# Patient Record
Sex: Female | Born: 1950 | Race: White | Hispanic: No | Marital: Married | Smoking: Former smoker
Health system: Southern US, Community
[De-identification: ages and names within clinical notes are randomized; demographics above are authoritative.]

## PROBLEM LIST (undated history)

## (undated) DIAGNOSIS — I219 Acute myocardial infarction, unspecified: Secondary | ICD-10-CM

## (undated) DIAGNOSIS — I509 Heart failure, unspecified: Secondary | ICD-10-CM

## (undated) DIAGNOSIS — E559 Vitamin D deficiency, unspecified: Secondary | ICD-10-CM

## (undated) DIAGNOSIS — E538 Deficiency of other specified B group vitamins: Secondary | ICD-10-CM

## (undated) DIAGNOSIS — D649 Anemia, unspecified: Secondary | ICD-10-CM

## (undated) DIAGNOSIS — E785 Hyperlipidemia, unspecified: Secondary | ICD-10-CM

## (undated) DIAGNOSIS — T7840XA Allergy, unspecified, initial encounter: Secondary | ICD-10-CM

## (undated) DIAGNOSIS — F419 Anxiety disorder, unspecified: Secondary | ICD-10-CM

## (undated) DIAGNOSIS — M1990 Unspecified osteoarthritis, unspecified site: Secondary | ICD-10-CM

## (undated) DIAGNOSIS — D499 Neoplasm of unspecified behavior of unspecified site: Secondary | ICD-10-CM

## (undated) DIAGNOSIS — I5032 Chronic diastolic (congestive) heart failure: Secondary | ICD-10-CM

## (undated) DIAGNOSIS — G43909 Migraine, unspecified, not intractable, without status migrainosus: Secondary | ICD-10-CM

## (undated) DIAGNOSIS — R011 Cardiac murmur, unspecified: Secondary | ICD-10-CM

## (undated) DIAGNOSIS — G4730 Sleep apnea, unspecified: Secondary | ICD-10-CM

## (undated) DIAGNOSIS — K219 Gastro-esophageal reflux disease without esophagitis: Secondary | ICD-10-CM

## (undated) DIAGNOSIS — K449 Diaphragmatic hernia without obstruction or gangrene: Secondary | ICD-10-CM

## (undated) DIAGNOSIS — E119 Type 2 diabetes mellitus without complications: Secondary | ICD-10-CM

## (undated) DIAGNOSIS — C679 Malignant neoplasm of bladder, unspecified: Secondary | ICD-10-CM

## (undated) DIAGNOSIS — I2510 Atherosclerotic heart disease of native coronary artery without angina pectoris: Secondary | ICD-10-CM

## (undated) DIAGNOSIS — H269 Unspecified cataract: Secondary | ICD-10-CM

## (undated) DIAGNOSIS — I10 Essential (primary) hypertension: Secondary | ICD-10-CM

## (undated) DIAGNOSIS — M7520 Bicipital tendinitis, unspecified shoulder: Secondary | ICD-10-CM

## (undated) DIAGNOSIS — M75100 Unspecified rotator cuff tear or rupture of unspecified shoulder, not specified as traumatic: Secondary | ICD-10-CM

## (undated) DIAGNOSIS — R7989 Other specified abnormal findings of blood chemistry: Secondary | ICD-10-CM

## (undated) HISTORY — DX: Chronic diastolic (congestive) heart failure: I50.32

## (undated) HISTORY — PX: GASTRIC BYPASS: SHX52

## (undated) HISTORY — DX: Hyperlipidemia, unspecified: E78.5

## (undated) HISTORY — PX: DG  BONE DENSITY (ARMC HX): HXRAD1102

## (undated) HISTORY — DX: Malignant neoplasm of bladder, unspecified: C67.9

## (undated) HISTORY — DX: Type 2 diabetes mellitus without complications: E11.9

## (undated) HISTORY — DX: Other specified abnormal findings of blood chemistry: R79.89

## (undated) HISTORY — PX: ENDOMETRIAL ABLATION: SHX621

## (undated) HISTORY — DX: Unspecified rotator cuff tear or rupture of unspecified shoulder, not specified as traumatic: M75.100

## (undated) HISTORY — DX: Anxiety disorder, unspecified: F41.9

## (undated) HISTORY — DX: Bicipital tendinitis, unspecified shoulder: M75.20

## (undated) HISTORY — DX: Migraine, unspecified, not intractable, without status migrainosus: G43.909

## (undated) HISTORY — DX: Neoplasm of unspecified behavior of unspecified site: D49.9

## (undated) HISTORY — DX: Atherosclerotic heart disease of native coronary artery without angina pectoris: I25.10

## (undated) HISTORY — DX: Sleep apnea, unspecified: G47.30

## (undated) HISTORY — PX: MASTECTOMY: SHX3

## (undated) HISTORY — DX: Deficiency of other specified B group vitamins: E53.8

## (undated) HISTORY — PX: OTHER SURGICAL HISTORY: SHX169

## (undated) HISTORY — DX: Unspecified osteoarthritis, unspecified site: M19.90

## (undated) HISTORY — DX: Allergy, unspecified, initial encounter: T78.40XA

## (undated) HISTORY — PX: BREAST SURGERY: SHX581

## (undated) HISTORY — DX: Unspecified cataract: H26.9

## (undated) HISTORY — DX: Anemia, unspecified: D64.9

## (undated) HISTORY — PX: CARDIAC CATHETERIZATION: SHX172

## (undated) HISTORY — DX: Vitamin D deficiency, unspecified: E55.9

## (undated) HISTORY — DX: Diaphragmatic hernia without obstruction or gangrene: K44.9

## (undated) HISTORY — PX: CATARACT EXTRACTION: SUR2

## (undated) HISTORY — DX: Heart failure, unspecified: I50.9

## (undated) HISTORY — DX: Essential (primary) hypertension: I10

## (undated) HISTORY — DX: Gastro-esophageal reflux disease without esophagitis: K21.9

## (undated) HISTORY — DX: Cardiac murmur, unspecified: R01.1

---

## 1977-07-11 HISTORY — PX: CHOLECYSTECTOMY: SHX55

## 1988-07-11 DIAGNOSIS — C50919 Malignant neoplasm of unspecified site of unspecified female breast: Secondary | ICD-10-CM

## 1988-07-11 HISTORY — DX: Malignant neoplasm of unspecified site of unspecified female breast: C50.919

## 2012-07-11 DIAGNOSIS — C679 Malignant neoplasm of bladder, unspecified: Secondary | ICD-10-CM

## 2012-07-11 HISTORY — DX: Malignant neoplasm of bladder, unspecified: C67.9

## 2016-09-28 DIAGNOSIS — E559 Vitamin D deficiency, unspecified: Secondary | ICD-10-CM | POA: Insufficient documentation

## 2016-09-28 DIAGNOSIS — I2510 Atherosclerotic heart disease of native coronary artery without angina pectoris: Secondary | ICD-10-CM | POA: Insufficient documentation

## 2016-09-28 DIAGNOSIS — M75100 Unspecified rotator cuff tear or rupture of unspecified shoulder, not specified as traumatic: Secondary | ICD-10-CM | POA: Insufficient documentation

## 2016-09-28 DIAGNOSIS — E538 Deficiency of other specified B group vitamins: Secondary | ICD-10-CM | POA: Insufficient documentation

## 2016-09-28 DIAGNOSIS — M7520 Bicipital tendinitis, unspecified shoulder: Secondary | ICD-10-CM | POA: Insufficient documentation

## 2016-09-28 DIAGNOSIS — C679 Malignant neoplasm of bladder, unspecified: Secondary | ICD-10-CM | POA: Insufficient documentation

## 2016-09-28 DIAGNOSIS — R7989 Other specified abnormal findings of blood chemistry: Secondary | ICD-10-CM | POA: Insufficient documentation

## 2016-09-28 DIAGNOSIS — G43909 Migraine, unspecified, not intractable, without status migrainosus: Secondary | ICD-10-CM | POA: Insufficient documentation

## 2016-09-28 DIAGNOSIS — E785 Hyperlipidemia, unspecified: Secondary | ICD-10-CM | POA: Insufficient documentation

## 2018-02-19 LAB — PROTIME-INR: INR: 1 (ref 0.9–1.1)

## 2020-11-19 ENCOUNTER — Other Ambulatory Visit (HOSPITAL_COMMUNITY): Payer: Self-pay | Admitting: Obstetrics and Gynecology

## 2020-11-19 ENCOUNTER — Ambulatory Visit (HOSPITAL_COMMUNITY)
Admission: RE | Admit: 2020-11-19 | Discharge: 2020-11-19 | Disposition: A | Discharge status: Home / Self Care | Payer: Medicare PPO | Source: Ambulatory Visit | Attending: Obstetrics and Gynecology | Admitting: Obstetrics and Gynecology

## 2020-11-19 ENCOUNTER — Other Ambulatory Visit: Payer: Self-pay

## 2020-11-19 DIAGNOSIS — R609 Edema, unspecified: Secondary | ICD-10-CM

## 2020-11-19 NOTE — Progress Notes (Signed)
VASCULAR LAB    Left lower extremity venous duplex has been performed.  See CV proc for preliminary results.  Left Tiffany VM  Viliami Bracco, RVT 11/19/2020, 2:56 PM

## 2020-11-22 NOTE — Progress Notes (Deleted)
Cardiology Office Note:    Date:  11/22/2020   ID:  Heather Garrett, DOB March 14, 1951, MRN [id number]  PCP:  Pcp, No   [facility] Providers Cardiologist:  None {   Referring MD: No ref. provider found    History of Present Illness:    Heather Garrett is a 70 y.o. female with a hx of DMII, OSA, prior tobacco use who was previously followed in [facility] who now presents to clinic to establish care for her *****  No past medical history on file.  *** The histories are not reviewed yet. Please review them in the "History" navigator section and refresh this [place].  Current Medications: No outpatient medications have been marked as taking for the 11/25/20 encounter (Appointment) with [name], MD.     Allergies:   Patient has no allergy information on record.   Social History   Socioeconomic History  . Marital status: Married    Spouse name: Not on file  . Number of children: Not on file  . Years of education: Not on file  . Highest education level: Not on file  Occupational History  . Not on file  Tobacco Use  . Smoking status: Not on file  . Smokeless tobacco: Not on file  Substance and Sexual Activity  . Alcohol use: Not on file  . Drug use: Not on file  . Sexual activity: Not on file  Other Topics Concern  . Not on file  Social History Narrative  . Not on file   Social Determinants of Health   Financial Resource Strain: Not on file  Food Insecurity: Not on file  Transportation Needs: Not on file  Physical Activity: Not on file  Stress: Not on file  Social Connections: Not on file     Family History: The patient's ***family history is not on file.  ROS:   Please see the history of present illness.    *** All other systems reviewed and are negative.  EKGs/Labs/Other Studies Reviewed:    The following studies were reviewed today: ***  EKG:  EKG is *** ordered today.  The ekg ordered today demonstrates ***  Recent  Labs: No results found for requested labs within last 8760 hours.  Recent Lipid Panel No results found for: CHOL, TRIG, HDL, CHOLHDL, VLDL, LDLCALC, LDLDIRECT   Risk Assessment/Calculations:   {Does this patient have ATRIAL FIBRILLATION?:[id number]}   Physical Exam:    VS:  There were no vitals taken for this visit.    Wt Readings from Last 3 Encounters:  No data found for Wt     GEN: *** Well nourished, well developed in no acute distress HEENT: Normal NECK: No JVD; No carotid bruits LYMPHATICS: No lymphadenopathy CARDIAC: ***RRR, no murmurs, rubs, gallops RESPIRATORY:  Clear to auscultation without rales, wheezing or rhonchi  ABDOMEN: Soft, non-tender, non-distended MUSCULOSKELETAL:  No edema; No deformity  SKIN: Warm and dry NEUROLOGIC:  Alert and oriented x 3 PSYCHIATRIC:  Normal affect   ASSESSMENT:    No diagnosis found. PLAN:    In order of problems listed above:  1. ***   {Are you ordering a CV Procedure (e.g. stress test, cath, DCCV, TEE, etc)?   Press F2        :[id number]}    Medication Adjustments/Labs and Tests Ordered: Current medicines are reviewed at length with the patient today.  Concerns regarding medicines are outlined above.  No orders of the defined types were placed in this encounter.  No  orders of the defined types were placed in this encounter.   There are no Patient Instructions on file for this visit.   Signed, [name], MD  11/22/2020 4:50 PM    [facility]

## 2020-11-25 ENCOUNTER — Encounter: Payer: Self-pay | Admitting: Cardiology

## 2020-11-25 ENCOUNTER — Ambulatory Visit: Payer: Medicare PPO | Admitting: Cardiology

## 2020-11-25 ENCOUNTER — Other Ambulatory Visit: Payer: Self-pay

## 2020-11-25 VITALS — BP 146/82 | HR 65 | Ht <= 58 in | Wt 114.8 lb

## 2020-11-25 DIAGNOSIS — D649 Anemia, unspecified: Secondary | ICD-10-CM

## 2020-11-25 DIAGNOSIS — I2510 Atherosclerotic heart disease of native coronary artery without angina pectoris: Secondary | ICD-10-CM | POA: Diagnosis not present

## 2020-11-25 DIAGNOSIS — Z79899 Other long term (current) drug therapy: Secondary | ICD-10-CM

## 2020-11-25 DIAGNOSIS — R600 Localized edema: Secondary | ICD-10-CM | POA: Diagnosis not present

## 2020-11-25 DIAGNOSIS — E782 Mixed hyperlipidemia: Secondary | ICD-10-CM

## 2020-11-25 DIAGNOSIS — I10 Essential (primary) hypertension: Secondary | ICD-10-CM

## 2020-11-25 NOTE — Progress Notes (Signed)
Cardiology Office Note:    Date:  11/25/2020   ID:  Heather Garrett, DOB 1950/08/18, MRN [id number]  PCP:  Michael Boston, MD   [facility] Providers Cardiologist:  None {   Referring MD: No ref. provider found    History of Present Illness:    Heather Garrett is a 70 y.o. female with a hx of MI s/p PCI in 2001 (unknown artery), DMII, OSA, prior tobacco use who was previously followed in [facility] who now presents to clinic to establish care her known CAD and current worsening LE edema.  Patient states that she suffered a MI in 2001 s/p PCI to unknown artery. Since that time, she was been followed in [facility] with no recurrence of anginal symptoms. Had recent stress test and TTE which she believes were normal, however, records not brought today. She has no known diagnosis of CHF or hospitalizations for heart failure. Recently, she has been noting worsening bilateral LE edema. LE doppler ultrasound on 11/19/20 negative for DVT. She was switched off her lisinopril-HCTZ and started on lasix 20mg  qod. Despite starting the lasix, she has persistent LE edema.    Otherwise, she complains of intermittent lightheadedness at home. States her blood pressures are usually 120-140s. She had one episode a couple of months ago where her BP was 68/46 and she went to the OSH ED. She was told she was anemic and dehydrated and received IVF with improvement. No records available for this admission.  Currently she denies any pre-syncopal, or syncopal episodes. She denies any exertional chest tightness, pressure, or pain, palpitations. She has no PND or orthopnea.  Past Medical History:  Diagnosis Date  . Biceps tendinitis   . Cobalamin deficiency   . Coronary arteriosclerosis in native artery   . Elevated liver function tests   . Hyperlipidemia   . Migraine   . Rotator cuff syndrome   . 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 Vitamin D deficiency       Current  Medications: Current Meds  Medication Sig  . aspirin 81 MG EC tablet Take 40.5 mg by mouth every other day. Swallow whole.  . B Complex Vitamins (VITAMIN B COMPLEX PO) Take by mouth.  . carvedilol (COREG) 3.125 MG tablet Take 3.125 mg by mouth 2 (two) times daily.  . diclofenac (VOLTAREN) 75 MG EC tablet Take 75 mg by mouth as needed.  . etodolac (LODINE) 500 MG tablet Take 500 mg by mouth as needed.  . ezetimibe-simvastatin (VYTORIN) 10-80 MG tablet Take 1 tablet by mouth daily.  Marland Kitchen omeprazole (PRILOSEC) 20 MG capsule Take 20 mg by mouth daily.  . Probiotic Product (PROBIOTIC PO) Take by mouth.  . sertraline (ZOLOFT) 50 MG tablet Take 50 mg by mouth daily.  . SUMAtriptan (IMITREX) 50 MG tablet Take 50 mg by mouth every 2 (two) hours as needed for migraine. May repeat in 2 hours if headache persists or recurs.  Marland Kitchen tiZANidine (ZANAFLEX) 4 MG capsule Take 4 mg by mouth 3 (three) times daily.  Marland Kitchen topiramate (TOPAMAX) 50 MG tablet Take 50 mg by mouth 2 (two) times daily.  . traZODone (DESYREL) 50 MG tablet Take 50 mg by mouth at bedtime as needed for sleep.     Allergies:   Ciprofloxacin, Contrast media [iodinated diagnostic agents], Erythromycin base, and Sulfabenzamide   Social History   Socioeconomic History  . Marital status: Married    Spouse name: Not on file  . Number of children: Not  on file  . Years of education: Not on file  . Highest education level: Not on file  Occupational History  . Not on file  Tobacco Use  . Smoking status: Former Research scientist (life sciences)  . Smokeless tobacco: Never Used  . Tobacco comment: Quit 30 yrs ago  Substance and Sexual Activity  . Alcohol use: Not on file  . Drug use: Not on file  . Sexual activity: Not on file  Other Topics Concern  . Not on file  Social History Narrative  . Not on file   Social Determinants of Health   Financial Resource Strain: Not on file  Food Insecurity: Not on file  Transportation Needs: Not on file  Physical Activity: Not on  file  Stress: Not on file  Social Connections: Not on file     Family History: The patient's family history is not on file.  ROS:   Review of Systems  Constitutional: Negative for chills, diaphoresis, fever and malaise/fatigue.  HENT: Positive for congestion.   Eyes: Negative for blurred vision.  Respiratory: Negative for cough, shortness of breath and wheezing.   Cardiovascular: Positive for leg swelling (moderate). Negative for chest pain, palpitations, orthopnea and PND.  Gastrointestinal: Negative for abdominal pain, blood in stool, diarrhea, heartburn, nausea and vomiting.  Genitourinary: Negative for dysuria and hematuria.  Musculoskeletal: Positive for joint pain.  Neurological: Negative for dizziness and loss of consciousness.     EKGs/Labs/Other Studies Reviewed:    The following studies were reviewed today: 5/12/22Vascular [place]- Summary:  RIGHT:  - No evidence of common femoral vein obstruction.   LEFT:  - There is no evidence of deep vein thrombosis in the lower extremity.   EKG:   11/25/20-normal sinus rhythm, rate 65  Recent Labs: No results found for requested labs within last 8760 hours.  Recent Lipid Panel No results found for: CHOL, TRIG, HDL, CHOLHDL, VLDL, LDLCALC, LDLDIRECT   Physical Exam:    VS:  BP (!) 146/82   Pulse 65   Ht 4\' 10"  (1.473 m)   Wt 114 lb 12.8 oz (52.1 kg)   SpO2 97%   BMI 23.99 kg/m     Wt Readings from Last 3 Encounters:  11/25/20 114 lb 12.8 oz (52.1 kg)     GEN: Well nourished, well developed in no acute distress HEENT: Normal NECK: No JVD; No carotid bruits LYMPHATICS: No lymphadenopathy CARDIAC: RRR, 1/6 systolic murmur, no rubs or gallops RESPIRATORY:  Clear to auscultation without rales, wheezing or rhonchi  ABDOMEN: Soft, non-tender, non-distended MUSCULOSKELETAL: +2 pitting edema that extend to her chin(L>R) ; No deformity  SKIN: Warm and dry NEUROLOGIC:  Alert and oriented x 3 PSYCHIATRIC:  Normal affect    ASSESSMENT:    1. Coronary artery disease involving native coronary artery of native heart without angina pectoris   2. Anemia, unspecified type   3. Bilateral lower extremity edema   4. Medication management   5. Primary hypertension   6. Mixed hyperlipidemia    PLAN:    In order of problems listed above:  #Known CAD s/p remote MI with PCI in 2001: Per patient report, has followed with Cardiology closely with recent stress testing which was reassuringly normal. Has no anginal symptoms currently.  -Obtain records from Cardiologist; reportedly had TTE 09/2020 -Continue ASA 81mg  daily -Continue coreg 3.125mg  BID -Continue vytorin 10-80mg   -Will add back ACE/ARB as able pending renal function and blood pressure  #LE edema: Patient with progressive LE edema over the past couple of  months. Reportedly had TTE with Cardiologist in 09/2020 (unknown result). Has not been told she has heart failure in the past. Was started on lasix qod which has not helped signficantly. LE venous dopplers negative for DVT. -Obtain records from cardiologist including recent TTE as above -Check BMET and BNP today -Increase lasix to 20mg  daily for now; can adjust pending labs -Compression socks, leg elevation -Low Na diet  #HTN: Elevated today. Having episodes of dizziness however (? Orthostasis). States mainly 120-140s on home cuff. -Continue coreg 3.125mg  BID -Check home BP cuff measurements and add ACE/ARB if able; goal <120s/80s -Monitor for symptoms of orthostasis  #HLD: -Obtain lipids from Cardiologist office -Continue vytorin 10-80mg  daily as above  #Anemia: Patient reports that she was seen in the last couple of months in the ER with anemia and dehydration. No repeat labs in our system. -Check CBC today  Medication Adjustments/Labs and Tests Ordered: Current medicines are reviewed at length with the patient today.  Concerns regarding medicines are outlined above.  Orders Placed This  Encounter  Procedures  . Basic metabolic panel  . Pro b natriuretic peptide  . CBC  . EKG 12-Lead   No orders of the defined types were placed in this encounter.   Patient Instructions  Medication Instructions:   Your physician recommends that you continue on your current medications as directed. Please refer to the Current Medication list given to you today.  *If you need a refill on your cardiac medications before your next appointment, please call your pharmacy*   Lab Work:  TODAY--BMET, PRO-BNP, AND CBC  If you have labs (blood work) drawn today and your tests are completely normal, you will receive your results only by: Marland Kitchen MyChart Message (if you have MyChart) OR . A paper copy in the mail If you have any lab test that is abnormal or we need to change your treatment, we will call you to review the results.   Follow-Up:  2 MONTHS WITH AN EXTENDER IN THE OFFICE PER DR. Johney Frame        I,Alexis Bryant,acting as a scribe for [name], MD.,have documented all relevant documentation on the behalf of [name], MD,as directed by  [name], MD while in the presence of [name], MD.  I, [name], MD, have reviewed all documentation for this visit. The documentation on 11/25/20 for the exam, diagnosis, procedures, and orders are all accurate and complete. Signed, [name], MD  11/25/2020 12:44 PM    [facility]

## 2020-11-25 NOTE — Patient Instructions (Signed)
Medication Instructions:   Your physician recommends that you continue on your current medications as directed. Please refer to the Current Medication list given to you today.  *If you need a refill on your cardiac medications before your next appointment, please call your pharmacy*   Lab Work:  TODAY--BMET, PRO-BNP, AND CBC  If you have labs (blood work) drawn today and your tests are completely normal, you will receive your results only by: Marland Kitchen MyChart Message (if you have MyChart) OR . A paper copy in the mail If you have any lab test that is abnormal or we need to change your treatment, we will call you to review the results.   Follow-Up:  2 MONTHS WITH AN EXTENDER IN THE OFFICE PER DR. Johney Frame

## 2020-11-26 ENCOUNTER — Telehealth: Payer: Self-pay | Admitting: *Deleted

## 2020-11-26 DIAGNOSIS — Z79899 Other long term (current) drug therapy: Secondary | ICD-10-CM

## 2020-11-26 DIAGNOSIS — I2510 Atherosclerotic heart disease of native coronary artery without angina pectoris: Secondary | ICD-10-CM

## 2020-11-26 DIAGNOSIS — R7989 Other specified abnormal findings of blood chemistry: Secondary | ICD-10-CM

## 2020-11-26 LAB — BASIC METABOLIC PANEL
BUN/Creatinine Ratio: 15 (ref 12–28)
BUN: 12 mg/dL (ref 8–27)
CO2: 19 mmol/L — ABNORMAL LOW (ref 20–29)
Calcium: 8.6 mg/dL — ABNORMAL LOW (ref 8.7–10.3)
Chloride: 103 mmol/L (ref 96–106)
Creatinine, Ser: 0.78 mg/dL (ref 0.57–1.00)
Glucose: 104 mg/dL — ABNORMAL HIGH (ref 65–99)
Potassium: 4.4 mmol/L (ref 3.5–5.2)
Sodium: 136 mmol/L (ref 134–144)
eGFR: 82 mL/min/{1.73_m2} (ref >59)

## 2020-11-26 LAB — CBC
Hematocrit: 33.9 % — ABNORMAL LOW (ref 34.0–46.6)
Hemoglobin: 10.9 g/dL — ABNORMAL LOW (ref 11.1–15.9)
MCH: 31.2 pg (ref 26.6–33.0)
MCHC: 32.2 g/dL (ref 31.5–35.7)
MCV: 97 fL (ref 79–97)
Platelets: 180 10*3/uL (ref 150–450)
RBC: 3.49 x10E6/uL — ABNORMAL LOW (ref 3.77–5.28)
RDW: 14.2 % (ref 11.7–15.4)
WBC: 5.6 10*3/uL (ref 3.4–10.8)

## 2020-11-26 LAB — PRO B NATRIURETIC PEPTIDE: NT-Pro BNP: 1702 pg/mL — ABNORMAL HIGH (ref 0–301)

## 2020-11-26 MED ORDER — FUROSEMIDE 20 MG PO TABS: ORAL_TABLET | ORAL | 0 refills | Status: DC

## 2020-11-26 NOTE — Telephone Encounter (Signed)
Spoke with the Heather Garrett and endorsed to her, her lab results and recommendations per Dr. Johney Frame.  Informed the Heather Garrett that Dr. Johney Frame would like for her to increase her lasix to 40 mg po daily for 7 days only, then take 20 mg po daily thereafter.  Informed her that we need to bring her back in for lab in 10 days, to recheck a BMET.   Also informed the Heather Garrett that per Dr. Johney Frame, she is mildly anemic, which she has a history of, and she should get in with her PCP for further follow-up of this.  Confirmed the pharmacy of choice with the Heather Garrett.  Scheduled the Heather Garrett a repeat BMET on 12/08/20.  Per the Heather Garrett, she is requesting our female lab tech to draw her blood at that time.  She states when she had her previous lab drawn yesterday, female phlebotomist wrapped her arm too tight, causing a hematoma on her left AC. She states it is still very bruised in the area, but the swelling has gone down.  She states there is no obvious redness, streaking, or warmth in the area of her blood draw.  She is using ice as needed to help with the soreness.  She has full function of left extremity, and there is no obvious discoloration.  She would like for our female tech to draw her blood on 5/31, if possible.  Informed the Heather Garrett that I will request that in the lab appt notes.  Heather Garrett appreciative for this.  Heather Garrett verbalized understanding and agrees with this   Off note:  Records from [facility] received and handed to Dr. Johney Frame to review.

## 2020-11-26 NOTE — Telephone Encounter (Signed)
-----   Message from [name], MD sent at 11/26/2020  2:09 PM EDT ----- Can we increase her lasix to 40mg  daily for 7 days and then 20mg  daily thereafter? Repeat BMET in 10 days.   Otherwise her electrolytes and kidney function look good. She is mildly anemic and needs to follow-up with her PCP.   Did medical records ever get her documents from [facility]?

## 2020-11-27 NOTE — Telephone Encounter (Signed)
Below is where pt reported bruising to arm from previous blood draw done by our office on 5/18.  She is now mycharting  follow-up pictures to Dr. Johney Frame and myself to review.  Dr. Johney Frame did review picture and pt complaint of bruising to her left AC.  Called the pt this morning to follow-up on how her arm is since talking yesterday, and she said there is still bruising to her left AC, swelling and knot is slightly improved, but no pain in that area. Pt is on ASA daily. She still reports that there is NO redness, streaking, discoloration to left arm, hand, and fingers, and she is still able to bend her left arm with NO difficulty.  She states she has no pain in that arm.  She replies she has full function of her left arm.  She denies any chest pain, sob, DOE, or any other cardiac complaints.  Informed the pt that Dr. Johney Frame wants her to continue icing the area, elevating it at rest, and do NOT provide any more compression.  Advised the pt to keep an eye on her arm, and report any concerning symptoms immediately.  Pt education provided on alarming symptoms to look for and report.  Informed the pt that I will make our Nurse Supervisor aware of this issue.  Pt verbalized understanding and agrees with this plan.  Pt will have follow-up labs in our office 5/31, and already pre-planned when speaking to the pt yesterday, for our female Phlebotomist to draw her lab that day, and to draw from her hand only, as pt requested.  Pt still agrees to have lab drawn on that day.    Result note from 11/26/20: Spoke with the pt and endorsed to her, her lab results and recommendations per Dr. Johney Frame.  Informed the pt that Dr. Johney Frame would like for her to increase her lasix to 40 mg po daily for 7 days only, then take 20 mg po daily thereafter. Informed her that we need to bring her back in for lab in 10 days, to recheck a BMET.  Also informed the pt that per Dr. Johney Frame, she is mildly anemic, which she has a  history of, and she should get in with her PCP for further follow-up of this.  Confirmed the pharmacy of choice with the pt.  Scheduled the pt a repeat BMET on 12/08/20. Per the pt, she is requesting our female lab tech to draw her blood at that time.  She states when she had her previous lab drawn yesterday, female phlebotomist wrapped her arm too tight, causing a hematoma on her left AC. She states it is still very bruised in the area, but the swelling has gone down. She states there is no obvious redness, streaking, or warmth in the area of her blood draw. She is using ice as needed to help with the soreness. She has full function of left extremity, and there is no obvious discoloration. She would like for our female tech to draw her blood on 5/31, if possible. Informed the pt that I will request that in the lab appt notes. Pt appreciative for this.  Pt verbalized understanding and agrees with this

## 2020-12-08 ENCOUNTER — Other Ambulatory Visit: Payer: Medicare PPO | Admitting: *Deleted

## 2020-12-08 ENCOUNTER — Other Ambulatory Visit: Payer: Self-pay

## 2020-12-08 DIAGNOSIS — I2510 Atherosclerotic heart disease of native coronary artery without angina pectoris: Secondary | ICD-10-CM

## 2020-12-08 DIAGNOSIS — Z79899 Other long term (current) drug therapy: Secondary | ICD-10-CM

## 2020-12-08 DIAGNOSIS — R7989 Other specified abnormal findings of blood chemistry: Secondary | ICD-10-CM

## 2020-12-08 LAB — BASIC METABOLIC PANEL
BUN/Creatinine Ratio: 9 — ABNORMAL LOW (ref 12–28)
BUN: 8 mg/dL (ref 8–27)
CO2: 21 mmol/L (ref 20–29)
Calcium: 8.9 mg/dL (ref 8.7–10.3)
Chloride: 103 mmol/L (ref 96–106)
Creatinine, Ser: 0.85 mg/dL (ref 0.57–1.00)
Glucose: 96 mg/dL (ref 65–99)
Potassium: 4.1 mmol/L (ref 3.5–5.2)
Sodium: 142 mmol/L (ref 134–144)
eGFR: 74 mL/min/{1.73_m2} (ref >59)

## 2020-12-21 ENCOUNTER — Other Ambulatory Visit: Payer: Self-pay | Admitting: Internal Medicine

## 2020-12-21 DIAGNOSIS — R9389 Abnormal findings on diagnostic imaging of other specified body structures: Secondary | ICD-10-CM

## 2021-01-02 ENCOUNTER — Ambulatory Visit
Admission: RE | Admit: 2021-01-02 | Discharge: 2021-01-02 | Disposition: A | Discharge status: Home / Self Care | Payer: Medicare PPO | Source: Ambulatory Visit | Attending: Internal Medicine | Admitting: Internal Medicine

## 2021-01-02 ENCOUNTER — Other Ambulatory Visit: Payer: Self-pay

## 2021-01-02 DIAGNOSIS — R9389 Abnormal findings on diagnostic imaging of other specified body structures: Secondary | ICD-10-CM

## 2021-01-02 IMAGING — CT CT CHEST W/O CM
1 series · 15 of 34 positions shown, 19 images · non-contrast
Comparison: None.

CLINICAL DATA: Pulmonary nodule, breast cancer status post
mastectomy [IJ], renal cell carcinoma status post right partial
nephrectomy [IJ]

EXAM:
CT CHEST WITHOUT CONTRAST
TECHNIQUE: Multidetector CT imaging of the chest was performed following the
standard protocol without IV contrast.

[Series 2: chest w/(date) · axial · 0.61mm/px · z∈[+754,+988]mm · 15 of 139 slices shown, 19 images]
[im 11/139  mediastinal]
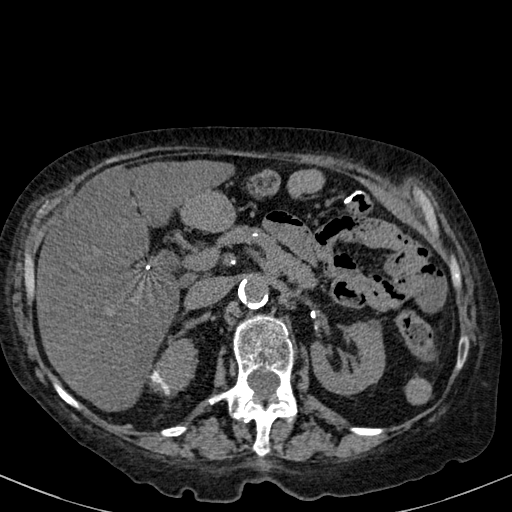
[im 11/139  lung]
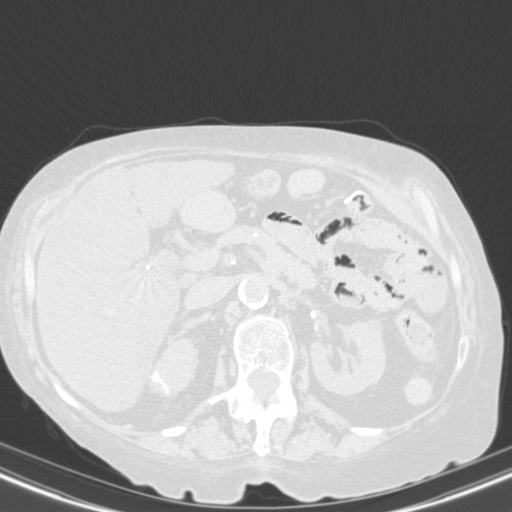
[im 21/139  lung]
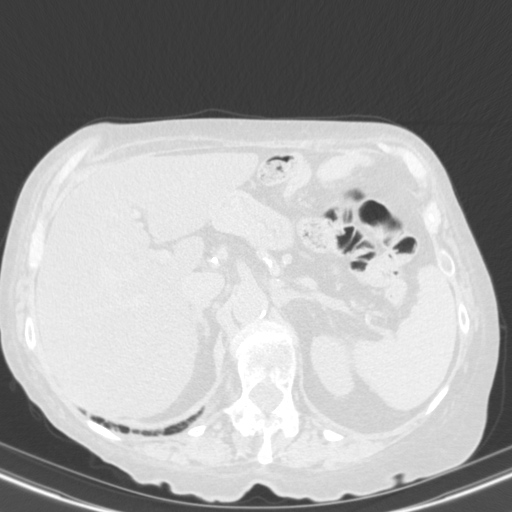
[im 28/139  lung]
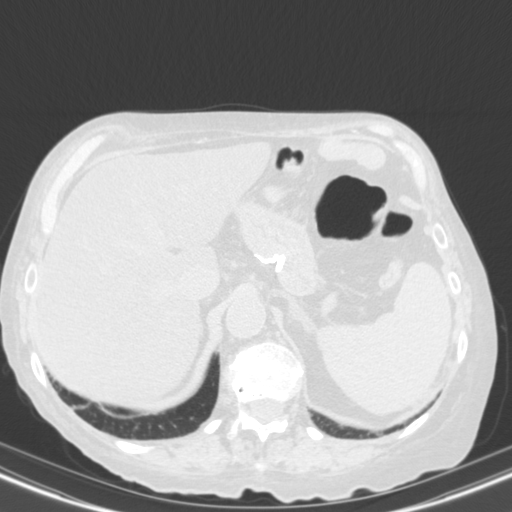
[im 36/139  lung]
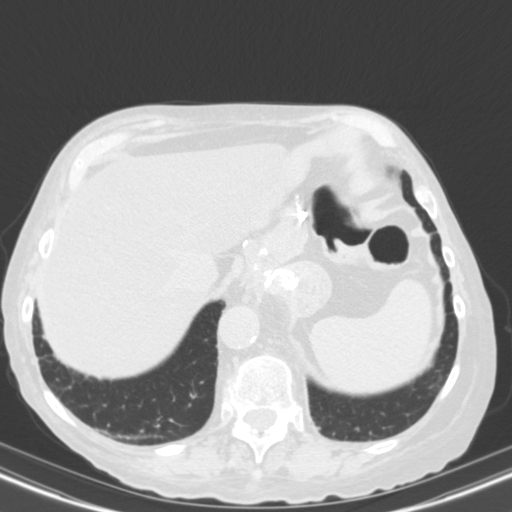
[im 47/139  mediastinal]
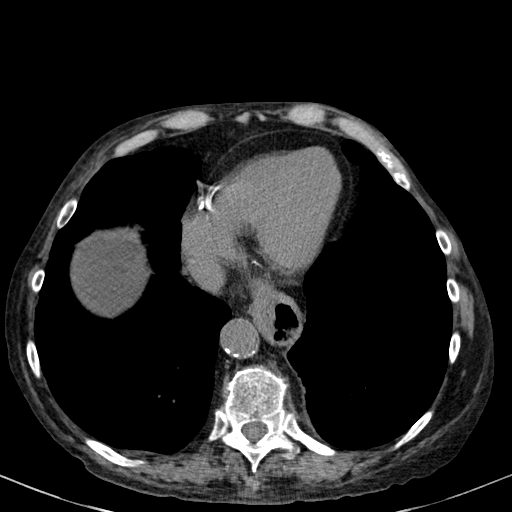
[im 47/139  lung]
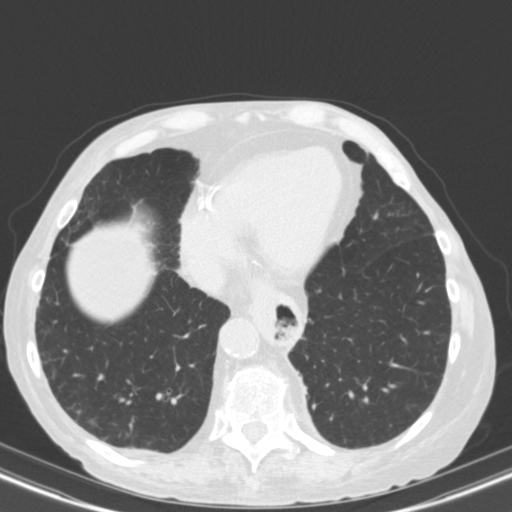
[im 56/139  lung]
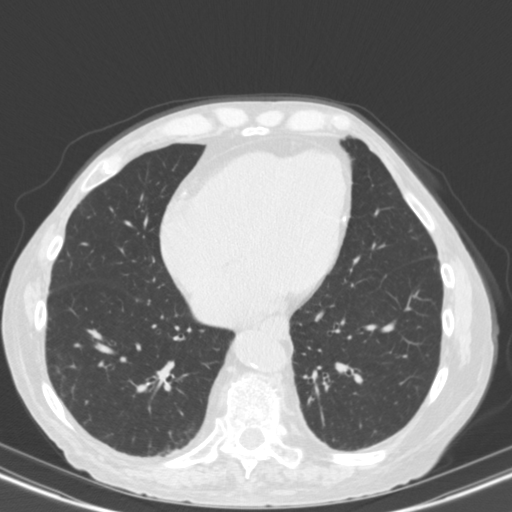
[im 62/139  lung]
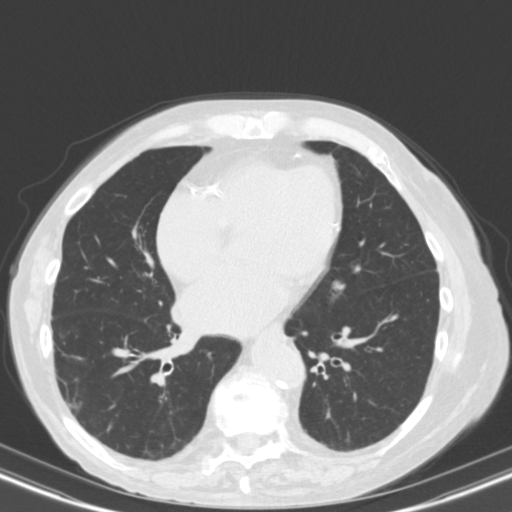
[im 72/139  lung]
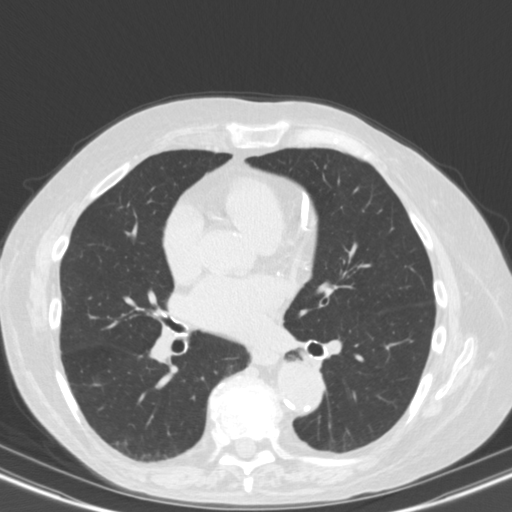
[im 77/139  mediastinal]
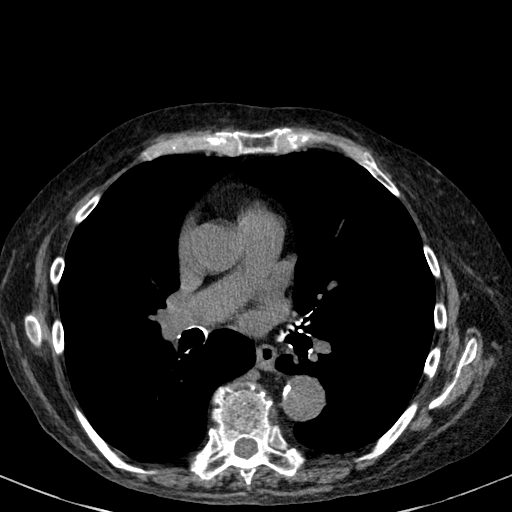
[im 77/139  lung]
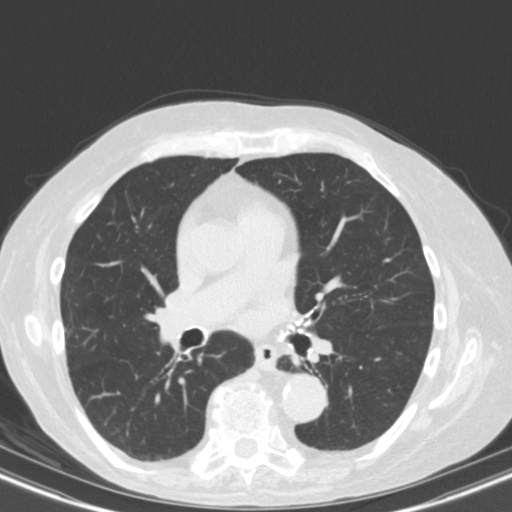
[im 83/139  lung]
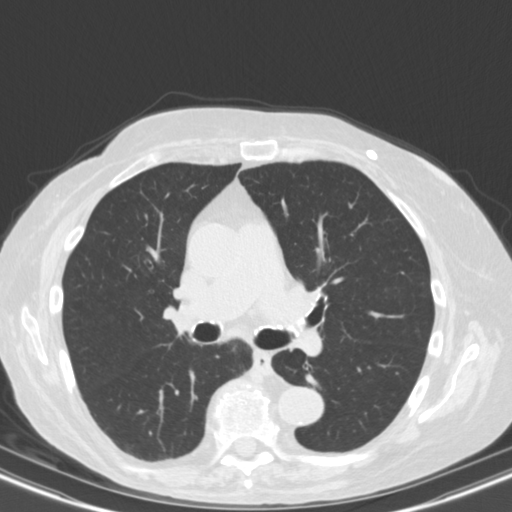
[im 93/139  lung]
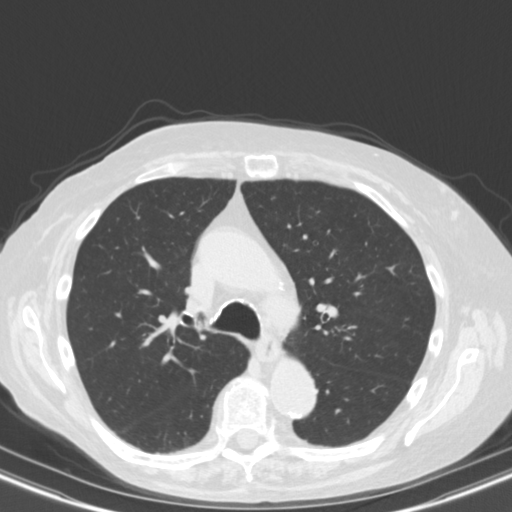
[im 103/139  lung]
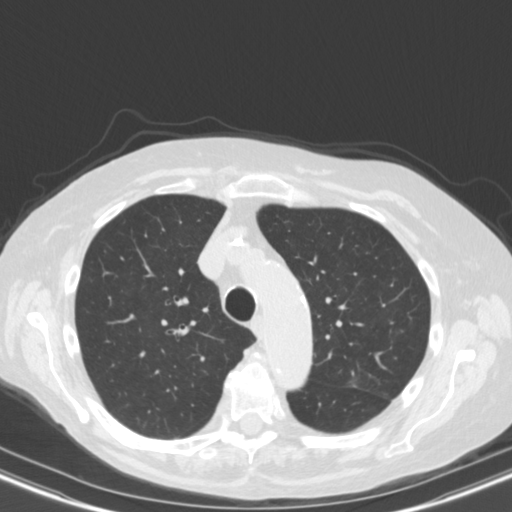
[im 111/139  mediastinal]
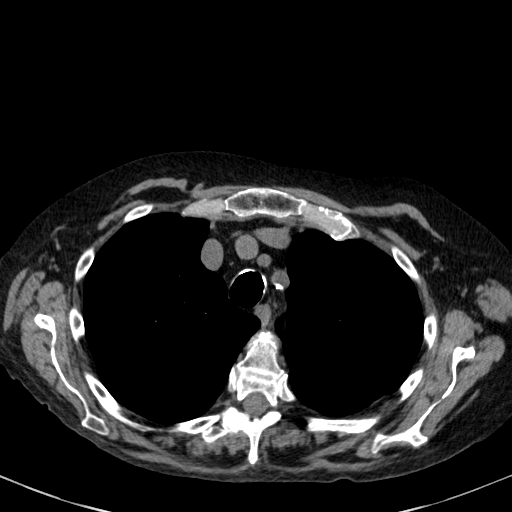
[im 111/139  lung]
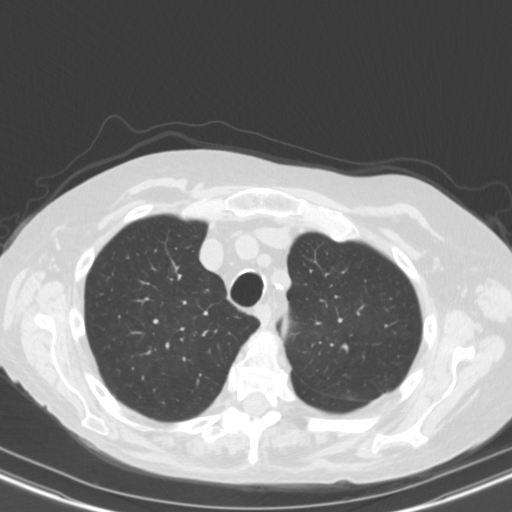
[im 118/139  lung]
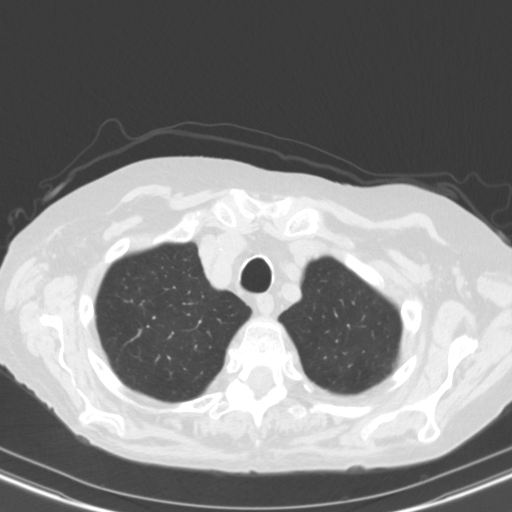
[im 128/139  lung]
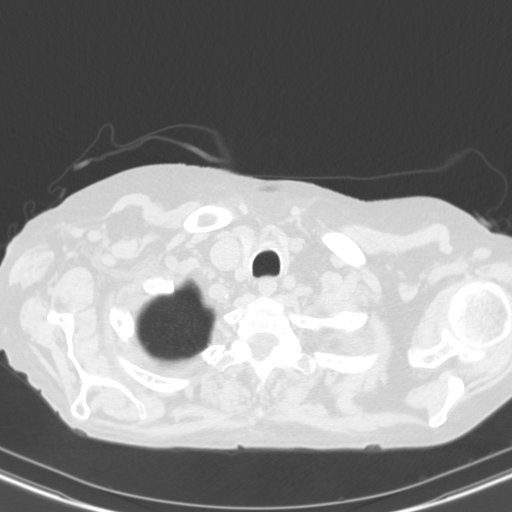

[15 of 34 positions shown; findings below may reference images not displayed]

FINDINGS: Cardiovascular: Extensive multi-vessel coronary artery
calcification. Global cardiac size within normal limits. No
pericardial effusion. The central pulmonary arteries are mildly
enlarged in keeping with changes of pulmonary arterial hypertension.
Moderate atherosclerotic calcification within the thoracic aorta. No
aortic aneurysm.

Mediastinum/Nodes: The visualized thyroid is unremarkable. No
pathologic thoracic adenopathy. Surgical changes of Roux-en-Y
gastric bypass are identified with herniation of the gastric pouch
into the thoracic compartment. The esophagus is unremarkable.

Lungs/Pleura: A 4 mm ground-glass pulmonary nodule is seen within
the superior segment of the left lower lobe, axial image # 42/5. 3
mm nodule within the a medial right lung base, axial image # 96,
demonstrates a benign calcification pattern and is compatible with a
tiny granuloma. Mild subpleural reticulation is seen within the
right lower lobe with parenchymal banding most in keeping with mild
fibrotic change. No other focal pulmonary nodules or infiltrates. No
pneumothorax or pleural effusion. The central airways are widely
patent.

Upper Abdomen: At least moderate hepatic steatosis. Cholecystectomy
has been performed. Partial resection of the upper pole the right
kidney noted. No acute abnormality.

Musculoskeletal: No lytic or blastic bone lesion. No acute bone
abnormality.
IMPRESSION: Extensive multi-vessel coronary artery calcification.

Pulmonary arterial enlargement in keeping with pulmonary artery
hypertension.

4 mm ground-glass pulmonary nodule within the left lower lobe. No
follow-up recommended. This recommendation follows the consensus
statement: Guidelines for Management of Incidental Pulmonary Nodules
Detected on CT Images: From the [HOSPITAL] [IJ]; Radiology
[IJ]; [DATE].

Moderate hepatic steatosis.

Aortic Atherosclerosis ([IJ]-[IJ]).

## 2021-01-13 ENCOUNTER — Ambulatory Visit (HOSPITAL_COMMUNITY)
Admission: RE | Admit: 2021-01-13 | Discharge: 2021-01-13 | Disposition: A | Discharge status: Home / Self Care | Payer: Medicare PPO | Source: Ambulatory Visit | Attending: Internal Medicine | Admitting: Internal Medicine

## 2021-01-13 ENCOUNTER — Other Ambulatory Visit: Payer: Self-pay | Admitting: Internal Medicine

## 2021-01-13 ENCOUNTER — Other Ambulatory Visit (HOSPITAL_COMMUNITY): Payer: Self-pay | Admitting: Internal Medicine

## 2021-01-13 ENCOUNTER — Encounter (HOSPITAL_COMMUNITY): Payer: Self-pay

## 2021-01-13 DIAGNOSIS — G4452 New daily persistent headache (NDPH): Secondary | ICD-10-CM

## 2021-01-13 NOTE — Progress Notes (Signed)
Pt called and cx exam. Pt states she had an MRI 4 months ago after talking with her daughter and does not need exam.

## 2021-01-18 ENCOUNTER — Other Ambulatory Visit: Payer: Self-pay

## 2021-01-18 ENCOUNTER — Ambulatory Visit (HOSPITAL_COMMUNITY)
Admission: RE | Admit: 2021-01-18 | Discharge: 2021-01-18 | Disposition: A | Discharge status: Home / Self Care | Payer: Medicare PPO | Source: Ambulatory Visit | Attending: Internal Medicine | Admitting: Internal Medicine

## 2021-01-18 DIAGNOSIS — G4452 New daily persistent headache (NDPH): Secondary | ICD-10-CM

## 2021-01-18 IMAGING — MR MR HEAD W/O CM
10 series · 48 of 48 positions shown · non-contrast
Comparison: No pertinent prior exams available for comparison.

CLINICAL DATA: Provided history: New daily persistent headache.
Additional history provided: Patient reports persistent posterior
headaches, remote history of breast cancer, remote history of renal
cell carcinoma.

EXAM:
MRI HEAD WITHOUT CONTRAST
TECHNIQUE: Multiplanar, multiecho pulse sequences of the brain and surrounding
structures were obtained without intravenous contrast.

[Series 5: DWI · axial · 3.0mm · 1.36mm/px · z∈[-36,+114]mm · 10 of 104 slices shown (1 of 2)]
[im 1/104]
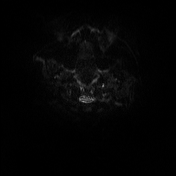
[im 12/104]
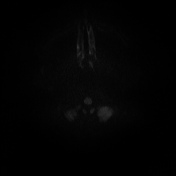
[im 23/104]
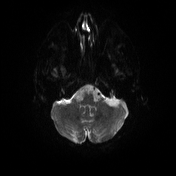
[im 35/104]
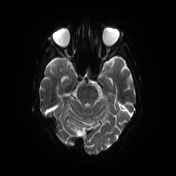
[im 46/104]
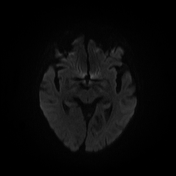
[im 58/104]
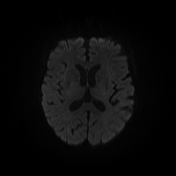
[im 69/104]
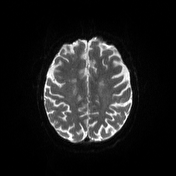
[im 81/104]
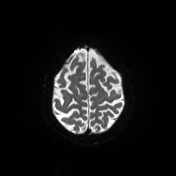
[im 92/104]
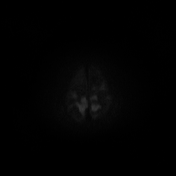
[im 104/104]
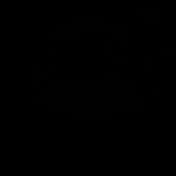

[Series 6: DWI · axial · 3.0mm · 1.36mm/px · z∈[-36,+109]mm · 4 of 50 slices shown (2 of 2)]
[im 1/50]
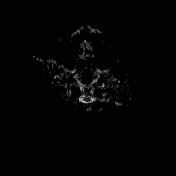
[im 17/50]
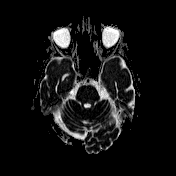
[im 33/50]
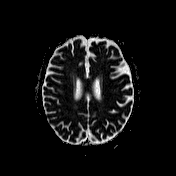
[im 50/50]
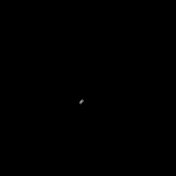

[Series 7: T1 · sagittal · 5.0mm · 0.75mm/px · 2 of 24 slices shown (1 of 2)]
[im 1/24]
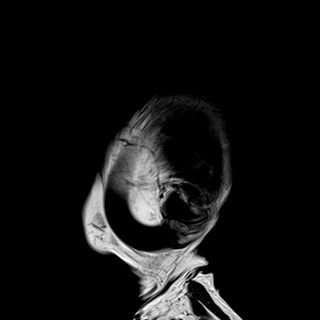
[im 24/24]
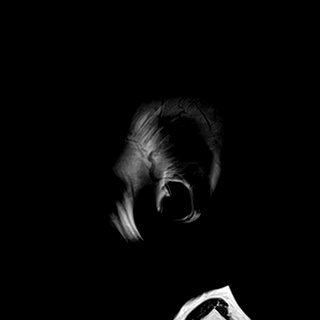

[Series 8: T2 · axial · 5.0mm · 0.62mm/px · z∈[-40,+120]mm · 2 of 26 slices shown (1 of 2)]
[im 1/26]
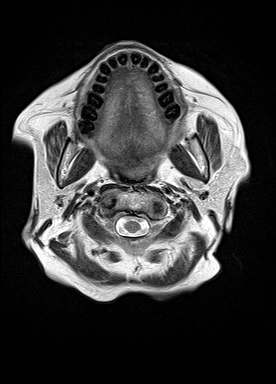
[im 26/26]
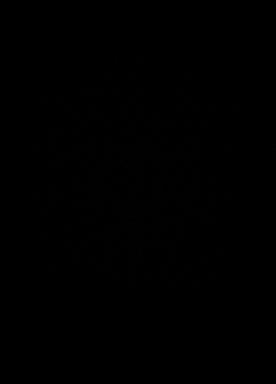

[Series 9: swi_images · axial · 3.0mm · 0.75mm/px · z∈[-36,+115]mm · 4 of 52 slices shown]
[im 1/52]
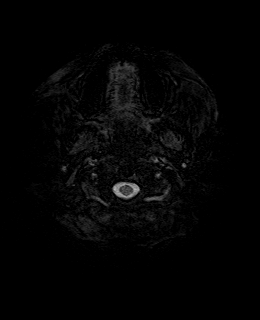
[im 18/52]
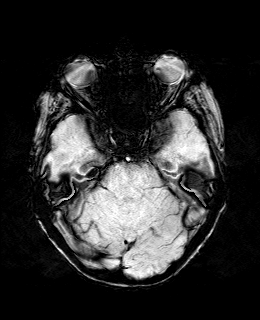
[im 35/52]
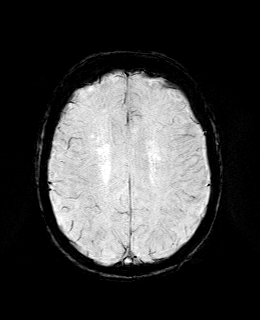
[im 52/52]
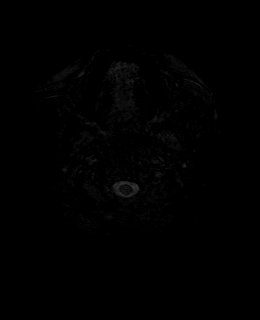

[Series 11: FLAIR · axial · 3.0mm · 0.75mm/px · z∈[-35,+115]mm · 4 of 52 slices shown]
[im 1/52]
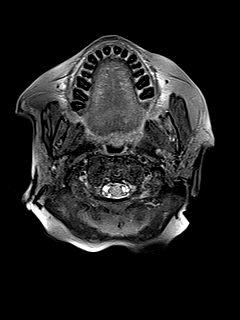
[im 18/52]
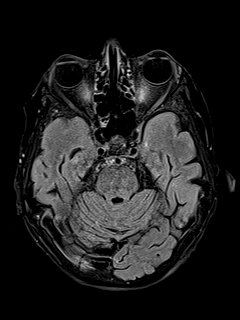
[im 35/52]
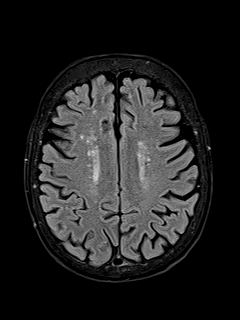
[im 52/52]
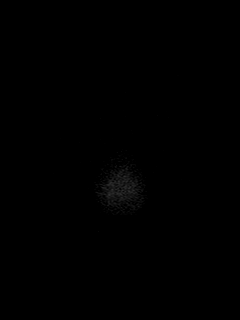

[Series 12: T1 · axial · 1.0mm · 0.94mm/px · z∈[-36,+120]mm · 13 of 160 slices shown (2 of 2)]
[im 1/160]
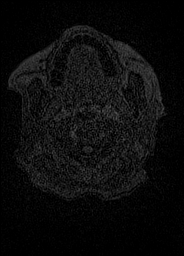
[im 14/160]
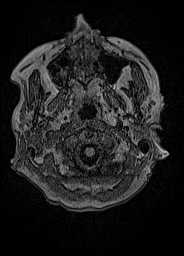
[im 27/160]
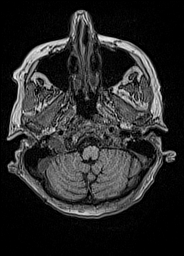
[im 40/160]
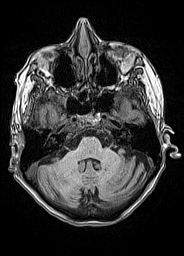
[im 54/160]
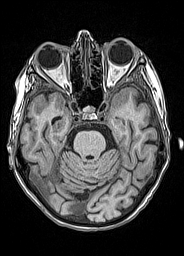
[im 67/160]
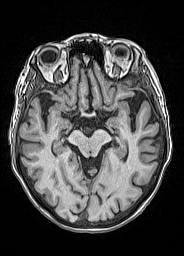
[im 80/160]
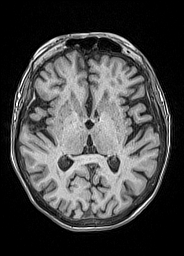
[im 93/160]
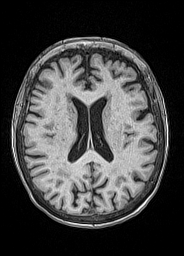
[im 107/160]
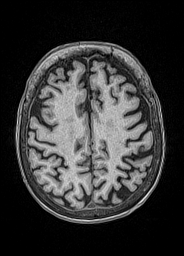
[im 120/160]
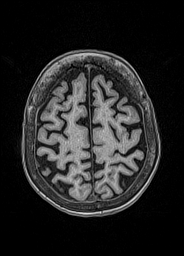
[im 133/160]
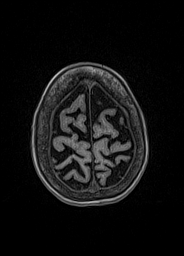
[im 146/160]
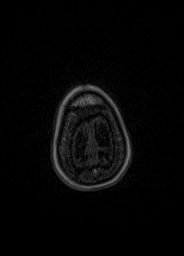
[im 160/160]
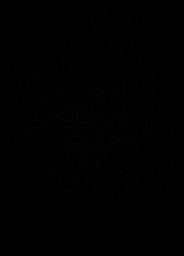

[Series 13: cor dwi_tracew · coronal · 5.0mm · 1.53mm/px · 4 of 52 slices shown]
[im 1/52]
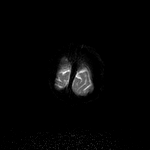
[im 18/52]
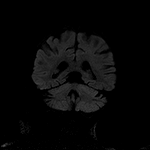
[im 35/52]
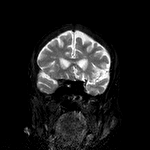
[im 52/52]
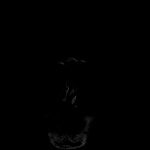

[Series 14: cor dwi_adc · coronal · 5.0mm · 1.53mm/px · 2 of 23 slices shown]
[im 1/23]
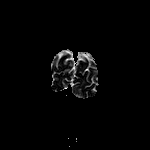
[im 23/23]
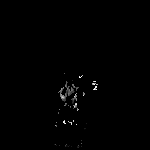

[Series 15: T2 · coronal · 5.0mm · 0.57mm/px · 3 of 34 slices shown (2 of 2)]
[im 1/34]
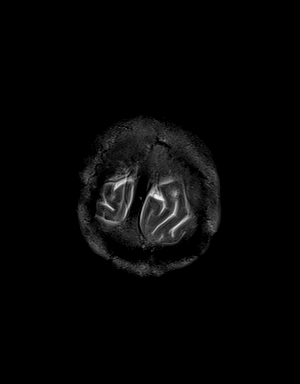
[im 17/34]
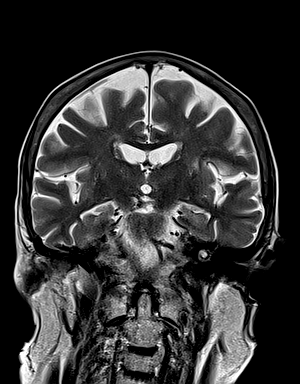
[im 34/34]
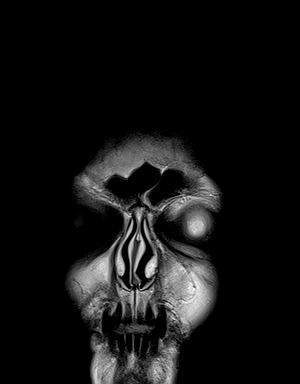

[48 of 48 positions shown; findings below may reference images not displayed]

FINDINGS: Brain:

Mild-to-moderate generalized cerebral and cerebellar atrophy.

Mild-to-moderate multifocal T2/FLAIR hyperintensity within the
cerebral white matter, nonspecific but compatible with chronic small
vessel ischemic disease. To a lesser degree, chronic small vessel
ischemic changes are also present within the pons.

There is no acute infarct.

No evidence of an intracranial mass.

No chronic intracranial blood products.

No extra-axial fluid collection.

No midline shift.

Vascular: Expected proximal arterial flow voids.

Skull and upper cervical spine: No focal marrow lesion. Incompletely
assessed cervical spondylosis.

Sinuses/Orbits: Visualized orbits show no acute finding. Bilateral
lens replacements. No significant paranasal sinus disease.
IMPRESSION: No evidence of acute intracranial abnormality.

Mild-to-moderate chronic small vessel ischemic changes within the
cerebral white matter, and to a lesser degree within the pons.

Mild-to-moderate generalized parenchymal atrophy.

## 2021-01-26 ENCOUNTER — Other Ambulatory Visit: Payer: Self-pay

## 2021-01-26 ENCOUNTER — Encounter (HOSPITAL_COMMUNITY): Payer: Self-pay

## 2021-01-26 ENCOUNTER — Emergency Department (HOSPITAL_COMMUNITY): Payer: Medicare PPO

## 2021-01-26 ENCOUNTER — Emergency Department (HOSPITAL_COMMUNITY)
Admission: EM | Admit: 2021-01-26 | Discharge: 2021-01-26 | Disposition: A | Discharge status: Home / Self Care | Payer: Medicare PPO | Attending: Emergency Medicine | Admitting: Emergency Medicine

## 2021-01-26 DIAGNOSIS — I2510 Atherosclerotic heart disease of native coronary artery without angina pectoris: Secondary | ICD-10-CM | POA: Insufficient documentation

## 2021-01-26 DIAGNOSIS — Z87891 Personal history of nicotine dependence: Secondary | ICD-10-CM | POA: Diagnosis not present

## 2021-01-26 DIAGNOSIS — Z7982 Long term (current) use of aspirin: Secondary | ICD-10-CM | POA: Insufficient documentation

## 2021-01-26 DIAGNOSIS — R1013 Epigastric pain: Secondary | ICD-10-CM

## 2021-01-26 DIAGNOSIS — N390 Urinary tract infection, site not specified: Secondary | ICD-10-CM

## 2021-01-26 LAB — URINALYSIS, ROUTINE W REFLEX MICROSCOPIC
Bilirubin Urine: NEGATIVE
Glucose, UA: NEGATIVE mg/dL
Hgb urine dipstick: NEGATIVE
Ketones, ur: 5 mg/dL — AB
Nitrite: NEGATIVE
Protein, ur: NEGATIVE mg/dL
Specific Gravity, Urine: 1.013 (ref 1.005–1.030)
pH: 7 (ref 5.0–8.0)

## 2021-01-26 LAB — CBC WITH DIFFERENTIAL/PLATELET
Abs Immature Granulocytes: 0.03 10*3/uL (ref 0.00–0.07)
Basophils Absolute: 0 10*3/uL (ref 0.0–0.1)
Basophils Relative: 1 %
Eosinophils Absolute: 0.1 10*3/uL (ref 0.0–0.5)
Eosinophils Relative: 2 %
HCT: 36.5 % (ref 36.0–46.0)
Hemoglobin: 11.9 g/dL — ABNORMAL LOW (ref 12.0–15.0)
Immature Granulocytes: 0 %
Lymphocytes Relative: 18 %
Lymphs Abs: 1.5 10*3/uL (ref 0.7–4.0)
MCH: 33.4 pg (ref 26.0–34.0)
MCHC: 32.6 g/dL (ref 30.0–36.0)
MCV: 102.5 fL — ABNORMAL HIGH (ref 80.0–100.0)
Monocytes Absolute: 1 10*3/uL (ref 0.1–1.0)
Monocytes Relative: 12 %
Neutro Abs: 5.5 10*3/uL (ref 1.7–7.7)
Neutrophils Relative %: 67 %
Platelets: 203 10*3/uL (ref 150–400)
RBC: 3.56 MIL/uL — ABNORMAL LOW (ref 3.87–5.11)
RDW: 15.7 % — ABNORMAL HIGH (ref 11.5–15.5)
WBC: 8.1 10*3/uL (ref 4.0–10.5)
nRBC: 0 % (ref 0.0–0.2)

## 2021-01-26 LAB — LIPASE, BLOOD: Lipase: 29 U/L (ref 11–51)

## 2021-01-26 LAB — COMPREHENSIVE METABOLIC PANEL
ALT: 28 U/L (ref 0–44)
AST: 47 U/L — ABNORMAL HIGH (ref 15–41)
Albumin: 2.9 g/dL — ABNORMAL LOW (ref 3.5–5.0)
Alkaline Phosphatase: 262 U/L — ABNORMAL HIGH (ref 38–126)
Anion gap: 8 (ref 5–15)
BUN: 13 mg/dL (ref 8–23)
CO2: 24 mmol/L (ref 22–32)
Calcium: 8.9 mg/dL (ref 8.9–10.3)
Chloride: 106 mmol/L (ref 98–111)
Creatinine, Ser: 0.74 mg/dL (ref 0.44–1.00)
GFR, Estimated: >60 mL/min (ref >60)
Glucose, Bld: 88 mg/dL (ref 70–99)
Potassium: 4.7 mmol/L (ref 3.5–5.1)
Sodium: 138 mmol/L (ref 135–145)
Total Bilirubin: 1 mg/dL (ref 0.3–1.2)
Total Protein: 6.3 g/dL — ABNORMAL LOW (ref 6.5–8.1)

## 2021-01-26 IMAGING — CT CT ABD-PELV W/O CM
2 of 4 series · 17 of 46 positions shown, 19 images · non-contrast
Comparison: CT chest [DATE]

CLINICAL DATA: Hiatal hernia month ago.  Abdominal pain.

EXAM:
CT ABDOMEN AND PELVIS WITHOUT CONTRAST
TECHNIQUE: Multidetector CT imaging of the abdomen and pelvis was performed
following the standard protocol without IV contrast.

[Series 2: axial st · axial · 0.73mm/px · z∈[+1072,+1417]mm · 14 of 79 slices shown, 16 images]
[im 5/79  soft-tissue]
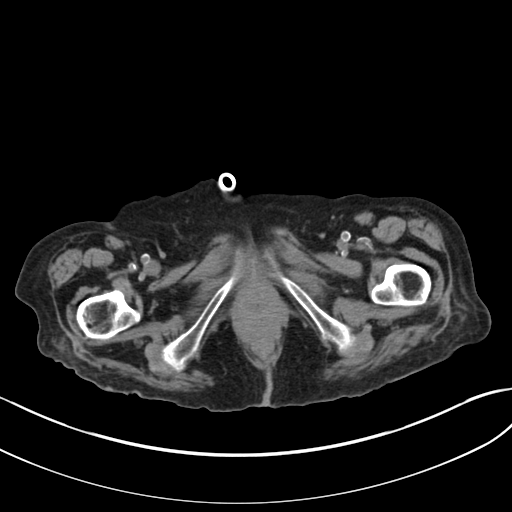
[im 5/79  bone]
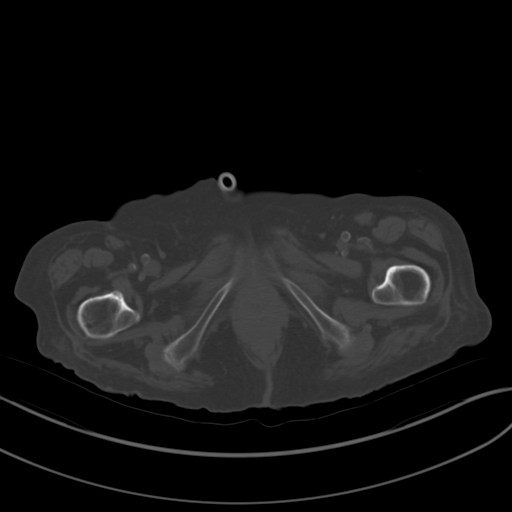
[im 10/79  soft-tissue]
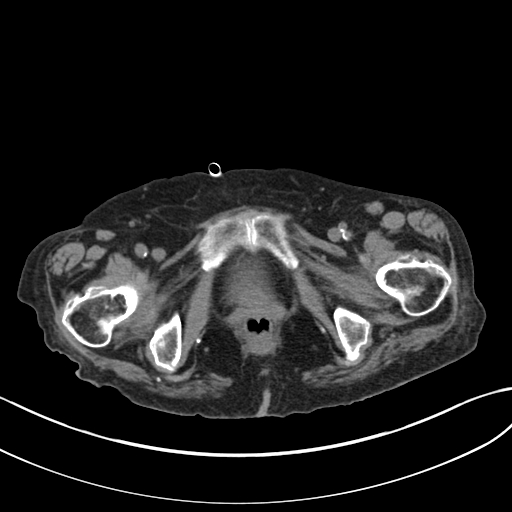
[im 15/79  soft-tissue]
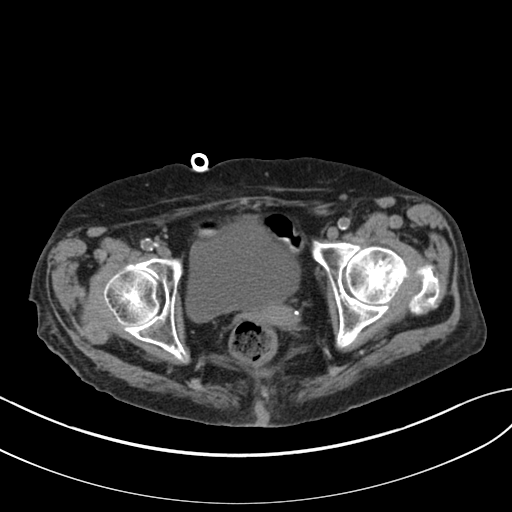
[im 20/79  soft-tissue]
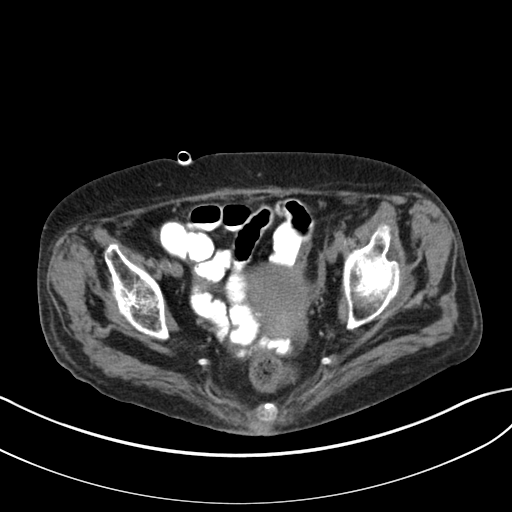
[im 25/79  soft-tissue]
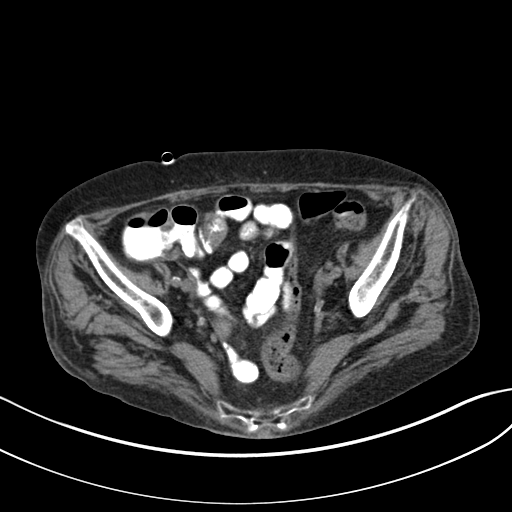
[im 30/79  soft-tissue]
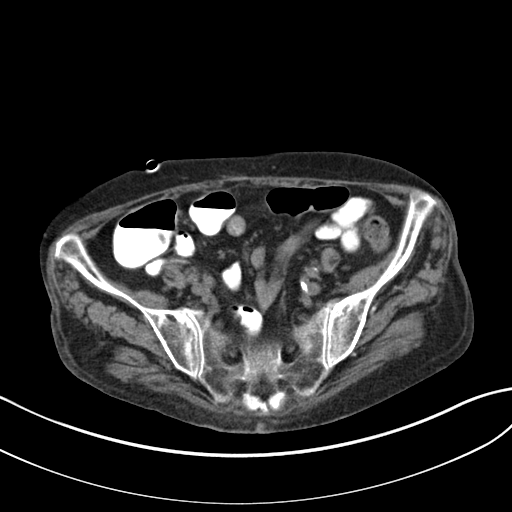
[im 35/79  soft-tissue]
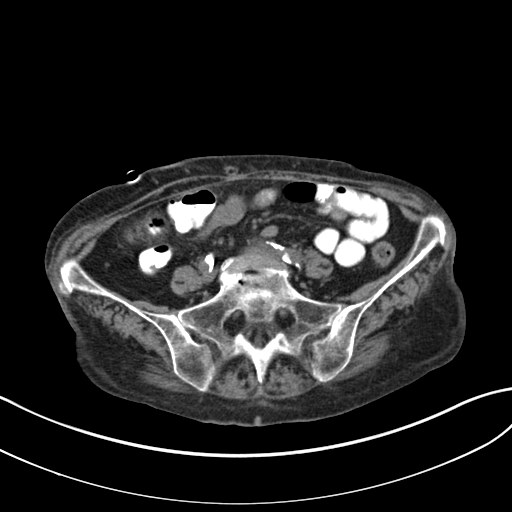
[im 44/79  soft-tissue]
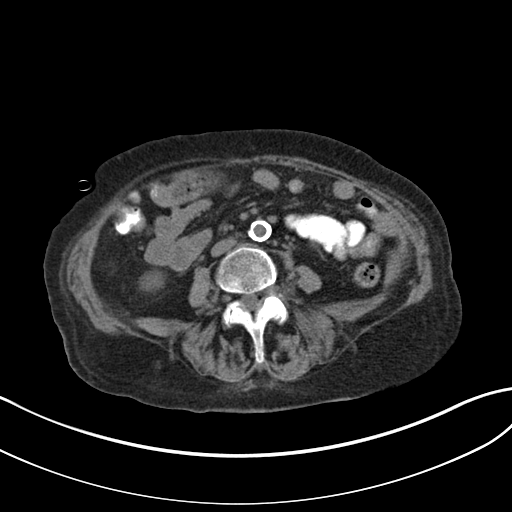
[im 49/79  soft-tissue]
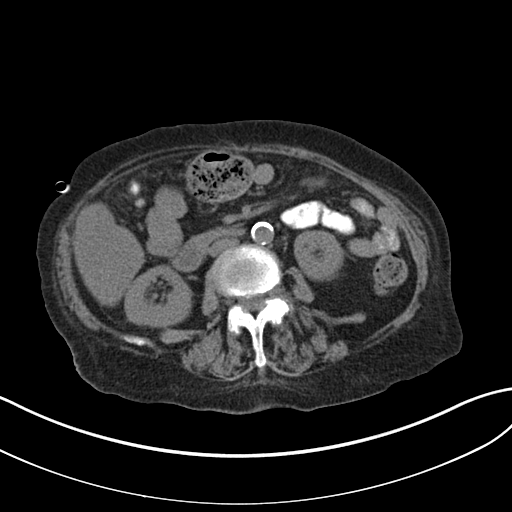
[im 49/79  bone]
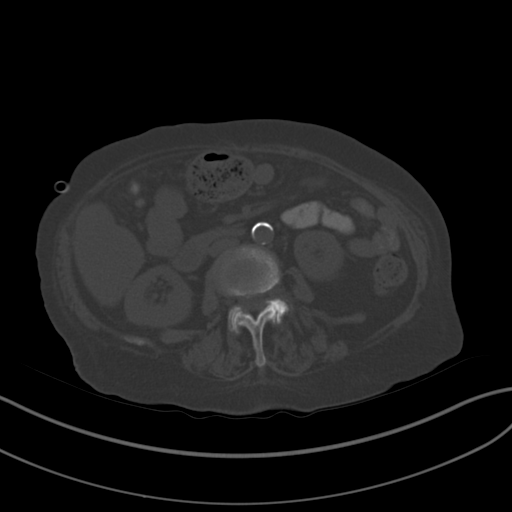
[im 54/79  soft-tissue]
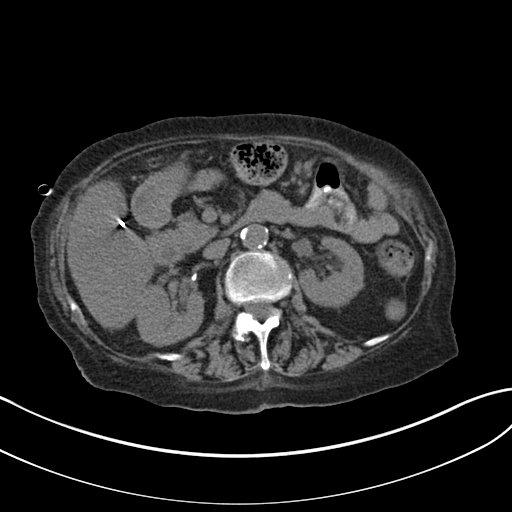
[im 59/79  soft-tissue]
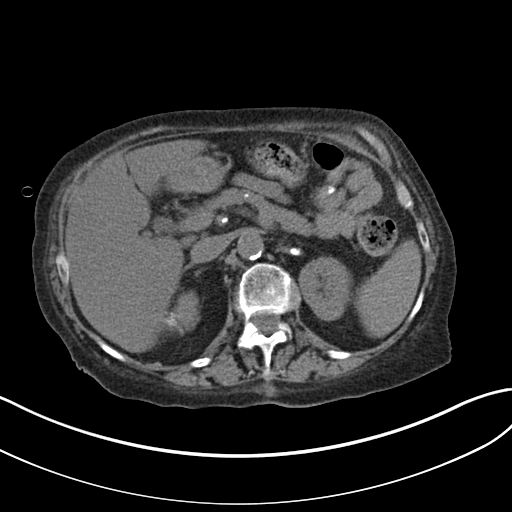
[im 64/79  soft-tissue]
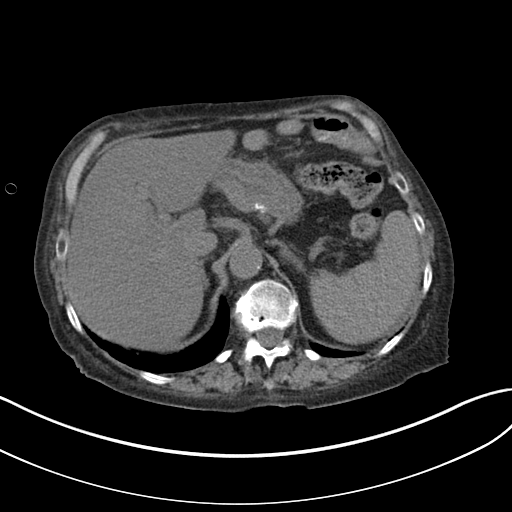
[im 69/79  soft-tissue]
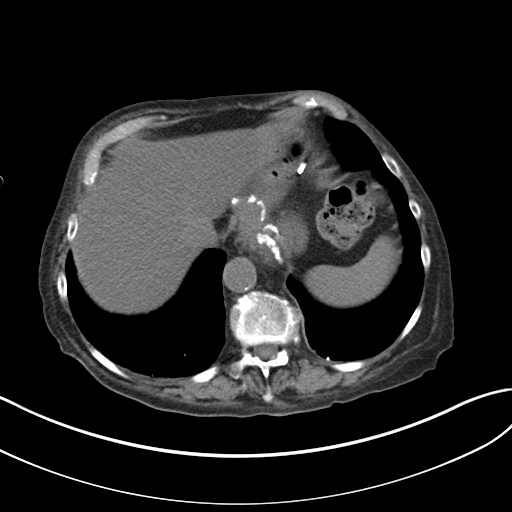
[im 74/79  soft-tissue]
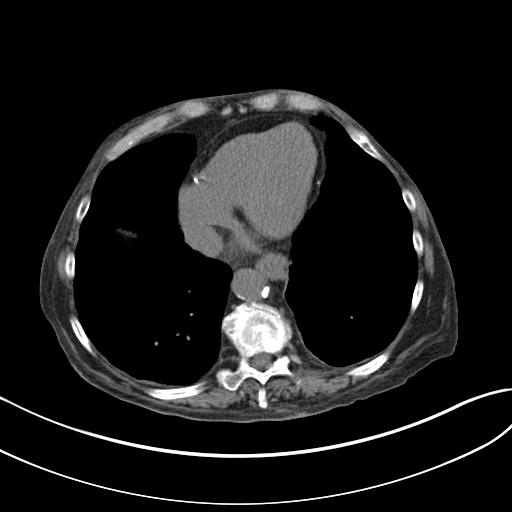

[Series 5: coronal st · coronal · 0.83mm/px · 3 of 118 slices shown]
[im 40/118  soft-tissue]
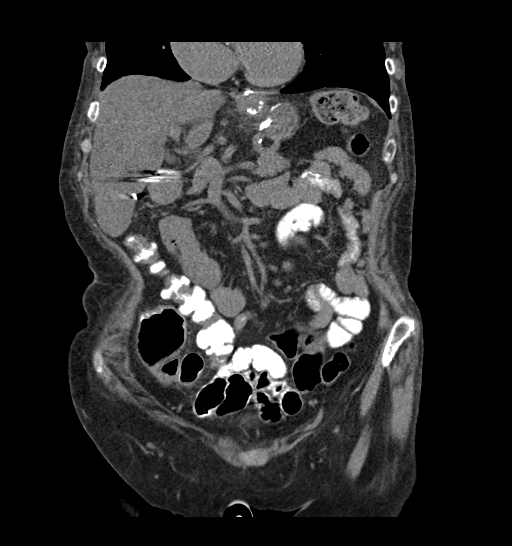
[im 53/118  soft-tissue]
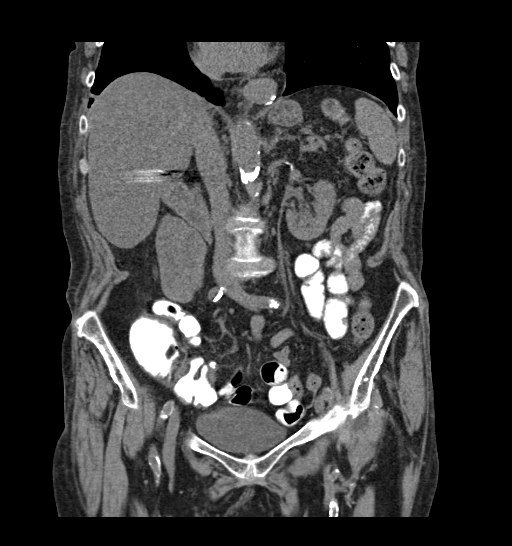
[im 66/118  soft-tissue]
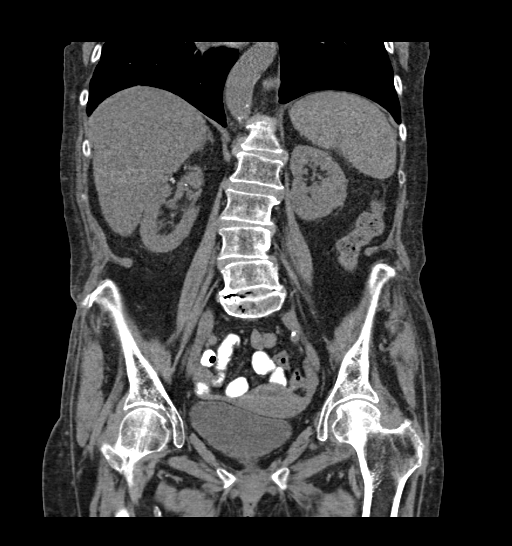

[17 of 46 positions shown; findings below may reference images not displayed]

FINDINGS: Lower chest: No acute abnormality. Coronary artery atherosclerosis.
Thoracic aortic atherosclerosis.

Hepatobiliary: No focal liver abnormality is seen. Status post
cholecystectomy. No biliary dilatation.

Pancreas: Unremarkable. No pancreatic ductal dilatation or
surrounding inflammatory changes.

Spleen: Normal in size without focal abnormality.

Adrenals/Urinary Tract: Adrenal glands are unremarkable. Kidneys are
normal, without renal calculi, focal lesion, or hydronephrosis.
Bladder is unremarkable.

Stomach/Bowel: Stomach and bowel are suboptimally evaluated
secondary to lack of oral contrast. Postsurgical changes of the
stomach. Small hiatal hernia. No bowel wall thickening or bowel
dilatation. No pneumatosis, pneumoperitoneum or portal venous gas.

Vascular/Lymphatic: Extensive abdominal aortic atherosclerosis. No
lymphadenopathy.

Reproductive: Uterus and bilateral adnexa are unremarkable.

Other: No abdominal wall hernia or abnormality. No abdominopelvic
ascites.

Musculoskeletal: No acute osseous abnormality. No aggressive osseous
lesion. Moderate osteoarthritis of the left hip. Degenerative
disease with disc height loss at L4-5 and L5-S1 with bilateral facet
arthropathy. Bilateral foraminal stenosis at L5-S1.
IMPRESSION: 1. No bowel obstruction.
2. Postsurgical changes of the stomach with a small hiatal hernia.
3.  Aortic Atherosclerosis ([9B]-[9B]).
4. Coronary artery atherosclerosis.

## 2021-01-26 MED ORDER — ONDANSETRON HCL 4 MG/2ML IJ SOLN
4.0000 mg | Freq: Once | INTRAMUSCULAR | Status: AC
  Administered 2021-01-26: 4 mg via INTRAVENOUS
  Filled 2021-01-26: qty 2

## 2021-01-26 MED ORDER — FAMOTIDINE 20 MG PO TABS: 20.0000 mg | ORAL_TABLET | Freq: Two times a day (BID) | ORAL | 0 refills | Status: DC

## 2021-01-26 MED ORDER — CEPHALEXIN 500 MG PO CAPS: 500.0000 mg | ORAL_CAPSULE | Freq: Two times a day (BID) | ORAL | 0 refills | Status: AC

## 2021-01-26 MED ORDER — SUCRALFATE 1 G PO TABS: 1.0000 g | ORAL_TABLET | Freq: Three times a day (TID) | ORAL | 0 refills | Status: DC

## 2021-01-26 MED ORDER — SODIUM CHLORIDE 0.9 % IV SOLN: INTRAVENOUS | Status: DC

## 2021-01-26 NOTE — Discharge Instructions (Addendum)
Please be sure to follow-up with your primary care physician as well as your gastroenterologist as scheduled.  Take all medication as directed and do not hesitate to return here for further evaluation.

## 2021-01-26 NOTE — ED Triage Notes (Signed)
Pt states she was diagnosed with a hernia a month ago, had MRI done and was referred to GI. Has had increasing inability to eat due to pain. States she has constant pain but as she has a few bites of food the pain will increase.

## 2021-01-26 NOTE — ED Provider Notes (Signed)
[facility] DEPT Provider Note   CSN: [id number] Arrival date & time: 01/26/21  0759     History Chief Complaint  Patient presents with   Abdominal Pain    Heather Garrett is a 70 y.o. female.  HPI  Patient presents with abdominal pain, anorexia, nausea, minimal oral intake. She has a transitional carcinoma, in remission.  She also has a notable history of gastric bypass performed 12 years ago in another area. Now over the past month, since moving here she has had worsening epigastric pain, with minimal oral intake, substantial weight loss.  There is diffuse fatigue, weakness, but nothing focal.  No fever, no chest pain, no dyspnea, no relief with anything.  She has seen her physician, been referred to GI, but has not had an evaluation.     Past Medical History:  Diagnosis Date   Biceps tendinitis    Cobalamin deficiency    Coronary arteriosclerosis in native artery    Elevated liver function tests    Hyperlipidemia    Migraine    Rotator cuff syndrome    Transitional cell carcinoma, bladder (HCC)    Vitamin D deficiency     There are no problems to display for this patient.   Past Surgical History:  Procedure Laterality Date   BREAST SURGERY     CARDIAC CATHETERIZATION     DG  BONE DENSITY ([facility] HX)     DG GALL BLADDER     ENDOMETRIAL ABLATION     GASTRIC BYPASS     MASTECTOMY     OTHER SURGICAL HISTORY     CANCER SURGERY     OB History   No obstetric history on file.     History reviewed. No pertinent family history.  Social History   Tobacco Use   Smoking status: Former   Smokeless tobacco: Never   Tobacco comments:    Quit 30 yrs ago  Substance Use Topics   Alcohol use: Yes    Alcohol/week: 14.0 standard drinks    Types: 14 Glasses of wine per week    Home Medications Prior to Admission medications   Medication Sig Start Date End Date Taking? Authorizing Provider  acetaminophen (TYLENOL) 500 MG tablet  Take 1,000 mg by mouth every 6 (six) hours as needed for mild pain.   Yes [provider]  aspirin 81 MG EC tablet Take 81 mg by mouth every other day. Swallow whole.   Yes [provider]  B Complex Vitamins (VITAMIN B COMPLEX PO) Take by mouth.   Yes [provider]  calcium carbonate (TUMS - DOSED IN MG ELEMENTAL CALCIUM) 500 MG chewable tablet Chew 1-2 tablets by mouth daily as needed for indigestion or heartburn.   Yes [provider]  carvedilol (COREG) 3.125 MG tablet Take 3.125 mg by mouth 2 (two) times daily.   Yes [provider]  cephALEXin (KEFLEX) 500 MG capsule Take 1 capsule (500 mg total) by mouth 2 (two) times daily for 5 days. 01/26/21 01/31/21 Yes Carmin Muskrat, MD  ezetimibe-simvastatin (VYTORIN) 10-80 MG tablet Take 1 tablet by mouth daily.   Yes [provider]  famotidine (PEPCID) 20 MG tablet Take 1 tablet (20 mg total) by mouth 2 (two) times daily. Take one tablet twice daily for two days 01/26/21  Yes Carmin Muskrat, MD  omeprazole (PRILOSEC) 20 MG capsule Take 20 mg by mouth 2 (two) times daily before a meal.   Yes [provider]  Polyethylene Glycol  400 (BLINK TEARS) 0.25 % SOLN Apply 1 drop to eye daily as needed (dry eyes).   Yes [provider]  Probiotic Product (PROBIOTIC PO) Take 1 tablet by mouth daily.   Yes [provider]  sertraline (ZOLOFT) 100 MG tablet Take 100 mg by mouth daily.   Yes [provider]  sucralfate (CARAFATE) 1 g tablet Take 1 tablet (1 g total) by mouth 4 (four) times daily -  with meals and at bedtime. 01/26/21  Yes Carmin Muskrat, MD  tiZANidine (ZANAFLEX) 4 MG capsule Take 4 mg by mouth at bedtime.   Yes [provider]  topiramate (TOPAMAX) 50 MG tablet Take 50 mg by mouth at bedtime.   Yes [provider]  traZODone (DESYREL) 50 MG tablet Take 50 mg by mouth at bedtime as needed for sleep.   Yes [provider]  Vitamin  D, Ergocalciferol, (DRISDOL) 1.25 MG (50000 UNIT) CAPS capsule Take 50,000 Units by mouth once a week. Sunday 12/20/20  Yes [provider]  furosemide (LASIX) 20 MG tablet Take 2 tablets (40 mg total) by mouth daily for 7 days only, then take 1 tablet (20 mg total) by mouth daily thereafter. Patient not taking: Reported on 01/26/2021 11/26/20   [name], MD  SUMAtriptan (IMITREX) 50 MG tablet Take 50 mg by mouth every 2 (two) hours as needed for migraine. May repeat in 2 hours if headache persists or recurs.    [provider]    Allergies    Contrast media [iodinated diagnostic agents], Ciprofloxacin, Erythromycin base, and Sulfabenzamide  Review of Systems   Review of Systems  Constitutional:        Per HPI, otherwise negative  HENT:         Per HPI, otherwise negative  Respiratory:         Per HPI, otherwise negative  Cardiovascular:        Per HPI, otherwise negative  Gastrointestinal:  Negative for vomiting.  Endocrine:       Negative aside from HPI  Genitourinary:        Neg aside from HPI   Musculoskeletal:        Per HPI, otherwise negative  Skin: Negative.   Neurological:  Positive for weakness. Negative for syncope.   Physical Exam Updated Vital Signs BP (!) 147/91   Pulse (!) 53   Temp 97.9 F (36.6 C) (Oral)   Resp 16   Ht 4\' 8"  (1.422 m)   Wt 46.3 kg   SpO2 100%   BMI 22.87 kg/m   Physical Exam Vitals and nursing note reviewed.  Constitutional:      Appearance: She is ill-appearing.  HENT:     Head: Normocephalic and atraumatic.  Eyes:     Conjunctiva/sclera: Conjunctivae normal.  Cardiovascular:     Rate and Rhythm: Normal rate and regular rhythm.  Pulmonary:     Effort: Pulmonary effort is normal. No respiratory distress.     Breath sounds: Normal breath sounds. No stridor.  Abdominal:     General: There is no distension.     Tenderness: There is abdominal tenderness in the epigastric area. There is guarding.   Skin:    General: Skin is warm and dry.  Neurological:     Mental Status: She is alert and oriented to person, place, and time.     Cranial Nerves: No cranial nerve deficit.    ED Results / Procedures / Treatments   Labs (all labs ordered  are listed, but only abnormal results are displayed) Labs Reviewed  COMPREHENSIVE METABOLIC PANEL - Abnormal; Notable for the following components:      Result Value   Total Protein 6.3 (*)    Albumin 2.9 (*)    AST 47 (*)    Alkaline Phosphatase 262 (*)    All other components within normal limits  CBC WITH DIFFERENTIAL/PLATELET - Abnormal; Notable for the following components:   RBC 3.56 (*)    Hemoglobin 11.9 (*)    MCV 102.5 (*)    RDW 15.7 (*)    All other components within normal limits  URINALYSIS, ROUTINE W REFLEX MICROSCOPIC - Abnormal; Notable for the following components:   Ketones, ur 5 (*)    Leukocytes,Ua LARGE (*)    Bacteria, UA RARE (*)    All other components within normal limits  LIPASE, BLOOD  LACTIC ACID, PLASMA    EKG None  Radiology CT ABDOMEN PELVIS WO CONTRAST  Result Date: 01/26/2021 CLINICAL DATA:  Hiatal hernia month ago.  Abdominal pain. EXAM: CT ABDOMEN AND PELVIS WITHOUT CONTRAST TECHNIQUE: Multidetector CT imaging of the abdomen and pelvis was performed following the standard protocol without IV contrast. COMPARISON:  CT chest 01/02/2021 FINDINGS: Lower chest: No acute abnormality. Coronary artery atherosclerosis. Thoracic aortic atherosclerosis. Hepatobiliary: No focal liver abnormality is seen. Status post cholecystectomy. No biliary dilatation. Pancreas: Unremarkable. No pancreatic ductal dilatation or surrounding inflammatory changes. Spleen: Normal in size without focal abnormality. Adrenals/Urinary Tract: Adrenal glands are unremarkable. Kidneys are normal, without renal calculi, focal lesion, or hydronephrosis. Bladder is unremarkable. Stomach/Bowel: Stomach and bowel are suboptimally evaluated  secondary to lack of oral contrast. Postsurgical changes of the stomach. Small hiatal hernia. No bowel wall thickening or bowel dilatation. No pneumatosis, pneumoperitoneum or portal venous gas. Vascular/Lymphatic: Extensive abdominal aortic atherosclerosis. No lymphadenopathy. Reproductive: Uterus and bilateral adnexa are unremarkable. Other: No abdominal wall hernia or abnormality. No abdominopelvic ascites. Musculoskeletal: No acute osseous abnormality. No aggressive osseous lesion. Moderate osteoarthritis of the left hip. Degenerative disease with disc height loss at L4-5 and L5-S1 with bilateral facet arthropathy. Bilateral foraminal stenosis at L5-S1. IMPRESSION: 1. No bowel obstruction. 2. Postsurgical changes of the stomach with a small hiatal hernia. 3.  Aortic Atherosclerosis (ICD10-I70.0). 4. Coronary artery atherosclerosis. Electronically Signed   By: Kathreen Devoid   On: 01/26/2021 12:16    Procedures Procedures   Medications Ordered in ED Medications  0.9 %  sodium chloride infusion ( Intravenous New Bag/Given 01/26/21 0900)  ondansetron (ZOFRAN) injection 4 mg (4 mg Intravenous Given 01/26/21 0900)    ED Course  I have reviewed the triage vital signs and the nursing notes.  Pertinent labs & imaging results that were available during my care of the patient were reviewed by me and considered in my medical decision making (see chart for details).  On repeat exam patient is awake, alert, now resting in a supine position, more comfortable. We have reviewed findings thus far including CT reassuring with no evidence for diverticulitis, pneumoperitoneum, and no evidence for complication of her gastric procedure. Labs generally reassuring, urinalysis now available, consistent with urinary tract infection given the patient's description of recurrent similar issues. Patient is hemodynamically unremarkable, awake, alert, has a GI and primary care follow-up capacity.  Without evidence for acute  abdominal processes, given her improvement here she is started on appropriate meds, discharged in stable condition. MDM Rules/Calculators/A&P MDM Number of Diagnoses or Management Options Epigastric pain: established, worsening Lower urinary tract infectious disease: new,  needed workup   Amount and/or Complexity of Data Reviewed Clinical lab tests: ordered and reviewed Tests in the radiology section of CPT: ordered and reviewed Tests in the medicine section of CPT: reviewed and ordered Decide to obtain previous medical records or to obtain history from someone other than the patient: yes Obtain history from someone other than the patient: yes Review and summarize past medical records: yes Independent visualization of images, tracings, or specimens: yes  Risk of Complications, Morbidity, and/or Mortality Presenting problems: high Diagnostic procedures: high Management options: high  Critical Care Total time providing critical care: < 30 minutes  Patient Progress Patient progress: improved   Final Clinical Impression(s) / ED Diagnoses Final diagnoses:  Epigastric pain  Lower urinary tract infectious disease    Rx / DC Orders ED Discharge Orders          Ordered    cephALEXin (KEFLEX) 500 MG capsule  2 times daily        01/26/21 1601    famotidine (PEPCID) 20 MG tablet  2 times daily        01/26/21 1601    sucralfate (CARAFATE) 1 g tablet  3 times daily with meals & bedtime       Note to Pharmacy: Take for one week   01/26/21 1601             Carmin Muskrat, MD 01/26/21 1604

## 2021-02-09 ENCOUNTER — Telehealth: Payer: Self-pay | Admitting: Internal Medicine

## 2021-02-09 NOTE — Telephone Encounter (Signed)
[facility] labs received: Ferritin 20. Is being seen in follow up for abdominal pain from ED; will need treatment of this.  Slated to see Richardson Dopp 02/12/21.  A CBC may be reasonable for f/u  Rudean Haskell, MD [facility]  [address] [place], [place] [address] [phone]  12:12 PM

## 2021-02-11 NOTE — Progress Notes (Signed)
Cardiology Office Note:    Date:  02/12/2021   ID:  Heather Garrett, DOB [birth year], MRN [id number]  PCP:  [name], MD   [facility] Providers Cardiologist:  [name], MD Cardiology APP:  [name]      Referring MD: Michael Boston, MD   Chief Complaint:  Follow-up (CAD, CHF)    Patient Profile:    Heather Garrett is a 70 y.o. female with:  Coronary artery disease  Hx of MI in 2001 tx with BMS to LAD Stress echocardiogram 07/2018: no ischemia (HFpEF) heart failure with preserved ejection fraction  Echocardiogram 3/22: EF 50-55, Gr 1 DD Diabetes mellitus  Hypertension  Hyperlipidemia  OSA  Tobacco abuse  B12 deficiency Vit D deficiency  Bladder CA  Elevated LFTs Hx of DVT Post phlebitis syndrome Hyponatremia  Renal Cell CA  Hx of Breast CA  Anemia   Prior CV studies: Echocardiogram 09/24/20 ([place], [place]) EF 50-55, Gr 1 DD, mod LAE, AV sclerosis w/o AS, MAC w trace MR, PASP 33   Per notes>>Stress Echocardiogram 07/2018 [facility] - Dr. Arcola Jansky in [place], [place]  No ischemia   Carotid US 04/21/17 <50% Bilat ICA   History of Present Illness: [name] moved to [place] from [place] in May and established with Dr. Johney Frame.  The pt had LE edema.  An NT-Pro BNP was elevated and she had mild normocytic anemia on CBC.  Her furosemide was increased and she was asked to f/u with primary care for anemia.  She returns for f/u.  She is here alone.  She stopped taking the furosemide after the increased dose for 1 week.  Her edema resolved and she has not had any recurrence.  Her weights are down.  She has not had significant shortness of breath.  She has not had orthopnea, syncope, chest pain.  She just established with a new primary care provider (Dr. Rachell Cipro).  She has labs pending in the next couple of weeks as well as referrals to gastroenterology, neurology.    Past Medical History:  Diagnosis  Date   Biceps tendinitis    Cobalamin deficiency    Coronary arteriosclerosis in native artery    Elevated liver function tests    Hyperlipidemia    Migraine    Rotator cuff syndrome    Transitional cell carcinoma, bladder (HCC)    Vitamin D deficiency     Current Medications: Current Meds  Medication Sig   acetaminophen (TYLENOL) 500 MG tablet Take 1,000 mg by mouth every 6 (six) hours as needed for mild pain.   aspirin 81 MG EC tablet Take 81 mg by mouth every other day. Swallow whole.   B Complex Vitamins (VITAMIN B COMPLEX PO) Take by mouth.   calcium carbonate (TUMS - DOSED IN MG ELEMENTAL CALCIUM) 500 MG chewable tablet Chew 1-2 tablets by mouth daily as needed for indigestion or heartburn.   carvedilol (COREG) 3.125 MG tablet Take 3.125 mg by mouth 2 (two) times daily.   ezetimibe-simvastatin (VYTORIN) 10-80 MG tablet Take 1 tablet by mouth daily.   famotidine (PEPCID) 20 MG tablet Take 1 tablet (20 mg total) by mouth 2 (two) times daily. Take one tablet twice daily for two days   lisinopril (ZESTRIL) 5 MG tablet Take 1 tablet (5 mg total) by mouth daily.   omeprazole (PRILOSEC) 20 MG capsule Take 20 mg by mouth 2 (two) times daily before a meal.   Polyethylene  Glycol 400 (BLINK TEARS) 0.25 % SOLN Apply 1 drop to eye daily as needed (dry eyes).   Probiotic Product (PROBIOTIC PO) Take 1 tablet by mouth daily.   sertraline (ZOLOFT) 100 MG tablet Take 100 mg by mouth daily.   tiZANidine (ZANAFLEX) 4 MG capsule Take 4 mg by mouth at bedtime.   topiramate (TOPAMAX) 50 MG tablet Take 100 mg by mouth at bedtime.   Vitamin D, Ergocalciferol, (DRISDOL) 1.25 MG (50000 UNIT) CAPS capsule Take 50,000 Units by mouth once a week. Sunday     Allergies:   Contrast media [iodinated diagnostic agents], Ciprofloxacin, Erythromycin base, and Sulfabenzamide   Social History   Tobacco Use   Smoking status: Former   Smokeless tobacco: Never   Tobacco comments:    Quit 30 yrs ago  Substance  Use Topics   Alcohol use: Yes    Alcohol/week: 14.0 standard drinks    Types: 14 Glasses of wine per week     Family Hx: The patient's family history is not on file.  Review of Systems  Gastrointestinal:  Positive for abdominal pain.    EKGs/Labs/Other Test Reviewed:    EKG:  EKG is not ordered today.  The ekg ordered today demonstrates N/A  Recent Labs: 11/25/2020: NT-Pro BNP 1,702 01/26/2021: ALT 28; BUN 13; Creatinine, Ser 0.74; Hemoglobin 11.9; Platelets 203; Potassium 4.7; Sodium 138   Recent Lipid Panel No results found for: CHOL, TRIG, HDL, LDLCALC, LDLDIRECT    Risk Assessment/Calculations:      Physical Exam:    VS:  BP (!) 150/90   Pulse 92   Ht _0  (1.422 m)   Wt 104 lb 12.8 oz (47.5 kg)   SpO2 96%   BMI 23.50 kg/m     Wt Readings from Last 3 Encounters:  02/12/21 104 lb 12.8 oz (47.5 kg)  01/26/21 102 lb (46.3 kg)  11/25/20 114 lb 12.8 oz (52.1 kg)     Constitutional:      Appearance: Healthy appearance. Not in distress.  Neck:     Vascular: JVD normal.  Pulmonary:     Effort: Pulmonary effort is normal.     Breath sounds: No wheezing. No rales.  Cardiovascular:     Normal rate. Regular rhythm. Normal S1. Normal S2.      Murmurs: There is no murmur.  Edema:    Peripheral edema present.    Ankle: bilateral trace edema of the ankle. Abdominal:     General: There is no abdominal bruit.     Palpations: Abdomen is soft.     Tenderness: There is abdominal tenderness (mild RUQ/epigastric).  Skin:    General: Skin is warm and dry.  Neurological:     Mental Status: Alert and oriented to person, place and time.     Cranial Nerves: Cranial nerves are intact.         ASSESSMENT & PLAN:    1. Coronary artery disease involving native coronary artery of native heart without angina pectoris History of MI treated with stenting to the LAD in 2001.  She had a stress echocardiogram in 2020 that was low risk.  She is doing well without anginal symptoms.   Continue aspirin, ezetimibe/simvastatin, carvedilol.  Follow-up in 6 months.  2. Chronic heart failure with preserved ejection fraction (HCC) Echocardiogram in March 2022 demonstrated normal EF with mild diastolic dysfunction.  She was recently volume overloaded when she saw Dr. Johney Frame.  She took Lasix for 1 week and stopped it.  She has  not had any further leg swelling since that time.  On exam today, her volume status appears stable.  NYHA IIb.  We discussed the importance of daily weights and when to take as needed furosemide.  3. Essential hypertension Blood pressure above target.  She used to be on lisinopril/HCTZ.  She had an admission in [facility] for dehydration and hypotension.  She was taken off of it at that time.  She did well with lisinopril in the past.  Her heart rates have been in the 60s.  Continue current dose of carvedilol.  Add lisinopril 5 mg daily.  She has labs pending with primary care in the next couple of weeks.  It sounds like she is having a full panel obtained and I will ask for those labs to be forwarded.  If she does not have a basic metabolic panel, we will need to arrange this.  4. Mixed hyperlipidemia Continue simvastatin/ezetimibe.  5. Abdominal pain, unspecified abdominal location She describes symptoms that sound somewhat concerning for food fear.  Question if she could have mesenteric ischemia.  Her recent abdominal pelvic CT did demonstrate significant abdominal aortic atherosclerosis.  She does note a prior history of hiatal hernia.  She has appointments pending with primary care and gastroenterology.  I will arrange an abdominal ultrasound to rule out mesenteric ischemia.  6. Iron deficiency I received notes from the cardiologist covering for Dr. Johney Frame while she is out.  Apparently a lab report from an outside hospital demonstrated a ferritin level of 20.  As noted, she does have follow-up with primary care and she has extensive blood work pending.   The patient does note that she is not eating very well and has lost a significant amount of weight secondary to this.  I suspect her low iron is, at least in part, related to poor diet.         Dispo:  Return in about 6 months (around 08/15/2021) for Routine Follow Up, w/ Dr. Johney Frame, or Richardson Dopp, PA-C.   Medication Adjustments/Labs and Tests Ordered: Current medicines are reviewed at length with the patient today.  Concerns regarding medicines are outlined above.  Tests Ordered: Orders Placed This Encounter  Procedures   VAS [place] MESENTERIC   Medication Changes: Meds ordered this encounter  Medications   lisinopril (ZESTRIL) 5 MG tablet    Sig: Take 1 tablet (5 mg total) by mouth daily.    Dispense:  90 tablet    Refill:  3    Signed, Richardson Dopp, PA-C  02/12/2021 11:19 AM    [facility] [address], [place], [place]  [address] Phone: [phone]; Fax: [phone]

## 2021-02-12 ENCOUNTER — Ambulatory Visit: Payer: Medicare PPO | Admitting: Physician Assistant

## 2021-02-12 ENCOUNTER — Other Ambulatory Visit: Payer: Self-pay

## 2021-02-12 ENCOUNTER — Encounter: Payer: Self-pay | Admitting: Physician Assistant

## 2021-02-12 ENCOUNTER — Telehealth: Payer: Self-pay | Admitting: *Deleted

## 2021-02-12 VITALS — BP 150/90 | HR 92 | Ht <= 58 in | Wt 104.8 lb

## 2021-02-12 DIAGNOSIS — E611 Iron deficiency: Secondary | ICD-10-CM

## 2021-02-12 DIAGNOSIS — R109 Unspecified abdominal pain: Secondary | ICD-10-CM

## 2021-02-12 DIAGNOSIS — I2510 Atherosclerotic heart disease of native coronary artery without angina pectoris: Secondary | ICD-10-CM

## 2021-02-12 DIAGNOSIS — E782 Mixed hyperlipidemia: Secondary | ICD-10-CM

## 2021-02-12 DIAGNOSIS — I5032 Chronic diastolic (congestive) heart failure: Secondary | ICD-10-CM

## 2021-02-12 DIAGNOSIS — I10 Essential (primary) hypertension: Secondary | ICD-10-CM | POA: Diagnosis not present

## 2021-02-12 MED ORDER — LISINOPRIL 5 MG PO TABS: 5.0000 mg | ORAL_TABLET | Freq: Every day | ORAL | 3 refills | Status: DC

## 2021-02-12 NOTE — Patient Instructions (Addendum)
Medication Instructions:   START Lisinopril one tablet by mouth ( 5 mg ) daily.   Your physician recommends that you continue on your current medications as directed. Please refer to the Current Medication list given to you today.  *If you need a refill on your cardiac medications before your next appointment, please call your pharmacy*   Lab Work:  -NONE  If you have labs (blood work) drawn today and your tests are completely normal, you will receive your results only by: [place] (if you have MyChart) OR A paper copy in the mail If you have any lab test that is abnormal or we need to change your treatment, we will call you to review the results.   Testing/Procedures:  -NONE  Follow-Up: At [facility], you and your health needs are our priority.  As part of our continuing mission to provide you with exceptional heart care, we have created designated Provider Care Teams.  These Care Teams include your primary Cardiologist (physician) and Advanced Practice Providers (APPs -  Physician Assistants and Nurse Practitioners) who all work together to provide you with the care you need, when you need it.  We recommend signing up for the patient portal called "MyChart".  Sign up information is provided on this After Visit Summary.  MyChart is used to connect with patients for Virtual Visits (Telemedicine).  Patients are able to view lab/test results, encounter notes, upcoming appointments, etc.  Non-urgent messages can be sent to your provider as well.   To learn more about what you can do with MyChart, go to [email or web address].    Your next appointment:   6 month(s) with Dr. Johney Frame on Thursday, February 16 @ 9:00 am.   The format for your next appointment:   In Person  Provider:   Gwyndolyn Kaufman, MD   Other Instructions:

## 2021-02-12 NOTE — Telephone Encounter (Signed)
S/w [place] at [facility] office will be drawing B12/lipid/ferritin, Richardson Dopp needs a bmet, will fax order to # [phone] for results to be faxed back to [place] on 8/19.

## 2021-02-19 ENCOUNTER — Institutional Professional Consult (permissible substitution): Payer: Medicare PPO | Admitting: Pulmonary Disease

## 2021-02-22 ENCOUNTER — Ambulatory Visit (HOSPITAL_COMMUNITY)
Admission: RE | Admit: 2021-02-22 | Discharge: 2021-02-22 | Disposition: A | Discharge status: Home / Self Care | Payer: Medicare PPO | Source: Ambulatory Visit | Attending: Internal Medicine | Admitting: Internal Medicine

## 2021-02-22 ENCOUNTER — Other Ambulatory Visit: Payer: Self-pay

## 2021-02-22 DIAGNOSIS — I10 Essential (primary) hypertension: Secondary | ICD-10-CM | POA: Insufficient documentation

## 2021-02-22 DIAGNOSIS — I2510 Atherosclerotic heart disease of native coronary artery without angina pectoris: Secondary | ICD-10-CM | POA: Insufficient documentation

## 2021-02-22 DIAGNOSIS — I5032 Chronic diastolic (congestive) heart failure: Secondary | ICD-10-CM | POA: Diagnosis not present

## 2021-02-22 DIAGNOSIS — E782 Mixed hyperlipidemia: Secondary | ICD-10-CM | POA: Diagnosis present

## 2021-02-22 DIAGNOSIS — R109 Unspecified abdominal pain: Secondary | ICD-10-CM | POA: Diagnosis present

## 2021-02-24 ENCOUNTER — Encounter (HOSPITAL_BASED_OUTPATIENT_CLINIC_OR_DEPARTMENT_OTHER): Payer: Self-pay

## 2021-03-08 ENCOUNTER — Encounter: Payer: Self-pay | Admitting: Gastroenterology

## 2021-03-08 ENCOUNTER — Ambulatory Visit: Payer: Medicare PPO | Admitting: Gastroenterology

## 2021-03-08 VITALS — BP 134/86 | HR 72 | Ht <= 58 in | Wt 101.0 lb

## 2021-03-08 DIAGNOSIS — R1013 Epigastric pain: Secondary | ICD-10-CM | POA: Diagnosis not present

## 2021-03-08 DIAGNOSIS — Z860100 Personal history of colon polyps, unspecified: Secondary | ICD-10-CM

## 2021-03-08 DIAGNOSIS — Z9884 Bariatric surgery status: Secondary | ICD-10-CM

## 2021-03-08 DIAGNOSIS — R634 Abnormal weight loss: Secondary | ICD-10-CM

## 2021-03-08 MED ORDER — PLENVU 140 G PO SOLR: ORAL | 0 refills | Status: DC

## 2021-03-08 NOTE — Patient Instructions (Signed)
If you are age 71 or older, your body mass index should be between 23-30. Your Body mass index is 22.64 kg/m. If this is out of the aforementioned range listed, please consider follow up with your Primary Care Provider.  If you are age 57 or younger, your body mass index should be between 19-25. Your Body mass index is 22.64 kg/m. If this is out of the aformentioned range listed, please consider follow up with your Primary Care Provider.   You have been scheduled for an endoscopy and colonoscopy. Please follow the written instructions given to you at your visit today. Please pick up your prep supplies at the pharmacy within the next 1-3 days. If you use inhalers (even only as needed), please bring them with you on the day of your procedure.  We will send a referral to [facility] and they will call you with an appointment.  The [facility] providers would like to encourage you to use [facility] to communicate with providers for non-urgent requests or questions.  Due to long hold times on the telephone, sending your provider a message by [facility] may be a faster and more efficient way to get a response.  Please allow 48 business hours for a response.  Please remember that this is for non-urgent requests.   It was a pleasure to see you today!  Thank you for trusting me with your gastrointestinal care!    Scott E. [name], MD

## 2021-03-08 NOTE — Progress Notes (Signed)
HPI : Anastasi Kegg is a very pleasant 70 year old female with a history of coronary artery disease, hyperlipidemia and remote smoking history who was referred to [place] because of severe postprandial abdominal pain with weight loss.  Patient states that she has been having this pain for months.  She describes severe epigastric pain and fullness which starts usually after only 5 or 6 bites of a meal.  The pain usually lasts at least 30 to 45 minutes before it subsides, and there is very little that helps the pain go away.  She will sometimes have nausea, but no vomiting.  She has lost about 15 pounds over the past few months going from 116 pounds down to 101 pounds today.  She has a history of gastric bypass surgery, but denies any history of marginal ulcers.  No recent upper endoscopy.  Her last colonoscopy for screening was in 2015, and she states that some polyps were removed. She had a CT scan without contrast in July which showed significant atherosclerosis and calcifications of the aorta branches.  Sent abdominal ultrasound with Dopplers was not consistent with mesenteric ischemia.   Past Medical History:  Diagnosis Date   Biceps tendinitis    Cobalamin deficiency    Coronary arteriosclerosis in native artery    Elevated liver function tests    Hyperlipidemia    Migraine    Rotator cuff syndrome    Transitional cell carcinoma, bladder (HCC)    Vitamin D deficiency      Past Surgical History:  Procedure Laterality Date   BREAST SURGERY     CARDIAC CATHETERIZATION     CATARACT EXTRACTION     CHOLECYSTECTOMY  1979   DG  BONE DENSITY ([place] HX)     ENDOMETRIAL ABLATION     GASTRIC BYPASS     MASTECTOMY     OTHER SURGICAL HISTORY     CANCER SURGERY   History reviewed. No pertinent family history. Social History   Tobacco Use   Smoking status: Former   Smokeless tobacco: Never   Tobacco comments:    Quit 30 yrs ago  Substance Use Topics   Alcohol use: Yes    Alcohol/week: 14.0  standard drinks    Types: 14 Glasses of wine per week   Current Outpatient Medications  Medication Sig Dispense Refill   acetaminophen (TYLENOL) 500 MG tablet Take 1,000 mg by mouth every 6 (six) hours as needed for mild pain.     aspirin 81 MG EC tablet Take 81 mg by mouth every other day. Swallow whole.     calcium carbonate (TUMS - DOSED IN MG ELEMENTAL CALCIUM) 500 MG chewable tablet Chew 1-2 tablets by mouth daily as needed for indigestion or heartburn.     carvedilol (COREG) 3.125 MG tablet Take 3.125 mg by mouth 2 (two) times daily.     ezetimibe-simvastatin (VYTORIN) 10-80 MG tablet Take 1 tablet by mouth daily.     lisinopril (ZESTRIL) 5 MG tablet Take 1 tablet (5 mg total) by mouth daily. 90 tablet 3   omeprazole (PRILOSEC) 20 MG capsule Take 20 mg by mouth 2 (two) times daily before a meal.     Polyethylene Glycol 400 (BLINK TEARS) 0.25 % SOLN Apply 1 drop to eye daily as needed (dry eyes).     Probiotic Product (PROBIOTIC PO) Take 1 tablet by mouth daily.     sertraline (ZOLOFT) 100 MG tablet Take 100 mg by mouth daily.     tiZANidine (ZANAFLEX) 4 MG capsule  Take 4 mg by mouth at bedtime.     topiramate (TOPAMAX) 50 MG tablet Take 100 mg by mouth at bedtime.     Vitamin D, Ergocalciferol, (DRISDOL) 1.25 MG (50000 UNIT) CAPS capsule Take 50,000 Units by mouth once a week. 'Sunday     B Complex Vitamins (VITAMIN B COMPLEX PO) Take by mouth. (Patient not taking: Reported on 03/08/2021)     No current facility-administered medications for this visit.   Allergies  Allergen Reactions   Contrast Media [Iodinated Diagnostic Agents] Anaphylaxis and Hives   Ciprofloxacin Other (See Comments)    Interaction w other medicine   Erythromycin Base Nausea And Vomiting   Sulfabenzamide Other (See Comments)    UNK reaction     Review of Systems: All systems reviewed and negative except where noted in HPI.  CLINICAL DATA:  Hiatal hernia month ago.  Abdominal pain.   EXAM: CT ABDOMEN AND  PELVIS WITHOUT CONTRAST   TECHNIQUE: Multidetector CT imaging of the abdomen and pelvis was performed following the standard protocol without IV contrast.   COMPARISON:  CT chest 01/02/2021   FINDINGS: Lower chest: No acute abnormality. Coronary artery atherosclerosis. Thoracic aortic atherosclerosis.   Hepatobiliary: No focal liver abnormality is seen. Status post cholecystectomy. No biliary dilatation.   Pancreas: Unremarkable. No pancreatic ductal dilatation or surrounding inflammatory changes.   Spleen: Normal in size without focal abnormality.   Adrenals/Urinary Tract: Adrenal glands are unremarkable. Kidneys are normal, without renal calculi, focal lesion, or hydronephrosis. Bladder is unremarkable.   Stomach/Bowel: Stomach and bowel are suboptimally evaluated secondary to lack of oral contrast. Postsurgical changes of the stomach. Small hiatal hernia. No bowel wall thickening or bowel dilatation. No pneumatosis, pneumoperitoneum or portal venous gas.   Vascular/Lymphatic: Extensive abdominal aortic atherosclerosis. No lymphadenopathy.   Reproductive: Uterus and bilateral adnexa are unremarkable.   Other: No abdominal wall hernia or abnormality. No abdominopelvic ascites.   Musculoskeletal: No acute osseous abnormality. No aggressive osseous lesion. Moderate osteoarthritis of the left hip. Degenerative disease with disc height loss at L4-5 and L5-S1 with bilateral facet arthropathy. Bilateral foraminal stenosis at L5-S1.   IMPRESSION: 1. No bowel obstruction. 2. Postsurgical changes of the stomach with a small hiatal hernia. 3.  Aortic Atherosclerosis (ICD10-I70.0). 4. Coronary artery atherosclerosis.     Electronically Signed   By: Hetal  Patel   On: 01/26/2021 12:16     VAS US MESENTERIC  Result Date: 02/22/2021 ABDOMINAL VISCERAL Patient Name:  Karington J Alms  Date of Exam:   02/22/2021 Medical Rec #: [id number]            Accession #:     [id number] Date of Birth: Apr 23, 1951            Patient Gender: F Patient Age:   80 years Exam Location:  [facility] Procedure:      VAS [place] MESENTERIC Referring Phys: [id number] Andilyn Bettcher T WEAVER -------------------------------------------------------------------------------- Indications: Patient reports increase abdominal pain after eating small amounts              of found. She states after about 5 minutes of eating she becomes              full, it feels like something is stuck and pain begins. She further              states the pain will subside if she lies down onto her side. High Risk Factors: Hypertension, hyperlipidemia, Diabetes, past history of  smoking, coronary artery disease. Limitations: Air/bowel gas. Comparison Study: NA Performing Technologist: Sharlett Iles RVT  Examination Guidelines: A complete evaluation includes B-mode imaging, spectral Doppler, color Doppler, and power Doppler as needed of all accessible portions of each vessel. Bilateral testing is considered an integral part of a complete examination. Limited examinations for reoccurring indications may be performed as noted.  Duplex Findings: +--------------------+--------+--------+------+----------------------+ Mesenteric          PSV cm/sEDV cm/sPlaque       Comments        +--------------------+--------+--------+------+----------------------+ Aorta Prox             48                 1.5 cm AP x 1.5 cm TRV +--------------------+--------+--------+------+----------------------+ Aorta Mid              45                 1.8 cm AP x 1.8 cm TRV +--------------------+--------+--------+------+----------------------+ Aorta Distal           54                 1.3 cm AP x 1.3 cm TRV +--------------------+--------+--------+------+----------------------+ Celiac Artery Origin   77                                        +--------------------+--------+--------+------+----------------------+ SMA Origin              73      13                                +--------------------+--------+--------+------+----------------------+ SMA Proximal          132      26                                +--------------------+--------+--------+------+----------------------+ SMA Mid               119      20                                +--------------------+--------+--------+------+----------------------+ SMA Distal             97      13                                +--------------------+--------+--------+------+----------------------+ CHA                   128      31                                +--------------------+--------+--------+------+----------------------+ Splenic                88      30                                +--------------------+--------+--------+------+----------------------+ IMA                   100  15                                +--------------------+--------+--------+------+----------------------+ +--------+------+------+------+------+         5 min 10 min20 min30 min +--------+------+------+------+------+ Celiac  104/21                   +--------+------+------+------+------+ SMA Prox239/68209/67             +--------+------+------+------+------+ SMA Mid 266/64224/60             +--------+------+------+------+------+ SMA Dist175/46165/46             +--------+------+------+------+------+ IMA     92/11                    +--------+------+------+------+------+ Mesenteric Technologist observations: Widely patent celiac, SMA and IMA without evidence of stenosis.   Summary: Largest Aortic Diameter: Aortoiliac atherosclerosis with largest measurements at the mid segment, measuring 1.8 cm.  Mesenteric: Normal Celiac artery , Superior Mesenteric artery, Inferior Mesenteric artery, Splenic artery and Hepatic artery findings.  *See table(s) above for measurements and observations.  Diagnosing physician: Ida Rogue MD   Electronically signed by Ida Rogue MD on 02/22/2021 at 6:56:23 PM.    Final     Physical Exam: BP 134/86   Pulse 72   Ht '4\' 8"'$  (1.422 m)   Wt 101 lb (45.8 kg)   BMI 22.64 kg/m  Constitutional: Pleasant,well-developed, frail elderly Caucasian female in no acute distress.  Uses wheeled walker HEENT: Normocephalic and atraumatic. Conjunctivae are normal. No scleral icterus.  Mallampati 3 Cardiovascular: Normal rate, regular rhythm.  Pulmonary/chest: Effort normal and breath sounds normal. No wheezing, rales or rhonchi. Abdominal: Soft, nondistended, mild tenderness to palpation in the left lower quadrant, no rigidity or guarding. Bowel sounds active throughout. There are no masses palpable. No hepatomegaly. Extremities: no edema Neurological: Alert and oriented to person place and time. Skin: Skin is warm and dry. No rashes noted. Psychiatric: Normal mood and affect. Behavior is normal.  CBC    Component Value Date/Time   WBC 8.1 01/26/2021 0900   RBC 3.56 (L) 01/26/2021 0900   HGB 11.9 (L) 01/26/2021 0900   HGB 10.9 (L) 11/25/2020 1219   HCT 36.5 01/26/2021 0900   HCT 33.9 (L) 11/25/2020 1219   PLT 203 01/26/2021 0900   PLT 180 11/25/2020 1219   MCV 102.5 (H) 01/26/2021 0900   MCV 97 11/25/2020 1219   MCH 33.4 01/26/2021 0900   MCHC 32.6 01/26/2021 0900   RDW 15.7 (H) 01/26/2021 0900   RDW 14.2 11/25/2020 1219   LYMPHSABS 1.5 01/26/2021 0900   MONOABS 1.0 01/26/2021 0900   EOSABS 0.1 01/26/2021 0900   BASOSABS 0.0 01/26/2021 0900    CMP     Component Value Date/Time   NA 138 01/26/2021 0900   NA 142 12/08/2020 1056   K 4.7 01/26/2021 0900   CL 106 01/26/2021 0900   CO2 24 01/26/2021 0900   GLUCOSE 88 01/26/2021 0900   BUN 13 01/26/2021 0900   BUN 8 12/08/2020 1056   CREATININE 0.74 01/26/2021 0900   CALCIUM 8.9 01/26/2021 0900   PROT 6.3 (L) 01/26/2021 0900   ALBUMIN 2.9 (L) 01/26/2021 0900   AST 47 (H) 01/26/2021 0900   ALT 28 01/26/2021 0900   ALKPHOS  262 (H) 01/26/2021 0900   BILITOT 1.0 01/26/2021 0900   GFRNONAA >60 01/26/2021 0900  ASSESSMENT AND PLAN: 70 year old female status post gastric bypass with history of coronary artery disease, hyperlipidemia and smoking with several months of severe postprandial abdominal pain with weight loss and sitophobia.  Her clinical picture is very suggestive of chronic mesenteric ischemia.  Her CT scan showed significant atherosclerosis throughout the abdominal aorta and its branches but was limited by lack of IV contrast.  The patient reports a severe contrast allergy, which severely reduces ability to assess for mesenteric ischemia.  Her ultrasound was not consistent with mesenteric ischemia, however the clinical suspicion remains high.  I would like the patient to be evaluated by vascular surgery to be considered for repeat Dopplers versus MRA and to see if she may be a candidate for any sort of revascularization.  In the meantime, I will perform an upper endoscopy to rule out a marginal ulcer or any other luminal causes of her abdominal pain or weight loss.  Although her symptoms do not seem hindgut related, we will perform a colonoscopy as it has been over 7 years since her last one and she reportedly had polyps I recommended that the patient significantly modify her diet by consuming a high calorie/calorie dense foods in small amounts throughout the day, focusing on incorporating protein shakes, milkshakes and supplements. Regarding alcohol use, the patient admits that she drinks too much, but states that drinking alcohol is about the only thing that helps the pain.  Reassuringly, her liver appeared normal on CT scan without evidence of significant steatosis.  We will discuss this further at follow-up.  Abdominal pain/weight loss/sitophobia - EGD and colonoscopy - Vascular surgery referral for second opinion on chronic mesenteric ischemia -Continue aspirin -Diet modification as above  The details,  risks (including bleeding, perforation, infection, missed lesions, medication reactions and possible hospitalization or surgery if complications occur), benefits, and alternatives to EGD/colonoscopy with possible biopsy, dilation and polypectomy were discussed with the patient and she consents to proceed.   Nylani Michetti E. [name], [facility]   [name], MD

## 2021-03-23 ENCOUNTER — Ambulatory Visit: Payer: Medicare PPO | Admitting: Pulmonary Disease

## 2021-03-23 ENCOUNTER — Encounter: Payer: Self-pay | Admitting: Neurology

## 2021-03-23 ENCOUNTER — Other Ambulatory Visit: Payer: Self-pay

## 2021-03-23 ENCOUNTER — Ambulatory Visit: Payer: Medicare PPO | Admitting: Neurology

## 2021-03-23 ENCOUNTER — Encounter: Payer: Self-pay | Admitting: Pulmonary Disease

## 2021-03-23 VITALS — BP 116/72 | HR 84 | Ht <= 58 in | Wt 96.6 lb

## 2021-03-23 DIAGNOSIS — I951 Orthostatic hypotension: Secondary | ICD-10-CM

## 2021-03-23 DIAGNOSIS — R400 Somnolence: Secondary | ICD-10-CM

## 2021-03-23 DIAGNOSIS — R519 Headache, unspecified: Secondary | ICD-10-CM | POA: Diagnosis not present

## 2021-03-23 DIAGNOSIS — Z8669 Personal history of other diseases of the nervous system and sense organs: Secondary | ICD-10-CM | POA: Insufficient documentation

## 2021-03-23 DIAGNOSIS — R0602 Shortness of breath: Secondary | ICD-10-CM | POA: Diagnosis not present

## 2021-03-23 DIAGNOSIS — H8112 Benign paroxysmal vertigo, left ear: Secondary | ICD-10-CM | POA: Diagnosis not present

## 2021-03-23 DIAGNOSIS — G43009 Migraine without aura, not intractable, without status migrainosus: Secondary | ICD-10-CM | POA: Insufficient documentation

## 2021-03-23 NOTE — Patient Instructions (Signed)
Your shortness of breath is likely a combination of your heart failure, anemia, abnormal curvature of your back and possibly sleep disordered breathing.  We will check your oxygen levels while walking today  We will schedule you for pulmonary function tests at your convenience and message you with the results.  We will also follow up your sleep study results.

## 2021-03-23 NOTE — Progress Notes (Signed)
[facility]    Provider:  Dr Jaynee Eagles Requesting Provider: Michael Boston, MD Primary Care Provider:  [name], MD  CC:  dizziness and morning headaches  HPI:  Heather Garrett is a 70 y.o. female here as requested by Michael Boston, MD for headaches.  Past medical history new daily persistent headache, prior bariatric surgery, fibromyalgia, hypertension, breast cancer, GERD, hyperlipidemia, coronary artery disease, renal cell cancer, migraine headache, history of bladder cancer, major depression, history of breast cancer, migraine without aura.  She is currently on topiramate.  She gets dizzy when standing up. She started waking up with headaches 3-4 months ago. Her biggest concern right now is dizziness on standing. If she rolls to the left she has nausea and dizziness, doesn't affect her to the right but affects her to left. When she rolls over on the left she gets dizzy, room spins, more nausea, brief. She has had migraines in the past, but denies migraines now, she wake up with a slight headache. She does not sleep well. She had a sleep study years ago and used a cpap but lost a lot of weight and stopped using it, she doesn't sleep well, she is extremely fatigued, doesn't feel refreshed in the morning, wakes often.No other focal neurologic deficits, associated symptoms, inciting events or modifiable factors.  Reviewed notes, labs and imaging from outside physicians, which showed fatigue  TSH normal 3.03 Vit D very low 10.7  IMPRESSION: personally reviewed image and agree No evidence of acute intracranial abnormality.   Mild-to-moderate chronic small vessel ischemic changes within the cerebral white matter, and to a lesser degree within the pons.   Mild-to-moderate generalized parenchymal atrophy.  Review of Systems: Patient complains of symptoms per HPI as well as the following symptoms fatiuge. Pertinent negatives and positives per HPI. All others  negative.   Social History   Socioeconomic History   Marital status: Married    Spouse name: Not on file   Number of children: Not on file   Years of education: Not on file   Highest education level: Not on file  Occupational History   Not on file  Tobacco Use   Smoking status: Former    Passive exposure: Never   Smokeless tobacco: Never   Tobacco comments:    Quit 30 yrs ago  Vaping Use   Vaping Use: Never used  Substance and Sexual Activity   Alcohol use: Not Currently    Alcohol/week: 14.0 standard drinks    Types: 14 Glasses of wine per week   Drug use: Never   Sexual activity: Not on file  Other Topics Concern   Not on file  Social History Narrative   Not on file   Social Determinants of Health   Financial Resource Strain: Not on file  Food Insecurity: Not on file  Transportation Needs: Not on file  Physical Activity: Not on file  Stress: Not on file  Social Connections: Not on file  Intimate Partner Violence: Not on file    Family History  Problem Relation Age of Onset   Migraines Neg Hx     Past Medical History:  Diagnosis Date   Biceps tendinitis    Cobalamin deficiency    Coronary arteriosclerosis in native artery    Elevated liver function tests    Hyperlipidemia    Migraine    Rotator cuff syndrome    Transitional cell carcinoma, bladder ([place])    Vitamin D deficiency     Patient  Active Problem List   Diagnosis Date Noted   Benign paroxysmal positional vertigo of left ear 03/23/2021   Daytime somnolence 03/23/2021   History of sleep apnea 03/23/2021   Orthostatic hypotension 03/23/2021   Migraine without aura and without status migrainosus, not intractable 03/23/2021     Past Surgical History:  Procedure Laterality Date   BREAST SURGERY     CARDIAC CATHETERIZATION     CATARACT EXTRACTION     CHOLECYSTECTOMY  1979   DG  BONE DENSITY ([facility] HX)     ENDOMETRIAL ABLATION     GASTRIC BYPASS     MASTECTOMY     OTHER SURGICAL HISTORY      CANCER SURGERY    Current Outpatient Medications  Medication Sig Dispense Refill   acetaminophen (TYLENOL) 500 MG tablet Take 1,000 mg by mouth every 6 (six) hours as needed for mild pain.     aspirin 81 MG EC tablet Take 81 mg by mouth every other day. Swallow whole.     B Complex Vitamins (VITAMIN B COMPLEX PO) Take by mouth.     calcium carbonate (TUMS - DOSED IN MG ELEMENTAL CALCIUM) 500 MG chewable tablet Chew 1-2 tablets by mouth daily as needed for indigestion or heartburn.     carvedilol (COREG) 3.125 MG tablet Take 3.125 mg by mouth 2 (two) times daily.     ezetimibe-simvastatin (VYTORIN) 10-80 MG tablet Take 1 tablet by mouth daily.     lisinopril (ZESTRIL) 5 MG tablet Take 1 tablet (5 mg total) by mouth daily. 90 tablet 3   omeprazole (PRILOSEC) 20 MG capsule Take 20 mg by mouth 2 (two) times daily before a meal.     PEG-KCl-NaCl-NaSulf-Na Asc-C (PLENVU) 140 g SOLR Use as directed. 1 each 0   Polyethylene Glycol 400 (BLINK TEARS) 0.25 % SOLN Apply 1 drop to eye daily as needed (dry eyes).     Probiotic Product (PROBIOTIC PO) Take 1 tablet by mouth daily.     sertraline (ZOLOFT) 100 MG tablet Take 100 mg by mouth daily.     tiZANidine (ZANAFLEX) 4 MG capsule Take 4 mg by mouth at bedtime.     topiramate (TOPAMAX) 50 MG tablet Take 100 mg by mouth at bedtime.     Vitamin D, Ergocalciferol, (DRISDOL) 1.25 MG (50000 UNIT) CAPS capsule Take 50,000 Units by mouth once a week. Sunday     nitrofurantoin, macrocrystal-monohydrate, (MACROBID) 100 MG capsule Take 100 capsules by mouth in the morning and at bedtime.     No current facility-administered medications for this visit.    Allergies as of 03/23/2021 - Review Complete 03/23/2021  Allergen Reaction Noted   Contrast media [iodinated diagnostic agents] Anaphylaxis and Hives 11/24/2020   Ciprofloxacin Other (See Comments) 11/24/2020   Erythromycin base Nausea And Vomiting 11/24/2020   Sulfabenzamide Other (See Comments)  11/24/2020    Vitals: There were no vitals taken for this visit. Last Weight:  Wt Readings from Last 1 Encounters:  03/08/21 101 lb (45.8 kg)   Last Height:   Ht Readings from Last 1 Encounters:  03/08/21 '4\' 8"'$  (1.422 m)     Physical exam: Exam: Gen: NAD, conversant, frail appearing, thin                  CV: RRR, no MRG. No Carotid Bruits. No peripheral edema, warm, nontender Eyes: Conjunctivae clear without exudates or hemorrhage  Neuro: Detailed Neurologic Exam  Speech:    Speech is normal; fluent and spontaneous with normal  comprehension.  Cognition:    The patient is oriented to person, place, and time;     recent and remote memory intact;     language fluent;     normal attention, concentration,     fund of knowledge Cranial Nerves:    The pupils are equal, round, and reactive to light. Pupils too small to visualize findi Visual fields are full to finger confrontation. Extraocular movements are intact. Trigeminal sensation is intact and the muscles of mastication are normal. The face is symmetric. The palate elevates in the midline. Hearing intact. Voice is normal. Shoulder shrug is normal. The tongue has normal motion without fasciculations.   Coordination:    Normal finger to nose a  Gait:    Uses a walker, not shuffling or ataxic  Motor Observation:    No asymmetry, no atrophy, and no involuntary movements noted. Tone:    Normal muscle tone.    Posture:    Posture is normal. normal erect in chair, slihtly stooped with walker    Strength:    Strength is V/V in the upper and lower limbs.      Sensation: intact to LT     Reflex Exam:  DTR's:    Deep tendon reflexes in the upper and lower extremities are trace bilaterally.   Toes:    The toes are downgoing bilaterally.   Clonus:    Clonus is absent.    Assessment/Plan:  a 70 y.o. female here as requested by Michael Boston, MD for headaches. Here for dizziness, morning headaches. Past medical history  new daily persistent headache, prior bariatric surgery, fibromyalgia, hypertension, breast cancer, GERD, hyperlipidemia, coronary artery disease, renal cell cancer, migraine headache, history of bladder cancer, major depression, history of breast cancer, migraine without aura.  She is currently on topiramate. Denies migraines. Mri was unremarkable for any acute etiology.  Dizzy with standing: Orthostatic Hypotension: 154/94 P 77, 128/87 80, 120/85 90 (she endorsed dizziness today with standing and does not drink water). First thing recommend is increasing fluids. Then can consider compression stockings or management of BP meds see pcp  BPPV: when she rolls over to the left she gets dizzy, doesn't happen on the right.Physical Therapy for epley maneuvers  Sleep test: she wake up with a slight headache. She does not sleep well. She had a sleep study years ago and used a cpap but lost a lot of weight and stopped using it, she doesn't sleep well, she is extremely fatigued, doesn't feel refreshed in the morning, wakes often.  Orders Placed This Encounter  Procedures   Ambulatory referral to Physical Therapy   Ambulatory referral to Sleep Studies    No orders of the defined types were placed in this encounter.   Cc: Michael Boston, MD,  [name], MD  Sarina Ill, MD  [facility] [address] [place] [employer], [place] [address]  Phone [phone] Fax [phone]

## 2021-03-23 NOTE — Progress Notes (Signed)
Synopsis: Referred in August 2022 for Pulmonary Hypertension by Cristie Hem, MD  Subjective:   PATIENT ID: Heather Garrett GENDER: female DOB: 1950-09-17, MRN: [id number]   HPI  Chief Complaint  Patient presents with   Consult    Referred by PCP for possible pulmonary nodules and pulmonary HTN. States she has had the nodules for years. Notices the SOB with exertion. Has some weakness in both of her legs.    Heather Garrett is a 70 year old woman, former smoker with history of coronary artery disease, HFpEF, hypertension, iron deficiency anemia and renal cell cancer s/p resection who is referred to pulmonary clinic for shortness of breath and concern for pulmonary hypertension.  She reports developing shortness of breath with exertion over recent months. She also complains of significant fatigue with exertion that is relieved by taking naps. She also experiences leg heaviness with exertion. She denies any cough, wheezing or chest tightness with the dyspnea.   She was evaluated by neurology earlier today for imbalance and was noted to have orthostatic hypotension as well as issues with vertigo. There was also concern for obstructive sleep apnea.   She is a former smoker with a 10 pack year smoking history. She quit 30 years ago. 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   Past Medical History:  Diagnosis Date   Biceps tendinitis    Cobalamin deficiency    Coronary arteriosclerosis in native artery    Elevated liver function tests    Hyperlipidemia    Migraine    Rotator cuff syndrome    Transitional cell carcinoma, bladder (HCC)    Vitamin D deficiency      Family History  Problem Relation Age of Onset   Migraines Neg Hx      Social History   Socioeconomic History   Marital status: Married    Spouse name: Not on file   Number of children: Not on file   Years of education: Not on file   Highest education level: Not on file  Occupational  History   Not on file  Tobacco Use   Smoking status: Former    Passive exposure: Never   Smokeless tobacco: Never   Tobacco comments:    Quit 30 yrs ago  Vaping Use   Vaping Use: Never used  Substance and Sexual Activity   Alcohol use: Not Currently    Alcohol/week: 14.0 standard drinks    Types: 14 Glasses of wine per week   Drug use: Never   Sexual activity: Not on file  Other Topics Concern   Not on file  Social History Narrative   Not on file   Social Determinants of Health   Financial Resource Strain: Not on file  Food Insecurity: Not on file  Transportation Needs: Not on file  Physical Activity: Not on file  Stress: Not on file  Social Connections: Not on file  Intimate Partner Violence: Not on file     Allergies  Allergen Reactions   Contrast Media [Iodinated Diagnostic Agents] Anaphylaxis and Hives   Ciprofloxacin Other (See Comments)    Interaction w other medicine   Erythromycin Base Nausea And Vomiting   Sulfabenzamide Other (See Comments)    UNK reaction     Outpatient Medications Prior to Visit  Medication Sig Dispense Refill   acetaminophen (TYLENOL) 500 MG tablet Take 1,000 mg by mouth every 6 (six) hours as needed for mild pain.     aspirin 81  MG EC tablet Take 81 mg by mouth every other day. Swallow whole.     B Complex Vitamins (VITAMIN B COMPLEX PO) Take by mouth.     calcium carbonate (TUMS - DOSED IN MG ELEMENTAL CALCIUM) 500 MG chewable tablet Chew 1-2 tablets by mouth daily as needed for indigestion or heartburn.     carvedilol (COREG) 3.125 MG tablet Take 3.125 mg by mouth 2 (two) times daily.     ezetimibe-simvastatin (VYTORIN) 10-80 MG tablet Take 1 tablet by mouth daily.     lisinopril (ZESTRIL) 5 MG tablet Take 1 tablet (5 mg total) by mouth daily. 90 tablet 3   nitrofurantoin, macrocrystal-monohydrate, (MACROBID) 100 MG capsule Take 100 capsules by mouth in the morning and at bedtime.     omeprazole (PRILOSEC) 20 MG capsule Take 20 mg  by mouth 2 (two) times daily before a meal.     PEG-KCl-NaCl-NaSulf-Na Asc-C (PLENVU) 140 g SOLR Use as directed. 1 each 0   Polyethylene Glycol 400 (BLINK TEARS) 0.25 % SOLN Apply 1 drop to eye daily as needed (dry eyes).     Probiotic Product (PROBIOTIC PO) Take 1 tablet by mouth daily.     sertraline (ZOLOFT) 100 MG tablet Take 100 mg by mouth daily.     tiZANidine (ZANAFLEX) 4 MG capsule Take 4 mg by mouth at bedtime.     topiramate (TOPAMAX) 50 MG tablet Take 100 mg by mouth at bedtime.     Vitamin D, Ergocalciferol, (DRISDOL) 1.25 MG (50000 UNIT) CAPS capsule Take 50,000 Units by mouth once a week. Sunday     No facility-administered medications prior to visit.   Review of Systems  Constitutional:  Negative for chills, fever, malaise/fatigue and weight loss.  HENT:  Negative for congestion, sinus pain and sore throat.   Eyes: Negative.   Respiratory:  Positive for cough and shortness of breath. Negative for hemoptysis, sputum production and wheezing.   Cardiovascular:  Negative for chest pain, palpitations, orthopnea, claudication and leg swelling.  Gastrointestinal:  Negative for abdominal pain, heartburn, nausea and vomiting.       Loss of appetite  Genitourinary: Negative.   Musculoskeletal:  Negative for joint pain and myalgias.  Skin:  Negative for rash.  Neurological:  Positive for headaches. Negative for weakness.  Endo/Heme/Allergies:  Positive for environmental allergies.  Psychiatric/Behavioral:  Positive for depression.    Objective:   Vitals:   03/23/21 1136  BP: 116/72  Pulse: 84  SpO2: 99%  Weight: 96 lb 9.6 oz (43.8 kg)  Height: _0  (1.422 m)   Physical Exam Constitutional:      General: She is not in acute distress.    Appearance: She is not ill-appearing.  HENT:     Head: Normocephalic and atraumatic.  Eyes:     General: No scleral icterus.    Conjunctiva/sclera: Conjunctivae normal.     Pupils: Pupils are equal, round, and reactive to light.   Cardiovascular:     Rate and Rhythm: Normal rate and regular rhythm.     Pulses: Normal pulses.     Heart sounds: Normal heart sounds. No murmur heard. Pulmonary:     Effort: Pulmonary effort is normal.     Breath sounds: Decreased breath sounds present. No wheezing, rhonchi or rales.  Abdominal:     General: Bowel sounds are normal.     Palpations: Abdomen is soft.  Musculoskeletal:     Right lower leg: No edema.     Left lower leg: No  edema.     Comments: Kyphosis of thoracic spine  Lymphadenopathy:     Cervical: No cervical adenopathy.  Skin:    General: Skin is warm and dry.  Neurological:     General: No focal deficit present.     Mental Status: She is alert.  Psychiatric:        Mood and Affect: Mood normal.        Behavior: Behavior normal.        Thought Content: Thought content normal.        Judgment: Judgment normal.    CBC    Component Value Date/Time   WBC 8.1 01/26/2021 0900   RBC 3.56 (L) 01/26/2021 0900   HGB 11.9 (L) 01/26/2021 0900   HGB 10.9 (L) 11/25/2020 1219   HCT 36.5 01/26/2021 0900   HCT 33.9 (L) 11/25/2020 1219   PLT 203 01/26/2021 0900   PLT 180 11/25/2020 1219   MCV 102.5 (H) 01/26/2021 0900   MCV 97 11/25/2020 1219   MCH 33.4 01/26/2021 0900   MCHC 32.6 01/26/2021 0900   RDW 15.7 (H) 01/26/2021 0900   RDW 14.2 11/25/2020 1219   LYMPHSABS 1.5 01/26/2021 0900   MONOABS 1.0 01/26/2021 0900   EOSABS 0.1 01/26/2021 0900   BASOSABS 0.0 01/26/2021 0900   BMP Latest Ref Rng & Units 01/26/2021 12/08/2020 11/25/2020  Glucose 70 - 99 mg/dL 88 96 104(H)  BUN 8 - 23 mg/dL _0 Creatinine 0.44 - 1.00 mg/dL 0.74 0.85 0.78  BUN/Creat Ratio 12 - 28 - 9(L) 15  Sodium 135 - 145 mmol/L 138 142 136  Potassium 3.5 - 5.1 mmol/L 4.7 4.1 4.4  Chloride 98 - 111 mmol/L 106 103 103  CO2 22 - 32 mmol/L 24 21 19(L)  Calcium 8.9 - 10.3 mg/dL 8.9 8.9 8.6(L)   Chest imaging: CT Chest 01/02/21 Extensive coronary calcifications. The central pulmonary  arteries are enlarged. Surgical Rous-en-Y gastric bypass identified with herniation of the gastric pouch into the thoracic compartment. 33m ground glass nodule seen within the suprior segment of the left lower lobe. 366mnodule within the medial right lung base. Mild subpleural retriculation seen in the right lower lobe with parenchymal banding. Kyphosis of thoracic spin.  PFT: No flowsheet data found.  Ech[place]/24/20 [place], Gr 1 DD, mod LAE, AV sclerosis w/o AS, MAC w trace MR, PASP 33    Assessment & Plan:   Shortness of breath - Plan: Pulmonary Function Test  Discussion: KaPaisley Garrett a [age]ear old woman, former smoker with history of coronary artery disease, HFpEF, hypertension, iron deficiency anemia and renal cell cancer s/p resection who is referred to pulmonary clinic for shortness of breath and concern for pulmonary hypertension.  The pulmonary artery is noted to be enlarged on CT Chest but her echo from 09/2020 with normal PASP at 3394m. I am less concerned for pulmonary hypertension based on her echo results.  We will obtain pulmonary function tests to further evaluate her shortness of breath. 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 There is concern for sleep disordered breathing and will under go sleep testing through her neurology team. She does not have ambulatory desaturations with completing 1 lap in clinic today.   Follow up in 3 months.  JonFreda JacksonD [facility] Office: [phone] Outpatient Medications:    acetaminophen (TYLENOL) 500 MG tablet, Take  1,000 mg by mouth every 6 (six) hours as needed for mild pain., Disp: , Rfl:    aspirin 81 MG EC tablet, Take 81 mg by mouth every other day. Swallow whole., Disp: , Rfl:    B Complex Vitamins (VITAMIN B COMPLEX PO), Take by mouth., Disp: , Rfl:    calcium carbonate (TUMS -  DOSED IN MG ELEMENTAL CALCIUM) 500 MG chewable tablet, Chew 1-2 tablets by mouth daily as needed for indigestion or heartburn., Disp: , Rfl:    carvedilol (COREG) 3.125 MG tablet, Take 3.125 mg by mouth 2 (two) times daily., Disp: , Rfl:    ezetimibe-simvastatin (VYTORIN) 10-80 MG tablet, Take 1 tablet by mouth daily., Disp: , Rfl:    lisinopril (ZESTRIL) 5 MG tablet, Take 1 tablet (5 mg total) by mouth daily., Disp: 90 tablet, Rfl: 3   nitrofurantoin, macrocrystal-monohydrate, (MACROBID) 100 MG capsule, Take 100 capsules by mouth in the morning and at bedtime., Disp: , Rfl:    omeprazole (PRILOSEC) 20 MG capsule, Take 20 mg by mouth 2 (two) times daily before a meal., Disp: , Rfl:    PEG-KCl-NaCl-NaSulf-Na Asc-C (PLENVU) 140 g SOLR, Use as directed., Disp: 1 each, Rfl: 0   Polyethylene Glycol 400 (BLINK TEARS) 0.25 % SOLN, Apply 1 drop to eye daily as needed (dry eyes)., Disp: , Rfl:    Probiotic Product (PROBIOTIC PO), Take 1 tablet by mouth daily., Disp: , Rfl:    sertraline (ZOLOFT) 100 MG tablet, Take 100 mg by mouth daily., Disp: , Rfl:    tiZANidine (ZANAFLEX) 4 MG capsule, Take 4 mg by mouth at bedtime., Disp: , Rfl:    topiramate (TOPAMAX) 50 MG tablet, Take 100 mg by mouth at bedtime., Disp: , Rfl:    Vitamin D, Ergocalciferol, (DRISDOL) 1.25 MG (50000 UNIT) CAPS capsule, Take 50,000 Units by mouth once a week. Sunday, Disp: , Rfl:

## 2021-03-23 NOTE — Patient Instructions (Addendum)
Vertigo : Send you to physical therapy for [facility] [employer] Address: [address], [place], [place] [address] Hours:  Open ? Closes 6PM Phone: [phone] Sleep evaluation  Sleep Apnea Sleep apnea is a condition in which breathing pauses or becomes shallow during sleep. People with sleep apnea usually snore loudly. They may have times when they gasp and stop breathing for 10 seconds or more during sleep. This may happen many times during the night. Sleep apnea disrupts your sleep and keeps your body from getting the rest that it needs. This condition can increase your risk of certain health problems, including: Heart attack. Stroke. Obesity. Type 2 diabetes. Heart failure. Irregular heartbeat. High blood pressure. The goal of treatment is to help you breathe normally again. What are the causes? The most common cause of sleep apnea is a collapsed or blocked airway. There are three kinds of sleep apnea: Obstructive sleep apnea. This kind is caused by a blocked or collapsed airway. Central sleep apnea. This kind happens when the part of the brain that controls breathing does not send the correct signals to the muscles that control breathing. Mixed sleep apnea. This is a combination of obstructive and central sleep apnea. What increases the risk? You are more likely to develop this condition if you: Are overweight. Smoke. Have a smaller than normal airway. Are older. Are female. Drink alcohol. Take sedatives or tranquilizers. Have a family history of sleep apnea. Have a tongue or tonsils that are larger than normal. What are the signs or symptoms? Symptoms of this condition include: Trouble staying asleep. Loud snoring. Morning headaches. Waking up gasping. Dry mouth or sore throat in the morning. Daytime sleepiness and tiredness. If you have daytime fatigue because of sleep apnea, you may be more likely to have: Trouble  concentrating. Forgetfulness. Irritability or mood swings. Personality changes. Feelings of depression. Sexual dysfunction. This may include loss of interest if you are female, or erectile dysfunction if you are female. How is this diagnosed? This condition may be diagnosed with: A medical history. A physical exam. A series of tests that are done while you are sleeping (sleep study). These tests are usually done in a sleep lab, but they may also be done at home. How is this treated? Treatment for this condition aims to restore normal breathing and to ease symptoms during sleep. It may involve managing health issues that can affect breathing, such as high blood pressure or obesity. Treatment may include: Sleeping on your side. Using a decongestant if you have nasal congestion. Avoiding the use of depressants, including alcohol, sedatives, and narcotics. Losing weight if you are overweight. Making changes to your diet. Quitting smoking. Using a device to open your airway while you sleep, such as: An oral appliance. This is a custom-made mouthpiece that shifts your lower jaw forward. A continuous positive airway pressure (CPAP) device. This device blows air through a mask when you breathe out (exhale). A nasal expiratory positive airway pressure (EPAP) device. This device has valves that you put into each nostril. A bi-level positive airway pressure (BPAP) device. This device blows air through a mask when you breathe in (inhale) and breathe out (exhale). Having surgery if other treatments do not work. During surgery, excess tissue is removed to create a wider airway. Follow these instructions at home: Lifestyle Make any lifestyle changes that your health care provider recommends. Eat a healthy, well-balanced diet. Take steps to lose weight if you are overweight. Avoid using depressants, including  alcohol, sedatives, and narcotics. Do not use any products that contain nicotine or tobacco.  These products include cigarettes, chewing tobacco, and vaping devices, such as e-cigarettes. If you need help quitting, ask your health care provider. General instructions Take over-the-counter and prescription medicines only as told by your health care provider. If you were given a device to open your airway while you sleep, use it only as told by your health care provider. If you are having surgery, make sure to tell your health care provider you have sleep apnea. You may need to bring your device with you. Keep all follow-up visits. This is important. Contact a health care provider if: The device that you received to open your airway during sleep is uncomfortable or does not seem to be working. Your symptoms do not improve. Your symptoms get worse. Get help right away if: You develop: Chest pain. Shortness of breath. Discomfort in your back, arms, or stomach. You have: Trouble speaking. Weakness on one side of your body. Drooping in your face. These symptoms may represent a serious problem that is an emergency. Do not wait to see if the symptoms will go away. Get medical help right away. Call your local emergency services (911 in the [place] Do not drive yourself to the hospital. Summary Sleep apnea is a condition in which breathing pauses or becomes shallow during sleep. The most common cause is a collapsed or blocked airway. The goal of treatment is to restore normal breathing and to ease symptoms during sleep. This information is not intended to replace advice given to you by your health care provider. Make sure you discuss any questions you have with your health care provider. Document Revised: 06/05/2020 Document Reviewed: 06/05/2020 Elsevier Patient Education  2022 [place].   Orthostatic Hypotension Blood pressure is a measurement of how strongly, or weakly, your circulating blood is pressing against the walls of your arteries. Orthostatic hypotension is a drop in blood  pressure that can happen when you change positions, such as when you go from lying down to standing. Arteries are blood vessels that carry blood from your heart throughout your body. When blood pressure is too low, you may not get enough blood to your brain or to the rest of your organs. Orthostatic hypotension can cause light-headedness, sweating, rapid heartbeat, blurred vision, and fainting. These symptoms require further investigation into the cause. What are the causes? Orthostatic hypotension can be caused by many things, including: Sudden changes in posture, such as standing up quickly after you have been sitting or lying down. Loss of blood (anemia) or loss of body fluids (dehydration). Heart problems, neurologic problems, or hormone problems. Pregnancy. Aging. The risk for this condition increases as you get older. Severe infection (sepsis). Certain medicines, such as medicines for high blood pressure or medicines that make the body lose excess fluids (diuretics). What are the signs or symptoms? Symptoms of this condition may include: Weakness, light-headedness, or dizziness. Sweating. Blurred vision. Tiredness (fatigue). Rapid heartbeat. Fainting, in severe cases. How is this diagnosed? This condition is diagnosed based on: Your symptoms and medical history. Your blood pressure measurements. Your health care provider will check your blood pressure when you are: Lying down. Sitting. Standing. A blood pressure reading is recorded as two numbers, such as "120 over 80" (or 120/80). The first ("top") number is called the systolic pressure. It is a measure of the pressure in your arteries as your heart beats. The second ("bottom") number is called the diastolic pressure. It  is a measure of the pressure in your arteries when your heart relaxes between beats. Blood pressure is measured in a unit called mmHg. Healthy blood pressure for most adults is 120/80 mmHg. Orthostatic hypotension is  defined as a 20 mmHg drop in systolic pressure or a 10 mmHg drop in diastolic pressure within 3 minutes of standing. Other information or tests that may be used to diagnose orthostatic hypotension include: Your other vital signs, such as your heart rate and temperature. Blood tests. An electrocardiogram (ECG) or echocardiogram. A Holter monitor. This is a device you wear that records your heart rhythm continuously, usually for 24-48 hours. Tilt table test. For this test, you will be safely secured to a table that moves you from a lying position to an upright position. Your heart rhythm and blood pressure will be monitored during the test. How is this treated? This condition may be treated by: Changing your diet. This may involve eating more salt (sodium) or drinking more water. Changing the dosage of certain medicines you are taking that might be lowering your blood pressure. Correcting the underlying reason for the orthostatic hypotension. Wearing compression stockings. Taking medicines to raise your blood pressure. Avoiding actions that trigger symptoms. Follow these instructions at home: Medicines Take over-the-counter and prescription medicines only as told by your health care provider. Follow instructions from your health care provider about changing the dosage of your current medicines, if this applies. Do not stop or adjust any of your medicines on your own. Eating and drinking  Drink enough fluid to keep your urine pale yellow. Eat extra salt only as directed. Do not add extra salt to your diet unless advised by your health care provider. Eat frequent, small meals. Avoid standing up suddenly after eating. General instructions  Get up slowly from lying down or sitting positions. This gives your blood pressure a chance to adjust. Avoid hot showers and excessive heat as directed by your health care provider. Engage in regular physical activity as directed by your health care  provider. If you have compression stockings, wear them as told. Keep all follow-up visits. This is important. Contact a health care provider if: You have a fever for more than 2-3 days. You feel more thirsty than usual. You feel dizzy or weak. Get help right away if: You have chest pain. You have a fast or irregular heartbeat. You become sweaty or feel light-headed. You feel short of breath. You faint. You have any symptoms of a stroke. "BE FAST" is an easy way to remember the main warning signs of a stroke: B - Balance. Signs are dizziness, sudden trouble walking, or loss of balance. E - Eyes. Signs are trouble seeing or a sudden change in vision. F - Face. Signs are sudden weakness or numbness of the face, or the face or eyelid drooping on one side. A - Arms. Signs are weakness or numbness in an arm. This happens suddenly and usually on one side of the body. S - Speech. Signs are sudden trouble speaking, slurred speech, or trouble understanding what people say. T - Time. Time to call emergency services. Write down what time symptoms started. You have other signs of a stroke, such as: A sudden, severe headache with no known cause. Nausea or vomiting. Seizure. These symptoms may represent a serious problem that is an emergency. Do not wait to see if the symptoms will go away. Get medical help right away. Call your local emergency services (911 in the [place] Do not  drive yourself to the hospital. Summary Orthostatic hypotension is a sudden drop in blood pressure. It can cause light-headedness, sweating, rapid heartbeat, blurred vision, and fainting. Orthostatic hypotension can be diagnosed by having your blood pressure taken while lying down, sitting, and then standing. Treatment may involve changing your diet, wearing compression stockings, sitting up slowly, adjusting your medicines, or correcting the underlying reason for the orthostatic hypotension. Get help right away if you have  chest pain, a fast or irregular heartbeat, or symptoms of a stroke. This information is not intended to replace advice given to you by your health care provider. Make sure you discuss any questions you have with your health care provider. Document Revised: 09/10/2020 Document Reviewed: 09/10/2020 Elsevier Patient Education  [place]. Benign Positional Vertigo Vertigo is the feeling that you or your surroundings are moving when they are not. Benign positional vertigo is the most common form of vertigo. This is usually a harmless condition (benign). This condition is positional. This means that symptoms are triggered by certain movements and positions. This condition can be dangerous if it occurs while you are doing something that could cause harm to yourself or others. This includes activities such as driving or operating machinery. What are the causes? The inner ear has fluid-filled canals that help your brain sense movement and balance. When the fluid moves, the brain receives messages about your body's position. With benign positional vertigo, calcium crystals in the inner ear break free and disturb the inner ear area. This causes your brain to receive confusing messages about your body's position. What increases the risk? You are more likely to develop this condition if: You are a woman. You are 75 years of age or older. You have recently had a head injury. You have an inner ear disease. What are the signs or symptoms? Symptoms of this condition usually happen when you move your head or your eyes in different directions. Symptoms may start suddenly and usually last for less than a minute. They include: Loss of balance and falling. Feeling like you are spinning or moving. Feeling like your surroundings are spinning or moving. Nausea and vomiting. Blurred vision. Dizziness. Involuntary eye movement (nystagmus). Symptoms can be mild and cause only minor problems, or they can be  severe and interfere with daily life. Episodes of benign positional vertigo may return (recur) over time. Symptoms may also improve over time. How is this diagnosed? This condition may be diagnosed based on: Your medical history. A physical exam of the head, neck, and ears. Positional tests to check for or stimulate vertigo. You may be asked to turn your head and change positions, such as going from sitting to lying down. A health care provider will watch for symptoms of vertigo. You may be referred to a health care provider who specializes in ear, nose, and throat problems (ENT or otolaryngologist) or a provider who specializes in disorders of the nervous system (neurologist). How is this treated? This condition may be treated in a session in which your health care provider moves your head in specific positions to help the displaced crystals in your inner ear move. Treatment for this condition may take several sessions. Surgery may be needed in severe cases, but this is rare. In some cases, benign positional vertigo may resolve on its own in 2-4 weeks. Follow these instructions at home: Safety Move slowly. Avoid sudden body or head movements or certain positions, as told by your health care provider. Avoid driving or operating machinery until  your health care provider says it is safe. Avoid doing any tasks that would be dangerous to you or others if vertigo occurs. If you have trouble walking or keeping your balance, try using a cane for stability. If you feel dizzy or unstable, sit down right away. Return to your normal activities as told by your health care provider. Ask your health care provider what activities are safe for you. General instructions Take over-the-counter and prescription medicines only as told by your health care provider. Drink enough fluid to keep your urine pale yellow. Keep all follow-up visits. This is important. Contact a health care provider if: You have a  fever. Your condition gets worse or you develop new symptoms. Your family or friends notice any behavioral changes. You have nausea or vomiting that gets worse. You have numbness or a prickling and tingling sensation. Get help right away if you: Have difficulty speaking or moving. Are always dizzy or faint. Develop severe headaches. Have weakness in your legs or arms. Have changes in your hearing or vision. Develop a stiff neck. Develop sensitivity to light. These symptoms may represent a serious problem that is an emergency. Do not wait to see if the symptoms will go away. Get medical help right away. Call your local emergency services (911 in the [place] Do not drive yourself to the hospital. Summary Vertigo is the feeling that you or your surroundings are moving when they are not. Benign positional vertigo is the most common form of vertigo. This condition is caused by calcium crystals in the inner ear that become displaced. This causes a disturbance in an area of the inner ear that helps your brain sense movement and balance. Symptoms include loss of balance and falling, feeling that you or your surroundings are moving, nausea and vomiting, and blurred vision. This condition can be diagnosed based on symptoms, a physical exam, and positional tests. Follow safety instructions as told by your health care provider and keep all follow-up visits. This is important. This information is not intended to replace advice given to you by your health care provider. Make sure you discuss any questions you have with your health care provider. Document Revised: 05/27/2020 Document Reviewed: 05/27/2020 Elsevier Patient Education  2022 [employer].

## 2021-03-29 ENCOUNTER — Telehealth: Payer: Self-pay | Admitting: Neurology

## 2021-03-29 NOTE — Telephone Encounter (Signed)
Pt called back she states she already has an appt for PT. No need to call her back.

## 2021-03-29 NOTE — Telephone Encounter (Signed)
Pt called, [facility] by Dr[facility] Jaynee Eagles said they do not do therapy for vertigo, was transferred back to [place]. I was told by someone at [facility] would need to do a new evaluation to be referred to another facility. Would like a call from the nurse to discuss if I need a new evaluation.

## 2021-03-31 ENCOUNTER — Telehealth: Payer: Self-pay | Admitting: Gastroenterology

## 2021-03-31 ENCOUNTER — Telehealth: Payer: Self-pay | Admitting: Neurology

## 2021-03-31 NOTE — Telephone Encounter (Signed)
Hey Dr. Candis Garrett,  Patient called in unaware of EGD/Colon tomorrow 9/22. Heather Garrett was under the impression just an Endo. Patient have been eating corn, popcorn, salad with nuts, etc today 9/21. We offered for her to still come for Endo but patient wanted both together. We rescheduled for 11/17.  Thank you.

## 2021-03-31 NOTE — Telephone Encounter (Signed)
Called patient to let her know that new prep instructions were on mychart. Confirmed patient still had prep. Patient had no questions at the end of call.

## 2021-03-31 NOTE — Telephone Encounter (Signed)
Patient rescheduled egd/colon for 11/17. Wouls like new instructions

## 2021-03-31 NOTE — Telephone Encounter (Signed)
FYI- pt wanted to inform Dr. Jaynee Eagles that she was not able to get in for a Sleep Consult until November.

## 2021-03-31 NOTE — Telephone Encounter (Signed)
noted 

## 2021-04-01 ENCOUNTER — Encounter: Payer: Medicare PPO | Admitting: Gastroenterology

## 2021-04-02 ENCOUNTER — Other Ambulatory Visit: Payer: Self-pay

## 2021-04-02 ENCOUNTER — Ambulatory Visit: Payer: Medicare PPO | Attending: Neurology

## 2021-04-02 DIAGNOSIS — M6281 Muscle weakness (generalized): Secondary | ICD-10-CM | POA: Diagnosis present

## 2021-04-02 DIAGNOSIS — H8112 Benign paroxysmal vertigo, left ear: Secondary | ICD-10-CM | POA: Diagnosis present

## 2021-04-02 DIAGNOSIS — R2681 Unsteadiness on feet: Secondary | ICD-10-CM

## 2021-04-02 DIAGNOSIS — R42 Dizziness and giddiness: Secondary | ICD-10-CM

## 2021-04-02 NOTE — Therapy (Signed)
[facility] [address] [place], [place], [address] Phone: [phone]   Fax:  [phone]  Physical Therapy Evaluation  Patient Details  Name: Heather Garrett MRN: [id number] Date of Birth: 1951-04-17 Referring Provider (PT): Sarina Ill, MD   Encounter Date: 04/02/2021   PT End of Session - 04/02/21 0746     Visit Number 1    Number of Visits 9    Date for PT Re-Evaluation 05/28/21    Authorization Type [employer]    Authorization Time Period Awaiting Authorization    Progress Note Due on Visit 10    PT Start Time [phone]    PT Stop Time 0830    PT Time Calculation (min) 43 min    Activity Tolerance Patient tolerated treatment well   mild nausea   Behavior During Therapy WFL for tasks assessed/performed             Past Medical History:  Diagnosis Date   Biceps tendinitis    Cobalamin deficiency    Coronary arteriosclerosis in native artery    Elevated liver function tests    Hyperlipidemia    Migraine    Rotator cuff syndrome    Transitional cell carcinoma, bladder (HCC)    Vitamin D deficiency     Past Surgical History:  Procedure Laterality Date   BREAST SURGERY     CARDIAC CATHETERIZATION     CATARACT EXTRACTION     CHOLECYSTECTOMY  1979   DG  BONE DENSITY ([place] HX)     ENDOMETRIAL ABLATION     GASTRIC BYPASS     MASTECTOMY     OTHER SURGICAL HISTORY     CANCER SURGERY    There were no vitals filed for this visit.    Subjective Assessment - 04/02/21 0747     Subjective Patient reports the dizziness started a few months ago, was not sure it was vertigo. Patient reports she feels dizzy all day, but is worse lying in bed. Patient reports more dizziness with turning to the left. Reports spinning sensation along with nausea. Patient reports that the dizziness last less than a minute. Patient reports that she had a fall outdoors. Reports that her balance has been off as well. Denies  aural fullness and ringing.    Pertinent History Biceps Tendinitis, Rotator Cuff Syndrome, HLD, Migraine, Vitamin D    Limitations Walking;Standing;House hold activities    Patient Stated Goals resolve the dizziness    Currently in Pain? No/denies                [facility] PT Assessment - 04/02/21 0001       Assessment   Medical Diagnosis BPPV    Referring Provider (PT) Sarina Ill, MD    Onset Date/Surgical Date 03/23/21    Prior Therapy Prior PT for Balance      Precautions   Precautions Other (comment);Fall    Precaution Comments Biceps Tendinitis, Rotator Cuff Syndrome, HLD, Migraine, Vitamin D      Balance Screen   Has the patient fallen in the past 6 months Yes    How many times? 2    Has the patient had a decrease in activity level because of a fear of falling?  No    Is the patient reluctant to leave their home because of a fear of falling?  No      Home Ecologist residence    Living Arrangements Spouse/significant other  Available Help at Discharge Family    Type of [place] Access Level entry    [place] One level    [place] - 4 wheels;[place] - single point    Additional Comments patient is using rollator more for community mobility; also depends upon dizziness      Prior Function   Level of Independence Independent with community mobility with device;Independent with household mobility with device      Cognition   Overall Cognitive Status Within Functional Limits for tasks assessed      Observation/Other Assessments   Focus on Therapeutic Outcomes (FOTO)  DPS: 48, DFS: 43.6      ROM / Strength   AROM / PROM / Strength Strength      Strength   Overall Strength Deficits    Overall Strength Comments no formal measurements/MMT; decreased strength noted with functional mobility      Transfers   Transfers Sit to Stand;Stand to Sit    Sit to Stand 5: Supervision;With upper extremity assist;From  chair/3-in-1    Stand to Sit 5: Supervision;With armrests;With upper extremity assist;To chair/3-in-1      Ambulation/Gait   Ambulation/Gait Yes    Ambulation/Gait Assistance 5: Supervision    Ambulation/Gait Assistance Details ambulation into/out of evaluation with rollator; in room with SPC. mild unsteadiness    Assistive device Rollator;Straight cane    Ambulation Surface Level;Indoor                    Vestibular Assessment - 04/02/21 0001       Symptom Behavior   Subjective history of current problem Dr. Jaynee Eagles tested for Orthostatics with BP drop noted, currently applying and wearing compression stockings. Reports HA.    Type of Dizziness  Spinning;Vertigo;Lightheadedness    Frequency of Dizziness daily    Duration of Dizziness < 1 minute    Symptom Nature Motion provoked;Intermittent    Aggravating Factors Lying supine;Forward bending;Rolling to right;Rolling to left;Turning head quickly;Turning body quickly    Relieving Factors Slow movements;Rest    Progression of Symptoms No change since onset      Oculomotor Exam   Oculomotor Alignment Normal    Ocular ROM WNL    Spontaneous Absent    Gaze-induced  Absent    Smooth Pursuits Intact    Saccades Intact      Oculomotor Exam-Fixation Suppressed    Left Head Impulse unable to assess due to inability to allow PT to passively rotate neck    Right Head Impulse unable to assess due to inability to allow PT to passively rotate neck      Vestibulo-Ocular Reflex   VOR 1 Head Only (x 1 viewing) Normal; Mild Dizziness    VOR Cancellation Normal   mild dizziness     Positional Testing   Dix-Hallpike Dix-Hallpike Right;Dix-Hallpike Left    Horizontal Canal Testing Horizontal Canal Right;Horizontal Canal Left      Dix-Hallpike Right   Dix-Hallpike Right Duration 0    Dix-Hallpike Right Symptoms No nystagmus      Dix-Hallpike Left   Dix-Hallpike Left Duration 15    Dix-Hallpike Left Symptoms Upbeat, left rotatory  nystagmus      Horizontal Canal Right   Horizontal Canal Right Duration 0    Horizontal Canal Right Symptoms Normal      Horizontal Canal Left   Horizontal Canal Left Duration 0    Horizontal Canal Left Symptoms Normal      Positional Sensitivities  Right Hallpike No dizziness    Up from Right Hallpike Lightheadedness    Up from Left Hallpike Lightheadedness      Orthostatics   Orthostatics Comment has history of orthostatic hypotension; currently wears compression stockings                Objective measurements completed on examination: See above findings.        Vestibular Treatment/Exercise - 04/02/21 0001       Vestibular Treatment/Exercise   Vestibular Treatment Provided Canalith Repositioning    Canalith Repositioning Epley Manuever Left       EPLEY MANUEVER LEFT   Number of Reps  1    Overall Response  Symptoms Resolved     RESPONSE DETAILS LEFT no nystagmus on reassesment                    PT Education - 04/02/21 0834     Education Details POC/Evaluation Findings    Person(s) Educated Patient    Methods Explanation    Comprehension Verbalized understanding              PT Short Term Goals - 04/02/21 0836       PT SHORT TERM GOAL #1   Title Patient will undergo further balance assesment (Berg Balance) and LTG to be set as applicable (ALL STGS: 64/33/29)    Baseline TBA    Time 4    Period Weeks    Status New    Target Date 04/30/21      PT SHORT TERM GOAL #2   Title Patient will undergo further vestibular testing (DVA/MSQ) and set LTG as applicable    Baseline TBA    Time 4    Period Weeks    Status New      PT SHORT TERM GOAL #3   Title Patient will demo (-) positional testing to indicate resolution of BPPV    Baseline L BPPV    Time 4    Period Weeks    Status New    Target Date 04/30/21               PT Long Term Goals - 04/02/21 0838       PT LONG TERM GOAL #1   Title Patient will be independent  with vestibular/balance HEP (ALL LTGs Due: 05/28/21)    Baseline no HEP established    Time 8    Period Weeks    Status New    Target Date 05/28/21      PT LONG TERM GOAL #2   Title LTG to be set for DVA/MSQ    Baseline TBA    Time 8    Period Weeks    Status New      PT LONG TERM GOAL #3   Title LTG to be set for [employer]    Baseline TBA    Time 8    Period Weeks    Status New      PT LONG TERM GOAL #4   Title Patient will verbalize understanding on fall prevention strategies to promote safety within the home and community    Baseline dependent    Time 8    Period Weeks    Status New      PT LONG TERM GOAL #5   Title Patient will improve DPS >/= 58    Baseline 48    Time 8    Period Weeks    Status New  Plan - 04/02/21 0840     Clinical Impression Statement Patient is a 70 y.o. female referred to Neuro OPPT services for BPPV/Vertigo. PMH significant for the following: Biceps Tendinitis, Rotator Cuff Syndrome, HLD, Migraine, Vitamin D. Upon evaluation patient presenting with the following impairments: decreased strength, decreased functional mobility, decreased balance, dizziness, and L Upbeating Nystagmus of short duration indicating L Posterior Canal Canalithiasis. Completed CRM x 1 rep with resolution noted. Patient will benefit from further vestibular and balance assesment to reduce fall risk. Patient will benefit from skilled PT services to address following impairments, improved activity tolernace and reduce fall risk    Personal Factors and Comorbidities Comorbidity 3+    Comorbidities Biceps Tendinitis, Rotator Cuff Syndrome, HLD, Migraine, Vitamin D    Examination-Activity Limitations Bed Mobility;Reach Overhead;Transfers;Stand;Locomotion Level    Examination-Participation Restrictions Community Activity;Laundry;Cleaning    Stability/Clinical Decision Making Stable/Uncomplicated    Clinical Decision Making Low    Rehab  Potential Good    PT Frequency 1x / week    PT Duration 8 weeks    PT Treatment/Interventions ADLs/Self Care Home Management;Canalith Repostioning;Cryotherapy;Electrical Stimulation;Moist Heat;DME Instruction;Gait training;Stair training;Functional mobility training;Therapeutic activities;Therapeutic exercise;Balance training;Neuromuscular re-education;Patient/family education;Manual techniques;Dry needling;Passive range of motion;Vestibular    PT Next Visit Plan Reassess L BPPV and treat as indiciated. Assess DVA/Finish MSQ. Berg Balance. Update Goals. intiiate HEP upon resolution of BPPV    Consulted and Agree with Plan of Care Patient             Patient will benefit from skilled therapeutic intervention in order to improve the following deficits and impairments:  Dizziness, Decreased strength, Decreased mobility, Decreased balance, Decreased activity tolerance, Difficulty walking, Abnormal gait  Visit Diagnosis: Dizziness and giddiness  BPPV (benign paroxysmal positional vertigo), left  Unsteadiness on feet  Muscle weakness (generalized)     Problem List Patient Active Problem List   Diagnosis Date Noted   Benign paroxysmal positional vertigo of left ear 03/23/2021   Daytime somnolence 03/23/2021   History of sleep apnea 03/23/2021   Orthostatic hypotension 03/23/2021   Migraine without aura and without status migrainosus, not intractable 03/23/2021    Jones Bales, PT, DPT 04/02/2021, 8:45 AM  [facility] [address] [place], [place], [address] Phone: [phone]   Fax:  [phone]  Name: Heather Garrett MRN: [id number] Date of Birth: October 14, 1950

## 2021-04-05 ENCOUNTER — Institutional Professional Consult (permissible substitution): Payer: Medicare PPO | Admitting: Neurology

## 2021-04-12 ENCOUNTER — Other Ambulatory Visit: Payer: Self-pay

## 2021-04-12 ENCOUNTER — Ambulatory Visit: Payer: Medicare PPO | Admitting: Physical Therapy

## 2021-04-21 ENCOUNTER — Other Ambulatory Visit: Payer: Self-pay

## 2021-04-21 ENCOUNTER — Ambulatory Visit: Payer: Medicare PPO | Attending: Neurology

## 2021-04-21 DIAGNOSIS — R42 Dizziness and giddiness: Secondary | ICD-10-CM | POA: Diagnosis not present

## 2021-04-21 DIAGNOSIS — M6281 Muscle weakness (generalized): Secondary | ICD-10-CM | POA: Diagnosis present

## 2021-04-21 DIAGNOSIS — R2681 Unsteadiness on feet: Secondary | ICD-10-CM | POA: Diagnosis present

## 2021-04-21 DIAGNOSIS — H8112 Benign paroxysmal vertigo, left ear: Secondary | ICD-10-CM | POA: Diagnosis present

## 2021-04-21 NOTE — Patient Instructions (Signed)
Gaze Stabilization: Sitting ° ° ° °Keeping eyes on target on wall 3-4 feet away, tilt head down 15-30° and move head side to side for 30 seconds. Repeat while moving head up and down for 30 seconds. °Do 2-3 sessions per day. ° ° °Gaze Stabilization: Tip Card ° °1.Target must remain in focus, not blurry, and appear stationary while head is in motion. °2.Perform exercises with small head movements (45° to either side of midline). °3.Increase speed of head motion so long as target is in focus. °4.If you wear eyeglasses, be sure you can see target through lens (therapist will give specific instructions for bifocal / progressive lenses). °5.These exercises may provoke dizziness or nausea. Work through these symptoms. If too dizzy, slow head movement slightly. Rest between each exercise. °6.Exercises demand concentration; avoid distractions. °7.For safety, perform standing exercises close to a counter, wall, corner, or next to someone. ° °Copyright © V[employer]. All rights reserved.  ° °

## 2021-04-21 NOTE — Therapy (Signed)
[facility] [address] [place], [place], [address] Phone: [phone]   Fax:  [phone]  Physical Therapy Treatment  Patient Details  Name: Heather Garrett MRN: [id number] Date of Birth: 16-Nov-1950 Referring Provider (PT): Sarina Ill, MD   Encounter Date: 04/21/2021   PT End of Session - 04/21/21 0802     Visit Number 2    Number of Visits 9    Date for PT Re-Evaluation 05/28/21    Authorization Type [employer]    Authorization Time Period Awaiting Authorization    Progress Note Due on Visit 10    PT Start Time 0802    PT Stop Time 0842    PT Time Calculation (min) 40 min    Activity Tolerance Patient tolerated treatment well   mild nausea   Behavior During Therapy WFL for tasks assessed/performed             Past Medical History:  Diagnosis Date   Biceps tendinitis    Cobalamin deficiency    Coronary arteriosclerosis in native artery    Elevated liver function tests    Hyperlipidemia    Migraine    Rotator cuff syndrome    Transitional cell carcinoma, bladder ([place])    Vitamin D deficiency     Past Surgical History:  Procedure Laterality Date   BREAST SURGERY     CARDIAC CATHETERIZATION     CATARACT EXTRACTION     CHOLECYSTECTOMY  1979   DG  BONE DENSITY ([place] HX)     ENDOMETRIAL ABLATION     GASTRIC BYPASS     MASTECTOMY     OTHER SURGICAL HISTORY     CANCER SURGERY    There were no vitals filed for this visit.   Subjective Assessment - 04/21/21 0805     Subjective Patient repots doing okay. Reports the vertigo is gone completely. Still going herself some time with transition of sit <> stand. main concern is the leg weakness, feels like it is going to give out on her at times. Reports she is still walking daily, but is cutting these short due to weakness.    Pertinent History Biceps Tendinitis, Rotator Cuff Syndrome, HLD, Migraine, Vitamin D    Limitations  Walking;Standing;House hold activities    Patient Stated Goals resolve the dizziness    Currently in Pain? Yes    Pain Score 2     Pain Location Groin    Pain Orientation Left;Anterior    Pain Descriptors / Indicators Sharp    Pain Type Acute pain    Pain Onset 1 to 4 weeks ago    Pain Frequency Intermittent                     Vestibular Assessment - 04/21/21 0001       Visual Acuity   Static 10    Dynamic 8   mild dizziness     Positional Testing   Dix-Hallpike Dix-Hallpike Right;Dix-Hallpike Left    Horizontal Canal Testing Horizontal Canal Right;Horizontal Canal Left      Dix-Hallpike Right   Dix-Hallpike Right Duration 0    Dix-Hallpike Right Symptoms No nystagmus      Dix-Hallpike Left   Dix-Hallpike Left Duration 0    Dix-Hallpike Left Symptoms Upbeat, left rotatory nystagmus;No nystagmus      Horizontal Canal Right   Horizontal Canal Right Duration 0    Horizontal Canal Right Symptoms Normal      Horizontal Canal  Left   Horizontal Canal Left Duration 0    Horizontal Canal Left Symptoms Normal             \  [facility] Adult PT Treatment/Exercise - 04/21/21 0001       Ambulation/Gait   Ambulation/Gait Yes    Ambulation/Gait Assistance 5: Supervision    Ambulation/Gait Assistance Details amublation into and around therapy gym with [facility], supervision.    Assistive device Straight cane    Gait Pattern Wide base of support    Ambulation Surface Level;Indoor      Standardized Balance Assessment   Standardized Balance Assessment Berg Balance Test      Berg Balance Test   Sit to Stand Able to stand  independently using hands    Standing Unsupported Able to stand safely 2 minutes    Sitting with Back Unsupported but Feet Supported on Floor or Stool Able to sit safely and securely 2 minutes    Stand to Sit Controls descent by using hands    Transfers Able to transfer safely, minor use of hands    Standing Unsupported with Eyes Closed Able to stand  10 seconds with supervision    Standing Ubsupported with Feet Together Able to place feet together independently and stand 1 minute safely    From Standing, Reach Forward with Outstretched Arm Can reach forward >12 cm safely (5")   6"   From Standing Position, Pick up Object from Floor Able to pick up shoe, needs supervision    From Standing Position, Turn to Look Behind Over each Shoulder Looks behind one side only/other side shows less weight shift    Turn 360 Degrees Able to turn 360 degrees safely but slowly    Standing Unsupported, Alternately Place Feet on Step/Stool Able to complete >2 steps/needs minimal assist    Standing Unsupported, One Foot in Front Needs help to step but can hold 15 seconds    Standing on One Leg Tries to lift leg/unable to hold 3 seconds but remains standing independently    Total Score 39             Vestibular Treatment/Exercise - 04/21/21 0001       Vestibular Treatment/Exercise   Vestibular Treatment Provided Gaze    Gaze Exercises X1 Viewing Horizontal;X1 Viewing Vertical      X1 Viewing Horizontal   Foot Position seated    Reps 2    Comments x 30 seconds, mild dizziness      X1 Viewing Vertical   Foot Position seated    Reps 2    Comments x 30 seconds, mild dizziness             Gaze Stabilization: Sitting    Keeping eyes on target on wall 3-4 feet away, tilt head down 15-30 and move head side to side for 30 seconds. Repeat while moving head up and down for 30 seconds. Do 2-3 sessions per day.  Gaze Stabilization: Tip Card  1.Target must remain in focus, not blurry, and appear stationary while head is in motion. 2.Perform exercises with small head movements (45 to either side of midline). 3.Increase speed of head motion so long as target is in focus. 4.If you wear eyeglasses, be sure you can see target through lens (therapist will give specific instructions for bifocal / progressive lenses). 5.These exercises may provoke  dizziness or nausea. Work through these symptoms. If too dizzy, slow head movement slightly. Rest between each exercise. 6.Exercises demand concentration; avoid distractions. 7.For safety,  perform standing exercises close to a counter, wall, corner, or next to someone.  Copyright  VHI. All rights reserved.        PT Education - 04/21/21 0825     Education Details VOR x 1; Merrilee Jansky Balance Results    Person(s) Educated Patient    Methods Explanation;Demonstration;Handout    Comprehension Verbalized understanding;Returned demonstration              PT Short Term Goals - 04/02/21 0836       PT SHORT TERM GOAL #1   Title Patient will undergo further balance assesment (Berg Balance) and LTG to be set as applicable (ALL STGS: 81/82/99)    Baseline TBA    Time 4    Period Weeks    Status New    Target Date 04/30/21      PT SHORT TERM GOAL #2   Title Patient will undergo further vestibular testing (DVA/MSQ) and set LTG as applicable    Baseline TBA    Time 4    Period Weeks    Status New      PT SHORT TERM GOAL #3   Title Patient will demo (-) positional testing to indicate resolution of BPPV    Baseline L BPPV    Time 4    Period Weeks    Status New    Target Date 04/30/21               PT Long Term Goals - 04/02/21 0838       PT LONG TERM GOAL #1   Title Patient will be independent with vestibular/balance HEP (ALL LTGs Due: 05/28/21)    Baseline no HEP established    Time 8    Period Weeks    Status New    Target Date 05/28/21      PT LONG TERM GOAL #2   Title LTG to be set for DVA/MSQ    Baseline TBA    Time 8    Period Weeks    Status New      PT LONG TERM GOAL #3   Title LTG to be set for [employer]    Baseline TBA    Time 8    Period Weeks    Status New      PT LONG TERM GOAL #4   Title Patient will verbalize understanding on fall prevention strategies to promote safety within the home and community    Baseline dependent    Time 8     Period Weeks    Status New      PT LONG TERM GOAL #5   Title Patient will improve DPS >/= 58    Baseline 48    Time 8    Period Weeks    Status New                   Plan - 04/21/21 3716     Clinical Impression Statement Patient demonstrating negative positional testing today indicating resolution of L BPPV. Two line difference on DVA noted with mild dizziness, therefore initiated HEP focused on VOR x 1 with patient tolerating well. Further assessed balance with Merrilee Jansky Balance with patient scoring 39/56 indicating high fall risk. Will continue to progress toward all LTGs.    Personal Factors and Comorbidities Comorbidity 3+    Comorbidities Biceps Tendinitis, Rotator Cuff Syndrome, HLD, Migraine, Vitamin D    Examination-Activity Limitations Bed Mobility;Reach Overhead;Transfers;Stand;Locomotion Level    Examination-Participation Restrictions Community Activity;Laundry;Cleaning  Stability/Clinical Decision Making Stable/Uncomplicated    Rehab Potential Good    PT Frequency 1x / week    PT Duration 8 weeks    PT Treatment/Interventions ADLs/Self Care Home Management;Canalith Repostioning;Cryotherapy;Electrical Stimulation;Moist Heat;DME Instruction;Gait training;Stair training;Functional mobility training;Therapeutic activities;Therapeutic exercise;Balance training;Neuromuscular re-education;Patient/family education;Manual techniques;Dry needling;Passive range of motion;Vestibular    PT Next Visit Plan Review VOR. Add balance and strength to HEP.    Consulted and Agree with Plan of Care Patient             Patient will benefit from skilled therapeutic intervention in order to improve the following deficits and impairments:  Dizziness, Decreased strength, Decreased mobility, Decreased balance, Decreased activity tolerance, Difficulty walking, Abnormal gait  Visit Diagnosis: Dizziness and giddiness  BPPV (benign paroxysmal positional vertigo), left  Unsteadiness on  feet  Muscle weakness (generalized)     Problem List Patient Active Problem List   Diagnosis Date Noted   Benign paroxysmal positional vertigo of left ear 03/23/2021   Daytime somnolence 03/23/2021   History of sleep apnea 03/23/2021   Orthostatic hypotension 03/23/2021   Migraine without aura and without status migrainosus, not intractable 03/23/2021    Jones Bales, PT, DPT 04/21/2021, 8:44 AM  [facility] [address] [place], [place], [address] Phone: [phone]   Fax:  [phone]  Name: Heather Garrett MRN: [id number] Date of Birth: 1950/08/31

## 2021-04-26 ENCOUNTER — Ambulatory Visit: Payer: Medicare PPO | Admitting: Physical Therapy

## 2021-04-27 ENCOUNTER — Other Ambulatory Visit: Payer: Self-pay

## 2021-04-27 ENCOUNTER — Ambulatory Visit (INDEPENDENT_AMBULATORY_CARE_PROVIDER_SITE_OTHER): Payer: Medicare PPO | Admitting: Pulmonary Disease

## 2021-04-27 DIAGNOSIS — R0602 Shortness of breath: Secondary | ICD-10-CM | POA: Diagnosis not present

## 2021-04-27 LAB — PULMONARY FUNCTION TEST
DL/VA % pred: 85 %
DL/VA: 3.72 ml/min/mmHg/L
DLCO cor % pred: 74 %
DLCO cor: 11.69 ml/min/mmHg
DLCO unc % pred: 74 %
DLCO unc: 11.69 ml/min/mmHg
FEF 25-75 Post: 1.62 L/sec
FEF 25-75 Pre: 1.69 L/sec
FEF2575-%Change-Post: -4 %
FEF2575-%Pred-Post: 106 %
FEF2575-%Pred-Pre: 110 %
FEV1-%Change-Post: 4 %
FEV1-%Pred-Post: 107 %
FEV1-%Pred-Pre: 103 %
FEV1-Post: 1.82 L
FEV1-Pre: 1.75 L
FEV1FVC-%Change-Post: 2 %
FEV1FVC-%Pred-Pre: 105 %
FEV6-%Change-Post: 1 %
FEV6-%Pred-Post: 103 %
FEV6-%Pred-Pre: 101 %
FEV6-Post: 2.22 L
FEV6-Pre: 2.18 L
FEV6FVC-%Pred-Post: 105 %
FEV6FVC-%Pred-Pre: 105 %
FVC-%Change-Post: 1 %
FVC-%Pred-Post: 98 %
FVC-%Pred-Pre: 96 %
FVC-Post: 2.22 L
FVC-Pre: 2.18 L
Post FEV1/FVC ratio: 82 %
Post FEV6/FVC ratio: 100 %
Pre FEV1/FVC ratio: 80 %
Pre FEV6/FVC Ratio: 100 %
RV % pred: 96 %
RV: 1.84 L
TLC % pred: 97 %
TLC: 4.05 L

## 2021-04-27 NOTE — Progress Notes (Signed)
Full PFT performed today. °

## 2021-04-27 NOTE — Patient Instructions (Signed)
Full PFT performed today. °

## 2021-05-04 ENCOUNTER — Other Ambulatory Visit: Payer: Self-pay

## 2021-05-04 ENCOUNTER — Ambulatory Visit: Payer: Medicare PPO

## 2021-05-04 DIAGNOSIS — R42 Dizziness and giddiness: Secondary | ICD-10-CM

## 2021-05-04 DIAGNOSIS — M6281 Muscle weakness (generalized): Secondary | ICD-10-CM

## 2021-05-04 DIAGNOSIS — R2681 Unsteadiness on feet: Secondary | ICD-10-CM

## 2021-05-04 NOTE — Patient Instructions (Signed)
Access Code: [email or web address] URL: [email or web address] Date: 05/04/2021 Prepared by: Baldomero Lamy  Exercises Supine Bridge - 1 x daily - 5 x weekly - 2 sets - 10 reps Modified Thomas Stretch - 1 x daily - 5 x weekly - 1 sets - 3 reps - 45-60 seconds hold Supine March - 1 x daily - 5 x weekly - 2 sets - 10 reps Side Stepping with Counter Support - 1 x daily - 5 x weekly - 1 sets - 3-4 reps Romberg Stance with Eyes Closed - 1 x daily - 5 x weekly - 1 sets - 3 reps - 30 seconds hold

## 2021-05-04 NOTE — Therapy (Signed)
[facility] [address] [place], [place], [address] Phone: [phone]   Fax:  [phone]  Physical Therapy Treatment  Patient Details  Name: Heather Garrett MRN: [id number] Date of Birth: [birth year] Referring Provider (PT): Sarina Ill, MD   Encounter Date: 05/04/2021   PT End of Session - 05/04/21 1019     Visit Number 3    Number of Visits 9    Date for PT Re-Evaluation 05/28/21    Authorization Type [employer]    Authorization Time Period Awaiting Authorization    Progress Note Due on Visit 10    PT Start Time 1016    PT Stop Time 1056    PT Time Calculation (min) 40 min    Activity Tolerance Patient tolerated treatment well   mild nausea   Behavior During Therapy WFL for tasks assessed/performed             Past Medical History:  Diagnosis Date   Biceps tendinitis    Cobalamin deficiency    Coronary arteriosclerosis in native artery    Elevated liver function tests    Hyperlipidemia    Migraine    Rotator cuff syndrome    Transitional cell carcinoma, bladder (HCC)    Vitamin D deficiency     Past Surgical History:  Procedure Laterality Date   BREAST SURGERY     CARDIAC CATHETERIZATION     CATARACT EXTRACTION     CHOLECYSTECTOMY  1979   DG  BONE DENSITY ([place] HX)     ENDOMETRIAL ABLATION     GASTRIC BYPASS     MASTECTOMY     OTHER SURGICAL HISTORY     CANCER SURGERY    There were no vitals filed for this visit.   Subjective Assessment - 05/04/21 1019     Subjective Patient reports she hasnt had any dizziness, but feels like there is something in her ear. Balance still feels off. No falls to report. See her PCP next week.    Pertinent History Biceps Tendinitis, Rotator Cuff Syndrome, HLD, Migraine, Vitamin D    Limitations Walking;Standing;House hold activities    Patient Stated Goals resolve the dizziness    Currently in Pain? Yes    Pain Score 5     Pain Location  Groin    Pain Orientation Left;Anterior    Pain Descriptors / Indicators Sharp;Discomfort    Pain Type Acute pain    Pain Onset 1 to 4 weeks ago                [facility] Adult PT Treatment/Exercise - 05/04/21 0001       Transfers   Transfers Sit to Stand;Stand to Sit    Sit to Stand 5: Supervision;With upper extremity assist;From chair/3-in-1    Stand to Sit 5: Supervision;With armrests;With upper extremity assist;To chair/3-in-1      Ambulation/Gait   Ambulation/Gait Yes    Ambulation/Gait Assistance 5: Supervision    Ambulation/Gait Assistance Details ambulation in session and throughout session with SPC, mild unsteadiness.    Ambulation Distance (Feet) --   clinic distance   Assistive device Straight cane    Gait Pattern Wide base of support    Ambulation Surface Level;Indoor      Self-Care   Self-Care Other Self-Care Comments    Other Self-Care Comments  Educated on monitoring swelling in LLE and speak to PCP regarding the edema. PT educating on elevating LLE intermittently throughout day and wearing compression stockings to help with  managment. Patient verbalize understanding.      Neuro Re-ed    Neuro Re-ed Details  Standing at countertop with bil stance, completed lateral weight shift to R/L x 10 reps with reaching activity to shelf. CGA intermittently and cues for improved weight shift onto LLE.             Completed all of the following exercises and added to HEP:  Access Code: [email or web address] URL: [email or web address] Date: 05/04/2021 Prepared by: Baldomero Lamy  Exercises Supine Bridge - 1 x daily - 5 x weekly - 2 sets - 10 reps Modified Thomas Stretch - 1 x daily - 5 x weekly - 1 sets - 3 reps - 45-60 seconds hold Supine March - 1 x daily - 5 x weekly - 2 sets - 10 reps Side Stepping with Counter Support - 1 x daily - 5 x weekly - 1 sets - 3-4 reps - cues to keep feet pointed forwards.  Romberg Stance with Eyes Closed - 1 x daily - 5 x weekly - 1  sets - 3 reps - 30 seconds hold         PT Education - 05/04/21 1058     Education Details HEP Additions    Person(s) Educated Patient    Methods Explanation;Demonstration;Handout    Comprehension Verbalized understanding;Returned demonstration              PT Short Term Goals - 04/02/21 0836       PT SHORT TERM GOAL #1   Title Patient will undergo further balance assesment (Berg Balance) and LTG to be set as applicable (ALL STGS: 38/10/17)    Baseline TBA    Time 4    Period Weeks    Status New    Target Date 04/30/21      PT SHORT TERM GOAL #2   Title Patient will undergo further vestibular testing (DVA/MSQ) and set LTG as applicable    Baseline TBA    Time 4    Period Weeks    Status New      PT SHORT TERM GOAL #3   Title Patient will demo (-) positional testing to indicate resolution of BPPV    Baseline L BPPV    Time 4    Period Weeks    Status New    Target Date 04/30/21               PT Long Term Goals - 04/02/21 0838       PT LONG TERM GOAL #1   Title Patient will be independent with vestibular/balance HEP (ALL LTGs Due: 05/28/21)    Baseline no HEP established    Time 8    Period Weeks    Status New    Target Date 05/28/21      PT LONG TERM GOAL #2   Title LTG to be set for DVA/MSQ    Baseline TBA    Time 8    Period Weeks    Status New      PT LONG TERM GOAL #3   Title LTG to be set for [employer]    Baseline TBA    Time 8    Period Weeks    Status New      PT LONG TERM GOAL #4   Title Patient will verbalize understanding on fall prevention strategies to promote safety within the home and community    Baseline dependent    Time 8    Period Weeks  Status New      PT LONG TERM GOAL #5   Title Patient will improve DPS >/= 58    Baseline 48    Time 8    Period Weeks    Status New                   Plan - 05/04/21 1058     Clinical Impression Statement Today's skilled PT session focused on  strengthening and balance activities and progressing HEP for further improvements outside of PT. Patient tolerating all additions well. Continue to experience mild discomfort in L groin/hip, as well as intermittent swelling in her LLE. Pt to follow up with PCP and PT educating on management of swelling.    Personal Factors and Comorbidities Comorbidity 3+    Comorbidities Biceps Tendinitis, Rotator Cuff Syndrome, HLD, Migraine, Vitamin D    Examination-Activity Limitations Bed Mobility;Reach Overhead;Transfers;Stand;Locomotion Level    Examination-Participation Restrictions Community Activity;Laundry;Cleaning    Stability/Clinical Decision Making Stable/Uncomplicated    Rehab Potential Good    PT Frequency 1x / week    PT Duration 8 weeks    PT Treatment/Interventions ADLs/Self Care Home Management;Canalith Repostioning;Cryotherapy;Electrical Stimulation;Moist Heat;DME Instruction;Gait training;Stair training;Functional mobility training;Therapeutic activities;Therapeutic exercise;Balance training;Neuromuscular re-education;Patient/family education;Manual techniques;Dry needling;Passive range of motion;Vestibular    PT Next Visit Plan How was HEP additions? NuStep. Bending Activities with Reach. balance on Foam. Gait    Consulted and Agree with Plan of Care Patient             Patient will benefit from skilled therapeutic intervention in order to improve the following deficits and impairments:  Dizziness, Decreased strength, Decreased mobility, Decreased balance, Decreased activity tolerance, Difficulty walking, Abnormal gait  Visit Diagnosis: Dizziness and giddiness  Unsteadiness on feet  Muscle weakness (generalized)     Problem List Patient Active Problem List   Diagnosis Date Noted   Benign paroxysmal positional vertigo of left ear 03/23/2021   Daytime somnolence 03/23/2021   History of sleep apnea 03/23/2021   Orthostatic hypotension 03/23/2021   Migraine without aura and  without status migrainosus, not intractable 03/23/2021    Jones Bales, PT, DPT 05/04/2021, 11:08 AM  [facility] [address] [place], [place], [address] Phone: [phone]   Fax:  [phone]  Name: Heather Garrett MRN: [id number] Date of Birth: [birth year]

## 2021-05-10 ENCOUNTER — Ambulatory Visit: Payer: Medicare PPO

## 2021-05-10 ENCOUNTER — Other Ambulatory Visit: Payer: Self-pay

## 2021-05-10 DIAGNOSIS — M6281 Muscle weakness (generalized): Secondary | ICD-10-CM

## 2021-05-10 DIAGNOSIS — R42 Dizziness and giddiness: Secondary | ICD-10-CM | POA: Diagnosis not present

## 2021-05-10 DIAGNOSIS — R2681 Unsteadiness on feet: Secondary | ICD-10-CM

## 2021-05-10 NOTE — Therapy (Signed)
[facility] [address] [place], [place], [address] Phone: [phone]   Fax:  [phone]  Physical Therapy Treatment  Patient Details  Name: Heather Garrett MRN: [id number] Date of Birth: [birth year] Referring Provider (PT): Sarina Ill, MD   Encounter Date: 05/10/2021   PT End of Session - 05/10/21 0933     Visit Number 4    Number of Visits 9    Date for PT Re-Evaluation 05/28/21    Authorization Type [employer]    Authorization Time Period Awaiting Authorization    Progress Note Due on Visit 10    PT Start Time 0933    PT Stop Time 1015    PT Time Calculation (min) 42 min    Activity Tolerance Patient tolerated treatment well   mild nausea   Behavior During Therapy WFL for tasks assessed/performed             Past Medical History:  Diagnosis Date   Biceps tendinitis    Cobalamin deficiency    Coronary arteriosclerosis in native artery    Elevated liver function tests    Hyperlipidemia    Migraine    Rotator cuff syndrome    Transitional cell carcinoma, bladder ([facility])    Vitamin D deficiency     Past Surgical History:  Procedure Laterality Date   BREAST SURGERY     CARDIAC CATHETERIZATION     CATARACT EXTRACTION     CHOLECYSTECTOMY  1979   DG  BONE DENSITY ([place] HX)     ENDOMETRIAL ABLATION     GASTRIC BYPASS     MASTECTOMY     OTHER SURGICAL HISTORY     CANCER SURGERY    There were no vitals filed for this visit.   Subjective Assessment - 05/10/21 0935     Subjective Patient reports that the next day after last session, she was extremely sore. Reports she has been doing the exercises, just taking her time. No falls. Reports the exercises do make the hip feel better, short term relief.    Pertinent History Biceps Tendinitis, Rotator Cuff Syndrome, HLD, Migraine, Vitamin D    Limitations Walking;Standing;House hold activities    Patient Stated Goals resolve the dizziness     Currently in Pain? Yes    Pain Score 6     Pain Location Groin    Pain Orientation Left;Anterior    Pain Descriptors / Indicators Sharp;Discomfort    Pain Type Acute pain    Pain Onset 1 to 4 weeks ago                     Vestibular Assessment - 05/10/21 0001       Positional Testing   Dix-Hallpike Dix-Hallpike Right;Dix-Hallpike Left    Horizontal Canal Testing Horizontal Canal Right;Horizontal Canal Left      Dix-Hallpike Right   Dix-Hallpike Right Duration 0    Dix-Hallpike Right Symptoms No nystagmus      Dix-Hallpike Left   Dix-Hallpike Left Duration 0    Dix-Hallpike Left Symptoms No nystagmus      Horizontal Canal Right   Horizontal Canal Right Duration 0    Horizontal Canal Right Symptoms Normal      Horizontal Canal Left   Horizontal Canal Left Duration 0    Horizontal Canal Left Symptoms Normal                      [facility] Adult PT Treatment/Exercise - 05/10/21  0001       Transfers   Transfers Sit to Stand;Stand to Sit    Sit to Stand 5: Supervision;With upper extremity assist;From chair/3-in-1    Stand to Sit 5: Supervision;With armrests;With upper extremity assist;To chair/3-in-1      Ambulation/Gait   Ambulation/Gait Yes    Ambulation/Gait Assistance 5: Supervision    Ambulation/Gait Assistance Details ambulation throughout session    Assistive device Rollator    Gait Pattern Wide base of support    Ambulation Surface Level;Indoor      Neuro Re-ed    Neuro Re-ed Details  Seated in chair cone pick ups x 4, with focus on habituation for vestibular. Pt instructed to move compensatory saccade and then move head slowly to avoid dizziness/symptoms. Cone picks ups in standing 2x4 to further challenge standing balance. Pt instructed to bend knees to avoid pain in hip w/ flexion. Min guard w/ gait belt. Colored target pick ups 2x2 to further challenge due to low surface on ground. Min guard w/ gait belt.      Exercises   Exercises  Knee/Hip      Knee/Hip Exercises: Aerobic   Nustep Completed with BUE/BLE on Level 1 x 5 minutes for focused on improved activity tolerance and strengthening. Patient tolerating NuStep well.                 Balance Exercises - 05/10/21 0001       Balance Exercises: Standing   Standing Eyes Opened Wide (BOA);Foam/compliant surface;Head turns   Pt performed standing balance in // with wide base of support 1x30" without head turns with verbal cues to maintain weight towards heels. Pt performed standing balance with feet shoudler width apart w/ head turns horiz/vert x10.   Standing Eyes Closed Wide (BOA);Foam/compliant surface   Pt performed standing balance feet shoulder width apart in // on foam for 2x30". Pt performed standing balnce with feet closer together in // on foam x30".               PT Education - 05/10/21 1020     Education Details appropriate rest breaks with HEP at home;    Person(s) Educated Patient    Methods Explanation    Comprehension Verbalized understanding              PT Short Term Goals - 05/10/21 0939       PT SHORT TERM GOAL #1   Title Patient will undergo further balance assesment (Berg Balance) and LTG to be set as applicable (ALL STGS: 40/81/44)    Baseline TBA; Assessed    Time 4    Period Weeks    Status Achieved    Target Date 04/30/21      PT SHORT TERM GOAL #2   Title Patient will undergo further vestibular testing (DVA/MSQ) and set LTG as applicable    Baseline TBA; Assesesd    Time 4    Period Weeks    Status Achieved      PT SHORT TERM GOAL #3   Title Patient will demo (-) positional testing to indicate resolution of BPPV    Baseline L BPPV; negative positional testing    Time 4    Period Weeks    Status Achieved    Target Date 04/30/21               PT Long Term Goals - 05/10/21 1030       PT LONG TERM GOAL #1   Title Patient will  be independent with vestibular/balance HEP (ALL LTGs Due: 05/28/21)     Baseline no HEP established    Time 8    Period Weeks    Status New      PT LONG TERM GOAL #2   Title Patient will demo <2 line difference on DVA to indicate improved VOR    Baseline TBA; 2 line difference    Time 8    Period Weeks    Status Revised      PT LONG TERM GOAL #3   Title Patient will improve Berg Balance to >/= 45/56 to demo reduced fall risk    Baseline TBA; 39/56    Time 8    Period Weeks    Status New      PT LONG TERM GOAL #4   Title Patient will verbalize understanding on fall prevention strategies to promote safety within the home and community    Baseline dependent    Time 8    Period Weeks    Status New      PT LONG TERM GOAL #5   Title Patient will improve DPS >/= 58    Baseline 48    Time 8    Period Weeks    Status New                   Plan - 05/10/21 1027     Clinical Impression Statement Assessed patient's progress toward STG. Patient able to meet all STG at this time. Negative positional testing indicating continued resolution of L BPPV. Rest of session focused on balance and habituation training with patient tolerating well, most challenge noted with vision removed due to reduced vestibular input to balance. Initiated endurance training on NuStep, no pain reported and tolerating well. Will continue to progress toward all LTGs.    Personal Factors and Comorbidities Comorbidity 3+    Comorbidities Biceps Tendinitis, Rotator Cuff Syndrome, HLD, Migraine, Vitamin D    Examination-Activity Limitations Bed Mobility;Reach Overhead;Transfers;Stand;Locomotion Level    Examination-Participation Restrictions Community Activity;Laundry;Cleaning    Stability/Clinical Decision Making Stable/Uncomplicated    Rehab Potential Good    PT Frequency 1x / week    PT Duration 8 weeks    PT Treatment/Interventions ADLs/Self Care Home Management;Canalith Repostioning;Cryotherapy;Electrical Stimulation;Moist Heat;DME Instruction;Gait training;Stair  training;Functional mobility training;Therapeutic activities;Therapeutic exercise;Balance training;Neuromuscular re-education;Patient/family education;Manual techniques;Dry needling;Passive range of motion;Vestibular    PT Next Visit Plan Continue NuStep.Add in Reaching On Foam. Balance on Foam. Gait with SPC. Potential Sock Aid?    Consulted and Agree with Plan of Care Patient             Patient will benefit from skilled therapeutic intervention in order to improve the following deficits and impairments:  Dizziness, Decreased strength, Decreased mobility, Decreased balance, Decreased activity tolerance, Difficulty walking, Abnormal gait  Visit Diagnosis: Dizziness and giddiness  Unsteadiness on feet  Muscle weakness (generalized)     Problem List Patient Active Problem List   Diagnosis Date Noted   Benign paroxysmal positional vertigo of left ear 03/23/2021   Daytime somnolence 03/23/2021   History of sleep apnea 03/23/2021   Orthostatic hypotension 03/23/2021   Migraine without aura and without status migrainosus, not intractable 03/23/2021    Jones Bales, PT, DPT 05/10/2021, 10:31 AM  [facility] [address] [place], [place], [address] Phone: [phone]   Fax:  [phone]  Name: Heather Garrett MRN: [id number] Date of Birth: [birth year]

## 2021-05-12 ENCOUNTER — Other Ambulatory Visit: Payer: Self-pay | Admitting: Family Medicine

## 2021-05-12 DIAGNOSIS — R600 Localized edema: Secondary | ICD-10-CM

## 2021-05-13 ENCOUNTER — Telehealth: Payer: Self-pay | Admitting: Gastroenterology

## 2021-05-13 ENCOUNTER — Ambulatory Visit
Admission: RE | Admit: 2021-05-13 | Discharge: 2021-05-13 | Disposition: A | Discharge status: Home / Self Care | Payer: Medicare PPO | Source: Ambulatory Visit | Attending: Family Medicine | Admitting: Family Medicine

## 2021-05-13 ENCOUNTER — Telehealth: Payer: Self-pay | Admitting: Cardiology

## 2021-05-13 DIAGNOSIS — R600 Localized edema: Secondary | ICD-10-CM

## 2021-05-13 IMAGING — US US EXTREM LOW VENOUS
1 series · 13 of 24 positions shown · non-contrast
Comparison: None.

CLINICAL DATA: Bilateral lower extremity edema.



[Series 1: us extrem low venous · 0.06mm/px · 13 of 55 slices shown]
[im 1/55]
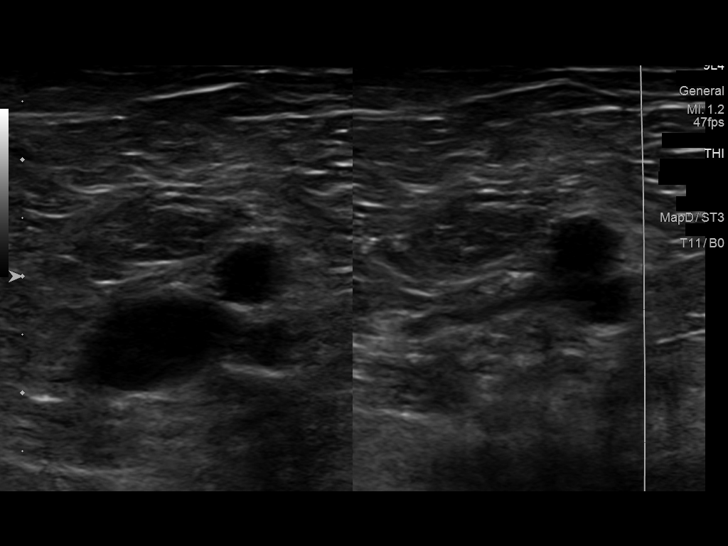
[im 5/55]
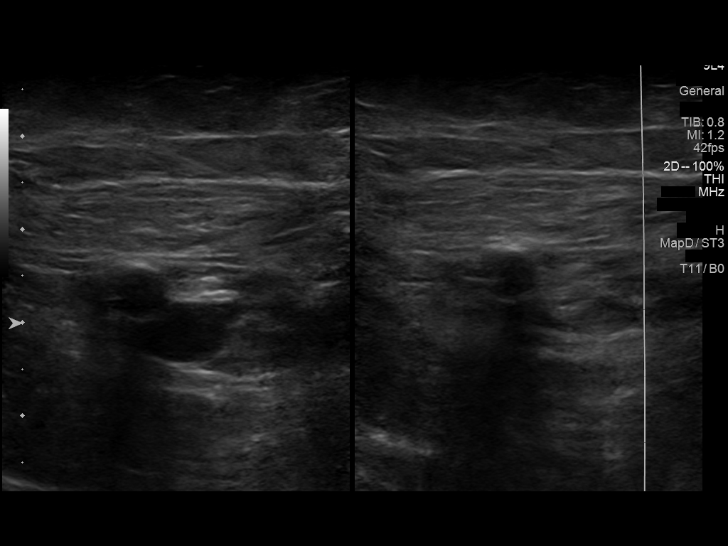
[im 10/55]
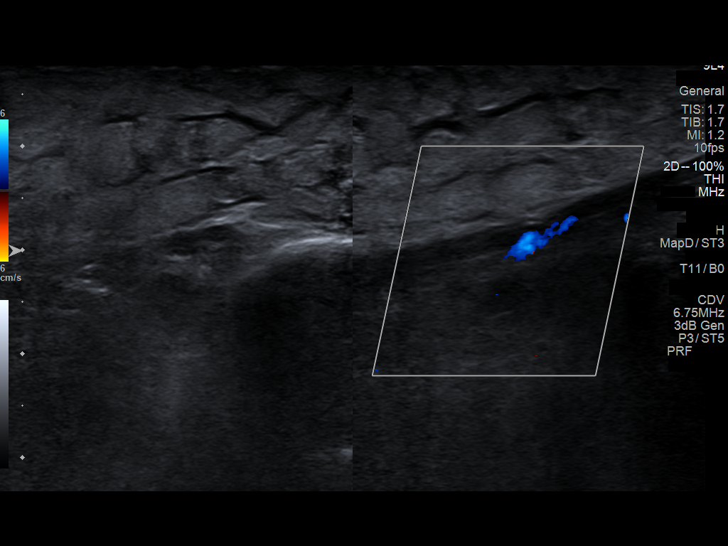
[im 15/55]
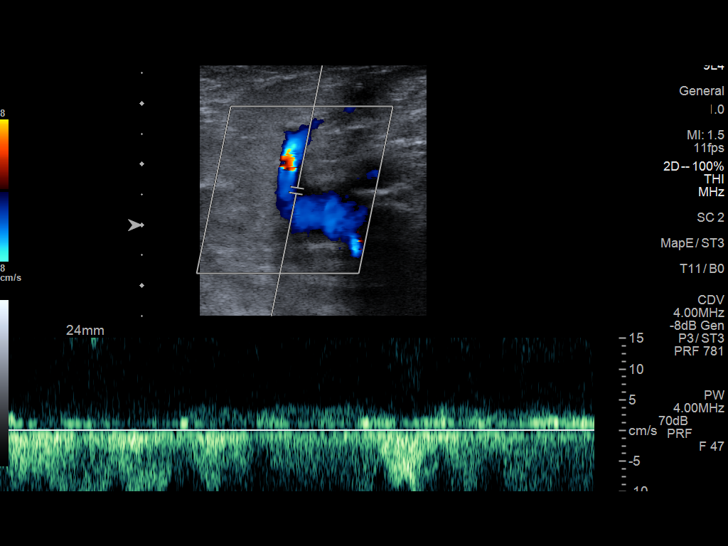
[im 19/55]
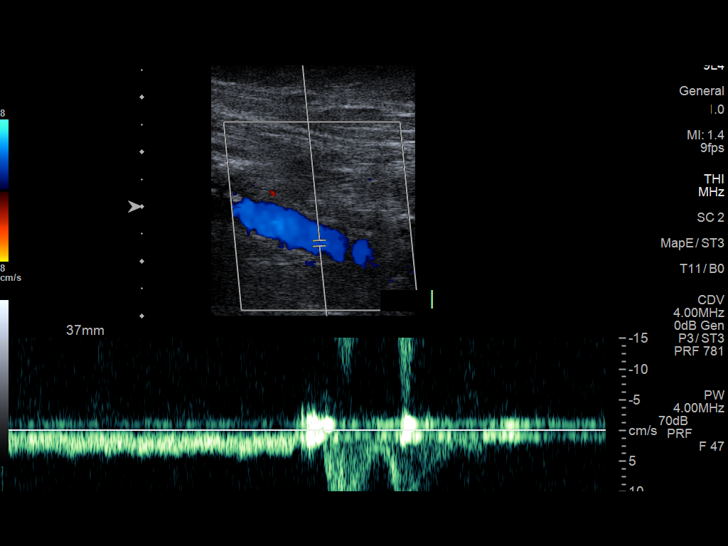
[im 24/55]
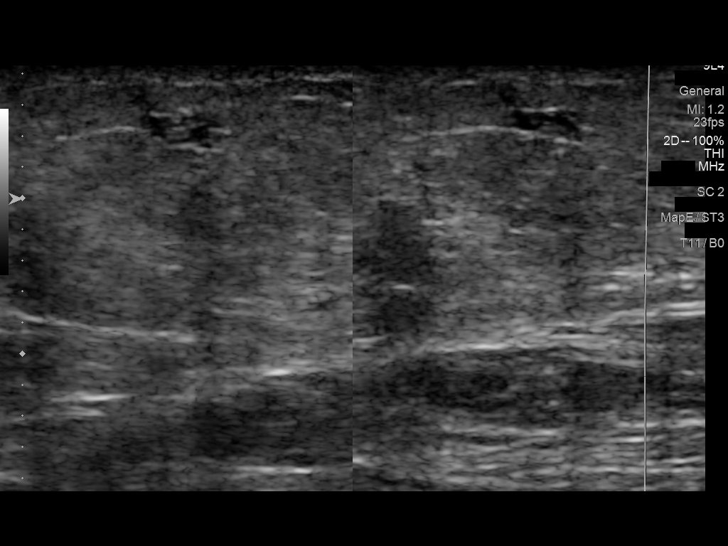
[im 29/55]
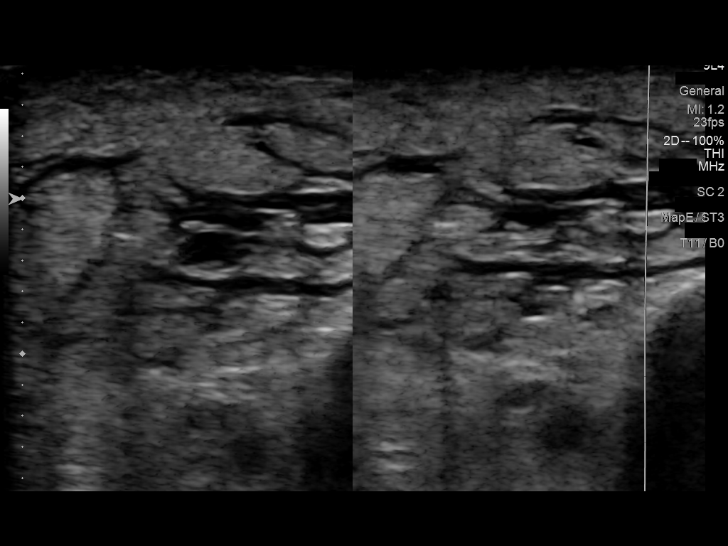
[im 31/55]
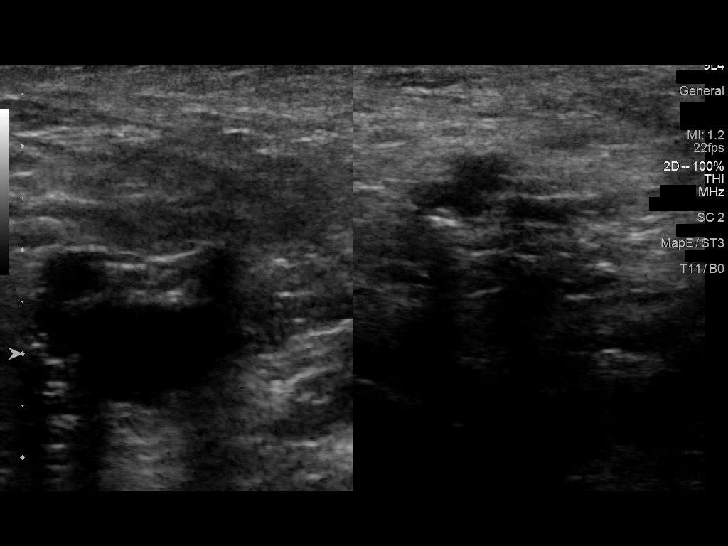
[im 36/55]
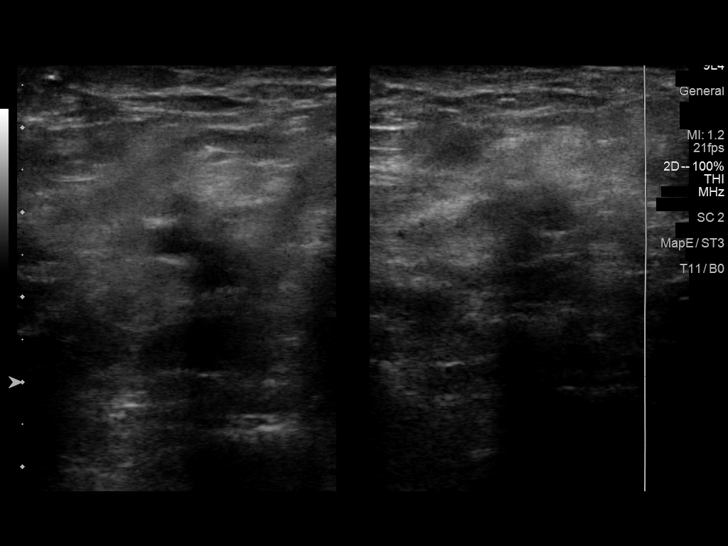
[im 40/55]
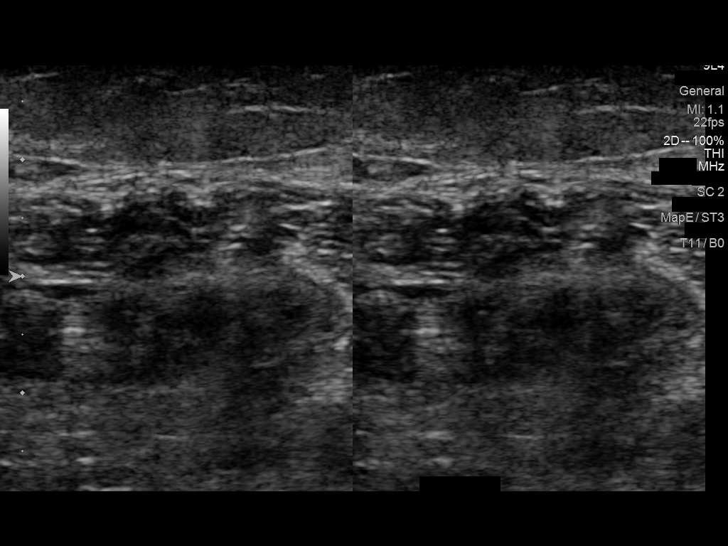
[im 45/55]
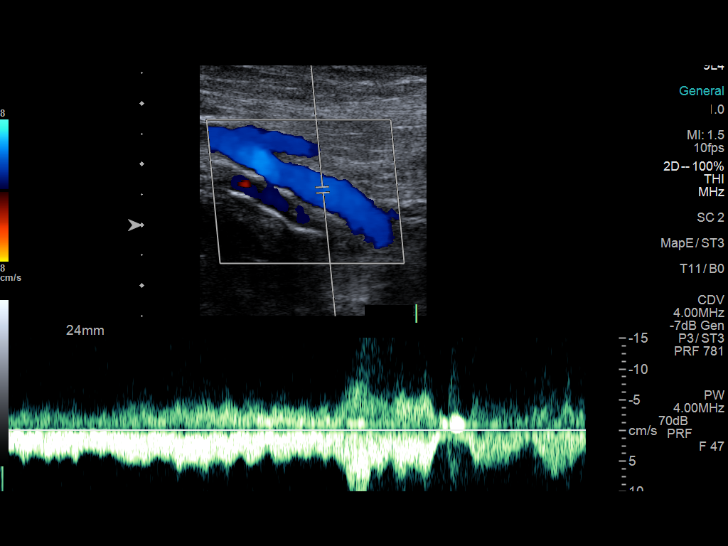
[im 50/55]
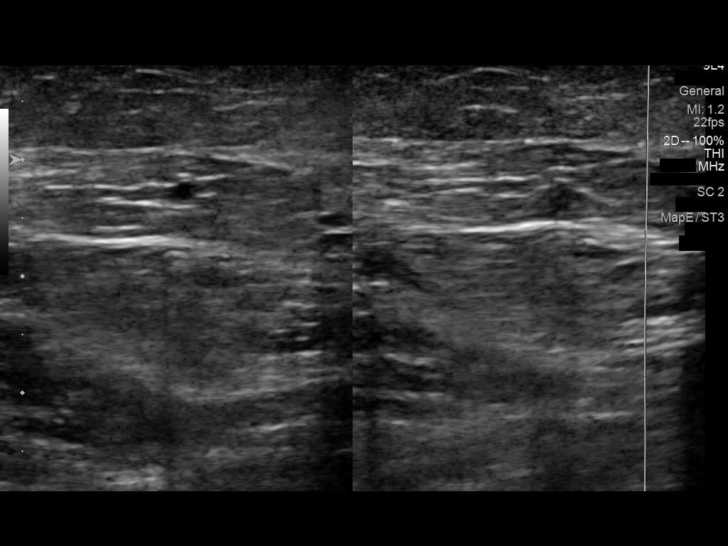
[im 55/55]
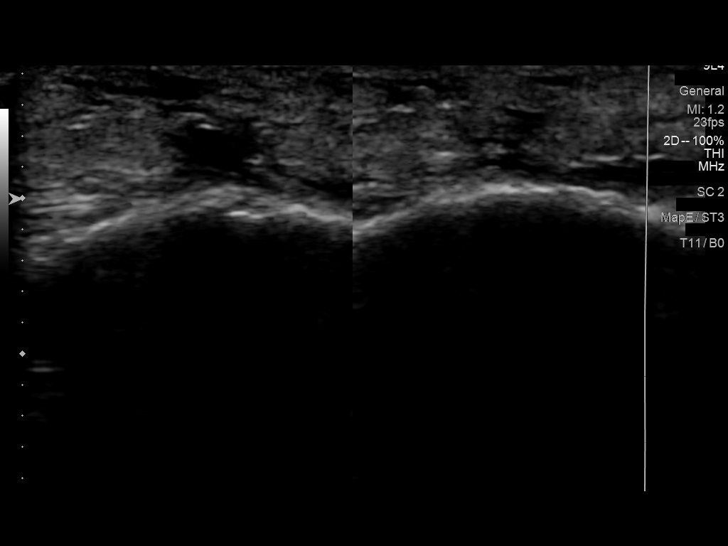

[13 of 24 positions shown; findings below may reference images not displayed]

FINDINGS: RIGHT LOWER EXTREMITY

Common Femoral Vein: No evidence of thrombus. Normal
compressibility, respiratory phasicity and response to augmentation.

Saphenofemoral Junction: No evidence of thrombus. Normal
compressibility and flow on color Doppler imaging.

Profunda Femoral Vein: No evidence of thrombus. Normal
compressibility and flow on color Doppler imaging.

Femoral Vein: No evidence of thrombus. Normal compressibility,
respiratory phasicity and response to augmentation.

Popliteal Vein: No evidence of thrombus. Normal compressibility,
respiratory phasicity and response to augmentation.

Calf Veins: Posterior tibial and peroneal veins are not visualized
due to edema.

Superficial Great Saphenous Vein: No evidence of thrombus. Normal
compressibility.

Venous Reflux:  None.

Other Findings:  None.

LEFT LOWER EXTREMITY

Common Femoral Vein: No evidence of thrombus. Normal
compressibility, respiratory phasicity and response to augmentation.

Saphenofemoral Junction: No evidence of thrombus. Normal
compressibility and flow on color Doppler imaging.

Profunda Femoral Vein: No evidence of thrombus. Normal
compressibility and flow on color Doppler imaging.

Femoral Vein: No evidence of thrombus. Normal compressibility,
respiratory phasicity and response to augmentation.

Popliteal Vein: No evidence of thrombus. Normal compressibility,
respiratory phasicity and response to augmentation.

Calf Veins: Posterior tibial veins are unremarkable. Peroneal veins
are not visualized.

Superficial Great Saphenous Vein: Noncompressible suggesting
superficial thrombophlebitis.

Venous Reflux:  None.

Other Findings:  None.
IMPRESSION: No evidence of deep venous thrombosis in either lower extremity.
However, probable superficial thrombophlebitis involving left
superficial greater saphenous vein.

## 2021-05-13 NOTE — Telephone Encounter (Signed)
Returned call to [facility] at Dr. Ival Bible office. She states the patient needs an appointment ASAP for elevated bnp >800 on 11/2. Patient is no longer in the office. I asked about appointment that is scheduled with [place] for tomorrow, 11/4. She states [place] is going to cancel the appointment. I advised that I will call patient.  Spoke with patient who states she would like to be seen by Dr. Jacolyn Reedy office. I scheduled her with Dr. Marlou Porch, DOD on 11/4. Patient denies SOB at present. She thanked me for the call.

## 2021-05-13 NOTE — Telephone Encounter (Signed)
FYI, labs are supposed to be coming on this pt. See note below.

## 2021-05-13 NOTE — Telephone Encounter (Signed)
Patients PCP called to advise she has elevated LFT's sending recent labs not sure if patients needs to be seen prior to procedure scheduled on 05/27/21.

## 2021-05-13 NOTE — Telephone Encounter (Signed)
[name] is calling from Dr. Ival Bible office stating they are faxing over an urgent referral to the main fax number [phone], for the patient to be worked in for an appt. She reports Heather Garrett has an Elevated BMP. Was unable to find anything at any office in [place] before December. Requesting callback to have patient worked in on DOD day. Request you ask for Ashli when calling.

## 2021-05-14 ENCOUNTER — Ambulatory Visit: Payer: Medicare PPO | Admitting: Cardiology

## 2021-05-14 ENCOUNTER — Other Ambulatory Visit: Payer: Self-pay

## 2021-05-14 ENCOUNTER — Encounter: Payer: Self-pay | Admitting: Cardiology

## 2021-05-14 VITALS — BP 120/80 | HR 69 | Ht <= 58 in | Wt 106.0 lb

## 2021-05-14 DIAGNOSIS — R600 Localized edema: Secondary | ICD-10-CM | POA: Diagnosis not present

## 2021-05-14 NOTE — Patient Instructions (Signed)
Medication Instructions:  The current medical regimen is effective;  continue present plan and medications.  *If you need a refill on your cardiac medications before your next appointment, please call your pharmacy*  You have been referred to Dr Orlie Pollen at [facility].  Follow-Up: At [facility], you and your health needs are our priority.  As part of our continuing mission to provide you with exceptional heart care, we have created designated Provider Care Teams.  These Care Teams include your primary Cardiologist (physician) and Advanced Practice Providers (APPs -  Physician Assistants and Nurse Practitioners) who all work together to provide you with the care you need, when you need it.  We recommend signing up for the patient portal called "MyChart".  Sign up information is provided on this After Visit Summary.  MyChart is used to connect with patients for Virtual Visits (Telemedicine).  Patients are able to view lab/test results, encounter notes, upcoming appointments, etc.  Non-urgent messages can be sent to your provider as well.   To learn more about what you can do with MyChart, go to [email or web address].    Your next appointment:   Follow up as scheduled  Thank you for choosing [facility]!!

## 2021-05-14 NOTE — Progress Notes (Signed)
Cardiology Office Note:    Date:  05/14/2021   ID:  Heather Garrett, DOB 02-10-1951, MRN [id number]  PCP:  [name], MD   [facility] Providers Cardiologist:  [name], MD Cardiology APP:  [name]     Referring MD: [name], MD   History of Present Illness:    Heather Garrett is a 70 y.o. female here for the evaluation of elevated BNP of 800 (05/12/2021) at the request of Dr. Ernie Hew.  Today: She endorses bilateral LE edema (L>R) with initial onset 2 weeks ago. Previously she was taking Lasix and wearing compression hose. It was very painful for her to put the compression hose on or take them off. Her PCP discontinued Lasix and instructed her not to wear compression.   Once a week she participates in physical therapy, and regularly performs her given at-home exercises.  She denies any palpitations, chest pain, or shortness of breath. No lightheadedness, headaches, syncope, orthopnea, or PND.  Past Medical History:  Diagnosis Date   Biceps tendinitis    Cobalamin deficiency    Coronary arteriosclerosis in native artery    Elevated liver function tests    Hyperlipidemia    Migraine    Rotator cuff syndrome    Transitional cell carcinoma, bladder (HCC)    Vitamin D deficiency     Past Surgical History:  Procedure Laterality Date   BREAST SURGERY     CARDIAC CATHETERIZATION     CATARACT EXTRACTION     CHOLECYSTECTOMY  1979   DG  BONE DENSITY ([place] HX)     ENDOMETRIAL ABLATION     GASTRIC BYPASS     MASTECTOMY     OTHER SURGICAL HISTORY     CANCER SURGERY    Current Medications: Current Meds  Medication Sig   acetaminophen (TYLENOL) 500 MG tablet Take 1,000 mg by mouth every 6 (six) hours as needed for mild pain.   aspirin 81 MG EC tablet Take 81 mg by mouth every other day. Swallow whole.   B Complex Vitamins (VITAMIN B COMPLEX PO) Take by mouth.   calcium carbonate (TUMS - DOSED IN MG ELEMENTAL CALCIUM) 500 MG chewable  tablet Chew 1-2 tablets by mouth daily as needed for indigestion or heartburn.   carvedilol (COREG) 3.125 MG tablet Take 3.125 mg by mouth 2 (two) times daily.   ezetimibe-simvastatin (VYTORIN) 10-80 MG tablet Take 1 tablet by mouth daily.   hydrochlorothiazide (HYDRODIURIL) 25 MG tablet Take 25 mg by mouth daily.   lisinopril (ZESTRIL) 5 MG tablet Take 1 tablet (5 mg total) by mouth daily.   omeprazole (PRILOSEC) 20 MG capsule Take 20 mg by mouth 2 (two) times daily before a meal.   PEG-KCl-NaCl-NaSulf-Na Asc-C (PLENVU) 140 g SOLR Use as directed.   Polyethylene Glycol 400 (BLINK TEARS) 0.25 % SOLN Apply 1 drop to eye daily as needed (dry eyes).   Probiotic Product (PROBIOTIC PO) Take 1 tablet by mouth daily.   sertraline (ZOLOFT) 100 MG tablet Take 100 mg by mouth daily.   tiZANidine (ZANAFLEX) 4 MG capsule Take 4 mg by mouth at bedtime.   topiramate (TOPAMAX) 50 MG tablet Take 50 mg by mouth at bedtime.   Vitamin D, Ergocalciferol, (DRISDOL) 1.25 MG (50000 UNIT) CAPS capsule Take 50,000 Units by mouth once a week. Sunday     Allergies:   Contrast media [iodinated diagnostic agents], Ciprofloxacin, Erythromycin base, and Sulfabenzamide   Social History   Socioeconomic History  Marital status: Married    Spouse name: Not on file   Number of children: Not on file   Years of education: Not on file   Highest education level: Not on file  Occupational History   Not on file  Tobacco Use   Smoking status: Former    Passive exposure: Never   Smokeless tobacco: Never   Tobacco comments:    Quit 30 yrs ago  Vaping Use   Vaping Use: Never used  Substance and Sexual Activity   Alcohol use: Not Currently    Alcohol/week: 14.0 standard drinks    Types: 14 Glasses of wine per week   Drug use: Never   Sexual activity: Not on file  Other Topics Concern   Not on file  Social History Narrative   Not on file   Social Determinants of Health   Financial Resource Strain: Not on file   Food Insecurity: Not on file  Transportation Needs: Not on file  Physical Activity: Not on file  Stress: Not on file  Social Connections: Not on file     Family History: The patient's family history is negative for Migraines.  ROS:   Please see the history of present illness.    (+) Bilateral LE edema (L>R) (+) Bruises easily All other systems reviewed and are negative.  EKGs/Labs/Other Studies Reviewed:    The following studies were reviewed today:  LE Venous Doppler 05/13/2021: COMPARISON:  None.   FINDINGS: RIGHT LOWER EXTREMITY   Common Femoral Vein: No evidence of thrombus. Normal compressibility, respiratory phasicity and response to augmentation.   Saphenofemoral Junction: No evidence of thrombus. Normal compressibility and flow on color Doppler imaging.   Profunda Femoral Vein: No evidence of thrombus. Normal compressibility and flow on color Doppler imaging.   Femoral Vein: No evidence of thrombus. Normal compressibility, respiratory phasicity and response to augmentation.   Popliteal Vein: No evidence of thrombus. Normal compressibility, respiratory phasicity and response to augmentation.   Calf Veins: Posterior tibial and peroneal veins are not visualized due to edema.   Superficial Great Saphenous Vein: No evidence of thrombus. Normal compressibility.   Venous Reflux:  None.   Other Findings:  None.   LEFT LOWER EXTREMITY   Common Femoral Vein: No evidence of thrombus. Normal compressibility, respiratory phasicity and response to augmentation.   Saphenofemoral Junction: No evidence of thrombus. Normal compressibility and flow on color Doppler imaging.   Profunda Femoral Vein: No evidence of thrombus. Normal compressibility and flow on color Doppler imaging.   Femoral Vein: No evidence of thrombus. Normal compressibility, respiratory phasicity and response to augmentation.   Popliteal Vein: No evidence of thrombus. Normal  compressibility, respiratory phasicity and response to augmentation.   Calf Veins: Posterior tibial veins are unremarkable. Peroneal veins are not visualized.   Superficial Great Saphenous Vein: Noncompressible suggesting superficial thrombophlebitis.   Venous Reflux:  None.   Other Findings:  None.   IMPRESSION: No evidence of deep venous thrombosis in either lower extremity. However, probable superficial thrombophlebitis involving left superficial greater saphenous vein.  Mesenteric Vascular US 02/22/2021: Summary:  Largest Aortic Diameter: Aortoiliac atherosclerosis with largest  measurements at  the mid segment, measuring 1.8 cm.     Mesenteric:  Normal Celiac artery , Superior Mesenteric artery, Inferior Mesenteric  artery,  Splenic artery and Hepatic artery findings.   CT Chest 01/02/2021 FINDINGS: Cardiovascular: Extensive multi-vessel coronary artery calcification. Global cardiac size within normal limits. No pericardial effusion. The central pulmonary arteries are mildly enlarged in keeping  with changes of pulmonary arterial hypertension. Moderate atherosclerotic calcification within the thoracic aorta. No aortic aneurysm.   Mediastinum/Nodes: The visualized thyroid is unremarkable. No pathologic thoracic adenopathy. 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 The esophagus is unremarkable.   Lungs/Pleura: A 4 mm ground-glass pulmonary nodule is seen within the superior segment of the left lower lobe, axial image # 42/5. 3 mm nodule within the a medial right lung base, axial image # 96, demonstrates a benign calcification pattern and is compatible with a tiny granuloma. Mild subpleural reticulation is seen within the right lower lobe with parenchymal banding most in keeping with mild fibrotic change. No other focal pulmonary nodules or infiltrates. No pneumothorax or pleural  effusion. The central airways are widely patent.   Upper Abdomen: At least moderate hepatic steatosis. Cholecystectomy has been performed. Partial resection of the upper pole the right kidney noted. No acute abnormality.   Musculoskeletal: No lytic or blastic bone lesion. No acute bone abnormality.   IMPRESSION: Extensive multi-vessel coronary artery calcification.   Pulmonary arterial enlargement in keeping with pulmonary artery hypertension.   4 mm ground-glass pulmonary nodule within the left lower lobe. No follow-up recommended. This recommendation follows the consensus statement: Guidelines for Management of Incidental Pulmonary Nodules Detected on CT Images: From the Fleischner Society 2017; Radiology 2017; 284:228-243.   Moderate hepatic steatosis.   Aortic Atherosclerosis (ICD10-I70.0).  LE Venous DVT 11/19/2020: Summary:  RIGHT:  - No evidence of common femoral vein obstruction.     LEFT:  - There is no evidence of deep vein thrombosis in the lower extremity.  Echo 09/24/2020: Impression: 1. Suboptimal study due to poor acoustic windows. 2. LV wall thickness is normal with normal left ventricular contractility and an overall ejection fraction of 50-55%. 3. Grade 1 diastolic dysfunction. E/e' measured 8.35. 4. Moderate left atrial enlargement. 5. Normal right atrial measurements. 6. Aortic valve is sclerotic but not stenotic. 7. Mitral valve is normal in mobility and thickness. Mild MAC with trace MR. 8. Tricuspid valve is well visualized and normal. Mild TR with a PA pressure of 33 mmHg.  EKG:  EKG is personally reviewed and interpreted. 05/14/2021: EKG was not ordered today.   Recent Labs: 11/25/2020: NT-Pro BNP 1,702 01/26/2021: ALT 28; BUN 13; Creatinine, Ser 0.74; Hemoglobin 11.9; Platelets 203; Potassium 4.7; Sodium 138   Recent Lipid Panel No results found for: CHOL, TRIG, HDL, CHOLHDL, VLDL, LDLCALC, LDLDIRECT   Risk Assessment/Calculations:           Physical Exam:    VS:  BP 120/80 (BP Location: Left Arm, Patient Position: Sitting, Cuff Size: Normal)   Pulse 69   Ht _0  (1.422 m)   Wt 106 lb (48.1 kg)   SpO2 98%   BMI 23.76 kg/m     Wt Readings from Last 3 Encounters:  05/14/21 106 lb (48.1 kg)  03/23/21 96 lb 9.6 oz (43.8 kg)  03/08/21 101 lb (45.8 kg)     GEN: Well nourished, well developed in no acute distress HEENT: Normal NECK: No JVD; No carotid bruits LYMPHATICS: No lymphadenopathy CARDIAC: RRR, no murmurs, rubs, gallops RESPIRATORY:  Clear to auscultation without rales, wheezing or rhonchi  ABDOMEN: Soft, non-tender, non-distended MUSCULOSKELETAL:  4+ bilateral LE edema L>R; No deformity  SKIN: Warm and dry NEUROLOGIC:  Alert and oriented x 3 PSYCHIATRIC:  Normal affect   ASSESSMENT:    1. Leg edema, left   2. Lower extremity  edema    PLAN:    In order of problems listed above:  Lower extremity edema Left greater than right.  4+.  Just developed over the last 2 weeks.  No evidence of DVT on vascular ultrasound however possible superficial thrombophlebitis noted.  She states that 1 month ago, her legs were "normal ".  I will refer her over to vascular given the severity of her edema for further suggestions.  She tells me that Dr. Ernie Hew recently stopped her furosemide.  She is now on HCTZ.  She also told her to stop wearing compression hose.  Maybe it was because her skin is thin and easily tears.  I recommend elevation of her legs.  I am curious to see vascular's opinion in the situation.  Her echocardiogram personally reviewed shows normal overall pump function.  Even though her BNP is elevated, her pump function appears normal.  Theoretically, I would usually use furosemide in the situation given the gross volume of fluid in her legs but I am unsure the reason this was stopped previously.  Perhaps her kidney function was recently worsened.       Follow-up: With Dr. Johney Frame as  sched.  Medication Adjustments/Labs and Tests Ordered: Current medicines are reviewed at length with the patient today.  Concerns regarding medicines are outlined above.  Orders Placed This Encounter  Procedures   Ambulatory referral to Vascular Surgery     No orders of the defined types were placed in this encounter.   Patient Instructions  Medication Instructions:  The current medical regimen is effective;  continue present plan and medications.  *If you need a refill on your cardiac medications before your next appointment, please call your pharmacy*  You have been referred to Dr Orlie Pollen at [facility].  Follow-Up: At [facility], you and your health needs are our priority.  As part of our continuing mission to provide you with exceptional heart care, we have created designated Provider Care Teams.  These Care Teams include your primary Cardiologist (physician) and Advanced Practice Providers (APPs -  Physician Assistants and Nurse Practitioners) who all work together to provide you with the care you need, when you need it.  We recommend signing up for the patient portal called "MyChart".  Sign up information is provided on this After Visit Summary.  MyChart is used to connect with patients for Virtual Visits (Telemedicine).  Patients are able to view lab/test results, encounter notes, upcoming appointments, etc.  Non-urgent messages can be sent to your provider as well.   To learn more about what you can do with MyChart, go to [email or web address].    Your next appointment:   Follow up as scheduled  Thank you for choosing [facility]!!     I,Mathew Stumpf,acting as a scribe for [name], MD.,have documented all relevant documentation on the behalf of [name], MD,as directed by  [name], MD while in the presence of [name], MD.  I, [name], MD, have reviewed all documentation for this visit. The documentation on 05/14/21  for the exam, diagnosis, procedures, and orders are all accurate and complete.   Signed, [name], MD  05/14/2021 12:36 PM    [place]

## 2021-05-14 NOTE — Assessment & Plan Note (Signed)
Left greater than right.  4+.  Just developed over the last 2 weeks.  No evidence of DVT on vascular ultrasound however possible superficial thrombophlebitis noted.  She states that 1 month ago, her legs were "normal ".  I will refer her over to vascular given the severity of her edema for further suggestions.  She tells me that Dr. Ernie Hew recently stopped her furosemide.  She is now on HCTZ.  She also told her to stop wearing compression hose.  Maybe it was because her skin is thin and easily tears.  I recommend elevation of her legs.  I am curious to see vascular's opinion in the situation.  Her echocardiogram personally reviewed shows normal overall pump function.  Even though her BNP is elevated, her pump function appears normal.  Theoretically, I would usually use furosemide in the situation given the gross volume of fluid in her legs but I am unsure the reason this was stopped previously.  Perhaps her kidney function was recently worsened.

## 2021-05-17 ENCOUNTER — Ambulatory Visit: Payer: Medicare PPO

## 2021-05-17 ENCOUNTER — Other Ambulatory Visit: Payer: Self-pay

## 2021-05-17 DIAGNOSIS — I519 Heart disease, unspecified: Secondary | ICD-10-CM | POA: Insufficient documentation

## 2021-05-17 DIAGNOSIS — M6530 Trigger finger, unspecified finger: Secondary | ICD-10-CM | POA: Insufficient documentation

## 2021-05-17 DIAGNOSIS — I10 Essential (primary) hypertension: Secondary | ICD-10-CM | POA: Insufficient documentation

## 2021-05-17 DIAGNOSIS — C50919 Malignant neoplasm of unspecified site of unspecified female breast: Secondary | ICD-10-CM | POA: Insufficient documentation

## 2021-05-17 DIAGNOSIS — C679 Malignant neoplasm of bladder, unspecified: Secondary | ICD-10-CM | POA: Insufficient documentation

## 2021-05-17 MED ORDER — CARVEDILOL 3.125 MG PO TABS: 3.1250 mg | ORAL_TABLET | Freq: Two times a day (BID) | ORAL | 3 refills | Status: DC

## 2021-05-17 NOTE — Telephone Encounter (Signed)
Pt's medication was sent to pt's pharmacy as requested. Confirmation received.  °

## 2021-05-18 ENCOUNTER — Other Ambulatory Visit: Payer: Self-pay

## 2021-05-18 ENCOUNTER — Ambulatory Visit: Payer: Medicare PPO | Admitting: Physician Assistant

## 2021-05-18 VITALS — BP 177/95 | HR 63 | Temp 97.6°F | Resp 20 | Ht <= 58 in | Wt 104.2 lb

## 2021-05-18 DIAGNOSIS — I8002 Phlebitis and thrombophlebitis of superficial vessels of left lower extremity: Secondary | ICD-10-CM | POA: Diagnosis not present

## 2021-05-18 DIAGNOSIS — R609 Edema, unspecified: Secondary | ICD-10-CM

## 2021-05-18 NOTE — Progress Notes (Signed)
Office Note     CC:  follow up Requesting Provider:  [name], MD  HPI: Heather Garrett is a 70 y.o. (12-28-1950) female who presents for evaluation of left lower extremity edema.  She was seen by her cardiologist last week who ordered a venous duplex.  Imaging was negative for DVT however did demonstrate findings consistent with superficial thrombophlebitis involving the left greater saphenous vein.  Patient does not describe any painful areas along the path of the greater saphenous vein.  She denies any history of DVT.  She was told to discontinue her compression stockings.  She also recently switched from Lasix to hydrochlorothiazide.  She denies history of venous ulcerations, trauma, or prior vascular interventions of left lower extremity.  She has family history of venous stasis in her mother.  She denies tobacco use.   Past Medical History:  Diagnosis Date   Biceps tendinitis    Cobalamin deficiency    Coronary arteriosclerosis in native artery    Elevated liver function tests    Hyperlipidemia    Migraine    Rotator cuff syndrome    Transitional cell carcinoma, bladder (HCC)    Vitamin D deficiency     Past Surgical History:  Procedure Laterality Date   BREAST SURGERY     CARDIAC CATHETERIZATION     CATARACT EXTRACTION     CHOLECYSTECTOMY  1979   DG  BONE DENSITY ([facility] HX)     ENDOMETRIAL ABLATION     GASTRIC BYPASS     MASTECTOMY     OTHER SURGICAL HISTORY     CANCER SURGERY    Social History   Socioeconomic History   Marital status: Married    Spouse name: Not on file   Number of children: Not on file   Years of education: Not on file   Highest education level: Not on file  Occupational History   Not on file  Tobacco Use   Smoking status: Former    Passive exposure: Never   Smokeless tobacco: Never   Tobacco comments:    Quit 30 yrs ago  Vaping Use   Vaping Use: Never used  Substance and Sexual Activity   Alcohol use: Not Currently     Alcohol/week: 14.0 standard drinks    Types: 14 Glasses of wine per week   Drug use: Never   Sexual activity: Not on file  Other Topics Concern   Not on file  Social History Narrative   Not on file   Social Determinants of Health   Financial Resource Strain: Not on file  Food Insecurity: Not on file  Transportation Needs: Not on file  Physical Activity: Not on file  Stress: Not on file  Social Connections: Not on file  Intimate Partner Violence: Not on file    Family History  Problem Relation Age of Onset   Migraines Neg Hx     Current Outpatient Medications  Medication Sig Dispense Refill   acetaminophen (TYLENOL) 500 MG tablet Take 1,000 mg by mouth every 6 (six) hours as needed for mild pain.     aspirin 81 MG EC tablet Take 81 mg by mouth every other day. Swallow whole.     B Complex Vitamins (VITAMIN B COMPLEX PO) Take by mouth.     calcium carbonate (TUMS - DOSED IN MG ELEMENTAL CALCIUM) 500 MG chewable tablet Chew 1-2 tablets by mouth daily as needed for indigestion or heartburn.     carvedilol (COREG) 3.125 MG tablet Take 1  tablet (3.125 mg total) by mouth 2 (two) times daily. 180 tablet 3   ezetimibe-simvastatin (VYTORIN) 10-80 MG tablet Take 1 tablet by mouth daily.     hydrochlorothiazide (HYDRODIURIL) 25 MG tablet Take 25 mg by mouth daily.     omeprazole (PRILOSEC) 20 MG capsule Take 20 mg by mouth 2 (two) times daily before a meal.     Polyethylene Glycol 400 (BLINK TEARS) 0.25 % SOLN Apply 1 drop to eye daily as needed (dry eyes).     Probiotic Product (PROBIOTIC PO) Take 1 tablet by mouth daily.     sertraline (ZOLOFT) 100 MG tablet Take 100 mg by mouth daily.     tiZANidine (ZANAFLEX) 4 MG capsule Take 4 mg by mouth at bedtime.     topiramate (TOPAMAX) 50 MG tablet Take 50 mg by mouth at bedtime.     Vitamin D, Ergocalciferol, (DRISDOL) 1.25 MG (50000 UNIT) CAPS capsule Take 50,000 Units by mouth once a week. Sunday     lisinopril (ZESTRIL) 5 MG tablet Take 1  tablet (5 mg total) by mouth daily. 90 tablet 3   No current facility-administered medications for this visit.    Allergies  Allergen Reactions   Contrast Media [Iodinated Diagnostic Agents] Anaphylaxis and Hives   Ciprofloxacin Other (See Comments)    Interaction w other medicine   Erythromycin Base Nausea And Vomiting   Sulfabenzamide Other (See Comments)    UNK reaction     REVIEW OF SYSTEMS:   [X]  denotes positive finding, [ ]  denotes negative finding Cardiac  Comments:  Chest pain or chest pressure:    Shortness of breath upon exertion:    Short of breath when lying flat:    Irregular heart rhythm:        Vascular    Pain in calf, thigh, or hip brought on by ambulation:    Pain in feet at night that wakes you up from your sleep:     Blood clot in your veins:    Leg swelling:         Pulmonary    Oxygen at home:    Productive cough:     Wheezing:         Neurologic    Sudden weakness in arms or legs:     Sudden numbness in arms or legs:     Sudden onset of difficulty speaking or slurred speech:    Temporary loss of vision in one eye:     Problems with dizziness:         Gastrointestinal    Blood in stool:     Vomited blood:         Genitourinary    Burning when urinating:     Blood in urine:        Psychiatric    Major depression:         Hematologic    Bleeding problems:    Problems with blood clotting too easily:        Skin    Rashes or ulcers:        Constitutional    Fever or chills:      PHYSICAL EXAMINATION:  Vitals:   05/18/21 1049  BP: (!) 177/95  Pulse: 63  Resp: 20  Temp: 97.6 F (36.4 C)  TempSrc: Temporal  SpO2: 100%  Weight: 104 lb 3.2 oz (47.3 kg)  Height: 4\' 8"  (1.422 m)    General:  WDWN in NAD; vital signs documented above Gait: Not observed  HENT: WNL, normocephalic Pulmonary: normal non-labored breathing Cardiac: regular HR, Abdomen: soft, NT, no masses Skin: without rashes Vascular Exam/Pulses:  Right Left   Radial 2+ (normal) 2+ (normal)  DP 2+ (normal) 2+ (normal)   Extremities: without ischemic changes, without Gangrene , without cellulitis; without open wounds; 2+ edema left lower extremity from the mid shin; no venous ulcerations; no palpable cord or pain along the path of the left greater saphenous vein Musculoskeletal: no muscle wasting or atrophy  Neurologic: A&O X 3;  No focal weakness or paresthesias are detected Psychiatric:  The pt has Normal affect.   Non-Invasive Vascular Imaging:   Imaging reviewed from 11/4 demonstrating left greater saphenous vein thrombophlebitis.  Study was also negative for DVT    ASSESSMENT/PLAN:: 70 y.o. female here for evaluation of left lower extremity edema  -Imaging study prior to today's visit demonstrated superficial phlebitis of left greater saphenous vein.  Study was also negative for DVT.  Symptoms are unilateral suggesting further evidence of venous insufficiency.  Recommendations include knee-high 15 to 20 mmHg compression stockings to be worn regularly, daily.  We also discussed proper elevation of her legs above the level of her heart.  She should also use NSAIDs when venous symptoms are at their peak.  I also offered patient a return visit for a left lower extremity venous reflux study to further evaluate superficial phlebitis as well as venous disease however this likely will not change our treatment plan at this time.  Patient does not wish to proceed with left lower extremity venous reflux study at this time.  She will call the office back if she changes her mind.  For now she will proceed with conservative management outlined above.  She will call/return office if symptoms worsen or if she develops wounds.  For now she will follow-up as needed.   Dagoberto Ligas, PA-C [facility] [phone]  Clinic MD:   Carlis Abbott

## 2021-05-25 ENCOUNTER — Ambulatory Visit: Payer: Medicare PPO | Attending: Neurology

## 2021-05-25 ENCOUNTER — Other Ambulatory Visit: Payer: Self-pay

## 2021-05-25 DIAGNOSIS — R2681 Unsteadiness on feet: Secondary | ICD-10-CM | POA: Insufficient documentation

## 2021-05-25 DIAGNOSIS — R42 Dizziness and giddiness: Secondary | ICD-10-CM | POA: Diagnosis present

## 2021-05-25 DIAGNOSIS — M6281 Muscle weakness (generalized): Secondary | ICD-10-CM | POA: Insufficient documentation

## 2021-05-25 NOTE — Patient Instructions (Signed)
Access Code: [email or web address] URL: [email or web address] Date: 05/25/2021 Prepared by: Baldomero Lamy  Patient Education [place] [facility] Checklist

## 2021-05-25 NOTE — Therapy (Signed)
[facility] [address] [place], [place], [address] Phone: [phone]   Fax:  [phone]  Physical Therapy Treatment/Discharge Summary  Patient Details  Name: Heather Garrett MRN: [id number] Date of Birth: 04-01-1951 Referring Provider (PT): Sarina Ill, MD  PHYSICAL THERAPY DISCHARGE SUMMARY  Visits from Start of Care: 5  Current functional level related to goals / functional outcomes: See Clinical Impression Statement   Remaining deficits: Mild Imbalance   Education / Equipment: HEP/Fall Prevention Checklist   Patient agrees to discharge. Patient goals were met. Patient is being discharged due to meeting the stated rehab goals.  Encounter Date: 05/25/2021   PT End of Session - 05/25/21 0932     Visit Number 5    Number of Visits 9    Date for PT Re-Evaluation 05/28/21    Authorization Type [employer]    Authorization Time Period Awaiting Authorization    Progress Note Due on Visit 10    PT Start Time 0932    PT Stop Time 1005    PT Time Calculation (min) 33 min    Equipment Utilized During Treatment Gait belt    Activity Tolerance Patient tolerated treatment well   mild nausea   Behavior During Therapy WFL for tasks assessed/performed             Past Medical History:  Diagnosis Date   Biceps tendinitis    Cobalamin deficiency    Coronary arteriosclerosis in native artery    Elevated liver function tests    Hyperlipidemia    Migraine    Rotator cuff syndrome    Transitional cell carcinoma, bladder (HCC)    Vitamin D deficiency     Past Surgical History:  Procedure Laterality Date   BREAST SURGERY     CARDIAC CATHETERIZATION     CATARACT EXTRACTION     CHOLECYSTECTOMY  1979   DG  BONE DENSITY ([place] HX)     ENDOMETRIAL ABLATION     GASTRIC BYPASS     MASTECTOMY     OTHER SURGICAL HISTORY     CANCER SURGERY    There were no vitals filed for this visit.   Subjective  Assessment - 05/25/21 0935     Subjective Patient reports that she has not been feeling well. So having some GI issues and have been fatigued. Reports she has been on an antibiotic, this has helped that groin pain and the swelling in the leg.    Pertinent History Biceps Tendinitis, Rotator Cuff Syndrome, HLD, Migraine, Vitamin D    Limitations Walking;Standing;House hold activities    Patient Stated Goals resolve the dizziness    Currently in Pain? No/denies    Pain Onset 1 to 4 weeks ago                [facility] PT Assessment - 05/25/21 0001       Assessment   Medical Diagnosis BPPV    Referring Provider (PT) Sarina Ill, MD      Observation/Other Assessments   Focus on Therapeutic Outcomes (FOTO)  DPS: 64, DFS: 60.6      Standardized Balance Assessment   Standardized Balance Assessment Berg Balance Test      Berg Balance Test   Sit to Stand Able to stand without using hands and stabilize independently    Standing Unsupported Able to stand safely 2 minutes    Sitting with Back Unsupported but Feet Supported on Floor or Stool Able to sit safely and securely  2 minutes    Stand to Sit Sits safely with minimal use of hands    Transfers Able to transfer safely, minor use of hands    Standing Unsupported with Eyes Closed Able to stand 10 seconds safely    Standing Unsupported with Feet Together Able to place feet together independently and stand 1 minute safely    From Standing, Reach Forward with Outstretched Arm Can reach forward >12 cm safely (5")    From Standing Position, Pick up Object from Floor Able to pick up shoe safely and easily    From Standing Position, Turn to Look Behind Over each Shoulder Looks behind one side only/other side shows less weight shift    Turn 360 Degrees Able to turn 360 degrees safely but slowly    Standing Unsupported, Alternately Place Feet on Step/Stool Able to stand independently and complete 8 steps >20 seconds    Standing Unsupported, One Foot  in Front Able to take small step independently and hold 30 seconds    Standing on One Leg Tries to lift leg/unable to hold 3 seconds but remains standing independently    Total Score 46    Berg comment: 46/56              Vestibular Assessment - 05/25/21 0001       Visual Acuity   Static 10    Dynamic 9   no dizziness              [facility] Adult PT Treatment/Exercise - 05/25/21 0001       Therapeutic Activites    Therapeutic Activities Other Therapeutic Activities    Other Therapeutic Activities PT educating on fall prevention and reviewed fall prevention checklist with patient to promote improved safety within the home.            Access Code: [email or web address] URL: [email or web address] Date: 05/25/2021 Prepared by: Baldomero Lamy  Patient Education Falls at Home Checklist   Access Code: [email or web address] URL: [email or web address] Date: 05/04/2021 Prepared by: Baldomero Lamy   Exercises Supine Bridge - 1 x daily - 5 x weekly - 2 sets - 10 reps Modified Thomas Stretch - 1 x daily - 5 x weekly - 1 sets - 3 reps - 45-60 seconds hold Supine March - 1 x daily - 5 x weekly - 2 sets - 10 reps Side Stepping with Counter Support - 1 x daily - 5 x weekly - 1 sets - 3-4 reps Romberg Stance with Eyes Closed - 1 x daily - 5 x weekly - 1 sets - 3 reps - 30 seconds hold      PT Education - 05/25/21 1005     Education Details progress toward LTGs; fall prevention    Person(s) Educated Patient    Methods Explanation;Handout    Comprehension Verbalized understanding              PT Short Term Goals - 05/10/21 0939       PT SHORT TERM GOAL #1   Title Patient will undergo further balance assesment (Berg Balance) and LTG to be set as applicable (ALL STGS: 67/61/95)    Baseline TBA; Assessed    Time 4    Period Weeks    Status Achieved    Target Date 04/30/21      PT SHORT TERM GOAL #2   Title Patient will undergo further vestibular testing  (DVA/MSQ) and set LTG as applicable    Baseline TBA; Assesesd    Time 4  Period Weeks    Status Achieved      PT SHORT TERM GOAL #3   Title Patient will demo (-) positional testing to indicate resolution of BPPV    Baseline L BPPV; negative positional testing    Time 4    Period Weeks    Status Achieved    Target Date 04/30/21               PT Long Term Goals - 05/25/21 0938       PT LONG TERM GOAL #1   Title Patient will be independent with vestibular/balance HEP (ALL LTGs Due: 05/28/21)    Baseline no HEP established; reports completing 2x/daily and reports independence    Time 8    Period Weeks    Status Achieved      PT LONG TERM GOAL #2   Title Patient will demo <2 line difference on DVA to indicate improved VOR    Baseline TBA; 2 line difference; 1 line difference on DVA    Time 8    Period Weeks    Status Achieved      PT LONG TERM GOAL #3   Title Patient will improve Berg Balance to >/= 45/56 to demo reduced fall risk    Baseline TBA; 39/56; 46/56    Time 8    Period Weeks    Status Achieved      PT LONG TERM GOAL #4   Title Patient will verbalize understanding on fall prevention strategies to promote safety within the home and community    Baseline dependent; reports understanding    Time 8    Period Weeks    Status Achieved      PT LONG TERM GOAL #5   Title Patient will improve DPS >/= 58    Baseline 48; 64%    Time 8    Period Weeks    Status Achieved                   Plan - 05/25/21 1007     Clinical Impression Statement Today's skilled PT session included assesment of patient's progress toward all LTG. Patient able to meet all goals today demonstrating reduced dizziness, improved balance, and reduced fall risk. Patient scored 46/56 on [employer] demo decreased fall risk. Patient improved DVA to 1 line difference indicating improved VOR. Patient has made significant progress with PT services and demo readiness to d/c from PT  services. Patient agreeable to d/c    Personal Factors and Comorbidities Comorbidity 3+    Comorbidities Biceps Tendinitis, Rotator Cuff Syndrome, HLD, Migraine, Vitamin D    Examination-Activity Limitations Bed Mobility;Reach Overhead;Transfers;Stand;Locomotion Level    Examination-Participation Restrictions Community Activity;Laundry;Cleaning    Stability/Clinical Decision Making Stable/Uncomplicated    Rehab Potential Good    PT Frequency 1x / week    PT Duration 8 weeks    PT Treatment/Interventions ADLs/Self Care Home Management;Canalith Repostioning;Cryotherapy;Electrical Stimulation;Moist Heat;DME Instruction;Gait training;Stair training;Functional mobility training;Therapeutic activities;Therapeutic exercise;Balance training;Neuromuscular re-education;Patient/family education;Manual techniques;Dry needling;Passive range of motion;Vestibular    PT Next Visit Plan d/c this visit    Consulted and Agree with Plan of Care Patient             Patient will benefit from skilled therapeutic intervention in order to improve the following deficits and impairments:  Dizziness, Decreased strength, Decreased mobility, Decreased balance, Decreased activity tolerance, Difficulty walking, Abnormal gait  Visit Diagnosis: Dizziness and giddiness  Unsteadiness on feet  Muscle weakness (generalized)  Problem List Patient Active Problem List   Diagnosis Date Noted   Triggering of finger 05/17/2021   Heart disease 05/17/2021   Malignant tumor of breast ([place]) 05/17/2021   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[place]) 05/17/2021   Hypertensive disorder 05/17/2021   Lower extremity edema 05/14/2021   Benign paroxysmal positional vertigo of left ear 03/23/2021   Daytime somnolence 03/23/2021   History of sleep apnea 03/23/2021   Orthostatic hypotension 03/23/2021   Migraine without aura and without status migrainosus, not intractable 03/23/2021   Coronary arteriosclerosis in native artery  09/28/2016   Elevated LFTs 09/28/2016   Rotator cuff syndrome 09/28/2016   Biceps tendinitis 09/28/2016   Cobalamin deficiency 09/28/2016   Hyperlipidemia 09/28/2016   Migraine 09/28/2016   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[place]) 09/28/2016   Vitamin D deficiency 09/28/2016    Jones Bales, PT, DPT 05/25/2021, 10:09 AM  [facility] [address] [place], [place], [address] Phone: [phone]   Fax:  [phone]  Name: STARLEE CORRALEJO MRN: [id number] Date of Birth: 01/11/1951

## 2021-05-27 ENCOUNTER — Encounter: Payer: Self-pay | Admitting: Gastroenterology

## 2021-05-27 ENCOUNTER — Ambulatory Visit (AMBULATORY_SURGERY_CENTER): Payer: Medicare PPO | Admitting: Gastroenterology

## 2021-05-27 ENCOUNTER — Other Ambulatory Visit: Payer: Self-pay

## 2021-05-27 VITALS — BP 187/79 | HR 67 | Temp 97.6°F | Resp 12 | Ht <= 58 in | Wt 101.0 lb

## 2021-05-27 DIAGNOSIS — R1013 Epigastric pain: Secondary | ICD-10-CM | POA: Diagnosis not present

## 2021-05-27 DIAGNOSIS — D374 Neoplasm of uncertain behavior of colon: Secondary | ICD-10-CM | POA: Diagnosis not present

## 2021-05-27 DIAGNOSIS — Z9884 Bariatric surgery status: Secondary | ICD-10-CM | POA: Diagnosis not present

## 2021-05-27 DIAGNOSIS — D120 Benign neoplasm of cecum: Secondary | ICD-10-CM

## 2021-05-27 DIAGNOSIS — Z86010 Personal history of colonic polyps: Secondary | ICD-10-CM | POA: Diagnosis not present

## 2021-05-27 DIAGNOSIS — D125 Benign neoplasm of sigmoid colon: Secondary | ICD-10-CM

## 2021-05-27 DIAGNOSIS — R634 Abnormal weight loss: Secondary | ICD-10-CM

## 2021-05-27 DIAGNOSIS — D122 Benign neoplasm of ascending colon: Secondary | ICD-10-CM

## 2021-05-27 DIAGNOSIS — D123 Benign neoplasm of transverse colon: Secondary | ICD-10-CM

## 2021-05-27 MED ORDER — SODIUM CHLORIDE 0.9 % IV SOLN: 500.0000 mL | Freq: Once | INTRAVENOUS | Status: DC

## 2021-05-27 NOTE — Progress Notes (Signed)
Pt stable during procedure  Transported to PACU

## 2021-05-27 NOTE — Patient Instructions (Signed)
Please read handouts provided. Continue present medications. Clip Card. Await pathology results. Repeat colonoscopy in 1 year for screening. Follow-up with Dr. [name] to discuss chronic GI symptoms.   YOU HAD AN ENDOSCOPIC PROCEDURE TODAY AT [facility]:   Refer to the procedure report that was given to you for any specific questions about what was found during the examination.  If the procedure report does not answer your questions, please call your gastroenterologist to clarify.  If you requested that your care partner not be given the details of your procedure findings, then the procedure report has been included in a sealed envelope for you to review at your convenience later.  YOU SHOULD EXPECT: Some feelings of bloating in the abdomen. Passage of more gas than usual.  Walking can help get rid of the air that was put into your GI tract during the procedure and reduce the bloating. If you had a lower endoscopy (such as a colonoscopy or flexible sigmoidoscopy) you may notice spotting of blood in your stool or on the toilet paper. If you underwent a bowel prep for your procedure, you may not have a normal bowel movement for a few days.  Please Note:  You might notice some irritation and congestion in your nose or some drainage.  This is from the oxygen used during your procedure.  There is no need for concern and it should clear up in a day or so.  SYMPTOMS TO REPORT IMMEDIATELY:  Following lower endoscopy (colonoscopy or flexible sigmoidoscopy):  Excessive amounts of blood in the stool  Significant tenderness or worsening of abdominal pains  Swelling of the abdomen that is new, acute  Fever of 100F or higher  Following upper endoscopy (EGD)  Vomiting of blood or coffee ground material  New chest pain or pain under the shoulder blades  Painful or persistently difficult swallowing  New shortness of breath  Fever of 100F or higher  Black, tarry-looking stools  For  urgent or emergent issues, a gastroenterologist can be reached at any hour by calling [phone]. Do not use MyChart messaging for urgent concerns.    DIET:  We do recommend a small meal at first, but then you may proceed to your regular diet.  Drink plenty of fluids but you should avoid alcoholic beverages for 24 hours.  ACTIVITY:  You should plan to take it easy for the rest of today and you should NOT DRIVE or use heavy machinery until tomorrow (because of the sedation medicines used during the test).    FOLLOW UP: Our staff will call the number listed on your records 48-72 hours following your procedure to check on you and address any questions or concerns that you may have regarding the information given to you following your procedure. If we do not reach you, we will leave a message.  We will attempt to reach you two times.  During this call, we will ask if you have developed any symptoms of COVID 19. If you develop any symptoms (ie: fever, flu-like symptoms, shortness of breath, cough etc.) before then, please call [phone].  If you test positive for Covid 19 in the 2 weeks post procedure, please call and report this information to [place].    If any biopsies were taken you will be contacted by phone or by letter within the next 1-3 weeks.  Please call us at [phone] if you have not heard about the biopsies in 3 weeks.    SIGNATURES/CONFIDENTIALITY: You and/or  your care partner have signed paperwork which will be entered into your electronic medical record.  These signatures attest to the fact that that the information above on your After Visit Summary has been reviewed and is understood.  Full responsibility of the confidentiality of this discharge information lies with you and/or your care-partner.

## 2021-05-27 NOTE — Op Note (Signed)
[facility] Patient Name: Pascha Fogal Procedure Date: 05/27/2021 7:34 AM MRN: [id number] Endoscopist: Nicki Reaper E. [name] , MD Age: 70 Referring MD:  Date of Birth: 08/10/1950 Gender: Female Account #: [id number] Procedure:                Upper GI endoscopy Indications:              Epigastric abdominal pain Medicines:                Monitored Anesthesia Care Procedure:                Pre-Anesthesia Assessment:                           - Prior to the procedure, a History and Physical                            was performed, and patient medications and                            allergies were reviewed. The patient's tolerance of                            previous anesthesia was also reviewed. The risks                            and benefits of the procedure and the sedation                            options and risks were discussed with the patient.                            All questions were answered, and informed consent                            was obtained. Prior Anticoagulants: The patient has                            taken no previous anticoagulant or antiplatelet                            agents except for aspirin. ASA Grade Assessment: II                            - A patient with mild systemic disease. After                            reviewing the risks and benefits, the patient was                            deemed in satisfactory condition to undergo the                            procedure.  After obtaining informed consent, the endoscope was                            passed under direct vision. Throughout the                            procedure, the patient's blood pressure, pulse, and                            oxygen saturations were monitored continuously. The                            GIF [id number] #[id number] was introduced through the                            mouth, and advanced to the efferent jejunal loop.                             The upper GI endoscopy was accomplished without                            difficulty. The patient tolerated the procedure                            well. Scope In: Scope Out: Findings:                 The examined portions of the nasopharynx,                            oropharynx and larynx were normal.                           The examined esophagus was normal.                           Evidence of a gastric bypass was found. A gastric                            pouch with a 9 cm length from the GE junction to                            the gastrojejunal anastomosis was found containing                            pill debris. The gastrojejunal anastomosis was                            characterized by erythema, friable mucosa and an                            intact staple line.                           Normal mucosa was found in the stomach. Biopsies  were taken with a cold forceps for Helicobacter                            pylori testing. Estimated blood loss was minimal.                           A 3 cm hiatal hernia was present.                           The examined jejunum was normal. Complications:            No immediate complications. Estimated Blood Loss:     Estimated blood loss was minimal. Impression:               - The examined portions of the nasopharynx,                            oropharynx and larynx were normal.                           - Normal esophagus.                           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 Gastrojejunal anastomosis                            characterized by erythema, friable mucosa and an                            intact staple line. No stenosis or stricture at the                            anastomosis. No ulcer.                           - Normal mucosa was found in the stomach. Biopsied.                           - 3 cm  hiatal hernia. Unclear if this may be                            playing a role in the patient's postprandial                            symptoms.                           - Normal examined jejunum. Recommendation:           - Patient has a contact number available for                            emergencies. The signs and symptoms of potential  delayed complications were discussed with the                            patient. Return to normal activities tomorrow.                            Written discharge instructions were provided to the                            patient.                           - Resume previous diet.                           - Continue present medications.                           - Await pathology results.                           - Follow up in clinic with Dr. [name] to                            discuss further management of chronic GI symptoms. Arty Lantzy E. [name], MD 05/27/2021 9:18:15 AM This report has been signed electronically.

## 2021-05-27 NOTE — Progress Notes (Signed)
Called to room to assist during endoscopic procedure.  Patient ID and intended procedure confirmed with present staff. Received instructions for my participation in the procedure from the performing physician.  

## 2021-05-27 NOTE — Progress Notes (Signed)
Pt BP elevated 194/ 123 this am. RN checked in both arms. Pt did not take her BP medication.  Consulting Dr [name]

## 2021-05-27 NOTE — Progress Notes (Signed)
[facility] History and Physical   Primary Care Physician:  [name], MD   Reason for Procedure:   Epigastric pain, weight loss  Plan:    EGD/Colonoscopy     HPI: Heather Garrett is a 70 y.o. female with a history of CAD, HLP and tobacco use with several months post-prandial epigastric pain with unintentional weight loss of 15lbs.  She was seen in clinic for these symptoms on August 29th.  She also has a history of colon polyps on her last colonoscopy in 2015 (details not available).  [place] w/ dopplers did not show evidence of mesenteric ischemia.  She reports no change in her postprandial pain since her clinic visit.  She also has frequent loose stools in the morning.    Past Medical History:  Diagnosis Date   Biceps tendinitis    Cobalamin deficiency    Coronary arteriosclerosis in native artery    Elevated liver function tests    Hyperlipidemia    Migraine    Rotator cuff syndrome    Transitional cell carcinoma, bladder (HCC)    Vitamin D deficiency     Past Surgical History:  Procedure Laterality Date   BREAST SURGERY     CARDIAC CATHETERIZATION     CATARACT EXTRACTION     CHOLECYSTECTOMY  1979   DG  BONE DENSITY ([place] HX)     ENDOMETRIAL ABLATION     GASTRIC BYPASS     MASTECTOMY     OTHER SURGICAL HISTORY     CANCER SURGERY    Prior to Admission medications   Medication Sig Start Date End Date Taking? Authorizing Provider  acetaminophen (TYLENOL) 500 MG tablet Take 1,000 mg by mouth every 6 (six) hours as needed for mild pain.   Yes [provider]  aspirin 81 MG EC tablet Take 81 mg by mouth every other day. Swallow whole.   Yes [provider]  carvedilol (COREG) 3.125 MG tablet Take 1 tablet (3.125 mg total) by mouth 2 (two) times daily. 05/17/21  Yes [name], MD  ezetimibe-simvastatin (VYTORIN) 10-80 MG tablet Take 1 tablet by mouth daily.   Yes [provider]  omeprazole (PRILOSEC) 20 MG  capsule Take 20 mg by mouth 2 (two) times daily before a meal.   Yes [provider]  Polyethylene Glycol 400 (BLINK TEARS) 0.25 % SOLN Apply 1 drop to eye daily as needed (dry eyes).   Yes [provider]  Probiotic Product (PROBIOTIC PO) Take 1 tablet by mouth daily.   Yes [provider]  sertraline (ZOLOFT) 100 MG tablet Take 100 mg by mouth daily.   Yes [provider]  tiZANidine (ZANAFLEX) 4 MG capsule Take 4 mg by mouth at bedtime.   Yes [provider]  topiramate (TOPAMAX) 50 MG tablet Take 50 mg by mouth at bedtime.   Yes [provider]  B Complex Vitamins (VITAMIN B COMPLEX PO) Take by mouth.    [provider]  calcium carbonate (TUMS - DOSED IN MG ELEMENTAL CALCIUM) 500 MG chewable tablet Chew 1-2 tablets by mouth daily as needed for indigestion or heartburn.    [provider]  hydrochlorothiazide (HYDRODIURIL) 25 MG tablet Take 25 mg by mouth daily.    [provider]  lisinopril (ZESTRIL) 5 MG tablet Take 1 tablet (5 mg total) by mouth daily. 02/12/21 05/14/21  Richardson Dopp T, PA-C  Vitamin D, Ergocalciferol, (DRISDOL) 1.25 MG (50000 UNIT) CAPS capsule Take 50,000 Units by mouth  once a week. Sunday Patient not taking: Reported on 05/27/2021 12/20/20   [provider]    Current Outpatient Medications  Medication Sig Dispense Refill   acetaminophen (TYLENOL) 500 MG tablet Take 1,000 mg by mouth every 6 (six) hours as needed for mild pain.     aspirin 81 MG EC tablet Take 81 mg by mouth every other day. Swallow whole.     carvedilol (COREG) 3.125 MG tablet Take 1 tablet (3.125 mg total) by mouth 2 (two) times daily. 180 tablet 3   ezetimibe-simvastatin (VYTORIN) 10-80 MG tablet Take 1 tablet by mouth daily.     omeprazole (PRILOSEC) 20 MG capsule Take 20 mg by mouth 2 (two) times daily before a meal.     Polyethylene Glycol 400 (BLINK TEARS) 0.25 % SOLN Apply 1 drop to eye daily as needed (dry  eyes).     Probiotic Product (PROBIOTIC PO) Take 1 tablet by mouth daily.     sertraline (ZOLOFT) 100 MG tablet Take 100 mg by mouth daily.     tiZANidine (ZANAFLEX) 4 MG capsule Take 4 mg by mouth at bedtime.     topiramate (TOPAMAX) 50 MG tablet Take 50 mg by mouth at bedtime.     B Complex Vitamins (VITAMIN B COMPLEX PO) Take by mouth.     calcium carbonate (TUMS - DOSED IN MG ELEMENTAL CALCIUM) 500 MG chewable tablet Chew 1-2 tablets by mouth daily as needed for indigestion or heartburn.     hydrochlorothiazide (HYDRODIURIL) 25 MG tablet Take 25 mg by mouth daily.     lisinopril (ZESTRIL) 5 MG tablet Take 1 tablet (5 mg total) by mouth daily. 90 tablet 3   Vitamin D, Ergocalciferol, (DRISDOL) 1.25 MG (50000 UNIT) CAPS capsule Take 50,000 Units by mouth once a week. Sunday (Patient not taking: Reported on 05/27/2021)     Current Facility-Administered Medications  Medication Dose Route Frequency Provider Last Rate Last Admin   0.9 %  sodium chloride infusion  500 mL Intravenous Once Daryel November, MD        Allergies as of 05/27/2021 - Review Complete 05/27/2021  Allergen Reaction Noted   Contrast media [iodinated diagnostic agents] Anaphylaxis and Hives 11/24/2020   Ciprofloxacin Other (See Comments) 11/24/2020   Erythromycin base Nausea And Vomiting 11/24/2020   Sulfabenzamide Other (See Comments) 11/24/2020    Family History  Problem Relation Age of Onset   Migraines Neg Hx     Social History   Socioeconomic History   Marital status: Married    Spouse name: Not on file   Number of children: Not on file   Years of education: Not on file   Highest education level: Not on file  Occupational History   Not on file  Tobacco Use   Smoking status: Former    Passive exposure: Never   Smokeless tobacco: Never   Tobacco comments:    Quit 30 yrs ago  Vaping Use   Vaping Use: Never used  Substance and Sexual Activity   Alcohol use: Not Currently    Alcohol/week: 14.0  standard drinks    Types: 14 Glasses of wine per week   Drug use: Never   Sexual activity: Not on file  Other Topics Concern   Not on file  Social History Narrative   Not on file   Social Determinants of Health   Financial Resource Strain: Not on file  Food Insecurity: Not on file  Transportation Needs: Not on file  Physical Activity: Not  on file  Stress: Not on file  Social Connections: Not on file  Intimate Partner Violence: Not on file    Review of Systems:  All other review of systems negative except as mentioned in the HPI.  Physical Exam: Vital signs BP (!) 191/122   Pulse (!) 109   Temp 97.6 F (36.4 C) (Skin)   Ht 4\' 8"  (1.422 m)   Wt 101 lb (45.8 kg)   SpO2 98%   BMI 22.64 kg/m   General:   Alert,  Well-developed, well-nourished, pleasant and cooperative in NAD Airway:  Mallampati 2 Lungs:  Clear throughout to auscultation.   Heart:  Regular rate and rhythm; no murmurs, clicks, rubs,  or gallops. Abdomen:  Soft, nontender and nondistended. Normal bowel sounds.   Neuro/Psych:  Normal mood and affect. A and O x 3   Anniemae Haberkorn E. [name], MD [facility]

## 2021-05-27 NOTE — Op Note (Signed)
[place] Patient Name: Heather Garrett Procedure Date: 05/27/2021 7:32 AM MRN: [id number] Endoscopist: Nicki Reaper E. [name] , MD Age: 70 Referring MD:  Date of Birth: [birth year] Gender: Female Account #: [id number] Procedure:                Colonoscopy Indications:              High risk colon cancer surveillance: Personal                            history of colonic polyps Medicines:                Monitored Anesthesia Care Procedure:                Pre-Anesthesia Assessment:                           - Prior to the procedure, a History and Physical                            was performed, and patient medications and                            allergies were reviewed. The patient's tolerance of                            previous anesthesia was also reviewed. The risks                            and benefits of the procedure and the sedation                            options and risks were discussed with the patient.                            All questions were answered, and informed consent                            was obtained. Prior Anticoagulants: The patient has                            taken no previous anticoagulant or antiplatelet                            agents except for aspirin. ASA Grade Assessment: II                            - A patient with mild systemic disease. After                            reviewing the risks and benefits, the patient was                            deemed in satisfactory condition to undergo the  procedure.                           After obtaining informed consent, the colonoscope                            was passed under direct vision. Throughout the                            procedure, the patient's blood pressure, pulse, and                            oxygen saturations were monitored continuously. The                            Olympus PCF-H190DL (#[id number]) Colonoscope was                             introduced through the anus and advanced to the the                            terminal ileum, with identification of the                            appendiceal orifice and IC valve. The colonoscopy                            was somewhat difficult due to a tortuous colon.                            Successful completion of the procedure was aided by                            using manual pressure. The patient tolerated the                            procedure well. The quality of the bowel                            preparation was adequate. The terminal ileum,                            ileocecal valve, appendiceal orifice, and rectum                            were photographed. The bowel preparation used was                            Plenvu via split dose instruction. Scope In: 8:24:34 AM Scope Out: 9:03:19 AM Scope Withdrawal Time: 0 hours 32 minutes 48 seconds  Total Procedure Duration: 0 hours 38 minutes 45 seconds  Findings:                 The perianal and digital rectal examinations were  normal. Pertinent negatives include normal                            sphincter tone and no palpable rectal lesions.                           Three sessile polyps were found in the cecum. The                            polyps were 3 to 5 mm in size. These polyps were                            removed with a cold snare. Resection and retrieval                            were complete. Estimated blood loss was minimal.                           Two sessile polyps were found in the ascending                            colon. The polyps were 3 to 12 mm in size. These                            polyps were removed with a cold snare. Resection                            and retrieval were complete. Estimated blood loss                            was minimal.                           A 10 mm polyp was found in the hepatic flexure. The                             polyp was sessile. The polyp was removed with a                            cold snare. Resection and retrieval were complete.                            Estimated blood loss was minimal.                           Three sessile polyps were found in the transverse                            colon. The polyps were 8 to 15 mm in size. These                            polyps were removed with a cold snare. Resection  and retrieval were complete. Estimated blood loss                            was minimal. To prevent bleeding after the                            polypectomy, two hemostatic clips were successfully                            placed (MR conditional) on 2 separate polypectomy                            sites. There was no bleeding at the end of the                            procedure.                           A 4 mm polyp was found in the sigmoid colon. The                            polyp was sessile. The polyp was removed with a                            cold snare. Resection and retrieval were complete.                            Estimated blood loss was minimal.                           A few medium-mouthed diverticula were found in the                            descending colon.                           The exam was otherwise normal throughout the                            examined colon.                           The terminal ileum appeared normal.                           Non-bleeding internal hemorrhoids were found during                            retroflexion. The hemorrhoids were Grade I                            (internal hemorrhoids that do not prolapse).                           No additional abnormalities were found on  retroflexion. Complications:            No immediate complications. Estimated Blood Loss:     Estimated blood loss was minimal. Impression:               - Three 3 to 5 mm polyps in the  cecum, removed with                            a cold snare. Resected and retrieved.                           - Two 3 to 12 mm polyps in the ascending colon,                            removed with a cold snare. Resected and retrieved.                           - One 10 mm polyp at the hepatic flexure, removed                            with a cold snare. Resected and retrieved.                           - Three 8 to 15 mm polyps in the transverse colon,                            removed with a cold snare. Resected and retrieved.                            Two clips (MR conditional) were placed to prevent                            post polypectomy bleeding.                           - One 4 mm polyp in the sigmoid colon, removed with                            a cold snare. Resected and retrieved.                           - Diverticulosis in the descending colon.                           - The examined portion of the ileum was normal.                           - Non-bleeding internal hemorrhoids. Recommendation:           - Patient has a contact number available for                            emergencies. The signs and symptoms of potential  delayed complications were discussed with the                            patient. Return to normal activities tomorrow.                            Written discharge instructions were provided to the                            patient.                           - Resume previous diet.                           - Continue present medications.                           - Await pathology results.                           - Repeat colonoscopy in 1 year for surveillance. Kayne Yuhas E. [name], MD 05/27/2021 9:28:08 AM This report has been signed electronically.

## 2021-05-27 NOTE — Progress Notes (Signed)
Pt's states no medical or surgical changes since previsit or office visit. VS assessed by C.W 

## 2021-05-31 ENCOUNTER — Telehealth: Payer: Self-pay

## 2021-05-31 ENCOUNTER — Ambulatory Visit: Payer: Medicare PPO

## 2021-05-31 NOTE — Telephone Encounter (Signed)
  Follow up Call-  Call back number 05/27/2021 05/27/2021  Post procedure Call Back phone  # [phone]  Permission to leave phone message Yes Yes  Some recent data might be hidden     Patient questions:  Do you have a fever, pain , or abdominal swelling? No. Pain Score  0 *  Have you tolerated food without any problems? Yes.    Have you been able to return to your normal activities? Yes.    Do you have any questions about your discharge instructions: Diet   No. Medications  No. Follow up visit  No.  Do you have questions or concerns about your Care? No.  Actions: * If pain score is 4 or above: No action needed, pain <4.

## 2021-06-01 NOTE — Progress Notes (Signed)
Heather Garrett,   All of the 10 polyps that I removed during your recent procedure were completely benign but were proven to be "pre-cancerous" polyps that MAY have grown into cancers if they had not been removed.  Studies shows that at least 20% of women over age 71 and 30% of men over age 1 have pre-cancerous polyps.  Based on current nationally recognized surveillance guidelines, I recommend that you have a repeat colonoscopy in 1 year.  The biopsies of your stomach and gastrojejunal anastomosis were normal.  There was no evidence Helicobacter pylori infection and no inflammatory changes.  Please follow up with me in clinic to further discuss your chronic symptoms.

## 2021-06-08 ENCOUNTER — Institutional Professional Consult (permissible substitution): Payer: Medicare PPO | Admitting: Neurology

## 2021-06-15 ENCOUNTER — Other Ambulatory Visit (HOSPITAL_COMMUNITY): Payer: Self-pay | Admitting: Urology

## 2021-06-15 ENCOUNTER — Other Ambulatory Visit: Payer: Self-pay | Admitting: Urology

## 2021-06-15 DIAGNOSIS — Z8551 Personal history of malignant neoplasm of bladder: Secondary | ICD-10-CM

## 2021-06-16 ENCOUNTER — Other Ambulatory Visit (INDEPENDENT_AMBULATORY_CARE_PROVIDER_SITE_OTHER): Payer: Medicare PPO

## 2021-06-16 ENCOUNTER — Encounter: Payer: Self-pay | Admitting: Gastroenterology

## 2021-06-16 ENCOUNTER — Ambulatory Visit (INDEPENDENT_AMBULATORY_CARE_PROVIDER_SITE_OTHER): Payer: Medicare PPO | Admitting: Gastroenterology

## 2021-06-16 VITALS — BP 146/80 | HR 86 | Ht <= 58 in | Wt 105.4 lb

## 2021-06-16 DIAGNOSIS — R1084 Generalized abdominal pain: Secondary | ICD-10-CM

## 2021-06-16 DIAGNOSIS — F1010 Alcohol abuse, uncomplicated: Secondary | ICD-10-CM | POA: Diagnosis not present

## 2021-06-16 DIAGNOSIS — R197 Diarrhea, unspecified: Secondary | ICD-10-CM

## 2021-06-16 LAB — COMPREHENSIVE METABOLIC PANEL
ALT: 90 U/L — ABNORMAL HIGH (ref 0–35)
AST: 185 U/L — ABNORMAL HIGH (ref 0–37)
Albumin: 3.3 g/dL — ABNORMAL LOW (ref 3.5–5.2)
Alkaline Phosphatase: 320 U/L — ABNORMAL HIGH (ref 39–117)
BUN: 21 mg/dL (ref 6–23)
CO2: 21 mEq/L (ref 19–32)
Calcium: 8.8 mg/dL (ref 8.4–10.5)
Chloride: 109 mEq/L (ref 96–112)
Creatinine, Ser: 0.82 mg/dL (ref 0.40–1.20)
GFR: 72 mL/min (ref >60.00)
Glucose, Bld: 83 mg/dL (ref 70–99)
Potassium: 4.6 mEq/L (ref 3.5–5.1)
Sodium: 139 mEq/L (ref 135–145)
Total Bilirubin: 0.3 mg/dL (ref 0.2–1.2)
Total Protein: 6.6 g/dL (ref 6.0–8.3)

## 2021-06-16 LAB — IBC PANEL
Iron: 93 ug/dL (ref 42–145)
Saturation Ratios: 34.2 % (ref 20.0–50.0)
TIBC: 271.6 ug/dL (ref 250.0–450.0)
Transferrin: 194 mg/dL — ABNORMAL LOW (ref 212.0–360.0)

## 2021-06-16 LAB — FERRITIN: Ferritin: 196.6 ng/mL (ref 10.0–291.0)

## 2021-06-16 LAB — VITAMIN B12: Vitamin B-12: 349 pg/mL (ref 211–911)

## 2021-06-16 LAB — FOLATE: Folate: 10.1 ng/mL (ref >5.9)

## 2021-06-16 NOTE — Patient Instructions (Addendum)
If you are age 71 or older, your body mass index should be between 23-30. Your Body mass index is 23.63 kg/m. If this is out of the aforementioned range listed, please consider follow up with your Primary Care Provider.  If you are age 71 or younger, your body mass index should be between 19-25. Your Body mass index is 23.63 kg/m. If this is out of the aformentioned range listed, please consider follow up with your Primary Care Provider.   You will be contacted by [facility] in the next 2 days to arrange a Ultrasound.  The number on your caller ID will be [phone], please answer when they call.  If you have not heard from them in 2 days please call [phone] to schedule.    Start Metamucil daily.  Continue loperamide daily.  Your provider has requested that you go to the basement level for lab work before leaving today. Press "B" on the elevator. The lab is located at the first door on the left as you exit the elevator.   The [facility] providers would like to encourage you to use [facility] to communicate with providers for non-urgent requests or questions.  Due to long hold times on the telephone, sending your provider a message by [facility] may be a faster and more efficient way to get a response.  Please allow 48 business hours for a response.  Please remember that this is for non-urgent requests.   Due to recent changes in healthcare laws, you may see the results of your imaging and laboratory studies on MyChart before your provider has had a chance to review them.  We understand that in some cases there may be results that are confusing or concerning to you. Not all laboratory results come back in the same time frame and the provider may be waiting for multiple results in order to interpret others.  Please give [place] 48 hours in order for your provider to thoroughly review all the results before contacting the office for clarification of your results.    It was a pleasure  to see you today!  Thank you for trusting me with your gastrointestinal care!    Scott E.[name], MD

## 2021-06-16 NOTE — Progress Notes (Signed)
HPI : Heather Garrett is a pleasant 70 year old female s/p gastric bypass who I saw in August with significant post prandial abdominal pain and sitophobia with weight loss.  She had a normal Doppler study of the mesenteric vessels to evaluate for chronic intestinal ischemia.  CT with contrast not possible due to severe contrast allergy.  She underwent an EGD and colonoscopy Nov 17th.  The EGD showed an enlarged gastric pouch with a hiatal hernia, but was otherwise unremarkable.  No marginal ulcer present.  The colonoscopy was notable for 10 adenomatous polyps and she was recommended to repeat in 1 year.   Today, she reports that her abdominal pain is improved.  She has modified her diet so that she only eats a few bites at at time. When doing this, she has no pain.  She has regained a small amount of weight that she lost.  She eats a normal diet and does not exclude any particular foods. She has been having frequent bowel movements, typically with 2-3 bowel movements, all clustered in the morning, usually with significant urgency, no incontinence.  Stools are typically loose and poorly formed, sometimes watery.  This has been going on for a few months.  No changes in diet or changes in medications.  She has been taking Imodium as needed, usually after the third bowel movement of the day. She takes omeprazole, but is not sure why.  She has been taking for at least 10 years. She denies symptoms of reflux such as heartburn or acid regurgitation.   She was also noted to have elevated liver enzymes (AST predominant) and macrocytosis.  She has a history of chronic heavy alcohol use.  Currently she has usually 3 glasses of prosecco a day, which has been her pattern for the past 6 months or so.  Previously she drank more.  She is seeing a counselor Towanda Octave) to help her with alcohol cessation.   Past Medical History:  Diagnosis Date   Biceps tendinitis    Cobalamin deficiency    Coronary arteriosclerosis  in native artery    Elevated liver function tests    Hyperlipidemia    Migraine    Rotator cuff syndrome    Transitional cell carcinoma, bladder (HCC)    Vitamin D deficiency      Past Surgical History:  Procedure Laterality Date   BREAST SURGERY     CARDIAC CATHETERIZATION     CATARACT EXTRACTION     CHOLECYSTECTOMY  1979   DG  BONE DENSITY ([place] [facility])     ENDOMETRIAL ABLATION     GASTRIC BYPASS     MASTECTOMY     OTHER SURGICAL HISTORY     CANCER SURGERY   Family History  Problem Relation Age of Onset   Migraines Neg Hx    Social History   Tobacco Use   Smoking status: Former    Passive exposure: Never   Smokeless tobacco: Never   Tobacco comments:    Quit 30 yrs ago  Vaping Use   Vaping Use: Never used  Substance Use Topics   Alcohol use: Not Currently    Alcohol/week: 14.0 standard drinks    Types: 14 Glasses of wine per week   Drug use: Never   Current Outpatient Medications  Medication Sig Dispense Refill   acetaminophen (TYLENOL) 500 MG tablet Take 1,000 mg by mouth every 6 (six) hours as needed for mild pain.     aspirin 81 MG EC tablet Take 81  mg by mouth every other day. Swallow whole.     B Complex Vitamins (VITAMIN B COMPLEX PO) Take by mouth.     calcium carbonate (TUMS - DOSED IN MG ELEMENTAL CALCIUM) 500 MG chewable tablet Chew 1-2 tablets by mouth daily as needed for indigestion or heartburn.     carvedilol (COREG) 3.125 MG tablet Take 1 tablet (3.125 mg total) by mouth 2 (two) times daily. 180 tablet 3   ezetimibe-simvastatin (VYTORIN) 10-80 MG tablet Take 1 tablet by mouth daily.     omeprazole (PRILOSEC) 20 MG capsule Take 20 mg by mouth 2 (two) times daily before a meal.     Polyethylene Glycol 400 (BLINK TEARS) 0.25 % SOLN Apply 1 drop to eye daily as needed (dry eyes).     Probiotic Product (PROBIOTIC PO) Take 1 tablet by mouth daily.     sertraline (ZOLOFT) 100 MG tablet Take 100 mg by mouth daily.     tiZANidine (ZANAFLEX) 4 MG capsule Take  4 mg by mouth at bedtime.     topiramate (TOPAMAX) 50 MG tablet Take 50 mg by mouth at bedtime.     valsartan (DIOVAN) 320 MG tablet Take 320 mg by mouth daily.     Vitamin D, Ergocalciferol, (DRISDOL) 1.25 MG (50000 UNIT) CAPS capsule Take 50,000 Units by mouth once a week. Sunday     No current facility-administered medications for this visit.   Allergies  Allergen Reactions   Contrast Media [Iodinated Diagnostic Agents] Anaphylaxis and Hives   Ciprofloxacin Other (See Comments)    Interaction w other medicine   Erythromycin Base Nausea And Vomiting   Sulfabenzamide Other (See Comments)    UNK reaction     Review of Systems: All systems reviewed and negative except where noted in HPI.    No results found.  Physical Exam: BP (!) 146/80   Pulse 86   Ht 4\' 8"  (1.422 m)   Wt 105 lb 6.4 oz (47.8 kg)   BMI 23.63 kg/m  Constitutional: Pleasant,well-developed, thin Caucasian female in no acute distress. HEENT: Normocephalic and atraumatic. Conjunctivae are normal. No scleral icterus. Cardiovascular: Normal rate, regular rhythm.  Pulmonary/chest: Effort normal and breath sounds normal. No wheezing, rales or rhonchi. Abdominal: Soft, nondistended, nontender. Bowel sounds active throughout. There are no masses palpable. No hepatomegaly. Extremities: no edema Neurological: Alert and oriented to person place and time. Skin: Skin is warm and dry. No rashes noted. Psychiatric: Normal mood and affect. Behavior is normal.  CBC    Component Value Date/Time   WBC 8.1 01/26/2021 0900   RBC 3.56 (L) 01/26/2021 0900   HGB 11.9 (L) 01/26/2021 0900   HGB 10.9 (L) 11/25/2020 1219   HCT 36.5 01/26/2021 0900   HCT 33.9 (L) 11/25/2020 1219   PLT 203 01/26/2021 0900   PLT 180 11/25/2020 1219   MCV 102.5 (H) 01/26/2021 0900   MCV 97 11/25/2020 1219   MCH 33.4 01/26/2021 0900   MCHC 32.6 01/26/2021 0900   RDW 15.7 (H) 01/26/2021 0900   RDW 14.2 11/25/2020 1219   LYMPHSABS 1.5 01/26/2021  0900   MONOABS 1.0 01/26/2021 0900   EOSABS 0.1 01/26/2021 0900   BASOSABS 0.0 01/26/2021 0900    CMP     Component Value Date/Time   NA 138 01/26/2021 0900   NA 142 12/08/2020 1056   K 4.7 01/26/2021 0900   CL 106 01/26/2021 0900   CO2 24 01/26/2021 0900   GLUCOSE 88 01/26/2021 0900   BUN 13 01/26/2021  0900   BUN 8 12/08/2020 1056   CREATININE 0.74 01/26/2021 0900   CALCIUM 8.9 01/26/2021 0900   PROT 6.3 (L) 01/26/2021 0900   ALBUMIN 2.9 (L) 01/26/2021 0900   AST 47 (H) 01/26/2021 0900   ALT 28 01/26/2021 0900   ALKPHOS 262 (H) 01/26/2021 0900   BILITOT 1.0 01/26/2021 0900   GFRNONAA >60 01/26/2021 0900     ASSESSMENT AND PLAN: 70 year old female with history of RNYGB and problems with chronic postprandial abdominal pain which have improved since patient has adjusted her eating habits.  Mesenteric ischemia still a possible explanation for her postprandial pain, as patient states that she typically only 6-7 bites of food at a time, but if she eats much more, she will get pain.  As she is maintaining her weight and currently happy with her symptom control, will hold off on any further evaluation at this time. For her diarrhea with urgency, I suggested trying Metamucil for a few weeks to see if this would improve her stool bulk and hopefully reduce fecal urgency.  She can continue to use Imodium as needed.  We discussed her alcohol consumption and the likely deleterious effect it is having on her, given her elevated LAEs and macrocytosis.  I explained that she was at higher risk of complications from alcohol use given her female sex, s/p gastric bypass status and small stature.  I applauded her for working with a therapist to help her with cessation.  Will rule out other potential causes of concomitant liver disease (viral, autoimmune, metabolic) and get ultrasound to assess for evidence of steatosis.   Diarrhea w/ urgency -  Metamucil  - Imodium  Alcohol induced liver disease -  Ultrasound - R/o other causes of liver disease - Continue outpatient counseling for EtOH cessatino  Kathreen Dileo E. [name], MD [facility]   CC:  [name], MD

## 2021-06-17 ENCOUNTER — Encounter: Payer: Self-pay | Admitting: Neurology

## 2021-06-17 ENCOUNTER — Institutional Professional Consult (permissible substitution): Payer: Medicare PPO | Admitting: Neurology

## 2021-06-21 ENCOUNTER — Telehealth: Payer: Self-pay | Admitting: Neurology

## 2021-06-21 NOTE — Telephone Encounter (Signed)
Pt. Missed Sleep consult appt on 12/8 but had lvm on my phone on 12/7 stating she needed to cancel appt.

## 2021-06-24 LAB — MITOCHONDRIAL ANTIBODIES: Mitochondrial M2 Ab, IgG: <=20 U (ref <=20.0)

## 2021-06-24 LAB — HEPATITIS C ANTIBODY
Hepatitis C Ab: NONREACTIVE
SIGNAL TO CUT-OFF: 0.11 (ref <1.00)

## 2021-06-24 LAB — HEPATITIS B SURFACE ANTIBODY,QUALITATIVE: Hep B S Ab: NONREACTIVE

## 2021-06-24 LAB — TISSUE TRANSGLUTAMINASE, IGA: (tTG) Ab, IgA: 1.5 U/mL

## 2021-06-24 LAB — CERULOPLASMIN: Ceruloplasmin: 22 mg/dL (ref 18–53)

## 2021-06-24 LAB — ALPHA-1-ANTITRYPSIN: A-1 Antitrypsin, Ser: 158 mg/dL (ref 83–199)

## 2021-06-24 LAB — ANTI-SMOOTH MUSCLE ANTIBODY, IGG: Actin (Smooth Muscle) Antibody (IGG): <20 U (ref <20)

## 2021-06-24 LAB — ANA: Anti Nuclear Antibody (ANA): NEGATIVE

## 2021-06-24 LAB — HEPATITIS B SURFACE ANTIGEN: Hepatitis B Surface Ag: NONREACTIVE

## 2021-06-28 ENCOUNTER — Ambulatory Visit (HOSPITAL_COMMUNITY): Payer: Medicare PPO

## 2021-06-28 ENCOUNTER — Ambulatory Visit (HOSPITAL_COMMUNITY): Admission: RE | Admit: 2021-06-28 | Payer: Medicare PPO | Source: Ambulatory Visit

## 2021-06-29 ENCOUNTER — Ambulatory Visit (HOSPITAL_COMMUNITY): Admission: RE | Admit: 2021-06-29 | Payer: Medicare PPO | Source: Ambulatory Visit

## 2021-06-30 ENCOUNTER — Encounter (HOSPITAL_BASED_OUTPATIENT_CLINIC_OR_DEPARTMENT_OTHER): Payer: Self-pay

## 2021-06-30 ENCOUNTER — Other Ambulatory Visit: Payer: Self-pay

## 2021-06-30 ENCOUNTER — Emergency Department (HOSPITAL_BASED_OUTPATIENT_CLINIC_OR_DEPARTMENT_OTHER): Payer: Medicare PPO | Admitting: Radiology

## 2021-06-30 ENCOUNTER — Emergency Department (HOSPITAL_BASED_OUTPATIENT_CLINIC_OR_DEPARTMENT_OTHER)
Admission: EM | Admit: 2021-06-30 | Discharge: 2021-06-30 | Disposition: A | Discharge status: Home / Self Care | Payer: Medicare PPO | Attending: Emergency Medicine | Admitting: Emergency Medicine

## 2021-06-30 ENCOUNTER — Emergency Department (HOSPITAL_BASED_OUTPATIENT_CLINIC_OR_DEPARTMENT_OTHER): Payer: Medicare PPO

## 2021-06-30 DIAGNOSIS — Z8551 Personal history of malignant neoplasm of bladder: Secondary | ICD-10-CM | POA: Diagnosis not present

## 2021-06-30 DIAGNOSIS — M25552 Pain in left hip: Secondary | ICD-10-CM | POA: Insufficient documentation

## 2021-06-30 DIAGNOSIS — R197 Diarrhea, unspecified: Secondary | ICD-10-CM | POA: Insufficient documentation

## 2021-06-30 DIAGNOSIS — Z87891 Personal history of nicotine dependence: Secondary | ICD-10-CM | POA: Diagnosis not present

## 2021-06-30 DIAGNOSIS — N3000 Acute cystitis without hematuria: Secondary | ICD-10-CM

## 2021-06-30 DIAGNOSIS — I2510 Atherosclerotic heart disease of native coronary artery without angina pectoris: Secondary | ICD-10-CM | POA: Diagnosis not present

## 2021-06-30 LAB — URINALYSIS, ROUTINE W REFLEX MICROSCOPIC
Bilirubin Urine: NEGATIVE
Glucose, UA: NEGATIVE mg/dL
Hgb urine dipstick: NEGATIVE
Ketones, ur: NEGATIVE mg/dL
Nitrite: NEGATIVE
Specific Gravity, Urine: 1.014 (ref 1.005–1.030)
WBC, UA: >50 WBC/hpf — ABNORMAL HIGH (ref 0–5)
pH: 5.5 (ref 5.0–8.0)

## 2021-06-30 IMAGING — DX DG HIP (WITH OR WITHOUT PELVIS) 2-3V*L*
3 series · 3 of 3 positions shown · non-contrast
Comparison: None.

CLINICAL DATA: Left hip pain

EXAM:
DG HIP (WITH OR WITHOUT PELVIS) 2-3V LEFT

[pelvis ap]
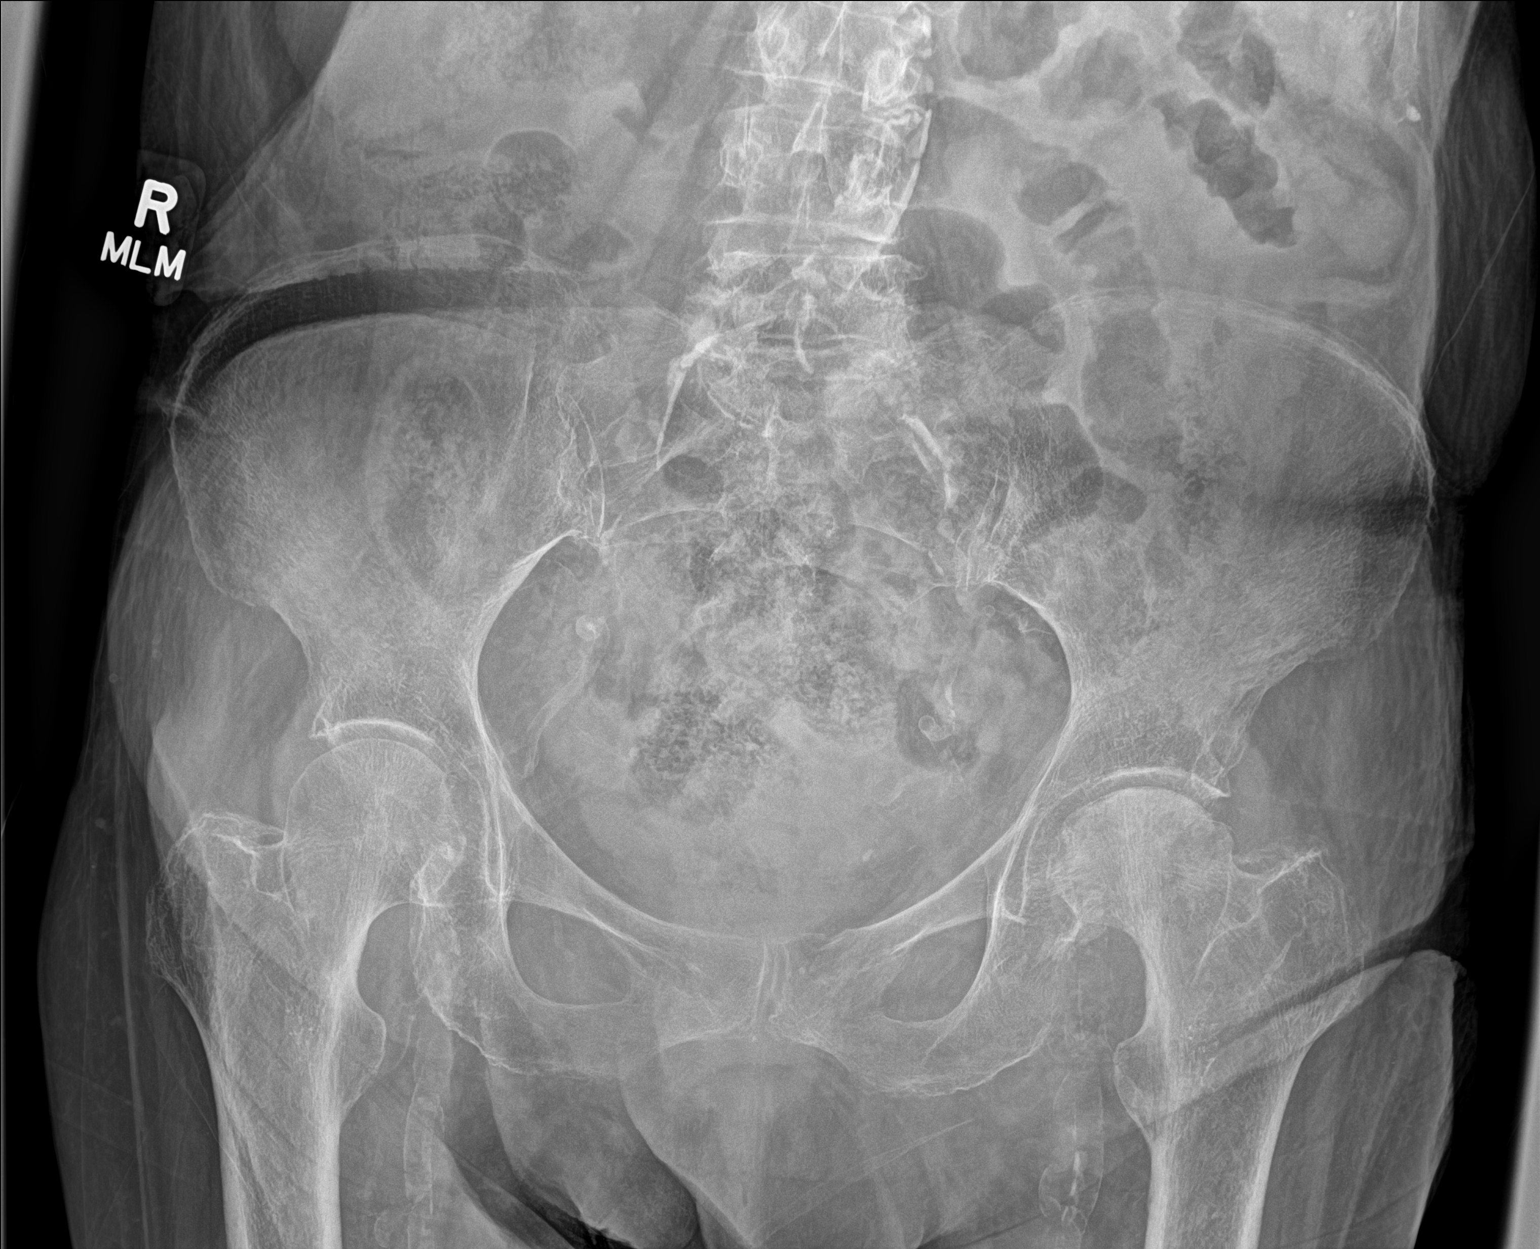

[hip ap]
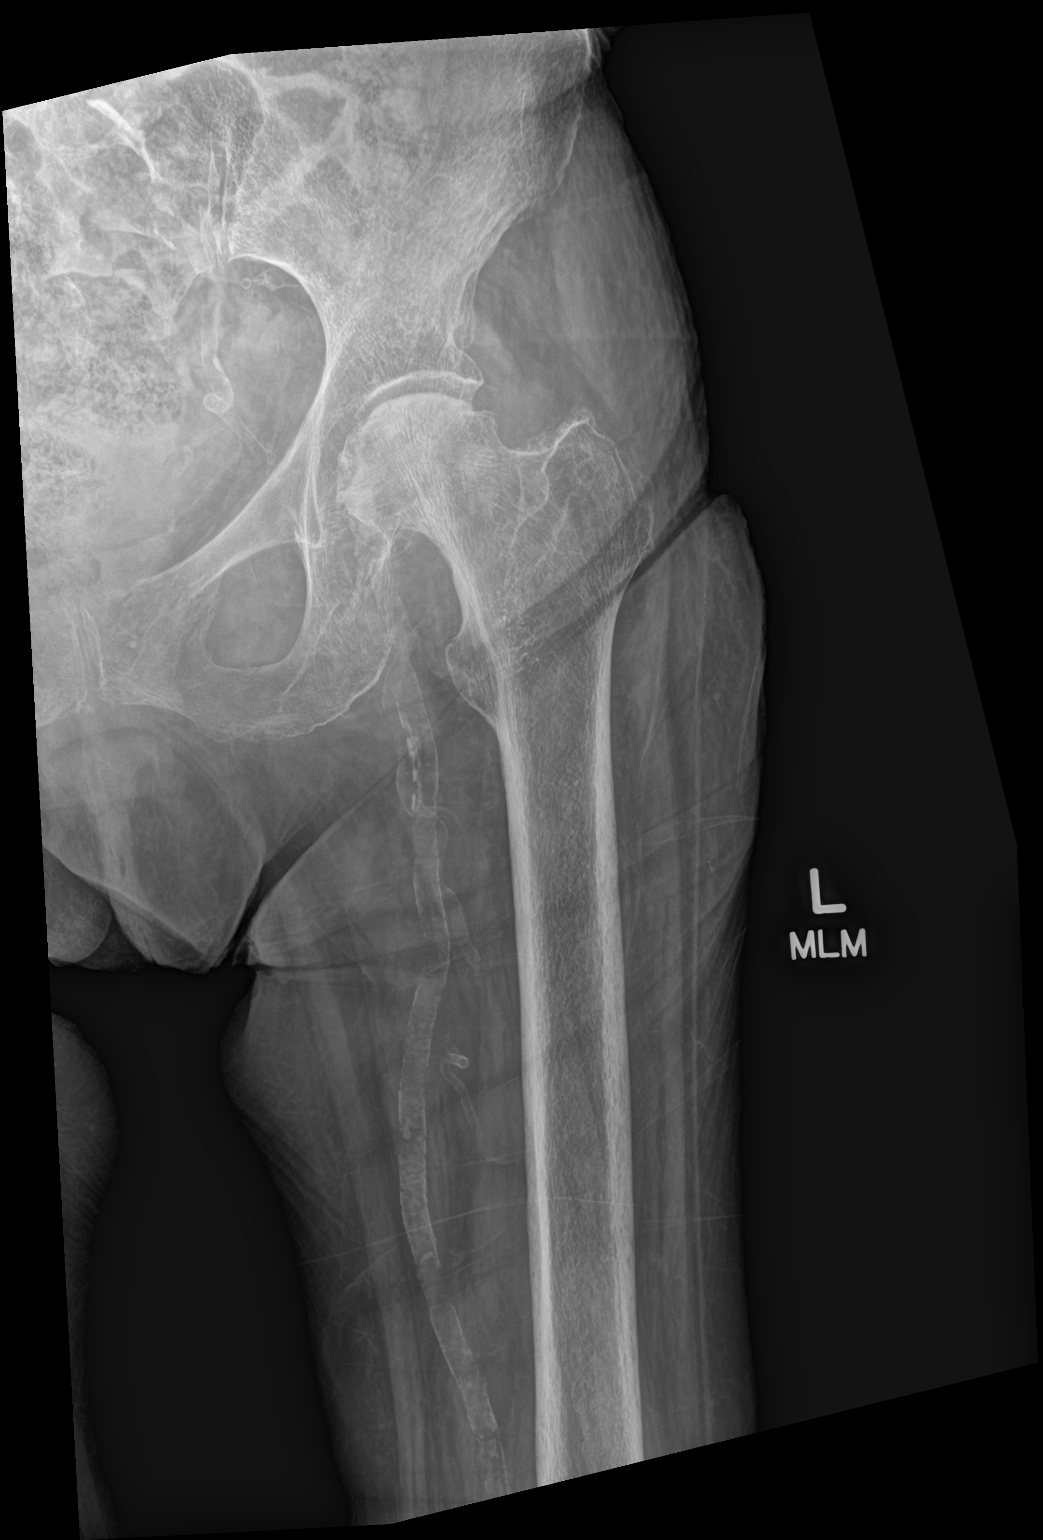

[hip lat]
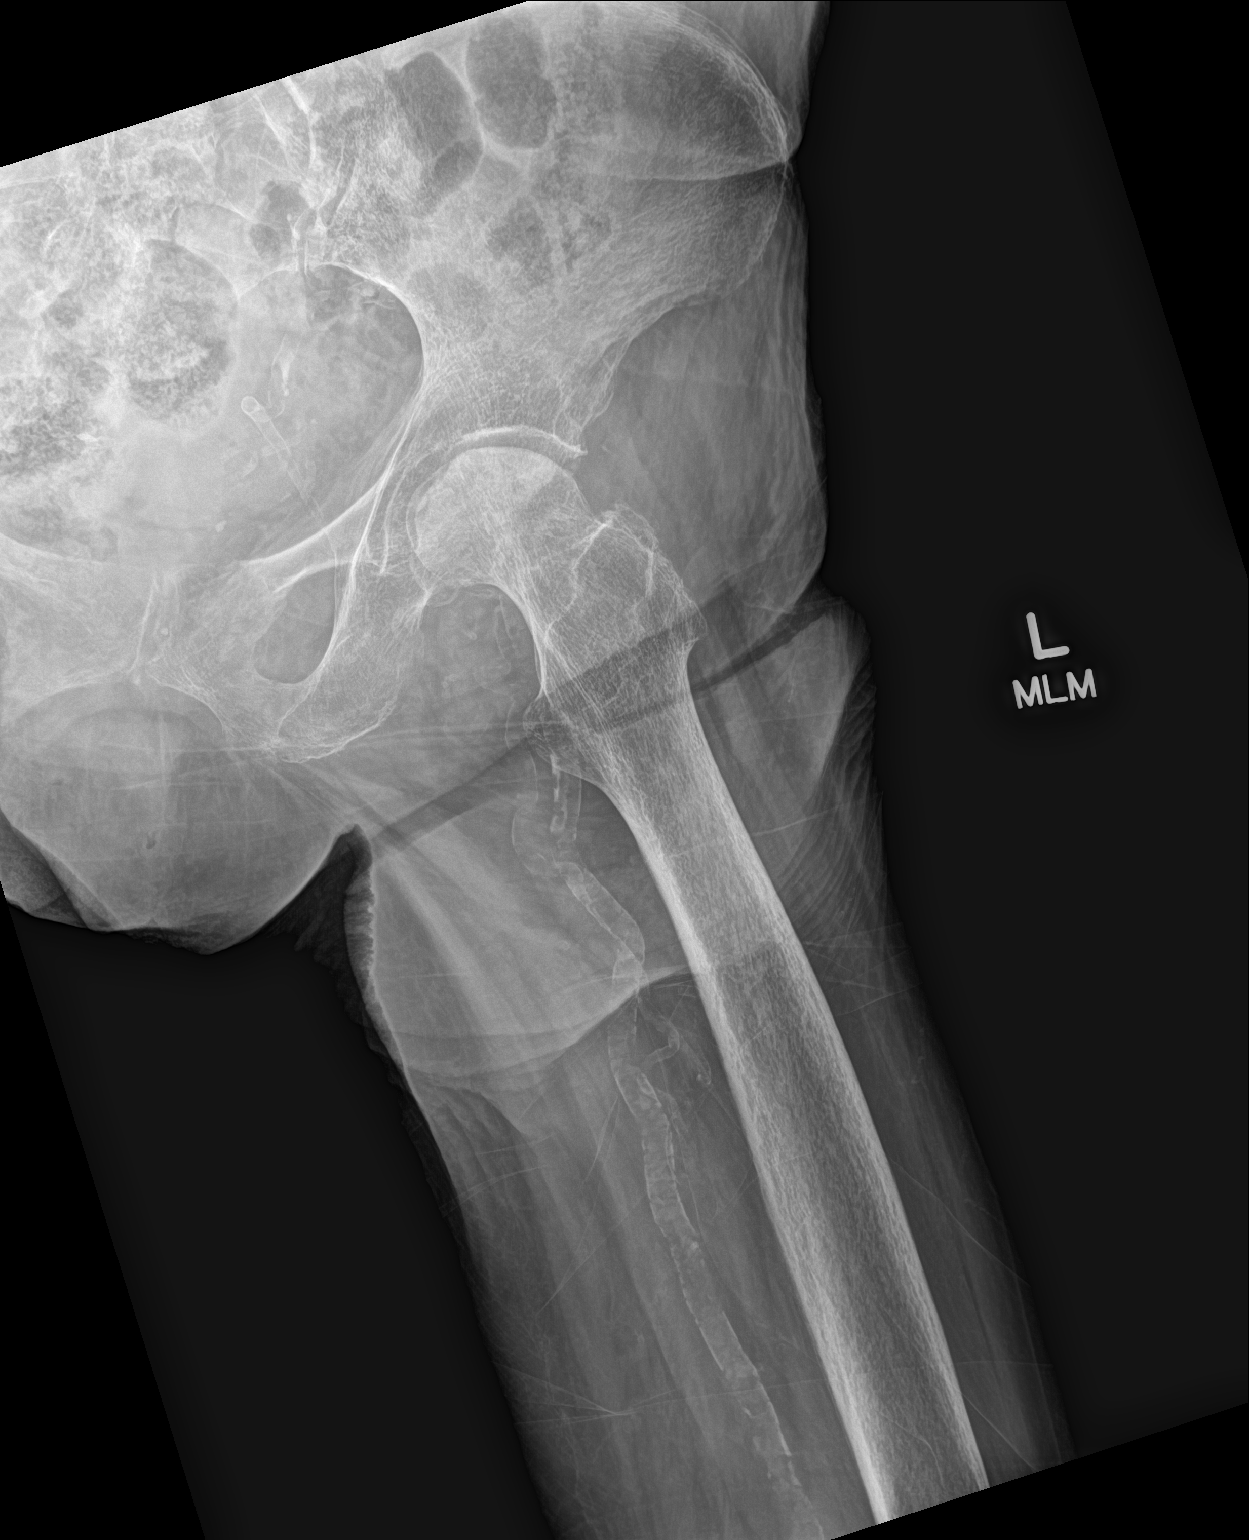

[3 of 3 positions shown; findings below may reference images not displayed]

FINDINGS: Bones of the pelvis appear normal. Right hip appears normal. There
is flattening and sclerosis of the femoral head on the left
consistent with sequela of avascular necrosis. No sign of acute
femoral neck fracture or intertrochanteric fracture. Regional
arterial calcification incidentally noted.
IMPRESSION: Flattening and sclerotic change of the left femoral head consistent
with sequela of avascular necrosis. Cannot rule out a recent
subchondral collapse. No fracture of the femoral neck or
intertrochanteric region.

## 2021-06-30 IMAGING — US US EXTREM LOW VENOUS*L*
1 series · 13 of 24 positions shown · non-contrast
Comparison: Prior bilateral venous duplex study on [DATE]

CLINICAL DATA: Left lower extremity pain.  Recent fall.



[Series 1: us venous img lower uni left (dvt) · portal-venous · 13 of 44 slices shown]
[im 1/44]
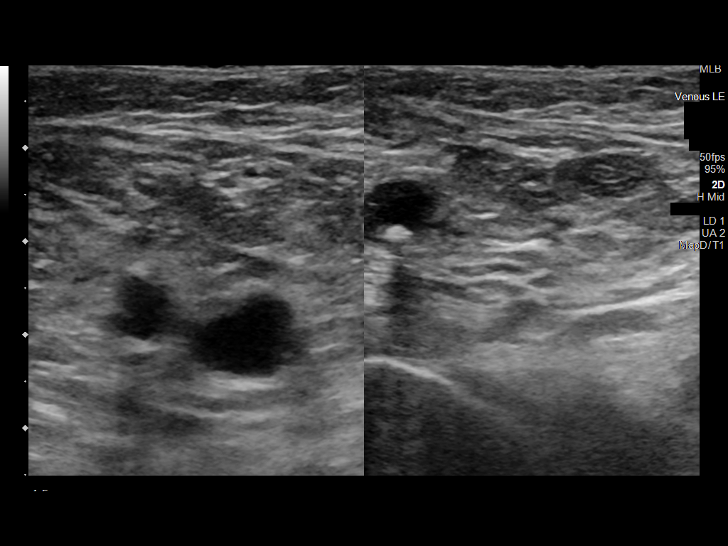
[im 4/44]
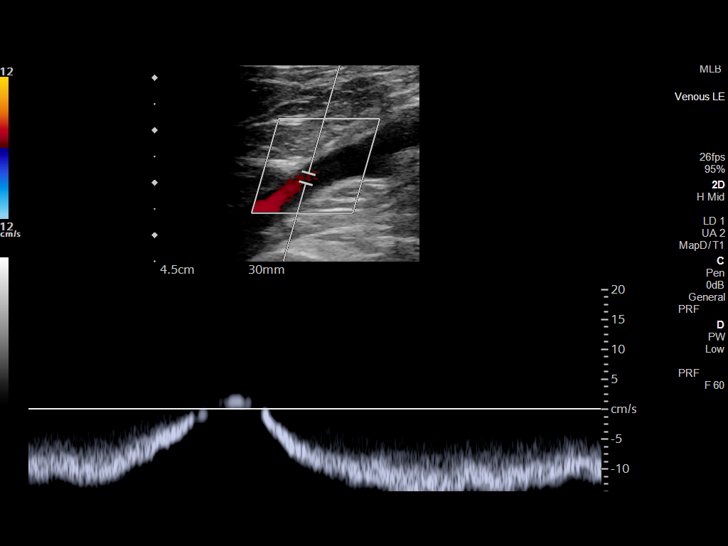
[im 8/44]
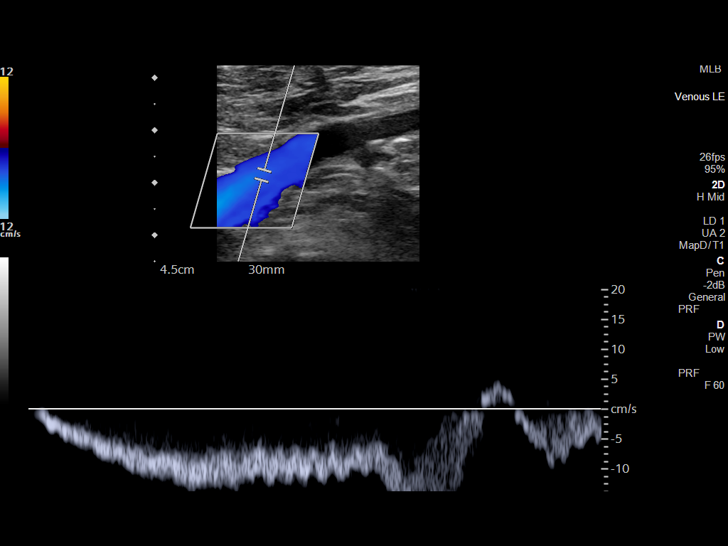
[im 12/44]
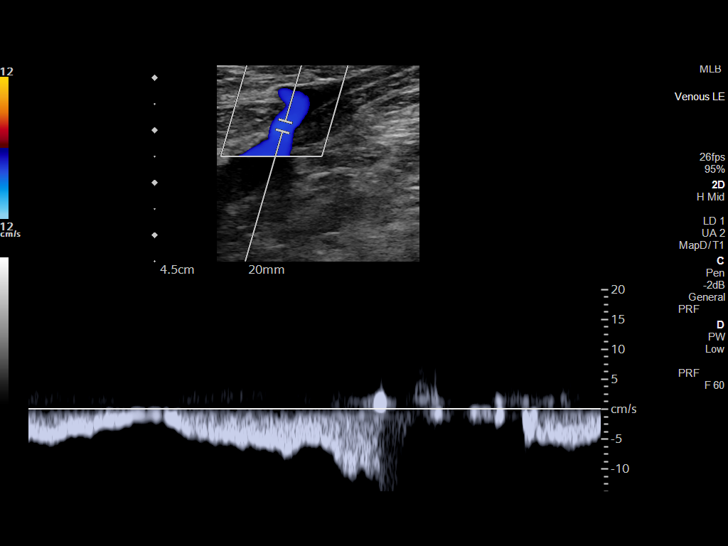
[im 15/44]
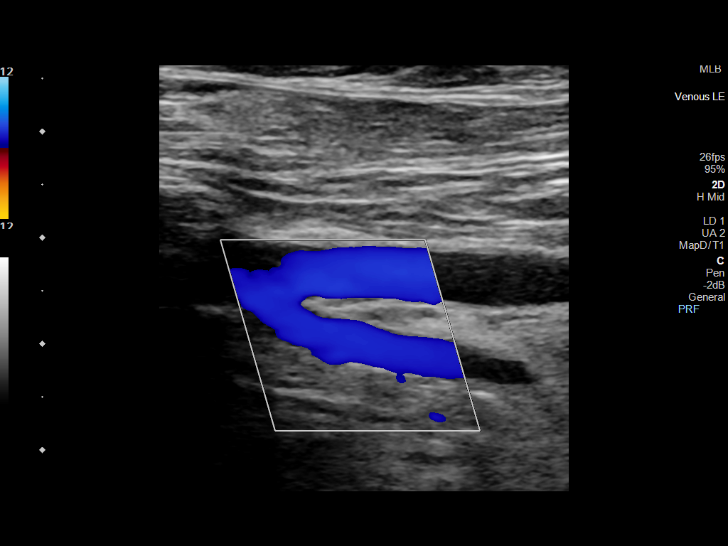
[im 19/44]
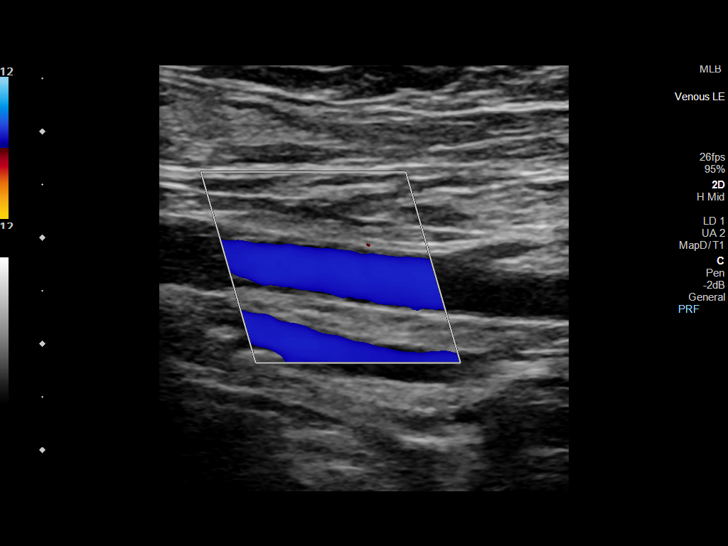
[im 23/44]
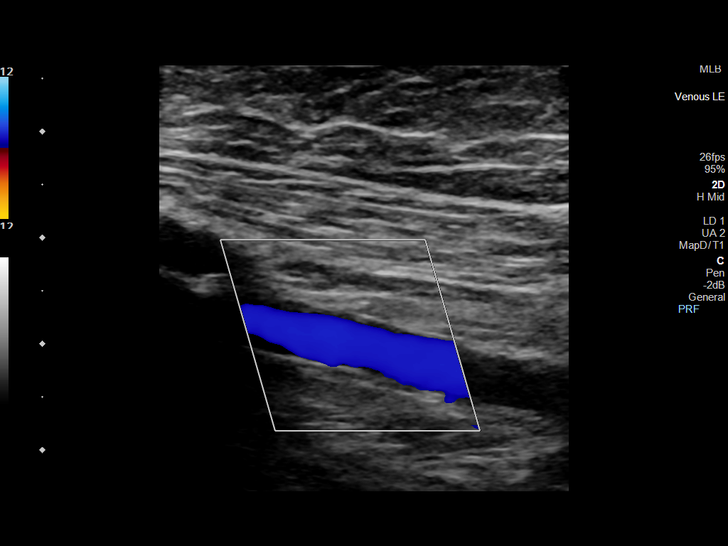
[im 25/44]
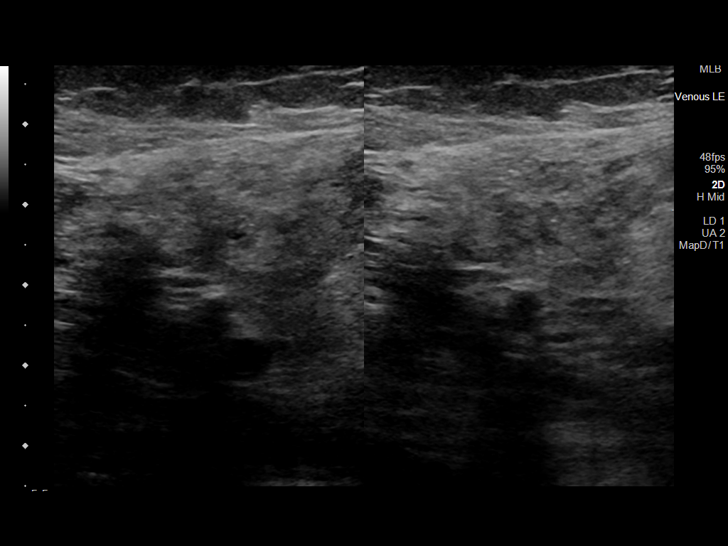
[im 29/44]
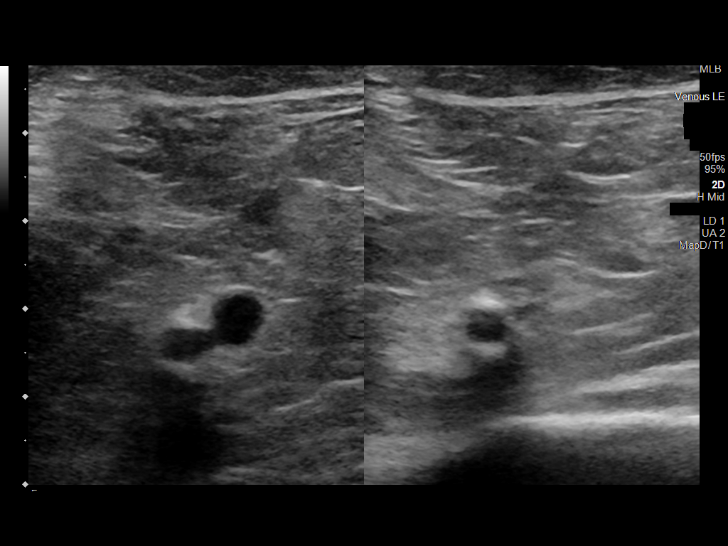
[im 32/44]
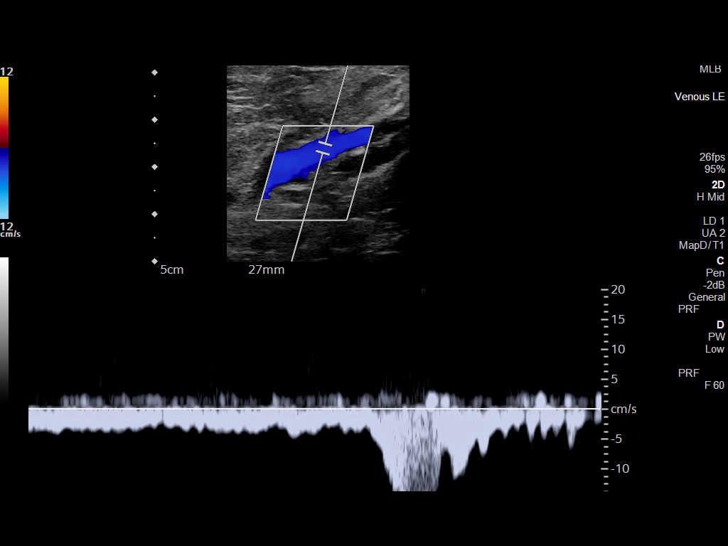
[im 36/44]
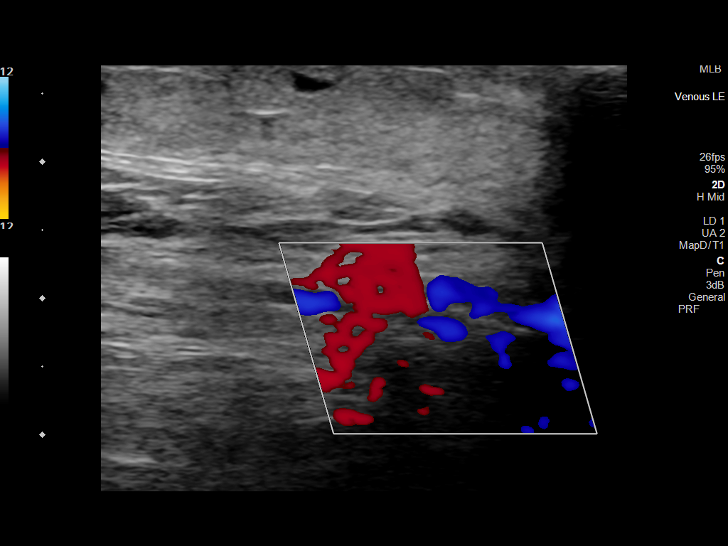
[im 40/44]
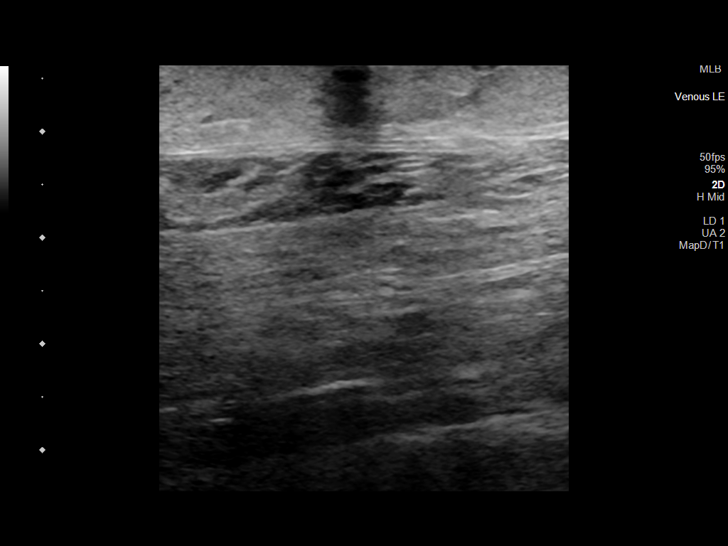
[im 44/44]
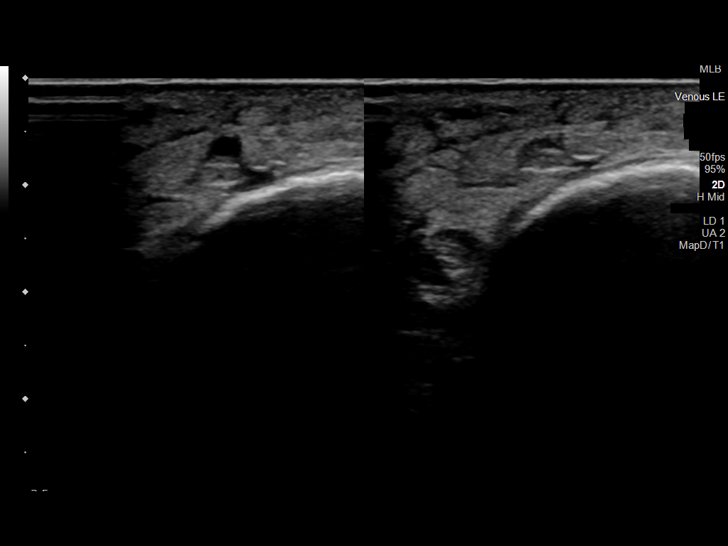

[13 of 24 positions shown; findings below may reference images not displayed]

FINDINGS: Contralateral Common Femoral Vein: Respiratory phasicity is normal
and symmetric with the symptomatic side. No evidence of thrombus.
Normal compressibility.

Common Femoral Vein: No evidence of thrombus. Normal
compressibility, respiratory phasicity and response to augmentation.

Saphenofemoral Junction: No evidence of thrombus. Normal
compressibility and flow on color Doppler imaging.

Profunda Femoral Vein: No evidence of thrombus. Normal
compressibility and flow on color Doppler imaging.

Femoral Vein: No evidence of thrombus. Normal compressibility,
respiratory phasicity and response to augmentation.

Popliteal Vein: No evidence of thrombus. Normal compressibility,
respiratory phasicity and response to augmentation.

Calf Veins: No evidence of thrombus. Normal compressibility and flow
on color Doppler imaging.

Superficial Great Saphenous Vein: No evidence of thrombus. No
further evidence of left GSV thrombophlebitis. Normal
compressibility.

Venous Reflux:  None.

Other Findings: No evidence of superficial thrombophlebitis or
abnormal fluid collection.
IMPRESSION: No evidence of left lower extremity deep venous thrombosis.

## 2021-06-30 MED ORDER — CEPHALEXIN 500 MG PO CAPS: 500.0000 mg | ORAL_CAPSULE | Freq: Two times a day (BID) | ORAL | 0 refills | Status: AC

## 2021-06-30 MED ORDER — LIDOCAINE 5 % EX OINT: 1.0000 "application " | TOPICAL_OINTMENT | CUTANEOUS | 0 refills | Status: DC | PRN

## 2021-06-30 NOTE — ED Notes (Signed)
Patient transported to Radiology 

## 2021-06-30 NOTE — Discharge Instructions (Signed)
He was seen in the emergency room today with left hip pain.  Do not find any fracture or dislocation.  You have a lot of arthritis changes and I have listed the name of an orthopedic doctor to evaluate and help treat your symptoms.  You may be developing a urinary tract infection as well.  I am calling in antibiotics we are sending a urine for culture and if you need to change antibiotics we will give you a call.  Please use your support devices at home including your wheelchair if needed.  Return with any new or suddenly worsening symptoms.

## 2021-06-30 NOTE — ED Notes (Signed)
MD at Bedside.

## 2021-06-30 NOTE — ED Triage Notes (Signed)
Patient here POV from Home with Family for Leg Pain.  Pain is to Upper Left Leg.   Pain has been present for approximately a few weeks. Patient does not recall Acute Trauma or Injury but states due to Pain/Weakness she fell last PM and may have aggravated it further.  Patient uncomfortable during Triage. A&Ox4. GCS 15. BIB Wheelchair.

## 2021-06-30 NOTE — ED Notes (Signed)
Patient returned from Radiology. 

## 2021-06-30 NOTE — ED Provider Notes (Signed)
Emergency Department Provider Note   I have reviewed the triage vital signs and the nursing notes.   HISTORY  Chief Complaint Leg Pain   HPI Heather Garrett is a 70 y.o. female with past medical history reviewed below presents to the emergency department for evaluation of pain in the left groin region.  No obvious injury.  Pain has been present for several weeks though remains persistent.  No clear diagnosis has been made as of yet.  Denies any swelling in the leg other than what is there at baseline.  States that she has been told about a superficial blood clot in that leg but nothing deep.  She is not on anticoagulation.  She is not having pain into her abdomen.  She is noting some diarrhea in the past several days but states this also a more chronic issue for her.  She has been taking Imodium.  No fevers.  No urinary tract infection symptoms but does note a history of prior infections.  She ambulates with a Rollator at baseline but does have a wheelchair to use as needed.  Denies any back pain.  No numbness or weakness in the leg.  She fell last night but denies any head injury.  Notes the pain is not worse since the fall.   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[place]) 05/17/2021   Hypertensive disorder 05/17/2021   Lower extremity edema 05/14/2021   Benign paroxysmal positional vertigo of left ear 03/23/2021   Daytime somnolence 03/23/2021   History of sleep apnea 03/23/2021   Orthostatic hypotension 03/23/2021   Migraine  without aura and without status migrainosus, not intractable 03/23/2021   Coronary arteriosclerosis in native artery 09/28/2016   Elevated LFTs 09/28/2016   Rotator cuff syndrome 09/28/2016   Biceps tendinitis 09/28/2016   Cobalamin deficiency 09/28/2016   Hyperlipidemia 09/28/2016   Migraine 09/28/2016   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[place]) 09/28/2016   Vitamin D deficiency 09/28/2016    Past Surgical History:  Procedure Laterality Date   BREAST SURGERY     CARDIAC CATHETERIZATION     CATARACT EXTRACTION     CHOLECYSTECTOMY  1979   DG  BONE DENSITY ([name] HX)     ENDOMETRIAL ABLATION     GASTRIC BYPASS     MASTECTOMY     OTHER SURGICAL HISTORY     CANCER SURGERY    Allergies Contrast media [iodinated diagnostic agents], Ciprofloxacin, Erythromycin base, and Sulfabenzamide  Family History  Problem Relation Age of Onset   Migraines Neg Hx     Social History Social History   Tobacco Use   Smoking status: Former    Passive exposure: Never   Smokeless tobacco: Never   Tobacco comments:    Quit 30 yrs ago  Vaping Use   Vaping Use: Never used  Substance Use Topics   Alcohol use: Not Currently    Alcohol/week: 14.0 standard drinks    Types: 14 Glasses of wine per  week   Drug use: Never    Review of Systems  Constitutional: No fever/chills Eyes: No visual changes. ENT: No sore throat. Cardiovascular: Denies chest pain. Respiratory: Denies shortness of breath. Gastrointestinal: No abdominal pain.  No nausea, no vomiting. Positive diarrhea.  No constipation. Genitourinary: Negative for dysuria. Musculoskeletal: Negative for back pain. Positive left hip pain.  Skin: Negative for rash. Neurological: Negative for headaches, focal weakness or numbness.  10-point ROS otherwise negative.  ____________________________________________   PHYSICAL EXAM:  VITAL SIGNS: ED Triage Vitals  Enc Vitals Group     BP 06/30/21 1120 105/69     Pulse Rate 06/30/21  1120 83     Resp 06/30/21 1120 20     Temp 06/30/21 1120 98.2 F (36.8 C)     Temp Source 06/30/21 1120 Oral     SpO2 06/30/21 1120 98 %     Weight 06/30/21 1118 105 lb 6.1 oz (47.8 kg)     Height 06/30/21 1118 4\' 8"  (1.422 m)   Constitutional: Alert and oriented. Well appearing and in no acute distress. Eyes: Conjunctivae are normal.  Head: Atraumatic. Nose: No congestion/rhinnorhea. Mouth/Throat: Mucous membranes are moist.   Neck: No stridor.   Cardiovascular: Normal rate, regular rhythm. Good peripheral circulation. Grossly normal heart sounds.   Respiratory: Normal respiratory effort.  No retractions. Lungs CTAB. Gastrointestinal: Soft and nontender. No distention.  Musculoskeletal: No lower extremity tenderness nor edema. No gross deformities of extremities.  Pain with extension of the hip but patient will tolerate isolated internal and external rotation.  Some tenderness along the inguinal crease.  No palpable hernia.  Neurologic:  Normal speech and language. No gross focal neurologic deficits are appreciated.  Skin:  Skin is warm, dry and intact. No rash noted.  ____________________________________________   LABS (all labs ordered are listed, but only abnormal results are displayed)  Labs Reviewed  URINALYSIS, ROUTINE W REFLEX MICROSCOPIC - Abnormal; Notable for the following components:      Result Value   APPearance HAZY (*)    Protein, ur TRACE (*)    Leukocytes,Ua LARGE (*)    WBC, UA >50 (*)    Bacteria, UA FEW (*)    All other components within normal limits  URINE CULTURE   ____________________________________________  RADIOLOGY  US Venous Img Lower Unilateral Left  Result Date: 06/30/2021 CLINICAL DATA:  Left lower extremity pain.  Recent fall. EXAM: LEFT LOWER EXTREMITY VENOUS DOPPLER ULTRASOUND TECHNIQUE: Gray-scale sonography with graded compression, as well as color Doppler and duplex ultrasound were performed to evaluate the lower extremity deep  venous systems from the level of the common femoral vein and including the common femoral, femoral, profunda femoral, popliteal and calf veins including the posterior tibial, peroneal and gastrocnemius veins when visible. The superficial great saphenous vein was also interrogated. Spectral Doppler was utilized to evaluate flow at rest and with distal augmentation maneuvers in the common femoral, femoral and popliteal veins. COMPARISON:  Prior bilateral venous duplex study on 05/13/2021 FINDINGS: Contralateral Common Femoral Vein: Respiratory phasicity is normal and symmetric with the symptomatic side. No evidence of thrombus. Normal compressibility. Common Femoral Vein: No evidence of thrombus. Normal compressibility, respiratory phasicity and response to augmentation. Saphenofemoral Junction: No evidence of thrombus. Normal compressibility and flow on color Doppler imaging. Profunda Femoral Vein: No evidence of thrombus. Normal compressibility and flow on color Doppler imaging. Femoral Vein: No evidence of thrombus. Normal compressibility, respiratory phasicity and response to augmentation. Popliteal Vein: No evidence of thrombus. Normal compressibility,  respiratory phasicity and response to augmentation. Calf Veins: No evidence of thrombus. Normal compressibility and flow on color Doppler imaging. Superficial Great Saphenous Vein: No evidence of thrombus. No further evidence of left GSV thrombophlebitis. Normal compressibility. Venous Reflux:  None. Other Findings: No evidence of superficial thrombophlebitis or abnormal fluid collection. IMPRESSION: No evidence of left lower extremity deep venous thrombosis. Electronically Signed   By: Aletta Edouard M.D.   On: 06/30/2021 12:39   DG Hip Unilat W or Wo Pelvis 2-3 Views Left  Result Date: 06/30/2021 CLINICAL DATA:  Left hip pain EXAM: DG HIP (WITH OR WITHOUT PELVIS) 2-3V LEFT COMPARISON:  None. FINDINGS: Bones of the pelvis appear normal. Right hip appears  normal. There is flattening and sclerosis of the femoral head on the left consistent with sequela of avascular necrosis. No sign of acute femoral neck fracture or intertrochanteric fracture. Regional arterial calcification incidentally noted. IMPRESSION: Flattening and sclerotic change of the left femoral head consistent with sequela of avascular necrosis. Cannot rule out a recent subchondral collapse. No fracture of the femoral neck or intertrochanteric region. Electronically Signed   By: Nelson Chimes M.D.   On: 06/30/2021 11:53    ____________________________________________   PROCEDURES  Procedure(s) performed:   Procedures  None  ____________________________________________   INITIAL IMPRESSION / ASSESSMENT AND PLAN / ED COURSE  Pertinent labs & imaging results that were available during my care of the patient were reviewed by me and considered in my medical decision making (see chart for details).   Patient presents emergency department left hip pain.  This seems musculoskeletal and isolated mainly in the inguinal crease.  I not appreciate any hernias.  The leg is neurovascularly intact.  She does have some baseline swelling which is not worse.  We will plan for ultrasound to evaluate for DVT and obtain plain film of the left hip although lower suspicion for fracture or dislocation clinically.  Seems like a musculoskeletal strain.  Patient does have some diarrhea here but states this is not uncommon for her.  She is managing with Imodium and appears well-hydrated.   Patient's UA resulting showing leukocytes with bacteria and WBCs.  Will send for urine culture but patient is having some symptoms.  Plan for coverage with Keflex and will follow culture results.  There is some flattening and sclerotic change in the left femoral head.  We discussed these findings and will plan for referral to orthopedics.  Gave patient contact information and advised that she should call for an appointment.  No  evidence of DVT on ultrasound.  Patient has assist devices at home including Rollator and wheelchair.  Plan for lidocaine ointment and will discharge with plan for PCP and Ortho follow-up.  ____________________________________________  FINAL CLINICAL IMPRESSION(S) / ED DIAGNOSES  Final diagnoses:  Left hip pain  Acute cystitis without hematuria      NEW OUTPATIENT MEDICATIONS STARTED DURING THIS VISIT:  Discharge Medication List as of 06/30/2021  1:04 PM     START taking these medications   Details  cephALEXin (KEFLEX) 500 MG capsule Take 1 capsule (500 mg total) by mouth 2 (two) times daily for 7 days., Starting Wed 06/30/2021, Until Wed 07/07/2021, Normal    lidocaine (XYLOCAINE) 5 % ointment Apply 1 application topically as needed., Starting Wed 06/30/2021, Normal        Note:  This document was prepared using Dragon voice recognition software and may include unintentional dictation errors.  Nanda Quinton, MD, [facility]    Machaela Caterino,  [facility], MD 06/30/21 1340

## 2021-06-30 NOTE — Progress Notes (Signed)
Heather Garrett,  All the labs looking at other causes of chronic liver disease were unremarkable, indicating that alcohol is likely the sole cause of your liver problems.  Your liver enzymes have increased, indicating worsening inflammation/damage to the liver.  I would strongly urge you to completely stop drinking ASAP  to reduce the risk of developing more severe liver problems such as alcoholic hepatitis or cirrhosis.  Please follow up with your counselor and do what you can to stop drinking.

## 2021-07-02 LAB — URINE CULTURE: Culture: >=100000 — AB

## 2021-07-03 ENCOUNTER — Telehealth: Payer: Self-pay | Admitting: Emergency Medicine

## 2021-07-03 NOTE — Telephone Encounter (Signed)
Post ED Visit - Positive Culture Follow-up  Culture report reviewed by antimicrobial stewardship pharmacist: [place] Team []  Elenor Quinones, Pharm.D. []  Heide Guile, Pharm.D., BCPS AQ-ID []  Parks Neptune, Pharm.D., BCPS []  Alycia Rossetti, Pharm.D., BCPS []  [place], [place].D., BCPS, AAHIVP []  Legrand Como, Pharm.D., BCPS, AAHIVP []  Salome Arnt, PharmD, BCPS []  Johnnette Gourd, PharmD, BCPS []  Hughes Better, PharmD, BCPS [x]  Laurey Arrow, PharmD []  Laqueta Linden, PharmD, BCPS []  Albertina Parr, PharmD  [place] Team []  Leodis Sias, PharmD []  Lindell Spar, PharmD []  Royetta Asal, PharmD []  Graylin Shiver, Rph []  Rema Fendt) Glennon Mac, PharmD []  Arlyn Dunning, PharmD []  Netta Cedars, PharmD []  Dia Sitter, PharmD []  Leone Haven, PharmD []  Gretta Arab, PharmD []  Theodis Shove, PharmD []  Peggyann Juba, PharmD []  Reuel Boom, PharmD   Positive urine culture Treated with Cephalexin, organism sensitive to the same and no further patient follow-up is required at this time.  Sandi Raveling Clemencia Helzer 07/03/2021, 3:10 PM

## 2021-07-09 ENCOUNTER — Ambulatory Visit (HOSPITAL_COMMUNITY): Admission: RE | Admit: 2021-07-09 | Payer: Medicare PPO | Source: Ambulatory Visit

## 2021-07-09 ENCOUNTER — Encounter (HOSPITAL_COMMUNITY): Payer: Self-pay

## 2021-07-22 ENCOUNTER — Telehealth: Payer: Self-pay | Admitting: *Deleted

## 2021-07-22 NOTE — Telephone Encounter (Signed)
° °  Pre-operative Risk Assessment    Patient Name: Heather Garrett  DOB: 12/24/1950 MRN: [id number]      Request for Surgical Clearance    Procedure:   LEFT TOTAL HIP REPLACEMENT   Date of Surgery:  Clearance TBD                                 Surgeon:  DR. DANIEL MARCHWIANY Surgeon's Group or Practice Name:  [facility] Phone number:  [phone] Fax number:  [phone] ATTN: KELLY EXT [id number]   Type of Clearance Requested:   - Medical  - Pharmacy:  Hold Aspirin     Type of Anesthesia:   CHOICE   Additional requests/questions:    Jiles Prows   07/22/2021, 3:50 PM

## 2021-07-23 NOTE — Telephone Encounter (Signed)
Routed to requesting surgeon's office to make them aware of sooner appointment for Surgical Clearance.

## 2021-07-23 NOTE — Telephone Encounter (Signed)
I spoke with the patient to check the urgency of her surgery since she is scheduled to see Dr. Johney Frame in a month. She says she does have quite significant pain from the hip and would like a earlier appt to see Dr. Arty Baumgartner.   Her February appt has been moved up to 1/27 at 8:40AM for preop clearance and evaluation of leg edema.  Will defer final clearance to MD. Please let the requesting provider's office know about the appt for clearance.

## 2021-08-02 NOTE — Progress Notes (Deleted)
Cardiology Office Note:    Date:  08/02/2021   ID:  Heather Garrett, DOB 08-Sep-1950, MRN [id number]  PCP:  [name], MD   [facility] Providers Cardiologist:  [name], MD Cardiology APP:  [name]     Referring MD: [name], MD   No chief complaint on file.   History of Present Illness:    Heather Garrett is a 71 y.o. female with a hx of CAD with prior MI s/p BMS to LAD in 2001, HFpEF, DMII, HTN, HLD, OSA, tobacco abuse, history of breast cancer and history of renal cell carcinoma who presents to clinic for follow-up.  Patient has a known history of MI in 2001 requiring BMS to LAD. Myoview in 2020 negative for ischemia. Last TTE from OSH 09/2020 with EF 50-55, Gr 1 DD, mod LAE, AV sclerosis w/o AS, MAC w trace MR, PASP 33. She initially saw me on 11/2020 for worsening LE edema. We increased lasix at that time with resolution of symptoms.   She was last seen in clinic on 05/2021 with worsening LE edema by Dr. Marlou Porch. Ultrasound showed superficial thrombophlebitis but no DVT. She was referred to vascular at that time and they recommended compression stockings. She delcined reflux study.  Today, she is being seen for pre-operative evaluation prior to hip surgery.  Past Medical History:  Diagnosis Date   Biceps tendinitis    Cobalamin deficiency    Coronary arteriosclerosis in native artery    Elevated liver function tests    Hyperlipidemia    Migraine    Rotator cuff syndrome    Transitional cell carcinoma, bladder (HCC)    Vitamin D deficiency     Past Surgical History:  Procedure Laterality Date   BREAST SURGERY     CARDIAC CATHETERIZATION     CATARACT EXTRACTION     CHOLECYSTECTOMY  1979   DG  BONE DENSITY ([place] HX)     ENDOMETRIAL ABLATION     GASTRIC BYPASS     MASTECTOMY     OTHER SURGICAL HISTORY     CANCER SURGERY    Current Medications: No outpatient medications have been marked as taking for the 08/06/21  encounter (Appointment) with [name], MD.     Allergies:   Contrast media [iodinated contrast media], Ciprofloxacin, Erythromycin base, and Sulfabenzamide   Social History   Socioeconomic History   Marital status: Married    Spouse name: Not on file   Number of children: Not on file   Years of education: Not on file   Highest education level: Not on file  Occupational History   Not on file  Tobacco Use   Smoking status: Former    Passive exposure: Never   Smokeless tobacco: Never   Tobacco comments:    Quit 30 yrs ago  Vaping Use   Vaping Use: Never used  Substance and Sexual Activity   Alcohol use: Not Currently    Alcohol/week: 14.0 standard drinks    Types: 14 Glasses of wine per week   Drug use: Never   Sexual activity: Not on file  Other Topics Concern   Not on file  Social History Narrative   Not on file   Social Determinants of Health   Financial Resource Strain: Not on file  Food Insecurity: Not on file  Transportation Needs: Not on file  Physical Activity: Not on file  Stress: Not on file  Social Connections: Not on file  Family History: The patient's ***family history is negative for Migraines.  ROS:   Please see the history of present illness.    *** All other systems reviewed and are negative.  EKGs/Labs/Other Studies Reviewed:    The following studies were reviewed today: No evidence of deep venous thrombosis in either lower extremity. However, probable superficial thrombophlebitis involving left superficial greater saphenous vein.   Mesenteric Vascular US 02/22/2021: Summary:  Largest Aortic Diameter: Aortoiliac atherosclerosis with largest  measurements at  the mid segment, measuring 1.8 cm.     Mesenteric:  Normal Celiac artery , Superior Mesenteric artery, Inferior Mesenteric  artery,  Splenic artery and Hepatic artery findings.    CT Chest 01/02/2021 FINDINGS: Cardiovascular: Extensive multi-vessel coronary  artery calcification. Global cardiac size within normal limits. No pericardial effusion. The central pulmonary arteries are mildly enlarged in keeping with changes of pulmonary arterial hypertension. Moderate atherosclerotic calcification within the thoracic aorta. No aortic aneurysm.   Mediastinum/Nodes: The visualized thyroid is unremarkable. No pathologic thoracic adenopathy. 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 The esophagus is unremarkable.   Lungs/Pleura: A 4 mm ground-glass pulmonary nodule is seen within the superior segment of the left lower lobe, axial image # 42/5. 3 mm nodule within the a medial right lung base, axial image # 96, demonstrates a benign calcification pattern and is compatible with a tiny granuloma. Mild subpleural reticulation is seen within the right lower lobe with parenchymal banding most in keeping with mild fibrotic change. No other focal pulmonary nodules or infiltrates. No pneumothorax or pleural effusion. The central airways are widely patent.   Upper Abdomen: At least moderate hepatic steatosis. Cholecystectomy has been performed. Partial resection of the upper pole the right kidney noted. No acute abnormality.   Musculoskeletal: No lytic or blastic bone lesion. No acute bone abnormality.   IMPRESSION: Extensive multi-vessel coronary artery calcification.   Pulmonary arterial enlargement in keeping with pulmonary artery hypertension.   4 mm ground-glass pulmonary nodule within the left lower lobe. No follow-up recommended. This recommendation follows the consensus statement: Guidelines for Management of Incidental Pulmonary Nodules Detected on CT Images: From the Fleischner Society 2017; Radiology 2017; 284:228-243.   Moderate hepatic steatosis.   Aortic Atherosclerosis (ICD10-I70.0).   LE Venous DVT 11/19/2020: Summary:  RIGHT:  - No evidence  of common femoral vein obstruction.     LEFT:  - There is no evidence of deep vein thrombosis in the lower extremity.   Echo 09/24/2020: Impression: 1. Suboptimal study due to poor acoustic windows. 2. LV wall thickness is normal with normal left ventricular contractility and an overall ejection fraction of 50-55%. 3. Grade 1 diastolic dysfunction. E/e' measured 8.35. 4. Moderate left atrial enlargement. 5. Normal right atrial measurements. 6. Aortic valve is sclerotic but not stenotic. 7. Mitral valve is normal in mobility and thickness. Mild MAC with trace MR. 8. Tricuspid valve is well visualized and normal. Mild TR with a PA pressure of 33 mmHg.  EKG:  EKG is *** ordered today.  The ekg ordered today demonstrates ***  Recent Labs: 11/25/2020: NT-Pro BNP 1,702 01/26/2021: Hemoglobin 11.9; Platelets 203 06/16/2021: ALT 90; BUN 21; Creatinine, Ser 0.82; Potassium 4.6; Sodium 139  Recent Lipid Panel No results found for: CHOL, TRIG, HDL, CHOLHDL, VLDL, LDLCALC, LDLDIRECT   Risk Assessment/Calculations:   {Does this patient have ATRIAL FIBRILLATION?[phone]}       Physical Exam:    VS:  There were no  vitals taken for this visit.    Wt Readings from Last 3 Encounters:  06/30/21 105 lb 6.1 oz (47.8 kg)  06/16/21 105 lb 6.4 oz (47.8 kg)  05/27/21 101 lb (45.8 kg)     GEN: *** Well nourished, well developed in no acute distress HEENT: Normal NECK: No JVD; No carotid bruits LYMPHATICS: No lymphadenopathy CARDIAC: ***RRR, no murmurs, rubs, gallops RESPIRATORY:  Clear to auscultation without rales, wheezing or rhonchi  ABDOMEN: Soft, non-tender, non-distended MUSCULOSKELETAL:  No edema; No deformity  SKIN: Warm and dry NEUROLOGIC:  Alert and oriented x 3 PSYCHIATRIC:  Normal affect   ASSESSMENT:    No diagnosis found. PLAN:    In order of problems listed above:  #Known CAD s/p remote MI with PCI in 2001: Has history of MI in 2001 s/p PCI to LAD. Myoview from 2020  without ischemia. TTE 09/2018 with EF 50-55%, G1DD, trivial MR, mild TR, PASP 33. -Obtain records from Cardiologist; reportedly had TTE 09/2020 -Continue ASA 27m daily -Continue coreg 3.1265mBID -Continue vytorin 10-80mg  -Will add back ACE/ARB as able pending renal function and blood pressure   #LE edema: TTE with normal EF 50-55%, G1DD. Has chronic R>L LE edema. Ultrsound negative for DVT but positive for superficial thrombophlebitis. Now recommended for compression stockings -Compression socks, leg elevation -Low Na diet   #HTN: Elevated today. Having episodes of dizziness however (? Orthostasis). States mainly 120-140s on home cuff. -Continue coreg 3.1257mID -Check home BP cuff measurements and add ACE/ARB if able; goal <120s/80s -Monitor for symptoms of orthostasis   #HLD: -Continue vytorin 10-80mg daily as above      {Are you ordering a CV Procedure (e.g. stress test, cath, DCCV, TEE, etc)?   Press F2        :[id number]} Medication Adjustments/Labs and Tests Ordered: Current medicines are reviewed at length with the patient today.  Concerns regarding medicines are outlined above.  No orders of the defined types were placed in this encounter.  No orders of the defined types were placed in this encounter.   There are no Patient Instructions on file for this visit.   Signed, HeaFreada BergeronD  08/02/2021 3:26 PM    [place]

## 2021-08-06 ENCOUNTER — Other Ambulatory Visit: Payer: Self-pay

## 2021-08-06 ENCOUNTER — Ambulatory Visit: Payer: Medicare PPO | Admitting: Cardiology

## 2021-08-06 ENCOUNTER — Encounter (HOSPITAL_COMMUNITY): Payer: Self-pay | Admitting: *Deleted

## 2021-08-06 ENCOUNTER — Telehealth (HOSPITAL_COMMUNITY): Payer: Self-pay | Admitting: *Deleted

## 2021-08-06 ENCOUNTER — Encounter: Payer: Self-pay | Admitting: Cardiology

## 2021-08-06 ENCOUNTER — Encounter: Payer: Self-pay | Admitting: *Deleted

## 2021-08-06 VITALS — BP 128/68 | HR 67 | Ht <= 58 in | Wt 110.6 lb

## 2021-08-06 DIAGNOSIS — I2510 Atherosclerotic heart disease of native coronary artery without angina pectoris: Secondary | ICD-10-CM

## 2021-08-06 DIAGNOSIS — E782 Mixed hyperlipidemia: Secondary | ICD-10-CM | POA: Diagnosis not present

## 2021-08-06 DIAGNOSIS — I5032 Chronic diastolic (congestive) heart failure: Secondary | ICD-10-CM

## 2021-08-06 DIAGNOSIS — Z01810 Encounter for preprocedural cardiovascular examination: Secondary | ICD-10-CM

## 2021-08-06 DIAGNOSIS — R600 Localized edema: Secondary | ICD-10-CM

## 2021-08-06 DIAGNOSIS — I10 Essential (primary) hypertension: Secondary | ICD-10-CM

## 2021-08-06 DIAGNOSIS — Z01818 Encounter for other preprocedural examination: Secondary | ICD-10-CM | POA: Diagnosis not present

## 2021-08-06 NOTE — Patient Instructions (Signed)
Medication Instructions:   Your physician recommends that you continue on your current medications as directed. Please refer to the Current Medication list given to you today.  *If you need a refill on your cardiac medications before your next appointment, please call your pharmacy*   Testing/Procedures:  Your physician has requested that you have a lexiscan myoview. For further information please visit [email or web address]. Please follow instruction sheet, as given.   Follow-Up: At [facility], you and your health needs are our priority.  As part of our continuing mission to provide you with exceptional heart care, we have created designated Provider Care Teams.  These Care Teams include your primary Cardiologist (physician) and Advanced Practice Providers (APPs -  Physician Assistants and Nurse Practitioners) who all work together to provide you with the care you need, when you need it.  We recommend signing up for the patient portal called "MyChart".  Sign up information is provided on this After Visit Summary.  MyChart is used to connect with patients for Virtual Visits (Telemedicine).  Patients are able to view lab/test results, encounter notes, upcoming appointments, etc.  Non-urgent messages can be sent to your provider as well.   To learn more about what you can do with MyChart, go to [email or web address].    Your next appointment:   6 month(s)  The format for your next appointment:   In Person  Provider:   DR. Johney Frame

## 2021-08-06 NOTE — Progress Notes (Signed)
Cardiology Office Note:    Date:  08/06/2021   ID:  Heather Garrett, DOB 08-17-1950, MRN [id number]  PCP:  [name], MD   [facility] Providers Cardiologist:  [name], MD Cardiology APP:  [name]      Referring MD: [name], MD   No chief complaint on file.   History of Present Illness:    Heather Garrett is a 71 y.o. female with a hx of CAD with prior MI s/p BMS to LAD in 2001, HFpEF, DMII, HTN, HLD, OSA, tobacco abuse, history of breast cancer and history of renal cell carcinoma who presents to clinic for follow-up.  Patient has a known history of MI in 2001 requiring BMS to LAD. Myoview in 2020 negative for ischemia. Last TTE from OSH 09/2020 with EF 50-55, Gr 1 DD, mod LAE, AV sclerosis w/o AS, MAC w trace MR, PASP 33. She initially saw me on 11/2020 for worsening LE edema. We increased lasix at that time with resolution of symptoms.   She was last seen in clinic on 05/2021 with worsening LE edema by Dr. Marlou Porch. Ultrasound showed superficial thrombophlebitis but no DVT. She was referred to vascular at that time and they recommended compression stockings. She delcined reflux study.  Today, she is being seen for pre-operative evaluation prior to hip surgery.  She does not have a date for the procedure until she receives clearance. Her hip prevents her from walking because of the pain and bilateral leg weakness. She is can only really make it around her house before she has to sit down due to joint pain. No exertional chest pain, SOB, palpitations or lightheadedness.   Otherwise, she is doing well from a CV standpoint. Continues to have intermittent LE edema for which she taked HCTZ. Has used compression socks as needed as well. She has been compliant with medications and blood pressure is well controlled at home.  The patient denies chest pain, chest pressure, dyspnea at rest or with exertion, palpitations, PND, or orthopnea. Denies  cough, fever, chills. Denies nausea, vomiting. Denies syncope or presyncope. Denies dizziness or lightheadedness. Denies snoring.  Past Medical History:  Diagnosis Date   Biceps tendinitis    Cobalamin deficiency    Coronary arteriosclerosis in native artery    Elevated liver function tests    Hyperlipidemia    Migraine    Rotator cuff syndrome    Transitional cell carcinoma, bladder (HCC)    Vitamin D deficiency     Past Surgical History:  Procedure Laterality Date   BREAST SURGERY     CARDIAC CATHETERIZATION     CATARACT EXTRACTION     CHOLECYSTECTOMY  1979   DG  BONE DENSITY ([place] HX)     ENDOMETRIAL ABLATION     GASTRIC BYPASS     MASTECTOMY     OTHER SURGICAL HISTORY     CANCER SURGERY    Current Medications: Current Meds  Medication Sig   acetaminophen (TYLENOL) 500 MG tablet Take 1,000 mg by mouth every 6 (six) hours as needed for mild pain.   aspirin 81 MG EC tablet Take 81 mg by mouth every other day. Swallow whole.   carvedilol (COREG) 3.125 MG tablet Take 1 tablet (3.125 mg total) by mouth 2 (two) times daily.   ezetimibe-simvastatin (VYTORIN) 10-80 MG tablet Take 1 tablet by mouth daily.   famotidine (PEPCID) 40 MG tablet Take 40 mg by mouth daily.   hydrochlorothiazide (HYDRODIURIL) 25 MG tablet  Take 25 mg by mouth daily as needed.   lidocaine (XYLOCAINE) 5 % ointment Apply 1 application topically as needed.   NURTEC 75 MG TBDP Take 1 tablet by mouth daily as needed for headache.   sertraline (ZOLOFT) 100 MG tablet Take 100 mg by mouth daily.   tiZANidine (ZANAFLEX) 4 MG capsule Take 4 mg by mouth at bedtime.   valsartan (DIOVAN) 320 MG tablet Take 320 mg by mouth daily.   VITAMIN D PO Take 1 tablet by mouth daily.     Allergies:   Contrast media [iodinated contrast media], Ciprofloxacin, Erythromycin base, and Sulfabenzamide   Social History   Socioeconomic History   Marital status: Married    Spouse name: Not on file   Number of children: Not on  file   Years of education: Not on file   Highest education level: Not on file  Occupational History   Not on file  Tobacco Use   Smoking status: Former    Passive exposure: Never   Smokeless tobacco: Never   Tobacco comments:    Quit 30 yrs ago  Vaping Use   Vaping Use: Never used  Substance and Sexual Activity   Alcohol use: Not Currently    Alcohol/week: 14.0 standard drinks    Types: 14 Glasses of wine per week   Drug use: Never   Sexual activity: Not on file  Other Topics Concern   Not on file  Social History Narrative   Not on file   Social Determinants of Health   Financial Resource Strain: Not on file  Food Insecurity: Not on file  Transportation Needs: Not on file  Physical Activity: Not on file  Stress: Not on file  Social Connections: Not on file     Family History: The patient's family history is negative for Migraines.  ROS:   Please see the history of present illness.    Review of Systems  Constitutional:  Negative for chills, fever and malaise/fatigue.  HENT:  Negative for congestion and nosebleeds.   Eyes:  Negative for blurred vision and photophobia.  Respiratory:  Negative for cough and shortness of breath.   Cardiovascular:  Positive for leg swelling. Negative for chest pain, palpitations, orthopnea, claudication and PND.  Gastrointestinal:  Negative for heartburn and nausea.  Genitourinary:  Negative for dysuria.  Musculoskeletal:  Positive for joint pain (Bilateral hips). Negative for myalgias.  Skin:  Negative for itching and rash.  Neurological:  Positive for weakness. Negative for dizziness and headaches.  Endo/Heme/Allergies:  Does not bruise/bleed easily.  Psychiatric/Behavioral:  The patient is not nervous/anxious and does not have insomnia.   All other systems reviewed and are negative.  EKGs/Labs/Other Studies Reviewed:    The following studies were reviewed today: No evidence of deep venous thrombosis in either lower  extremity. However, probable superficial thrombophlebitis involving left superficial greater saphenous vein.   Mesenteric Vascular US 02/22/2021: Summary:  Largest Aortic Diameter: Aortoiliac atherosclerosis with largest  measurements at  the mid segment, measuring 1.8 cm.     Mesenteric:  Normal Celiac artery , Superior Mesenteric artery, Inferior Mesenteric  artery,  Splenic artery and Hepatic artery findings.    CT Chest 01/02/2021 FINDINGS: Cardiovascular: Extensive multi-vessel coronary artery calcification. Global cardiac size within normal limits. No pericardial effusion. The central pulmonary arteries are mildly enlarged in keeping with changes of pulmonary arterial hypertension. Moderate atherosclerotic calcification within the thoracic aorta. No aortic aneurysm.   Mediastinum/Nodes: The visualized thyroid is unremarkable. No pathologic thoracic adenopathy.  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 The esophagus is unremarkable.   Lungs/Pleura: A 4 mm ground-glass pulmonary nodule is seen within the superior segment of the left lower lobe, axial image # 42/5. 3 mm nodule within the a medial right lung base, axial image # 96, demonstrates a benign calcification pattern and is compatible with a tiny granuloma. Mild subpleural reticulation is seen within the right lower lobe with parenchymal banding most in keeping with mild fibrotic change. No other focal pulmonary nodules or infiltrates. No pneumothorax or pleural effusion. The central airways are widely patent.   Upper Abdomen: At least moderate hepatic steatosis. Cholecystectomy has been performed. Partial resection of the upper pole the right kidney noted. No acute abnormality.   Musculoskeletal: No lytic or blastic bone lesion. No acute bone abnormality.   IMPRESSION: Extensive multi-vessel coronary artery calcification.    Pulmonary arterial enlargement in keeping with pulmonary artery hypertension.   4 mm ground-glass pulmonary nodule within the left lower lobe. No follow-up recommended. This recommendation follows the consensus statement: Guidelines for Management of Incidental Pulmonary Nodules Detected on CT Images: From the Fleischner Society 2017; Radiology 2017; 284:228-243.   Moderate hepatic steatosis.   Aortic Atherosclerosis (ICD10-I70.0).   LE Venous DVT 11/19/2020: Summary:  RIGHT:  - No evidence of common femoral vein obstruction.     LEFT:  - There is no evidence of deep vein thrombosis in the lower extremity.   Echo 09/24/2020: Impression: 1. Suboptimal study due to poor acoustic windows. 2. LV wall thickness is normal with normal left ventricular contractility and an overall ejection fraction of 50-55%. 3. Grade 1 diastolic dysfunction. E/e' measured 8.35. 4. Moderate left atrial enlargement. 5. Normal right atrial measurements. 6. Aortic valve is sclerotic but not stenotic. 7. Mitral valve is normal in mobility and thickness. Mild MAC with trace MR. 8. Tricuspid valve is well visualized and normal. Mild TR with a PA pressure of 33 mmHg.  EKG:   08/06/21: Sinus rhythm, rate 67 bpm  Recent Labs: 11/25/2020: NT-Pro BNP 1,702 01/26/2021: Hemoglobin 11.9; Platelets 203 06/16/2021: ALT 90; BUN 21; Creatinine, Ser 0.82; Potassium 4.6; Sodium 139  Recent Lipid Panel No results found for: CHOL, TRIG, HDL, CHOLHDL, VLDL, LDLCALC, LDLDIRECT   Risk Assessment/Calculations:           Physical Exam:    VS:  BP 128/68    Pulse 67    Ht _0  (1.422 m)    Wt 110 lb 9.6 oz (50.2 kg)    SpO2 99%    BMI 24.80 kg/m     Wt Readings from Last 3 Encounters:  08/06/21 110 lb 9.6 oz (50.2 kg)  06/30/21 105 lb 6.1 oz (47.8 kg)  06/16/21 105 lb 6.4 oz (47.8 kg)     GEN: Well nourished, well developed in no acute distress HEENT: Normal NECK: No JVD; No carotid bruits LYMPHATICS: No  lymphadenopathy CARDIAC: RRR, 2/6 systolic murmur, no rubs or gallops RESPIRATORY:  Clear to auscultation without rales, wheezing or rhonchi  ABDOMEN: Soft, non-tender, non-distended MUSCULOSKELETAL:  No edema; No deformity  SKIN: Warm and dry NEUROLOGIC:  Alert and oriented x 3 PSYCHIATRIC:  Normal affect   ASSESSMENT:    1. Preoperative cardiovascular examination   2. Coronary artery disease involving native coronary artery of native heart without angina pectoris   3. Pre-operative clearance   4. Mixed hyperlipidemia   5. Essential hypertension   6. Lower  extremity edema   7. Chronic heart failure with preserved ejection fraction (HCC)    PLAN:    In order of problems listed above:  #Pre-operative Evaluation: Patient is planned for hip arthroplasty with ortho. She is doing well from a CV standpoint, however, joint pain is limiting her mobility and she is unable to perform >4METs. Will plan for pre-operative myoview given history of CAD. If low or intermediate risk, no further testing needed prior to surgery. -Plan for [facility] as patient unable to perform >4METs due to joint pain -If myoview low or intermediate risk, no further testing needed prior to OR -Last myoview in 2020 low risk without ischemia -TTE with EF 50-55%, G1DD, mild TR, PASP 33   #Known CAD s/p remote MI with PCI in 2001: Has history of MI in 2001 s/p PCI to LAD. Myoview from 2020 without ischemia. TTE 09/2018 with EF 50-55%, G1DD, trivial MR, mild TR, PASP 33. -Continue ASA 64m daily -Continue coreg 3.1247mBID -Continue vytorin 10-80mg  -Continue valsartan 32063maily   #LE edema: Improved. TTE with normal EF 50-55%, G1DD. Has chronic R>L LE edema. Ultrsound negative for DVT but positive for superficial thrombophlebitis. Now recommended for compression stockings. -Compression socks, leg elevation -HCTZ as needed -Low Na diet   #HTN: Well controlled and at goal at home -Continue coreg 3.125m7mID -Continue valsartan 320mg35mly -Check home BP cuff measurements and add ACE/ARB if able; goal <120s/80s -Monitor for symptoms of orthostasis   #HLD: -Continue vytorin 10-80mg daily as above      Shared Decision Making/Informed Consent The risks [chest pain, shortness of breath, cardiac arrhythmias, dizziness, blood pressure fluctuations, myocardial infarction, stroke/transient ischemic attack, nausea, vomiting, allergic reaction, radiation exposure, metallic taste sensation and life-threatening complications (estimated to be 1 in 10,000)], benefits (risk stratification, diagnosing coronary artery disease, treatment guidance) and alternatives of a nuclear stress test were discussed in detail with Heather Garrett and she agrees to proceed.    Medication Adjustments/Labs and Tests Ordered: Current medicines are reviewed at length with the patient today.  Concerns regarding medicines are outlined above.  Orders Placed This Encounter  Procedures   MYOCARDIAL PERFUSION IMAGING   EKG 12-Lead   No orders of the defined types were placed in this encounter.   Patient Instructions  Medication Instructions:   Your physician recommends that you continue on your current medications as directed. Please refer to the Current Medication list given to you today.  *If you need a refill on your cardiac medications before your next appointment, please call your pharmacy*   Testing/Procedures:  Your physician has requested that you have a lexiscan myoview. For further information please visit [email or web address] follow instruction sheet, as given.   Follow-Up: At [facility] and your health needs are our priority.  As part of our continuing mission to provide you with exceptional heart care, we have created designated Provider Care Teams.  These Care Teams include your primary Cardiologist (physician) and Advanced Practice Providers (APPs -  Physician Assistants and Nurse  Practitioners) who all work together to provide you with the care you need, when you need it.  We recommend signing up for the patient portal called "MyChart".  Sign up information is provided on this After Visit Summary.  MyChart is used to connect with patients for Virtual Visits (Telemedicine).  Patients are able to view lab/test results, encounter notes, upcoming appointments, etc.  Non-urgent messages can be sent to your provider as well.   To learn  more about what you can do with MyChart, go to [email or web address].    Your next appointment:   6 month(s)  The format for your next appointment:   In Person  Provider:   DR. Reece Leader as a scribe for [name], MD.,have documented all relevant documentation on the behalf of [name], MD,as directed by  [name], MD while in the presence of [name], MD.  I, [name], MD, have reviewed all documentation for this visit. The documentation on 08/06/21 for the exam, diagnosis, procedures, and orders are all accurate and complete.   Signed, [name], MD  08/06/2021 9:58 AM    Heather Garrett

## 2021-08-06 NOTE — Telephone Encounter (Signed)
Patient given detailed instructions per Myocardial Perfusion Study Information Sheet for the test on 08/13/2021 at 7:15. Patient notified to arrive 15 minutes early and that it is imperative to arrive on time for appointment to keep from having the test rescheduled.  If you need to cancel or reschedule your appointment, please call the office within 24 hours of your appointment. . Patient verbalized understanding.Heather Garrett

## 2021-08-13 ENCOUNTER — Ambulatory Visit (HOSPITAL_COMMUNITY): Payer: Medicare PPO | Attending: Cardiology

## 2021-08-13 ENCOUNTER — Other Ambulatory Visit: Payer: Self-pay

## 2021-08-13 DIAGNOSIS — I2510 Atherosclerotic heart disease of native coronary artery without angina pectoris: Secondary | ICD-10-CM

## 2021-08-13 DIAGNOSIS — Z01818 Encounter for other preprocedural examination: Secondary | ICD-10-CM | POA: Insufficient documentation

## 2021-08-13 LAB — MYOCARDIAL PERFUSION IMAGING
LV dias vol: 46 mL (ref 46–106)
LV sys vol: 16 mL
Nuc Stress EF: 66 %
Peak HR: 115 {beats}/min
Rest HR: 87 {beats}/min
Rest Nuclear Isotope Dose: 10.3 mCi
SDS: 2
SRS: 2
SSS: 4
ST Depression (mm): 0 mm
Stress Nuclear Isotope Dose: 31.5 mCi
TID: 0.92

## 2021-08-13 MED ORDER — TECHNETIUM TC 99M TETROFOSMIN IV KIT
10.3000 | PACK | Freq: Once | INTRAVENOUS | Status: AC | PRN
  Administered 2021-08-13: 10.3 via INTRAVENOUS
  Filled 2021-08-13: qty 11

## 2021-08-13 MED ORDER — REGADENOSON 0.4 MG/5ML IV SOLN
0.4000 mg | Freq: Once | INTRAVENOUS | Status: AC
  Administered 2021-08-13: 0.4 mg via INTRAVENOUS

## 2021-08-13 MED ORDER — TECHNETIUM TC 99M TETROFOSMIN IV KIT
31.5000 | PACK | Freq: Once | INTRAVENOUS | Status: AC | PRN
  Administered 2021-08-13: 31.5 via INTRAVENOUS
  Filled 2021-08-13: qty 32

## 2021-08-16 NOTE — Telephone Encounter (Signed)
° ° °  Patient Name: Heather Garrett  DOB: Dec 09, 1950 MRN: [id number]  Primary Cardiologist: [name], MD  Chart reviewed as part of pre-operative protocol coverage. Patient was seen by Dr. Johney Frame on 08/06/2021 for pre-op evaluation for upcoming hip surgery at which time a nuclear stress test was ordered for further risk stratification given patient was unable to perform >4 METS. Stress shoed evidence of scar in the region of prior infarct but no evidence of ischemia. Per Dr. Johney Frame, "no further testing needed prior to her surgery." Regarding Aspirin therapy, we typically recommend continuation of Aspirin throughout the perioperative period.  However, if the surgeon feels that cessation of Aspirin is required in the perioperative period, it may be stopped 5-7 days prior to surgery with a plan to resume it as soon as felt to be feasible from a surgical standpoint in the post-operative period.  I will route this recommendation to the requesting party via Epic fax function and remove from pre-op pool.  Please call with questions.  Darreld Mclean, PA-C 08/16/2021, 8:46 AM

## 2021-08-16 NOTE — Telephone Encounter (Signed)
Will go ahead and send normal myoview results to our pre-op pool for any final notes needed before routing clearance to the pts requesting Surgeon's office. Below is clearance for pt to proceed with surgery, per Dr. Johney Frame.    MYOCARDIAL PERFUSION IMAGING: Result Notes      Heather Bergeron, MD  08/15/2021 10:02 AM EST     Her stress test shows that there is evidence of scar in the region of the heart where she had a prior heart attack. There is no evidence of decreased blood flow to the other areas of the heart. Her heart pumping function is normal. No further testing needed prior to her surgery. We can write a clearance note if needed.

## 2021-08-17 ENCOUNTER — Telehealth: Payer: Self-pay | Admitting: Pulmonary Disease

## 2021-08-17 ENCOUNTER — Ambulatory Visit (HOSPITAL_COMMUNITY)
Admission: RE | Admit: 2021-08-17 | Discharge: 2021-08-17 | Disposition: A | Discharge status: Home / Self Care | Payer: Medicare PPO | Source: Ambulatory Visit | Attending: Urology | Admitting: Urology

## 2021-08-17 ENCOUNTER — Other Ambulatory Visit: Payer: Self-pay

## 2021-08-17 DIAGNOSIS — Z8551 Personal history of malignant neoplasm of bladder: Secondary | ICD-10-CM | POA: Insufficient documentation

## 2021-08-17 IMAGING — MR MR PELVIS WO/W CM
14 of 17 series · 38 of 48 positions shown · IV contrast (gadavist)
Comparison: [DATE]

CLINICAL DATA: History of bladder cancer

EXAM:
MRI ABDOMEN AND PELVIS WITHOUT AND WITH CONTRAST
TECHNIQUE: Multiplanar multisequence MR imaging of the abdomen and pelvis was
performed both before and after the administration of intravenous
contrast.
CONTRAST:  6mL GADAVIST GADOBUTROL 1 MMOL/ML IV SOLN

[Series 3: T2 · coronal · 6.0mm · 1.95mm/px · 1 of 40 slices shown]
[im 1/40]
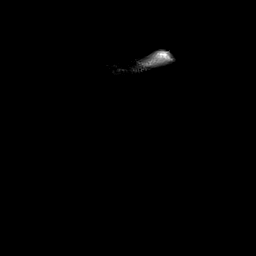

[Series 4: T2 fat-sat · axial · 6.0mm · 1.47mm/px · z∈[-194,+166]mm · 2 of 51 slices shown]
[im 1/51]
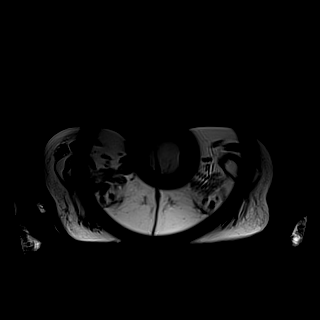
[im 51/51]
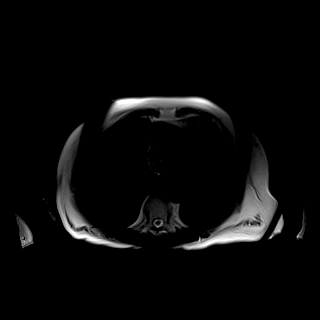

[Series 5: T1 · axial · 3.0mm · 1.25mm/px · z∈[-183,+174]mm · 4 of 120 slices shown (1 of 2)]
[im 1/120]
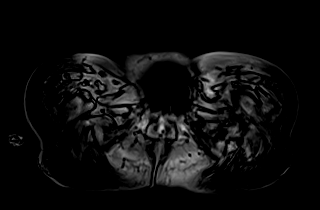
[im 40/120]
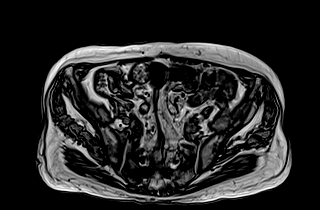
[im 80/120]
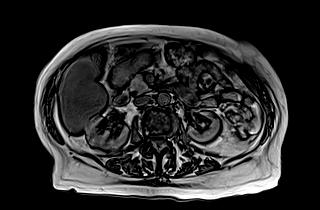
[im 120/120]
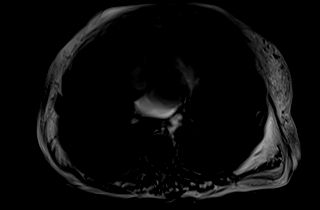

[Series 6: T1 · axial · 3.0mm · 1.25mm/px · z∈[-183,+174]mm · 4 of 120 slices shown (2 of 2)]
[im 1/120]
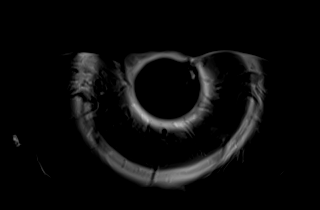
[im 40/120]
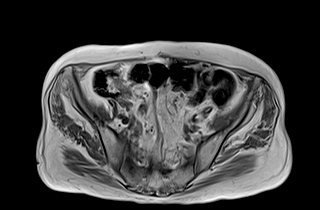
[im 80/120]
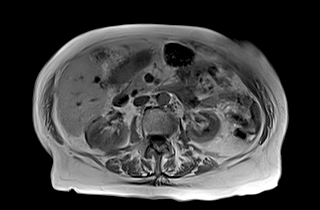
[im 120/120]
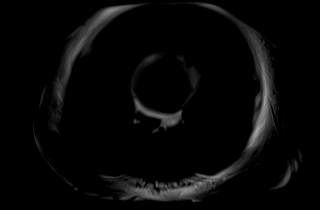

[Series 7: bSSFP · coronal · 7.0mm · 1.25mm/px · 1 of 29 slices shown]
[im 1/29]
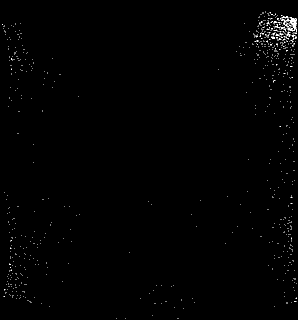

[Series 9: cor_3d_spc_trig · coronal · 1.0mm · 0.49mm/px · 3 of 72 slices shown]
[im 1/72]
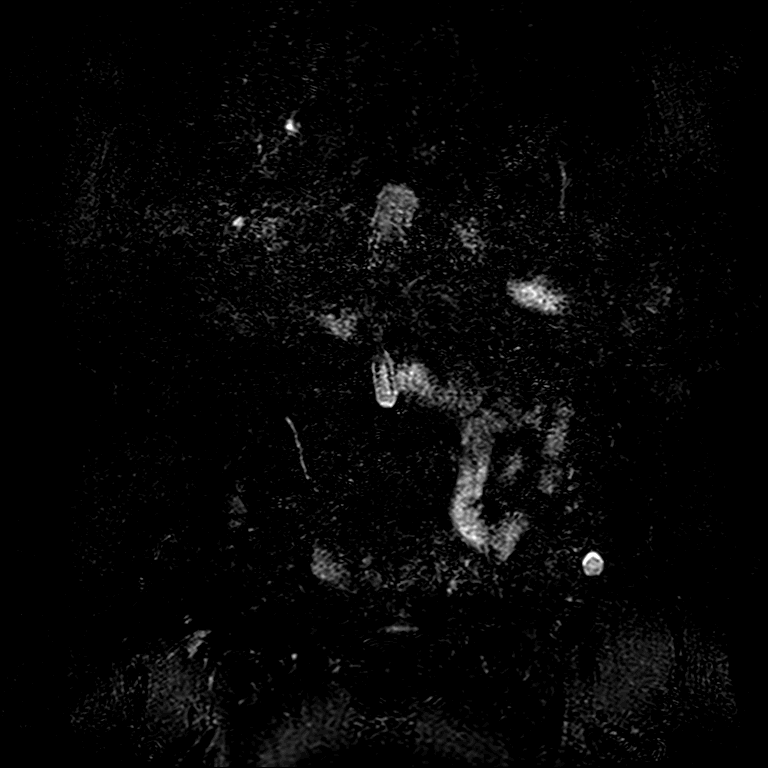
[im 36/72]
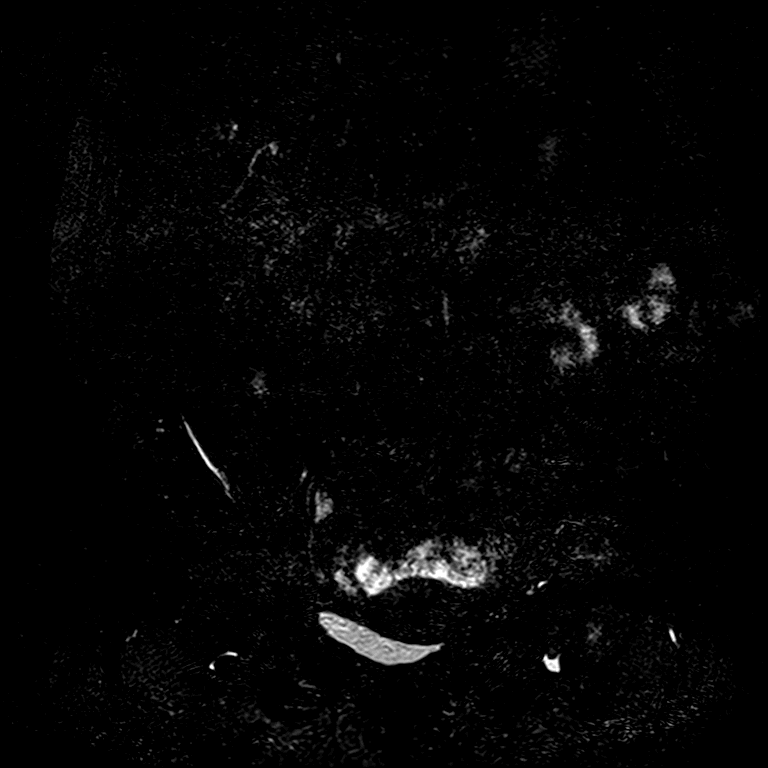
[im 72/72]
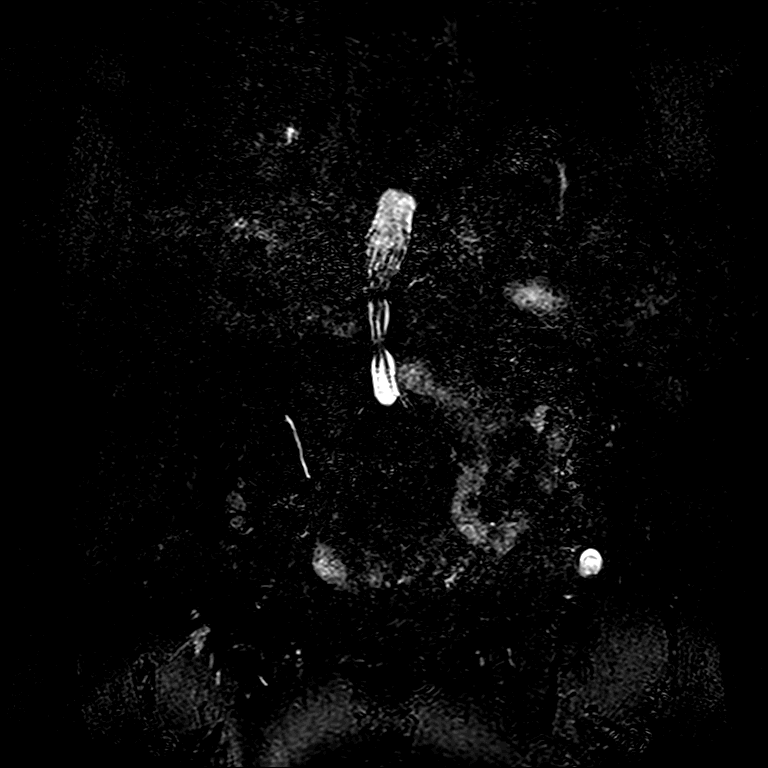

[Series 12: T1 dynamic · axial · 3.0mm · 1.00mm/px · z∈[-218,+19]mm · 3 of 80 slices shown (1 of 8)]
[im 1/80]
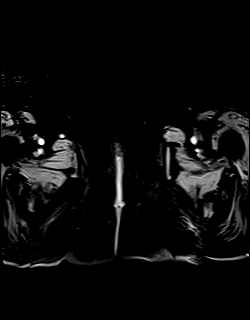
[im 40/80]
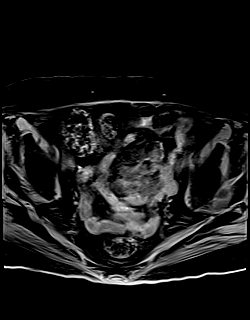
[im 80/80]
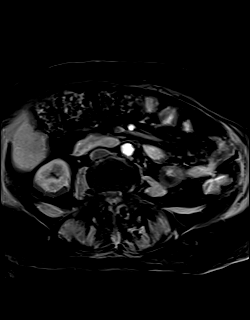

[Series 16: T1 dynamic · axial · 3.0mm · 1.25mm/px · z∈[-58,+179]mm · 3 of 80 slices shown (2 of 8)]
[im 1/80]
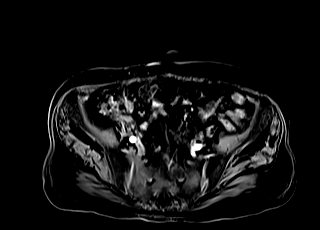
[im 40/80]
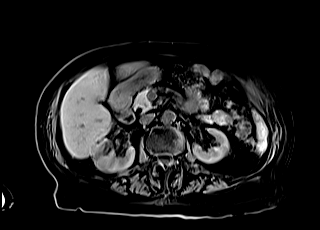
[im 80/80]
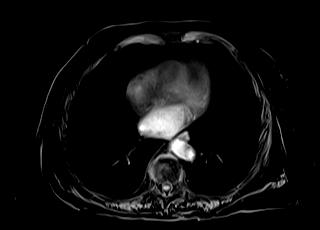

[Series 21: T1 dynamic · axial · 3.0mm · 1.25mm/px · z∈[-58,+179]mm · 3 of 80 slices shown (3 of 8)]
[im 1/80]
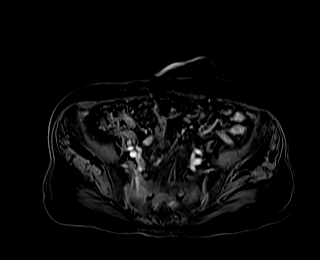
[im 40/80]
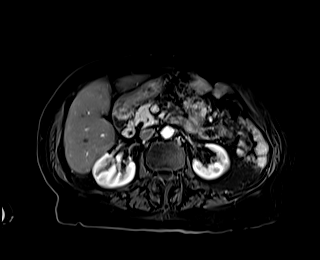
[im 80/80]
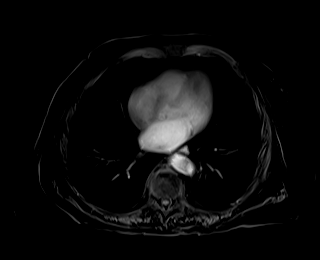

[Series 25: T1 dynamic · axial · 3.0mm · 1.25mm/px · z∈[-58,+179]mm · 3 of 80 slices shown (4 of 8)]
[im 1/80]
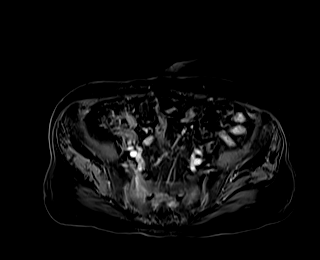
[im 40/80]
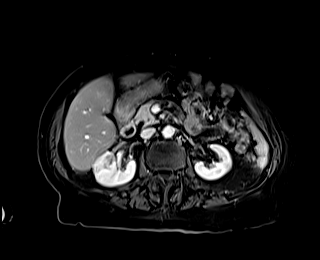
[im 80/80]
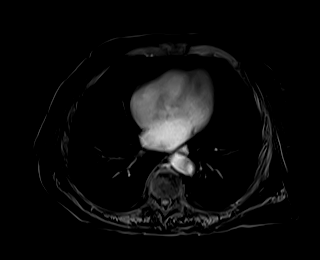

[Series 29: T1 dynamic · axial · 3.0mm · 1.25mm/px · z∈[-58,+179]mm · 3 of 80 slices shown (5 of 8)]
[im 1/80]
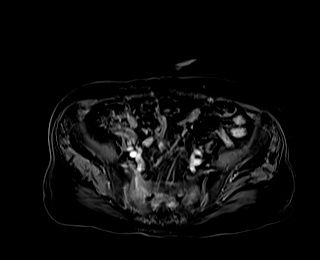
[im 40/80]
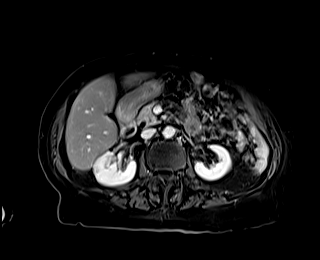
[im 80/80]
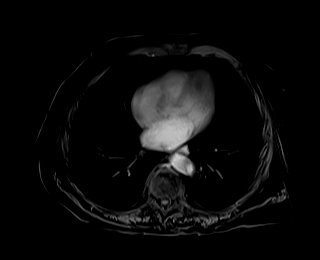

[Series 32: T1 dynamic · axial · 3.0mm · 1.00mm/px · z∈[-218,+19]mm · 3 of 80 slices shown (6 of 8)]
[im 1/80]
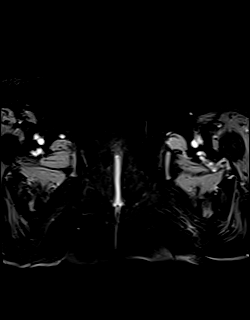
[im 40/80]
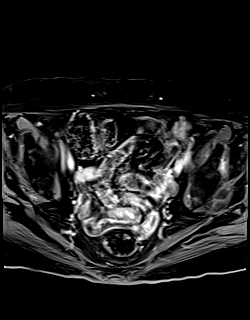
[im 80/80]
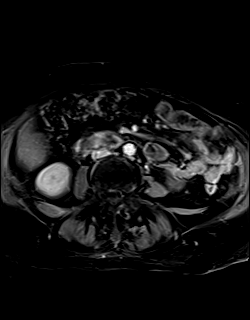

[Series 33: T1 dynamic · axial · 3.0mm · 1.00mm/px · z∈[-218,+19]mm · 3 of 80 slices shown (7 of 8)]
[im 1/80]
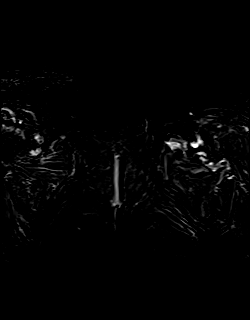
[im 40/80]
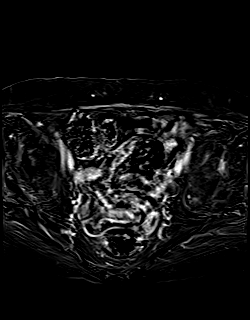
[im 80/80]
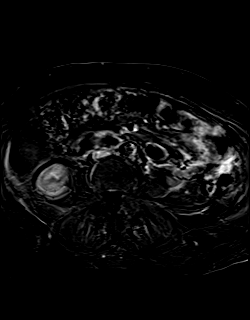

[Series 37: T1 dynamic · axial · 3.0mm · 1.25mm/px · z∈[-58,+59]mm · 2 of 80 slices shown (8 of 8)]
[im 1/80]
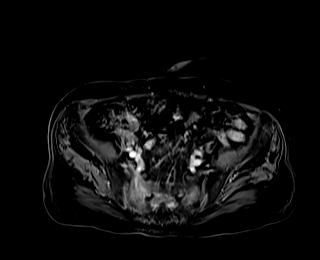
[im 40/80]
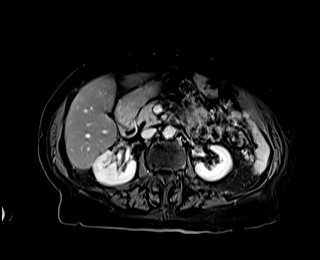

[38 of 48 positions shown; findings below may reference images not displayed]

FINDINGS: COMBINED FINDINGS FOR BOTH MR ABDOMEN AND PELVIS

Lower chest: No acute findings.

Hepatobiliary: Hepatic steatosis. No mass or other parenchymal
abnormality identified. Status post cholecystectomy. Mild
postoperative biliary ductal dilatation.

Pancreas: No mass, inflammatory changes, or other parenchymal
abnormality identified.No pancreatic ductal dilatation.

Spleen: Within normal limits in size and appearance. Small accessory
splenule.

Adrenals/Urinary Tract: Normal adrenal glands. Postoperative
findings of partial superior pole right nephrectomy. No renal masses
or suspicious contrast enhancement identified. Multiple small
simple, benign bilateral renal cysts. No evidence of hydronephrosis.
Normal urinary bladder. No mass or wall thickening identified.

Stomach/Bowel: Status post partial sleeve gastrectomy. Small hiatal
hernia. Visualized portions within the abdomen are otherwise
unremarkable.

Vascular/Lymphatic: No pathologically enlarged lymph nodes
identified. Aortic atherosclerosis. No abdominal aortic aneurysm
demonstrated.

Reproductive:

Other:  None.

Musculoskeletal: No suspicious osseous lesions identified. Extensive
marrow edema involving the bilateral sacral ala (series 4, image
31). There is new, severe collapse of the left femoral head with a
serpiginous region of low signal and underlying marrow edema and
enhancement (series 3, image 23).
IMPRESSION: 1. No evidence of mass, lymphadenopathy, or metastatic disease in
the abdomen or pelvis. Specifically no mass or other abnormality of
the urinary bladder.
2. Postoperative findings of partial superior pole right
nephrectomy. No evidence of local malignant recurrence.
3. Extensive marrow edema involving the bilateral sacral ala, most
consistent with acute to subacute insufficiency fractures.
4. There is new, severe collapse of the left femoral head with a
serpiginous region of low signal and underlying marrow edema and
enhancement, consistent with avascular necrosis and acute to
subacute femoral head collapse.
5. Hepatic steatosis.
6. Status post partial sleeve gastrectomy. Small hiatal hernia.

Aortic Atherosclerosis ([LB]-[LB]).

## 2021-08-17 MED ORDER — GADOBUTROL 1 MMOL/ML IV SOLN
6.0000 mL | Freq: Once | INTRAVENOUS | Status: AC | PRN
  Administered 2021-08-17: 6 mL via INTRAVENOUS

## 2021-08-17 NOTE — Telephone Encounter (Signed)
Will see patient on 2/13.   Thanks, JD

## 2021-08-17 NOTE — Telephone Encounter (Signed)
Fax received from Dr. Charlies Constable with [facility] to perform a Left total hip replacement on patient.  Patient needs surgery clearance. Patient was seen on 03/23/21 and is scheduled for OV on 08/23/21. Office protocol is a risk assessment can be sent to surgeon if patient has been seen in 60 days or less.   Sending to Dr. Erin Fulling for risk assessment or recommendations if patient needs to be seen in office prior to surgical procedure.

## 2021-08-17 NOTE — Telephone Encounter (Signed)
Patient is aware of below recommendations and voiced her understanding.  She will keep scheduled visit for 08/23/2021  Nothing further needed.

## 2021-08-19 NOTE — Telephone Encounter (Signed)
ATC patient to see if she needs to be rescheduled or if she no longer needs clearance from our office.

## 2021-08-23 ENCOUNTER — Ambulatory Visit: Payer: Medicare PPO | Admitting: Pulmonary Disease

## 2021-08-23 NOTE — Telephone Encounter (Signed)
Called and spoke with patient about her cancelled appointment for today to ask her if she still needed to be seen. She states that her PCP told her that she does not need to be cleared by our office. Nothing further needed at this time.

## 2021-08-26 ENCOUNTER — Ambulatory Visit: Payer: Medicare PPO | Admitting: Cardiology

## 2021-09-05 ENCOUNTER — Encounter (HOSPITAL_BASED_OUTPATIENT_CLINIC_OR_DEPARTMENT_OTHER): Payer: Self-pay | Admitting: Obstetrics and Gynecology

## 2021-09-05 ENCOUNTER — Other Ambulatory Visit: Payer: Self-pay

## 2021-09-05 ENCOUNTER — Emergency Department (HOSPITAL_BASED_OUTPATIENT_CLINIC_OR_DEPARTMENT_OTHER): Payer: Medicare PPO | Admitting: Radiology

## 2021-09-05 ENCOUNTER — Inpatient Hospital Stay (HOSPITAL_COMMUNITY): Payer: Medicare PPO

## 2021-09-05 ENCOUNTER — Inpatient Hospital Stay (HOSPITAL_BASED_OUTPATIENT_CLINIC_OR_DEPARTMENT_OTHER)
Admission: EM | Admit: 2021-09-05 | Discharge: 2021-09-10 | DRG: 956 | Disposition: A | Discharge status: Home Health | Payer: Medicare PPO | Attending: Internal Medicine | Admitting: Internal Medicine

## 2021-09-05 DIAGNOSIS — D7589 Other specified diseases of blood and blood-forming organs: Secondary | ICD-10-CM | POA: Diagnosis present

## 2021-09-05 DIAGNOSIS — M87851 Other osteonecrosis, right femur: Secondary | ICD-10-CM | POA: Diagnosis not present

## 2021-09-05 DIAGNOSIS — Z8669 Personal history of other diseases of the nervous system and sense organs: Secondary | ICD-10-CM

## 2021-09-05 DIAGNOSIS — Z9884 Bariatric surgery status: Secondary | ICD-10-CM | POA: Diagnosis not present

## 2021-09-05 DIAGNOSIS — I2510 Atherosclerotic heart disease of native coronary artery without angina pectoris: Secondary | ICD-10-CM | POA: Diagnosis present

## 2021-09-05 DIAGNOSIS — S72012A Unspecified intracapsular fracture of left femur, initial encounter for closed fracture: Principal | ICD-10-CM | POA: Diagnosis present

## 2021-09-05 DIAGNOSIS — I10 Essential (primary) hypertension: Secondary | ICD-10-CM | POA: Diagnosis present

## 2021-09-05 DIAGNOSIS — Z8551 Personal history of malignant neoplasm of bladder: Secondary | ICD-10-CM | POA: Diagnosis not present

## 2021-09-05 DIAGNOSIS — M87852 Other osteonecrosis, left femur: Secondary | ICD-10-CM | POA: Diagnosis present

## 2021-09-05 DIAGNOSIS — Z8249 Family history of ischemic heart disease and other diseases of the circulatory system: Secondary | ICD-10-CM

## 2021-09-05 DIAGNOSIS — R627 Adult failure to thrive: Secondary | ICD-10-CM | POA: Diagnosis present

## 2021-09-05 DIAGNOSIS — Z79899 Other long term (current) drug therapy: Secondary | ICD-10-CM

## 2021-09-05 DIAGNOSIS — Z833 Family history of diabetes mellitus: Secondary | ICD-10-CM | POA: Diagnosis not present

## 2021-09-05 DIAGNOSIS — E559 Vitamin D deficiency, unspecified: Secondary | ICD-10-CM | POA: Diagnosis present

## 2021-09-05 DIAGNOSIS — I252 Old myocardial infarction: Secondary | ICD-10-CM

## 2021-09-05 DIAGNOSIS — Z7982 Long term (current) use of aspirin: Secondary | ICD-10-CM

## 2021-09-05 DIAGNOSIS — S32592A Other specified fracture of left pubis, initial encounter for closed fracture: Secondary | ICD-10-CM | POA: Diagnosis present

## 2021-09-05 DIAGNOSIS — E875 Hyperkalemia: Secondary | ICD-10-CM | POA: Diagnosis present

## 2021-09-05 DIAGNOSIS — Z853 Personal history of malignant neoplasm of breast: Secondary | ICD-10-CM

## 2021-09-05 DIAGNOSIS — Z01810 Encounter for preprocedural cardiovascular examination: Secondary | ICD-10-CM

## 2021-09-05 DIAGNOSIS — E538 Deficiency of other specified B group vitamins: Secondary | ICD-10-CM | POA: Diagnosis present

## 2021-09-05 DIAGNOSIS — E785 Hyperlipidemia, unspecified: Secondary | ICD-10-CM | POA: Diagnosis present

## 2021-09-05 DIAGNOSIS — S72002A Fracture of unspecified part of neck of left femur, initial encounter for closed fracture: Secondary | ICD-10-CM

## 2021-09-05 DIAGNOSIS — Z9889 Other specified postprocedural states: Secondary | ICD-10-CM

## 2021-09-05 DIAGNOSIS — M25552 Pain in left hip: Secondary | ICD-10-CM | POA: Diagnosis not present

## 2021-09-05 DIAGNOSIS — D509 Iron deficiency anemia, unspecified: Secondary | ICD-10-CM | POA: Diagnosis present

## 2021-09-05 DIAGNOSIS — N179 Acute kidney failure, unspecified: Secondary | ICD-10-CM | POA: Diagnosis present

## 2021-09-05 DIAGNOSIS — Z9049 Acquired absence of other specified parts of digestive tract: Secondary | ICD-10-CM

## 2021-09-05 DIAGNOSIS — E871 Hypo-osmolality and hyponatremia: Secondary | ICD-10-CM | POA: Diagnosis present

## 2021-09-05 DIAGNOSIS — Z419 Encounter for procedure for purposes other than remedying health state, unspecified: Secondary | ICD-10-CM

## 2021-09-05 DIAGNOSIS — W1830XA Fall on same level, unspecified, initial encounter: Secondary | ICD-10-CM | POA: Diagnosis present

## 2021-09-05 DIAGNOSIS — Z20822 Contact with and (suspected) exposure to covid-19: Secondary | ICD-10-CM | POA: Diagnosis present

## 2021-09-05 DIAGNOSIS — Z91041 Radiographic dye allergy status: Secondary | ICD-10-CM

## 2021-09-05 DIAGNOSIS — D6489 Other specified anemias: Secondary | ICD-10-CM | POA: Diagnosis present

## 2021-09-05 DIAGNOSIS — D72828 Other elevated white blood cell count: Secondary | ICD-10-CM | POA: Diagnosis present

## 2021-09-05 DIAGNOSIS — D649 Anemia, unspecified: Secondary | ICD-10-CM | POA: Diagnosis not present

## 2021-09-05 DIAGNOSIS — Z803 Family history of malignant neoplasm of breast: Secondary | ICD-10-CM

## 2021-09-05 DIAGNOSIS — Z87891 Personal history of nicotine dependence: Secondary | ICD-10-CM

## 2021-09-05 DIAGNOSIS — S72009A Fracture of unspecified part of neck of unspecified femur, initial encounter for closed fracture: Secondary | ICD-10-CM | POA: Diagnosis present

## 2021-09-05 DIAGNOSIS — D62 Acute posthemorrhagic anemia: Secondary | ICD-10-CM | POA: Diagnosis present

## 2021-09-05 HISTORY — DX: Acute myocardial infarction, unspecified: I21.9

## 2021-09-05 LAB — COMPREHENSIVE METABOLIC PANEL
ALT: 17 U/L (ref 0–44)
AST: 22 U/L (ref 15–41)
Albumin: 3 g/dL — ABNORMAL LOW (ref 3.5–5.0)
Alkaline Phosphatase: 282 U/L — ABNORMAL HIGH (ref 38–126)
Anion gap: 7 (ref 5–15)
BUN: 22 mg/dL (ref 8–23)
CO2: 20 mmol/L — ABNORMAL LOW (ref 22–32)
Calcium: 8.7 mg/dL — ABNORMAL LOW (ref 8.9–10.3)
Chloride: 107 mmol/L (ref 98–111)
Creatinine, Ser: 1.1 mg/dL — ABNORMAL HIGH (ref 0.44–1.00)
GFR, Estimated: 54 mL/min — ABNORMAL LOW (ref >60)
Glucose, Bld: 101 mg/dL — ABNORMAL HIGH (ref 70–99)
Potassium: 5.6 mmol/L — ABNORMAL HIGH (ref 3.5–5.1)
Sodium: 134 mmol/L — ABNORMAL LOW (ref 135–145)
Total Bilirubin: 0.3 mg/dL (ref 0.3–1.2)
Total Protein: 6.8 g/dL (ref 6.5–8.1)

## 2021-09-05 LAB — BASIC METABOLIC PANEL
Anion gap: 11 (ref 5–15)
BUN: 19 mg/dL (ref 8–23)
CO2: 19 mmol/L — ABNORMAL LOW (ref 22–32)
Calcium: 9.1 mg/dL (ref 8.9–10.3)
Chloride: 103 mmol/L (ref 98–111)
Creatinine, Ser: 0.98 mg/dL (ref 0.44–1.00)
GFR, Estimated: >60 mL/min (ref >60)
Glucose, Bld: 89 mg/dL (ref 70–99)
Potassium: 5.5 mmol/L — ABNORMAL HIGH (ref 3.5–5.1)
Sodium: 133 mmol/L — ABNORMAL LOW (ref 135–145)

## 2021-09-05 LAB — CBC WITH DIFFERENTIAL/PLATELET
Abs Immature Granulocytes: 0.05 10*3/uL (ref 0.00–0.07)
Basophils Absolute: 0 10*3/uL (ref 0.0–0.1)
Basophils Relative: 0 %
Eosinophils Absolute: 0.1 10*3/uL (ref 0.0–0.5)
Eosinophils Relative: 1 %
HCT: 32.4 % — ABNORMAL LOW (ref 36.0–46.0)
Hemoglobin: 10.4 g/dL — ABNORMAL LOW (ref 12.0–15.0)
Immature Granulocytes: 1 %
Lymphocytes Relative: 21 %
Lymphs Abs: 1.9 10*3/uL (ref 0.7–4.0)
MCH: 32.3 pg (ref 26.0–34.0)
MCHC: 32.1 g/dL (ref 30.0–36.0)
MCV: 100.6 fL — ABNORMAL HIGH (ref 80.0–100.0)
Monocytes Absolute: 0.9 10*3/uL (ref 0.1–1.0)
Monocytes Relative: 10 %
Neutro Abs: 6.3 10*3/uL (ref 1.7–7.7)
Neutrophils Relative %: 67 %
Platelets: 287 10*3/uL (ref 150–400)
RBC: 3.22 MIL/uL — ABNORMAL LOW (ref 3.87–5.11)
RDW: 14 % (ref 11.5–15.5)
WBC: 9.3 10*3/uL (ref 4.0–10.5)
nRBC: 0 % (ref 0.0–0.2)

## 2021-09-05 LAB — MAGNESIUM: Magnesium: 2.4 mg/dL (ref 1.7–2.4)

## 2021-09-05 LAB — RESP PANEL BY RT-PCR (FLU A&B, COVID) ARPGX2
Influenza A by PCR: NEGATIVE
Influenza B by PCR: NEGATIVE
SARS Coronavirus 2 by RT PCR: NEGATIVE

## 2021-09-05 LAB — SURGICAL PCR SCREEN
MRSA, PCR: NEGATIVE
Staphylococcus aureus: NEGATIVE

## 2021-09-05 IMAGING — DX DG CHEST 1V PORT
1 series · 1 of 1 positions shown · non-contrast
Comparison: [DATE]

CLINICAL DATA: Smoking history. Preoperative exam for hip surgery
for left femoral neck fracture

EXAM:
PORTABLE CHEST 1 VIEW

[chest ap]
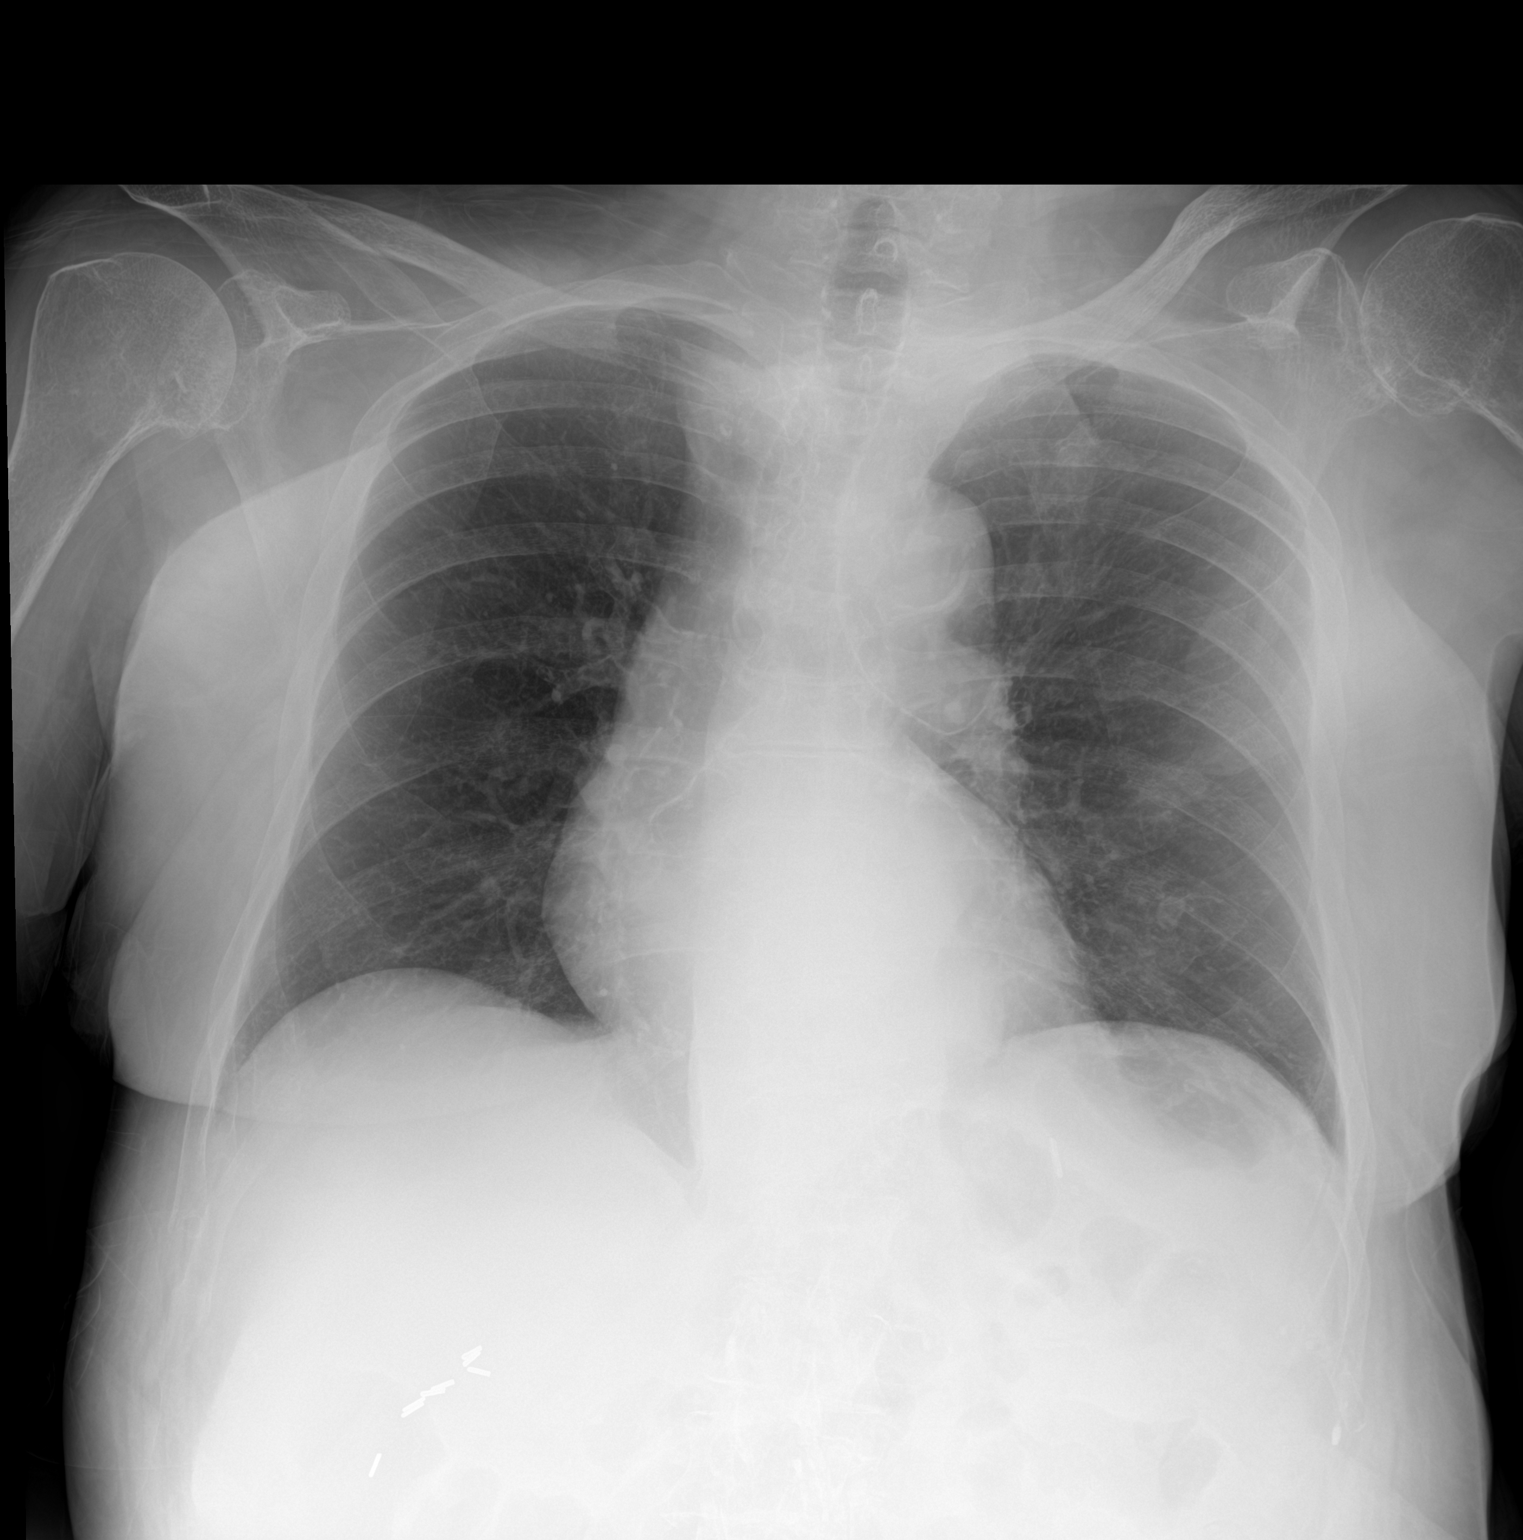

[1 of 1 positions shown; findings below may reference images not displayed]

FINDINGS: The heart size and mediastinal contours are within normal limits.
Aortic atherosclerosis. Ovoid area of subtly increased density
projecting over the mid left lung measuring up to 15 mm,
indeterminate. 8 mm rounded calcification projects over the left
lower lobe compatible with soft tissue calcification of the left
chest wall seen on CT. Lungs are otherwise clear. No pleural
effusion or pneumothorax. The visualized skeletal structures are
unremarkable.
IMPRESSION: 1. Ovoid area of subtly increased density projecting over the mid
left lung, possibly representing nipple shadow, overlapping rib,
versus pulmonary nodule. Recommend repeat PA and lateral radiographs
of the chest with nipple markers to further evaluate.
2. Otherwise, no acute cardiopulmonary findings.

## 2021-09-05 MED ORDER — MAGNESIUM HYDROXIDE 400 MG/5ML PO SUSP: 30.0000 mL | Freq: Every day | ORAL | Status: DC | PRN

## 2021-09-05 MED ORDER — LIDOCAINE 5 % EX PTCH
1.0000 | MEDICATED_PATCH | CUTANEOUS | Status: DC
  Administered 2021-09-05 – 2021-09-10 (×4): 1 via TRANSDERMAL
  Filled 2021-09-05 (×6): qty 1

## 2021-09-05 MED ORDER — ONDANSETRON HCL 4 MG PO TABS: 4.0000 mg | ORAL_TABLET | Freq: Four times a day (QID) | ORAL | Status: DC | PRN

## 2021-09-05 MED ORDER — SENNA 8.6 MG PO TABS: 1.0000 | ORAL_TABLET | Freq: Two times a day (BID) | ORAL | Status: DC | PRN

## 2021-09-05 MED ORDER — MORPHINE SULFATE (PF) 4 MG/ML IV SOLN
4.0000 mg | Freq: Once | INTRAVENOUS | Status: AC
Start: 2021-09-05 — End: 2021-09-05
  Administered 2021-09-05: 4 mg via INTRAVENOUS
  Filled 2021-09-05: qty 1

## 2021-09-05 MED ORDER — EZETIMIBE 10 MG PO TABS
10.0000 mg | ORAL_TABLET | Freq: Every day | ORAL | Status: DC
  Administered 2021-09-05 – 2021-09-09 (×5): 10 mg via ORAL
  Filled 2021-09-05 (×5): qty 1

## 2021-09-05 MED ORDER — ASPIRIN EC 81 MG PO TBEC: 81.0000 mg | DELAYED_RELEASE_TABLET | ORAL | Status: DC

## 2021-09-05 MED ORDER — ACETAMINOPHEN 325 MG PO TABS
650.0000 mg | ORAL_TABLET | Freq: Four times a day (QID) | ORAL | Status: DC | PRN
  Administered 2021-09-05 – 2021-09-08 (×3): 650 mg via ORAL
  Filled 2021-09-05 (×3): qty 2

## 2021-09-05 MED ORDER — FAMOTIDINE 20 MG PO TABS
20.0000 mg | ORAL_TABLET | Freq: Every day | ORAL | Status: DC
  Administered 2021-09-05 – 2021-09-09 (×5): 20 mg via ORAL
  Filled 2021-09-05 (×5): qty 1

## 2021-09-05 MED ORDER — CARVEDILOL 3.125 MG PO TABS
3.1250 mg | ORAL_TABLET | Freq: Two times a day (BID) | ORAL | Status: DC
  Administered 2021-09-05 – 2021-09-10 (×11): 3.125 mg via ORAL
  Filled 2021-09-05 (×11): qty 1

## 2021-09-05 MED ORDER — SERTRALINE HCL 100 MG PO TABS
100.0000 mg | ORAL_TABLET | Freq: Every day | ORAL | Status: DC
  Administered 2021-09-06 – 2021-09-10 (×5): 100 mg via ORAL
  Filled 2021-09-05 (×6): qty 1

## 2021-09-05 MED ORDER — ONDANSETRON HCL 4 MG/2ML IJ SOLN: 4.0000 mg | Freq: Four times a day (QID) | INTRAMUSCULAR | Status: DC | PRN

## 2021-09-05 MED ORDER — SENNA 8.6 MG PO TABS
1.0000 | ORAL_TABLET | Freq: Two times a day (BID) | ORAL | Status: DC
  Administered 2021-09-05: 8.6 mg via ORAL
  Filled 2021-09-05: qty 1

## 2021-09-05 MED ORDER — METHOCARBAMOL 500 MG PO TABS
500.0000 mg | ORAL_TABLET | Freq: Every evening | ORAL | Status: DC | PRN
  Administered 2021-09-05 – 2021-09-08 (×3): 500 mg via ORAL
  Filled 2021-09-05 (×3): qty 1

## 2021-09-05 MED ORDER — HYDROMORPHONE HCL 1 MG/ML IJ SOLN
0.5000 mg | INTRAMUSCULAR | Status: DC | PRN
  Administered 2021-09-06: 0.5 mg via INTRAVENOUS
  Filled 2021-09-05: qty 0.5

## 2021-09-05 MED ORDER — SIMVASTATIN 40 MG PO TABS
80.0000 mg | ORAL_TABLET | Freq: Every day | ORAL | Status: DC
  Administered 2021-09-05 – 2021-09-09 (×5): 80 mg via ORAL
  Filled 2021-09-05 (×5): qty 2

## 2021-09-05 MED ORDER — HYDROCODONE-ACETAMINOPHEN 5-325 MG PO TABS
1.0000 | ORAL_TABLET | Freq: Once | ORAL | Status: AC
  Administered 2021-09-05: 1 via ORAL
  Filled 2021-09-05: qty 1

## 2021-09-05 MED ORDER — OXYCODONE HCL 5 MG PO TABS
5.0000 mg | ORAL_TABLET | ORAL | Status: DC | PRN
  Administered 2021-09-05 – 2021-09-09 (×14): 5 mg via ORAL
  Filled 2021-09-05 (×14): qty 1

## 2021-09-05 MED ORDER — EZETIMIBE-SIMVASTATIN 10-80 MG PO TABS
1.0000 | ORAL_TABLET | Freq: Every day | ORAL | Status: DC
  Filled 2021-09-05 (×2): qty 1

## 2021-09-05 MED ORDER — IRBESARTAN 150 MG PO TABS
300.0000 mg | ORAL_TABLET | Freq: Every day | ORAL | Status: DC
Start: 2021-09-05 — End: 2021-09-05

## 2021-09-05 MED ORDER — ONDANSETRON HCL 4 MG/2ML IJ SOLN
4.0000 mg | Freq: Once | INTRAMUSCULAR | Status: AC
  Administered 2021-09-05: 4 mg via INTRAVENOUS
  Filled 2021-09-05: qty 2

## 2021-09-05 MED ORDER — ACETAMINOPHEN 650 MG RE SUPP
650.0000 mg | Freq: Four times a day (QID) | RECTAL | Status: DC | PRN
Start: 2021-09-05 — End: 2021-09-10

## 2021-09-05 NOTE — Plan of Care (Signed)

## 2021-09-05 NOTE — ED Provider Notes (Signed)
[facility] EMERGENCY DEPT Provider Note   CSN: [id number] Arrival date & time: 09/05/21  6644     History  Chief Complaint  Patient presents with   Hip Pain    Heather Garrett is a 71 y.o. female.  This is a 71 y.o.  fem with significant medical history as below, including CAD, bladder ca, vit d deficiency who presents to the ED with complaint of left hip pain x multiple mos.    Seen in ED 12/21 with similar complaint, workup re-assurring and was given ortho f/u as o/p. She was seen by vascular in the past 2/2 superficial thrombophlebitis noted 11/22, recommended compression stockings. She last saw cardio 1/27 for pre-op clearance for hip arthroplasty. Planned for left total hip with Murphy/wainer, pending clearance, schedule has not been scheduled yet per patient.  Patient reports 2 weeks ago she had a fall, has been having progressively worsening pain to her left hip since the fall.  Has been taking Motrin intermittently with mild improvement to her symptoms.  Pain localized to the anterior portion of her left hip, inguinal area, lateral hip.  No low back pain.  No thinners.  No numbness or tingling.  She has been ambulating with a walker at home but over the past few days her mobility has decreased dramatically and has limited weightbearing to left hip.  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 No language interpreter was used.  Hip Pain Pertinent negatives include no chest pain, no abdominal pain, no headaches and no shortness of breath.      Home Medications Prior to Admission medications   Medication Sig Start Date End Date Taking? Authorizing Provider  acetaminophen (TYLENOL) 500 MG tablet Take 1,000 mg by mouth every 6 (six) hours as needed for mild pain.   Yes [provider]  aspirin 81 MG EC tablet Take 81 mg by mouth every other day. Swallow whole.   Yes [provider]  carvedilol (COREG) 3.125 MG tablet Take 1 tablet (3.125 mg total) by mouth 2 (two) times daily. 05/17/21  Yes [name], MD  ezetimibe-simvastatin (VYTORIN) 10-80 MG tablet Take 1 tablet by mouth at bedtime.   Yes [provider]  famotidine (PEPCID) 40 MG tablet Take 40 mg by mouth daily.   Yes [provider]  ibuprofen (ADVIL) 200 MG tablet Take 600 mg by mouth 2 (two) times daily as needed.   Yes [provider]  lidocaine (XYLOCAINE) 5 % ointment Apply 1 application topically as needed. 06/30/21  Yes Long, Wonda Olds, MD  NURTEC 75 MG TBDP Take 1 tablet by mouth daily as needed for headache. 05/19/21  Yes [provider]  sertraline (ZOLOFT) 100 MG tablet Take 100 mg by mouth daily.   Yes [provider]  tiZANidine (ZANAFLEX) 4 MG capsule  Take 4 mg by mouth at bedtime.   Yes [provider]  valsartan (DIOVAN) 320 MG tablet Take 320 mg by mouth daily.   Yes [provider]  hydrochlorothiazide (HYDRODIURIL) 25 MG tablet Take 25 mg by mouth daily as needed.    [provider]  VITAMIN D PO Take 1 tablet by mouth daily.    [provider]      Allergies    Contrast media [iodinated contrast media], Ciprofloxacin, Erythromycin base, and  Sulfabenzamide    Review of Systems   Review of Systems  Constitutional:  Negative for chills and fever.  HENT:  Negative for facial swelling and trouble swallowing.   Eyes:  Negative for photophobia and visual disturbance.  Respiratory:  Negative for cough and shortness of breath.   Cardiovascular:  Negative for chest pain and palpitations.  Gastrointestinal:  Negative for abdominal pain, nausea and vomiting.  Endocrine: Negative for polydipsia and polyuria.  Genitourinary:  Negative for difficulty urinating and hematuria.  Musculoskeletal:  Positive for arthralgias and gait problem. Negative for joint swelling.  Skin:  Negative for pallor and rash.  Neurological:  Negative for syncope and headaches.  Psychiatric/Behavioral:  Negative for agitation and confusion.    Physical Exam Updated Vital Signs BP 107/74 (BP Location: Left Arm)    Pulse 77    Temp 98.8 F (37.1 C) (Oral)    Resp 17    Ht 4\' 8"  (1.422 m)    Wt 49.4 kg    SpO2 96%    BMI 24.44 kg/m  Physical Exam Vitals and nursing note reviewed.  Constitutional:      General: She is not in acute distress.    Appearance: Normal appearance. She is not ill-appearing, toxic-appearing or diaphoretic.  HENT:     Head: Normocephalic and atraumatic.     Right Ear: External ear normal.     Left Ear: External ear normal.     Nose: Nose normal.     Mouth/Throat:     Mouth: Mucous membranes are moist.  Eyes:     General: No scleral icterus.       Right eye: No discharge.        Left eye: No discharge.  Cardiovascular:     Rate and Rhythm: Normal rate and regular rhythm.     Pulses: Normal pulses.     Heart sounds: Normal heart sounds.  Pulmonary:     Effort: Pulmonary effort is normal. No respiratory distress.     Breath sounds: Normal breath sounds.  Abdominal:     General: Abdomen is flat.     Tenderness: There is no abdominal tenderness.  Musculoskeletal:        General: Normal range of motion.     Cervical back: Normal  range of motion.     Right lower leg: No edema.     Left lower leg: No edema.       Legs:     Comments: Lower extremities are neurovascularly intact.  No pain to bilateral knee or ankle with provocative testing.  2+ DP pulses equal bilateral.  Cap refill is brisk bilateral.  Reduced range of motion to left lower extremity secondary to discomfort  Skin:    General: Skin is warm and dry.     Capillary Refill: Capillary refill takes less than 2 seconds.  Neurological:     Mental Status: She is alert.  Psychiatric:        Mood and Affect: Mood normal.  Behavior: Behavior normal.    ED Results / Procedures / Treatments   Labs (all labs ordered are listed, but only abnormal results are displayed) Labs Reviewed  CBC WITH DIFFERENTIAL/PLATELET - Abnormal; Notable for the following components:      Result Value   RBC 3.22 (*)    Hemoglobin 10.4 (*)    HCT 32.4 (*)    MCV 100.6 (*)    All other components within normal limits  BASIC METABOLIC PANEL - Abnormal; Notable for the following components:   Sodium 133 (*)    Potassium 5.5 (*)    CO2 19 (*)    All other components within normal limits  RESP PANEL BY RT-PCR (FLU A&B, COVID) ARPGX2  SURGICAL PCR SCREEN  COMPREHENSIVE METABOLIC PANEL  MAGNESIUM    EKG None  Radiology Chest Portable 1 View  Result Date: 09/05/2021 CLINICAL DATA:  Smoking history. Preoperative exam for hip surgery for left femoral neck fracture EXAM: PORTABLE CHEST 1 VIEW COMPARISON:  01/02/2021 FINDINGS: The heart size and mediastinal contours are within normal limits. Aortic atherosclerosis. Ovoid area of subtly increased density projecting over the mid left lung measuring up to 15 mm, indeterminate. 8 mm rounded calcification projects over the left lower lobe compatible with soft tissue calcification of the left chest wall seen on CT. Lungs are otherwise clear. No pleural effusion or pneumothorax. The visualized skeletal structures are unremarkable.  IMPRESSION: 1. Ovoid area of subtly increased density projecting over the mid left lung, possibly representing nipple shadow, overlapping rib, versus pulmonary nodule. Recommend repeat PA and lateral radiographs of the chest with nipple markers to further evaluate. 2. Otherwise, no acute cardiopulmonary findings. Electronically Signed   By: Davina Poke D.O.   On: 09/05/2021 13:41   DG Hip Unilat W or Wo Pelvis 2-3 Views Left  Result Date: 09/05/2021 CLINICAL DATA:  Trauma, fall, pain EXAM: DG HIP (WITH OR WITHOUT PELVIS) 2-3V LEFT COMPARISON:  06/30/2021 FINDINGS: Fracture is seen in the subcapital portion of neck of left femur. There is overriding of fracture fragments. Flattening and sclerosis seen in the left femoral head suggests possible avascular necrosis. Arterial calcifications are seen in the soft tissues. IMPRESSION: Displaced fracture is seen in the subcapital portion of neck of left femur. Electronically Signed   By: Elmer Picker M.D.   On: 09/05/2021 09:38    Procedures Procedures    Medications Ordered in ED Medications  lidocaine (LIDODERM) 5 % 1 patch (1 patch Transdermal Patch Applied 09/05/21 0940)  oxyCODONE (Oxy IR/ROXICODONE) immediate release tablet 5 mg (5 mg Oral Given 09/05/21 1330)  HYDROmorphone (DILAUDID) injection 0.5 mg (has no administration in time range)  magnesium hydroxide (MILK OF MAGNESIA) suspension 30 mL (has no administration in time range)  acetaminophen (TYLENOL) tablet 650 mg (650 mg Oral Given 09/05/21 1330)    Or  acetaminophen (TYLENOL) suppository 650 mg ( Rectal See Alternative 09/05/21 1330)  ondansetron (ZOFRAN) tablet 4 mg (has no administration in time range)    Or  ondansetron (ZOFRAN) injection 4 mg (has no administration in time range)  carvedilol (COREG) tablet 3.125 mg (has no administration in time range)  aspirin EC tablet 81 mg (has no administration in time range)  ezetimibe-simvastatin (VYTORIN) 10-80 MG per tablet 1 tablet  (has no administration in time range)  famotidine (PEPCID) tablet 20 mg (has no administration in time range)  sertraline (ZOLOFT) tablet 100 mg (has no administration in time range)  methocarbamol (ROBAXIN) tablet 500 mg (has no administration  in time range)  senna (SENOKOT) tablet 8.6 mg (has no administration in time range)  HYDROcodone-acetaminophen (NORCO/VICODIN) 5-325 MG per tablet 1 tablet (1 tablet Oral Given 09/05/21 0940)  morphine (PF) 4 MG/ML injection 4 mg (4 mg Intravenous Given 09/05/21 1143)  ondansetron (ZOFRAN) injection 4 mg (4 mg Intravenous Given 09/05/21 1142)    ED Course/ Medical Decision Making/ A&P                           Medical Decision Making Amount and/or Complexity of Data Reviewed Labs: ordered. Radiology: ordered. Decision-making details documented in ED Course.  Risk Prescription drug management. Decision regarding hospitalization.   Initial Impression and Ddx CC: Left hip pain  This patient presents to the Emergency Department for the above complaint. This involves an extensive number of treatment options and is a complaint that carries with it a high risk of complications and morbidity. Vital signs were reviewed. Serious etiologies considered.  Patient PMH that increases complexity of ED encounter: Age, bladder cancer  Record review:  Previous records obtained and reviewed  >>Seen in ED 12/21 with similar complaint, workup re-assurring and was given ortho f/u as o/p. She was seen by vascular in the past 2/2 superficial thrombophlebitis noted 11/22, recommended compression stockings. She last saw cardio 1/27 for pre-op clearance for hip arthroplasty. Planned for left total hip with Murphy/wainer, pending clearance, schedule has not been scheduled yet per patient.  Additional history obtained from spouse  Medical and surgical history as noted above.    Interpretation of Diagnostics maging results that were available during my care of the  patient were reviewed by me and considered in my medical decision making.    I personally reviewed the hip x-ray, I agree with radiology interpretation left femoral neck fracture, displaced      Social determinants of health include - N/a  Personally discussed patient care with consultant; Dr. Doreatha Martin w/ [facility].    Patient Reassessment and Ultimate Disposition/Management  Patient management required discussion with the following services or consulting groups:  Orthopedic Surgery  Complexity of Problems Addressed Acute illness or injury that poses threat of life of bodily function  Additional Data Reviewed and Analyzed Further history obtained from: Further history from spouse/family member, Past medical history and medications listed in the EMR, Prior ED visit notes, Recent PCP notes, Recent Consult notes, and Prior labs/imaging results  Patient Encounter Risk Assessment Use of parenteral controlled substances and Consideration of hospitalization   Discussed with orthopedic surgery regarding left-sided femoral neck fracture that is displaced.  Recommend admission to hospital for surgical repair of left hip. Dr Zachery Dakins to likely perform surgery, will not be today. Okay to eat today, likely NPO @ [facility] tonight.   Will admit to medical service, plan for OR  tomorrow.     This chart was dictated using voice recognition software.  Despite best efforts to proofread,  errors can occur which can change the documentation meaning.         Final Clinical Impression(s) / ED Diagnoses Final diagnoses:  Displaced fracture of left femoral neck [facility])    Rx / DC Orders ED Discharge Orders     None         Jeanell Sparrow, DO 09/05/21 1543

## 2021-09-05 NOTE — Progress Notes (Signed)
Plan of Care Note for accepted transfer   Patient: RAKSHA WOLFGANG MRN: [id number]   [place]: 09/05/2021  Facility requesting transfer: [facility] Requesting Provider: Pearline Cables Reason for transfer: Hip fracture  Facility course: Patient with h/o HLD and bladder cancer presenting with hip pain.  She had a fall several weeks ago and has had decreased mobility since.  L femoral neck fracture, displaced.  Orthopedics requests admission to [facility] for repair tomorrow, Dr. Ephraim Hamburger will operate.     Plan of care: The patient is accepted for admission to [facility]  unit, at [facility]..    Author: Karmen Bongo, MD 09/05/2021  Check [email or web address] for on-call coverage.  Nursing staff, Please call [place] number on Amion as soon as patient's arrival, so appropriate admitting provider can evaluate the pt.

## 2021-09-05 NOTE — ED Triage Notes (Signed)
Patient reports to the ER for left hip pain. Patient reports she had a fall a couple weeks ago and her mobility has continued to decline. Patient reports initally she could walk with a walker but now she cannot walk hardly at all and has to have assistance to stand and pivot.

## 2021-09-05 NOTE — H&P (Signed)
History and Physical    Patient: Heather Garrett DOB: [birth year] DOA: 09/05/2021 DOS: the patient was seen and examined on 09/05/2021 PCP: [name], MD  Patient coming from: Home  Chief Complaint:  Chief Complaint  Patient presents with   Hip Pain    HPI: Heather Garrett is a 71 y.o. female with medical history significant of biceps tendinitis, cobalamin deficiency, CAD, abnormal LFTs, hyperlipidemia, migraine headaches, rotator cuff syndrome, bladder transitional carcinoma, vitamin D deficiency who had a fall about 2 weeks ago hitting her head, but no LOC.  She did develop progressively worse left hip pain and decrease in ROM.  She has been sleeping in a recliner due to pain.  She is now having a lot of difficulty to walk.  She is needing significant assistance to stand and able.  She stated that the fall was mechanical.  No prodromal symptoms.  She denies fever, chills, productive cough, dyspnea, wheezing, chest pain, palpitations, diaphoresis, PND, orthopnea or recent lower extremity edema.  She has felt nauseous at times, but no abdominal pain, vomiting, constipation, melena or hematochezia.  She gets occasional diarrhea.  Denied, dysuria, frequency or hematuria.  No polyuria, polydipsia, polyphagia or blurred vision.  ED course: Initial vital signs were temperature The patient received hydrocodone/APAP 5/325 mg p.o. x1, morphine 4 mg IVP x1 and ondansetron 4 mg IVP x1.  Orthopedic surgery was contacted and requested medical admission.  Lab work: CBC is her white count 9.3, hemoglobin 10.4 g/dL with an MCV of 100.6 fL and platelets 287.  BMP showed a sodium of 133, potassium 5.5 and CO2 19 mmol/L.  Anion gap, chloride, calcium, glucose and renal function were normal.  Coronavirus PCR was negative.  Imaging: Left hip x-ray showed displaced fracture in the subcapital portion of the left femoral neck.  Review of Systems: As mentioned in the history of present  illness. All other systems reviewed and are negative. Past Medical History:  Diagnosis Date   Biceps tendinitis    Cobalamin deficiency    Coronary arteriosclerosis in native artery    Elevated liver function tests    Hyperlipidemia    Migraine    Rotator cuff syndrome    Transitional cell carcinoma, bladder (HCC)    Vitamin D deficiency    Past Surgical History:  Procedure Laterality Date   BREAST SURGERY     CARDIAC CATHETERIZATION     CATARACT EXTRACTION     CHOLECYSTECTOMY  1979   DG  BONE DENSITY ([place] HX)     ENDOMETRIAL ABLATION     GASTRIC BYPASS     MASTECTOMY     OTHER SURGICAL HISTORY     CANCER SURGERY   Social History:  reports that she has quit smoking. She has never been exposed to tobacco smoke. She has never used smokeless tobacco. She reports that she does not currently use alcohol after a past usage of about 14.0 standard drinks per week. She reports that she does not use drugs.  Allergies  Allergen Reactions   Contrast Media [Iodinated Contrast Media] Anaphylaxis and Hives   Ciprofloxacin Other (See Comments)    Interaction w other medicine   Erythromycin Base Nausea And Vomiting   Sulfabenzamide Other (See Comments)    UNK reaction    Family History  Problem Relation Age of Onset   Congestive Heart Failure Mother    Coronary artery disease Father    Heart attack Father 75       cause of  death.   Diabetes Mellitus II Sister    Hypertension Sister    Breast cancer Sister    Migraines Neg Hx     Prior to Admission medications   Medication Sig Start Date End Date Taking? Authorizing Provider  acetaminophen (TYLENOL) 500 MG tablet Take 1,000 mg by mouth every 6 (six) hours as needed for mild pain.   Yes [provider]  aspirin 81 MG EC tablet Take 81 mg by mouth every other day. Swallow whole.   Yes [provider]  carvedilol (COREG) 3.125 MG tablet Take 1 tablet (3.125 mg total) by mouth 2 (two) times daily. 05/17/21  Yes  [name], MD  ezetimibe-simvastatin (VYTORIN) 10-80 MG tablet Take 1 tablet by mouth at bedtime.   Yes [provider]  famotidine (PEPCID) 40 MG tablet Take 40 mg by mouth daily.   Yes [provider]  ibuprofen (ADVIL) 200 MG tablet Take 600 mg by mouth 2 (two) times daily as needed.   Yes [provider]  lidocaine (XYLOCAINE) 5 % ointment Apply 1 application topically as needed. 06/30/21  Yes Long, Wonda Olds, MD  NURTEC 75 MG TBDP Take 1 tablet by mouth daily as needed for headache. 05/19/21  Yes [provider]  sertraline (ZOLOFT) 100 MG tablet Take 100 mg by mouth daily.   Yes [provider]  tiZANidine (ZANAFLEX) 4 MG capsule Take 4 mg by mouth at bedtime.   Yes [provider]  valsartan (DIOVAN) 320 MG tablet Take 320 mg by mouth daily.   Yes [provider]  hydrochlorothiazide (HYDRODIURIL) 25 MG tablet Take 25 mg by mouth daily as needed.    [provider]  VITAMIN D PO Take 1 tablet by mouth daily.    [provider]    Physical Exam: Vitals:   09/05/21 0843 09/05/21 1057 09/05/21 1246 09/05/21 1347  BP:  (!) 118/99 138/78 107/65  Pulse:  73 81 85  Resp:  14 17 18   Temp:   99.1 F (37.3 C) 98.4 F (36.9 C)  TempSrc:   Oral Oral  SpO2:  97% 98% 96%  Weight: 49.4 kg     Height: 4\' 8"  (1.422 m)      Physical Exam Constitutional:      General: She is awake.     Appearance: Normal appearance. She is normal weight.  HENT:     Head: Normocephalic.     Mouth/Throat:     Mouth: Mucous membranes are moist.  Eyes:     Pupils: Pupils are equal, round, and reactive to light.  Neck:     Vascular: No JVD.  Cardiovascular:     Rate and Rhythm: Normal rate and regular rhythm.  Pulmonary:     Effort: Pulmonary effort is normal.     Breath sounds: Normal breath sounds.  Abdominal:     General: Abdomen is flat. There is no distension.     Palpations: Abdomen is soft.     Tenderness:  There is no abdominal tenderness.  Musculoskeletal:     Cervical back: Neck supple.     Left hip: Tenderness present.     Right lower leg: No edema.     Left lower leg: No edema.     Comments: Good distal pulses.  Shortened and laterally rotated LLE.  Skin:    General: Skin is warm and dry.  Neurological:     General: No focal deficit present.     Mental Status: She  is alert and oriented to person, place, and time.     Data Reviewed:  There are no new results to review at this time.  Assessment and Plan: Principal Problem:   Hip fracture ([place]) Admit to MedSurg/inpatient. N.p.o. after midnight. Buck's traction and ice area per protocol. Oxycodone 5 mg every 4 hours as needed. Hydromorphone 0.5 mg IVP every 2 hr PRN severe pain. Senokot 1 tablet p.o. twice daily as needed. Milk of magnesia 30 mL daily as needed. Consult nutritional services and TOC. PT evaluation after surgery.  Active Problems:   History of sleep apnea Not on CPAP at bedtime.    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 Continue carvedilol 3.125 mg p.o. twice daily. Continue ezitimibe and simvastatin. Check preop EKG. Follow-up with cardiology as an outpatient.    Hyperlipidemia Continue Vytorin 10/80 mg p.o. daily at bedtime.    Hypertensive disorder Pending K level, continue valsartan or formulary equivalent. Monitor BP, renal function electrolytes.    Normocytic anemia Monitor hematocrit and hemoglobin. Transfuse as needed.    Hyperkalemia Recheck potassium level.     Advance Care Planning:   Code Status: Full Code   Consults: Orthopedic surgery (Dr. Lennette Bihari [place].  Family Communication:   Severity of Illness: The appropriate patient status for this patient is OBSERVATION. Observation status is judged to be reasonable and necessary in order to provide the required intensity of service to ensure the patient's safety. The patient's presenting  symptoms, physical exam findings, and initial radiographic and laboratory data in the context of their medical condition is felt to place them at decreased risk for further clinical deterioration. Furthermore, it is anticipated that the patient will be medically stable for discharge from the hospital within 2 midnights of admission.   Author: Reubin Milan, MD 09/05/2021 2:42 PM  For on call review [email or web address].   This document was prepared using Dragon voice recognition system and may contain some unintended transcription errors.

## 2021-09-05 NOTE — Plan of Care (Signed)
°  Problem: Education: Goal: Verbalization of understanding the information provided (i.e., activity precautions, restrictions, etc) will improve Outcome: Progressing   Problem: Pain Management: Goal: Pain level will decrease 09/05/2021 1933 by Lisabeth Pick, RN Outcome: Progressing 09/05/2021 1931 by Lisabeth Pick, RN Outcome: Progressing

## 2021-09-06 ENCOUNTER — Inpatient Hospital Stay (HOSPITAL_COMMUNITY): Payer: Medicare PPO

## 2021-09-06 ENCOUNTER — Encounter (HOSPITAL_COMMUNITY): Payer: Self-pay | Admitting: Internal Medicine

## 2021-09-06 ENCOUNTER — Inpatient Hospital Stay (HOSPITAL_COMMUNITY): Payer: Medicare PPO | Admitting: Anesthesiology

## 2021-09-06 ENCOUNTER — Encounter (HOSPITAL_COMMUNITY)
Admission: EM | Disposition: A | Discharge status: Home Health | Payer: Self-pay | Source: Home / Self Care | Attending: Internal Medicine

## 2021-09-06 DIAGNOSIS — M87851 Other osteonecrosis, right femur: Secondary | ICD-10-CM

## 2021-09-06 DIAGNOSIS — I2510 Atherosclerotic heart disease of native coronary artery without angina pectoris: Secondary | ICD-10-CM

## 2021-09-06 DIAGNOSIS — E875 Hyperkalemia: Secondary | ICD-10-CM

## 2021-09-06 DIAGNOSIS — S72002A Fracture of unspecified part of neck of left femur, initial encounter for closed fracture: Secondary | ICD-10-CM

## 2021-09-06 DIAGNOSIS — I252 Old myocardial infarction: Secondary | ICD-10-CM

## 2021-09-06 HISTORY — PX: TOTAL HIP ARTHROPLASTY: SHX124

## 2021-09-06 LAB — CBC
HCT: 31.3 % — ABNORMAL LOW (ref 36.0–46.0)
Hemoglobin: 10.1 g/dL — ABNORMAL LOW (ref 12.0–15.0)
MCH: 33.7 pg (ref 26.0–34.0)
MCHC: 32.3 g/dL (ref 30.0–36.0)
MCV: 104.3 fL — ABNORMAL HIGH (ref 80.0–100.0)
Platelets: 267 10*3/uL (ref 150–400)
RBC: 3 MIL/uL — ABNORMAL LOW (ref 3.87–5.11)
RDW: 13.9 % (ref 11.5–15.5)
WBC: 6.9 10*3/uL (ref 4.0–10.5)
nRBC: 0 % (ref 0.0–0.2)

## 2021-09-06 LAB — BASIC METABOLIC PANEL
Anion gap: 4 — ABNORMAL LOW (ref 5–15)
BUN: 24 mg/dL — ABNORMAL HIGH (ref 8–23)
CO2: 22 mmol/L (ref 22–32)
Calcium: 8.9 mg/dL (ref 8.9–10.3)
Chloride: 110 mmol/L (ref 98–111)
Creatinine, Ser: 0.9 mg/dL (ref 0.44–1.00)
GFR, Estimated: >60 mL/min (ref >60)
Glucose, Bld: 99 mg/dL (ref 70–99)
Potassium: 4.7 mmol/L (ref 3.5–5.1)
Sodium: 136 mmol/L (ref 135–145)

## 2021-09-06 IMAGING — DX DG HIP (WITH OR WITHOUT PELVIS) 1V*L*
1 series · 2 of 2 positions shown · non-contrast
Comparison: [DATE].

CLINICAL DATA: Status post left hip replacement.

EXAM:
DG HIP (WITH OR WITHOUT PELVIS) 1V*L*

[Series 1: pelvis ap · 0.14mm/px · 2 of 2 slices shown]
[im 1/2]
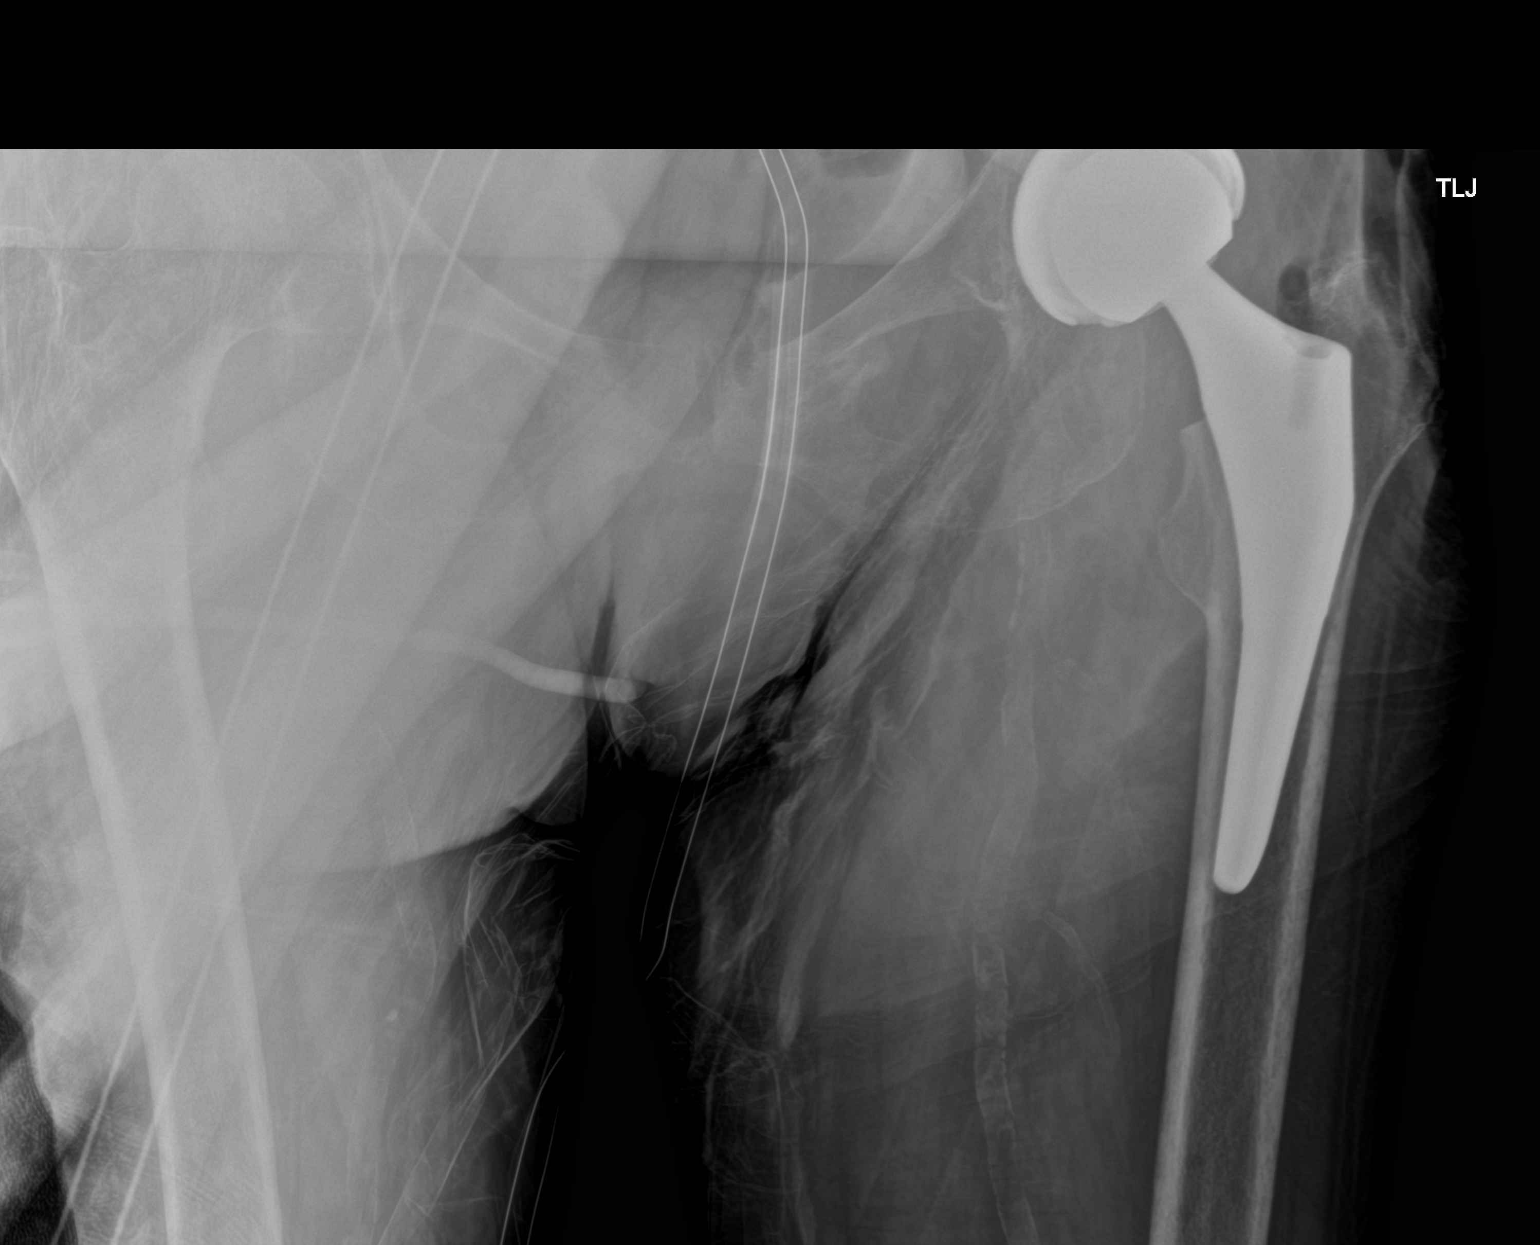
[im 2/2]
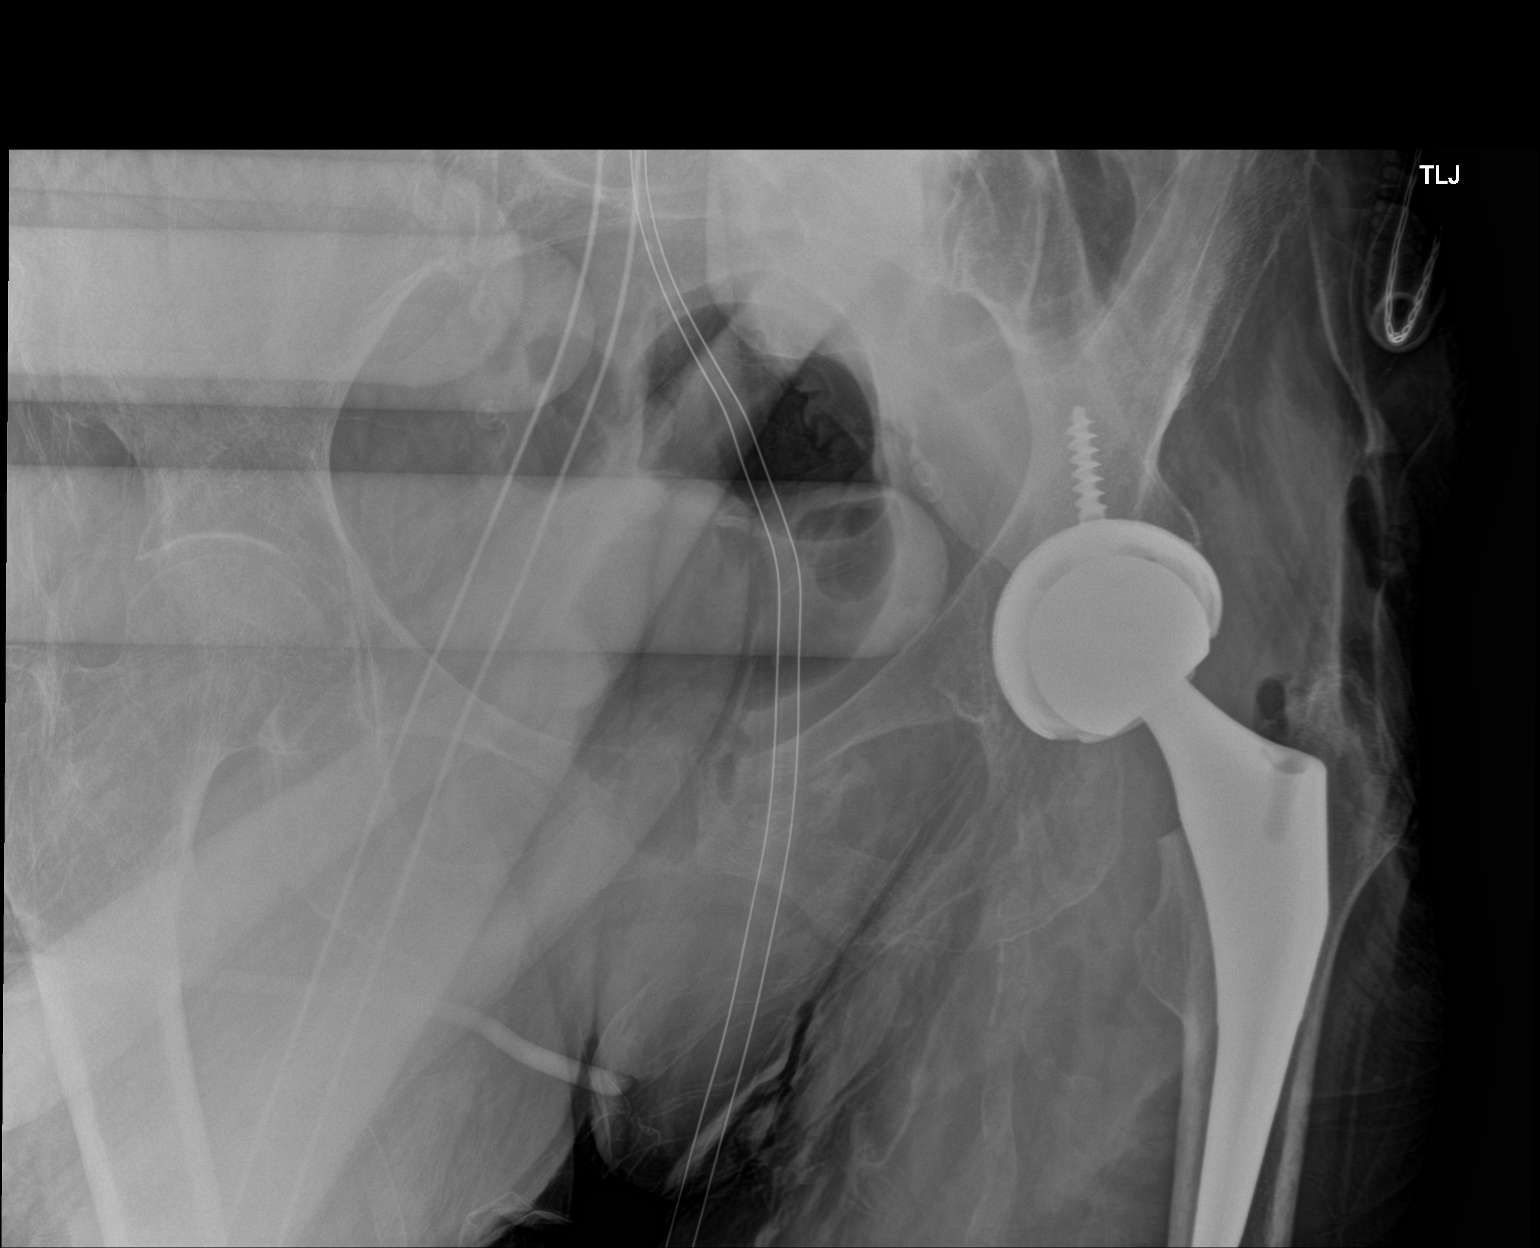

[2 of 2 positions shown; findings below may reference images not displayed]

FINDINGS: The left femoral and acetabular components are well situated.
Expected postoperative changes are seen in the surrounding soft
tissues.
IMPRESSION: Status post left total hip arthroplasty.

## 2021-09-06 IMAGING — CT CT HIP*L* W/O CM
2 of 6 series · 13 of 46 positions shown, 18 images · non-contrast
Comparison: Pelvis and left hip radiographs [DATE] (multiple
studies); [DATE]; MRI pelvis [DATE]

CLINICAL DATA: Fracture.  Evaluate superior pubic ramus fracture.

EXAM:
CT OF THE LEFT HIP WITHOUT CONTRAST
TECHNIQUE: Multidetector CT imaging of the left hip was performed according to
the standard protocol. Multiplanar CT image reconstructions were
also generated.
RADIATION DOSE REDUCTION: This exam was performed according to the
departmental dose-optimization program which includes automated
exposure control, adjustment of the mA and/or kV according to
patient size and/or use of iterative reconstruction technique.

[Series 3: axial (person_name) · axial · 0.44mm/px · z∈[-218,+16]mm · 10 of 135 slices shown, 15 images]
[im 9/135  soft-tissue]
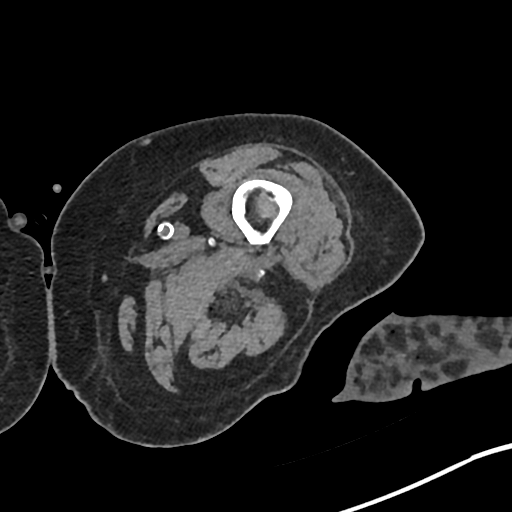
[im 9/135  bone]
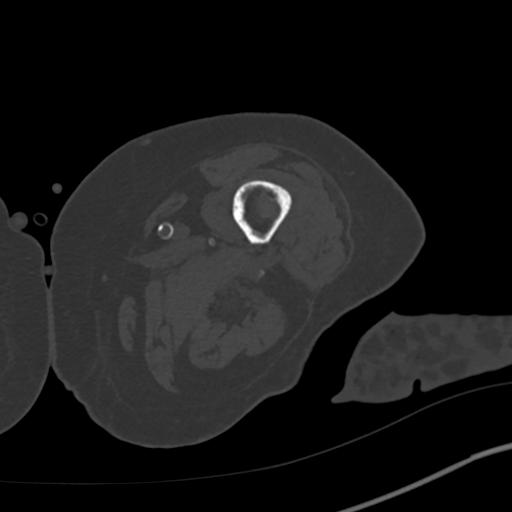
[im 27/135  soft-tissue]
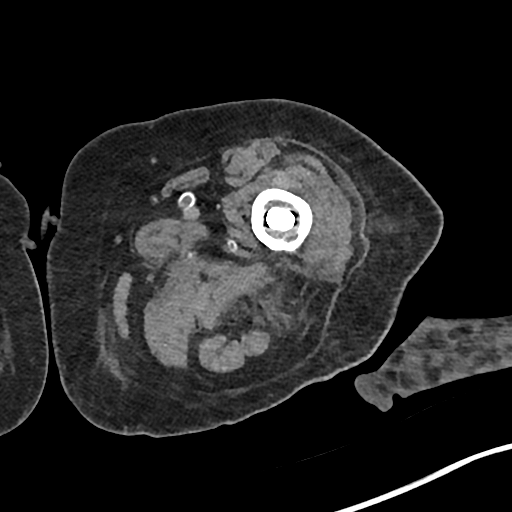
[im 36/135  soft-tissue]
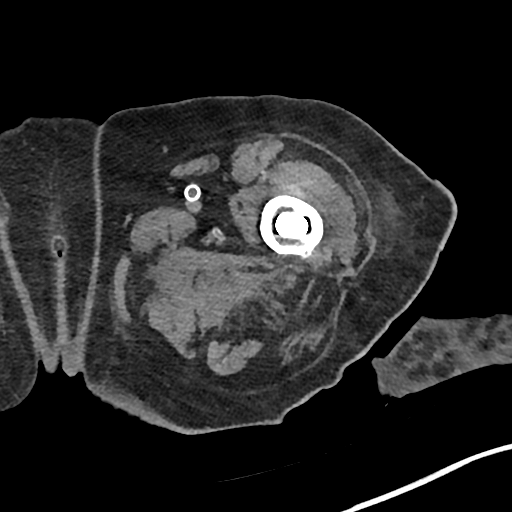
[im 54/135  soft-tissue]
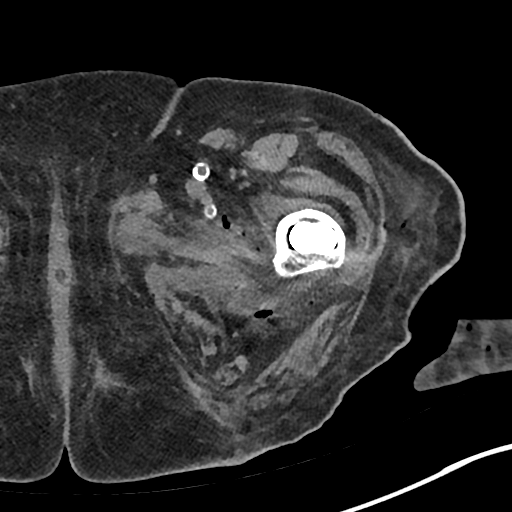
[im 72/135  soft-tissue]
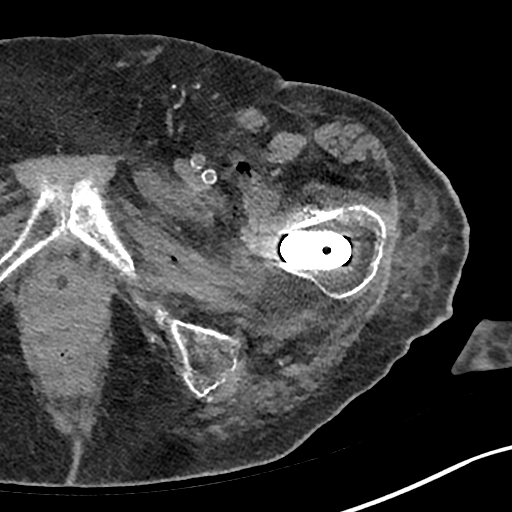
[im 81/135  soft-tissue]
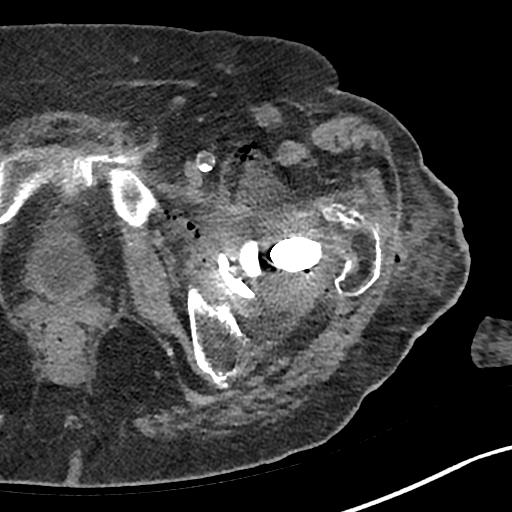
[im 99/135  soft-tissue]
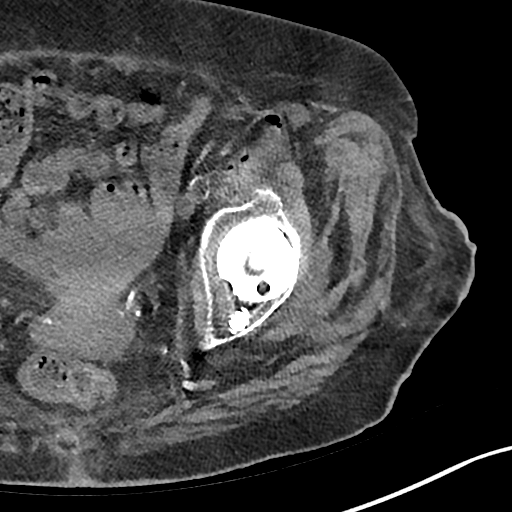
[im 99/135  lung]
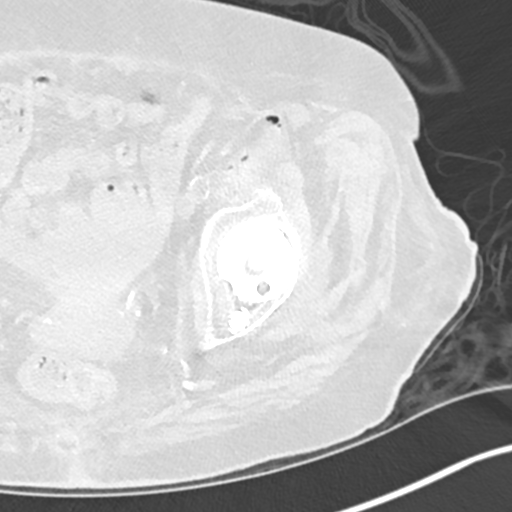
[im 108/135  soft-tissue]
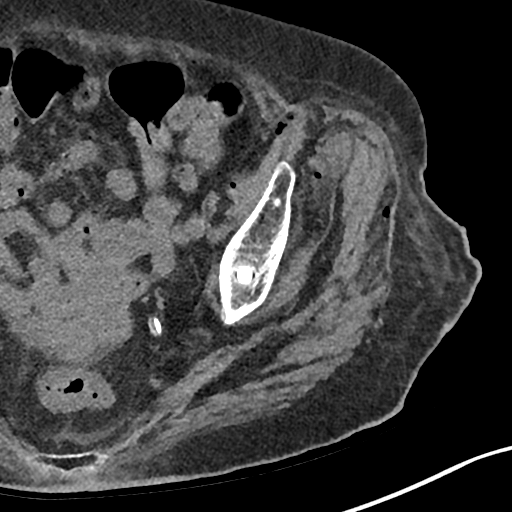
[im 108/135  lung]
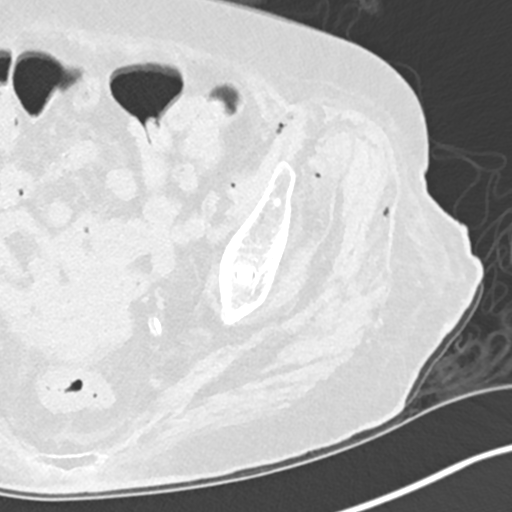
[im 117/135  lung]
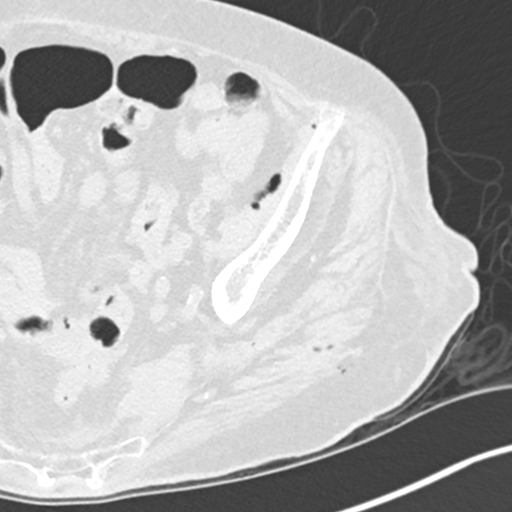
[im 126/135  soft-tissue]
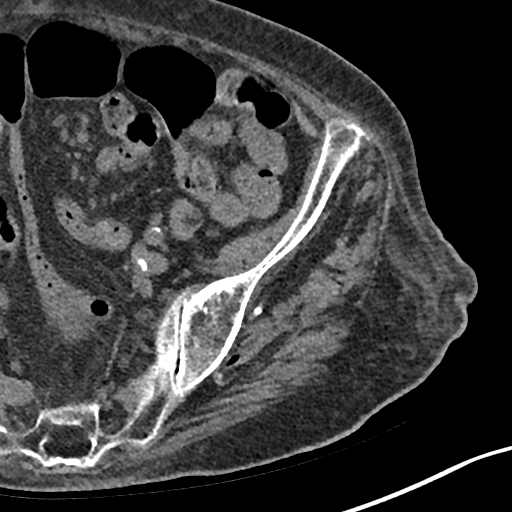
[im 126/135  lung]
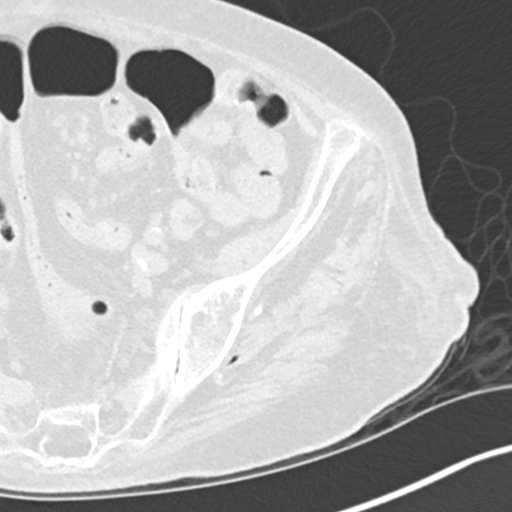
[im 126/135  bone]
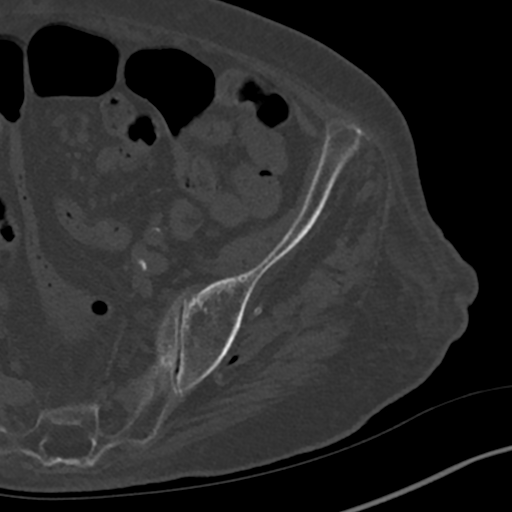

[Series 8: coronal (person_name) · coronal · 0.51mm/px · 3 of 104 slices shown]
[im 21/104  soft-tissue]
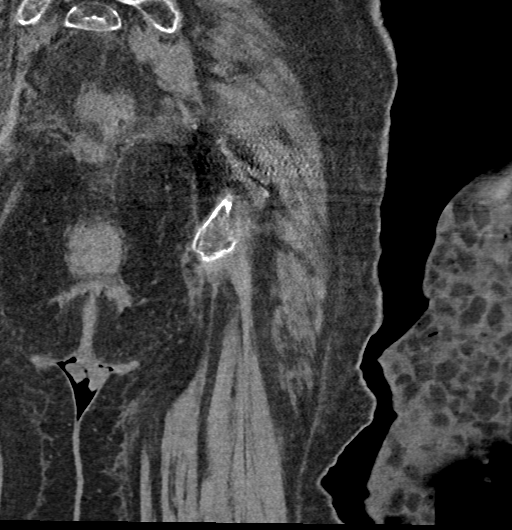
[im 42/104  soft-tissue]
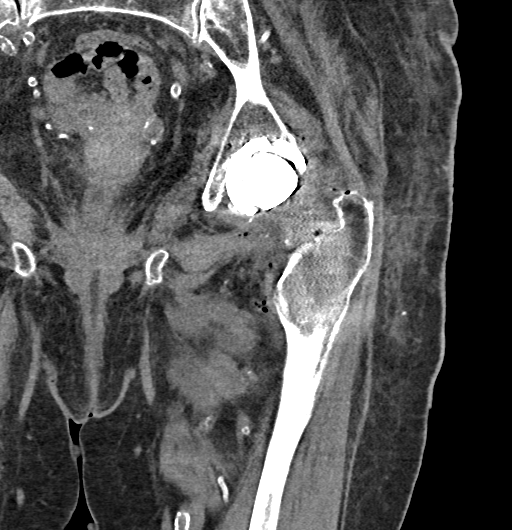
[im 62/104  soft-tissue]
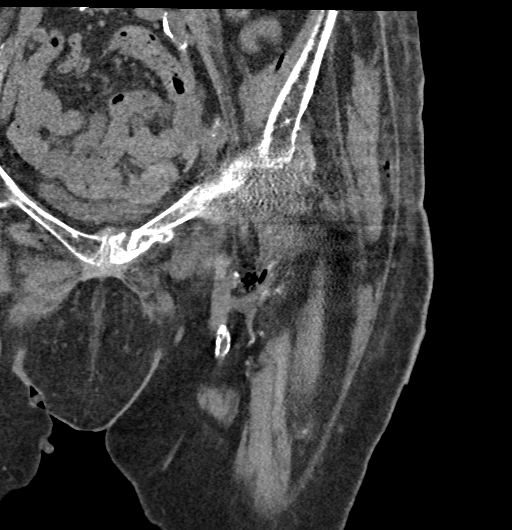

[13 of 46 positions shown; findings below may reference images not displayed]

FINDINGS: Bones/Joint/Cartilage

There is curvilinear lucency within the left superior pubic ramus
near the pubic body. Persistent fracture line lucency moderate
peripheral fracture border sclerosis suggesting a subacute process.
Note is made there may be a nondisplaced fracture on [DATE]
prior MRI. There is moderate healing sclerosis of the left inferior
pubic ramus nondisplaced subacute fracture.

Interval total left hip arthroplasty. No perihardware lucency is
seen to indicate hardware failure or loosening within limitations of
metallic streak artifact.

Mild partially visualized left sacroiliac joint space narrowing,
subchondral sclerosis and degenerative vacuum phenomenon.

Mild pubic symphysis joint space narrowing.

Ligaments

Suboptimally assessed by CT.

Muscles and Tendons

No significant muscle atrophy.

Soft tissues

Expected postoperative changes of recent total left hip arthroplasty
are seen with lateral left hip subcutaneous fat moderate edema and
swelling and scattered subcutaneous air. There is left
femoroacetabular joint space postoperative air that also mildly
tracts along the left iliopsoas muscle and tendon.

Moderate to high-grade atherosclerotic calcifications. A Foley
catheter is seen with[REDACTED]ompressed bladder.
IMPRESSION: :
IMPRESSION: 1. Status post recent total left hip arthroplasty. No evidence of
hardware failure.
2. Likely subacute left superior and inferior pubic ramus fractures.

## 2021-09-06 IMAGING — DX DG HIP (WITH OR WITHOUT PELVIS) 1V*L*
3 series · 3 of 3 positions shown · non-contrast
Comparison: Intraoperative hip radiographs obtained earlier the
same day, hip radiographs [DATE]

CLINICAL DATA: Status post left hip arthroplasty

EXAM:
DG HIP (WITH OR WITHOUT PELVIS) 1V*L*

[pelvis ap]
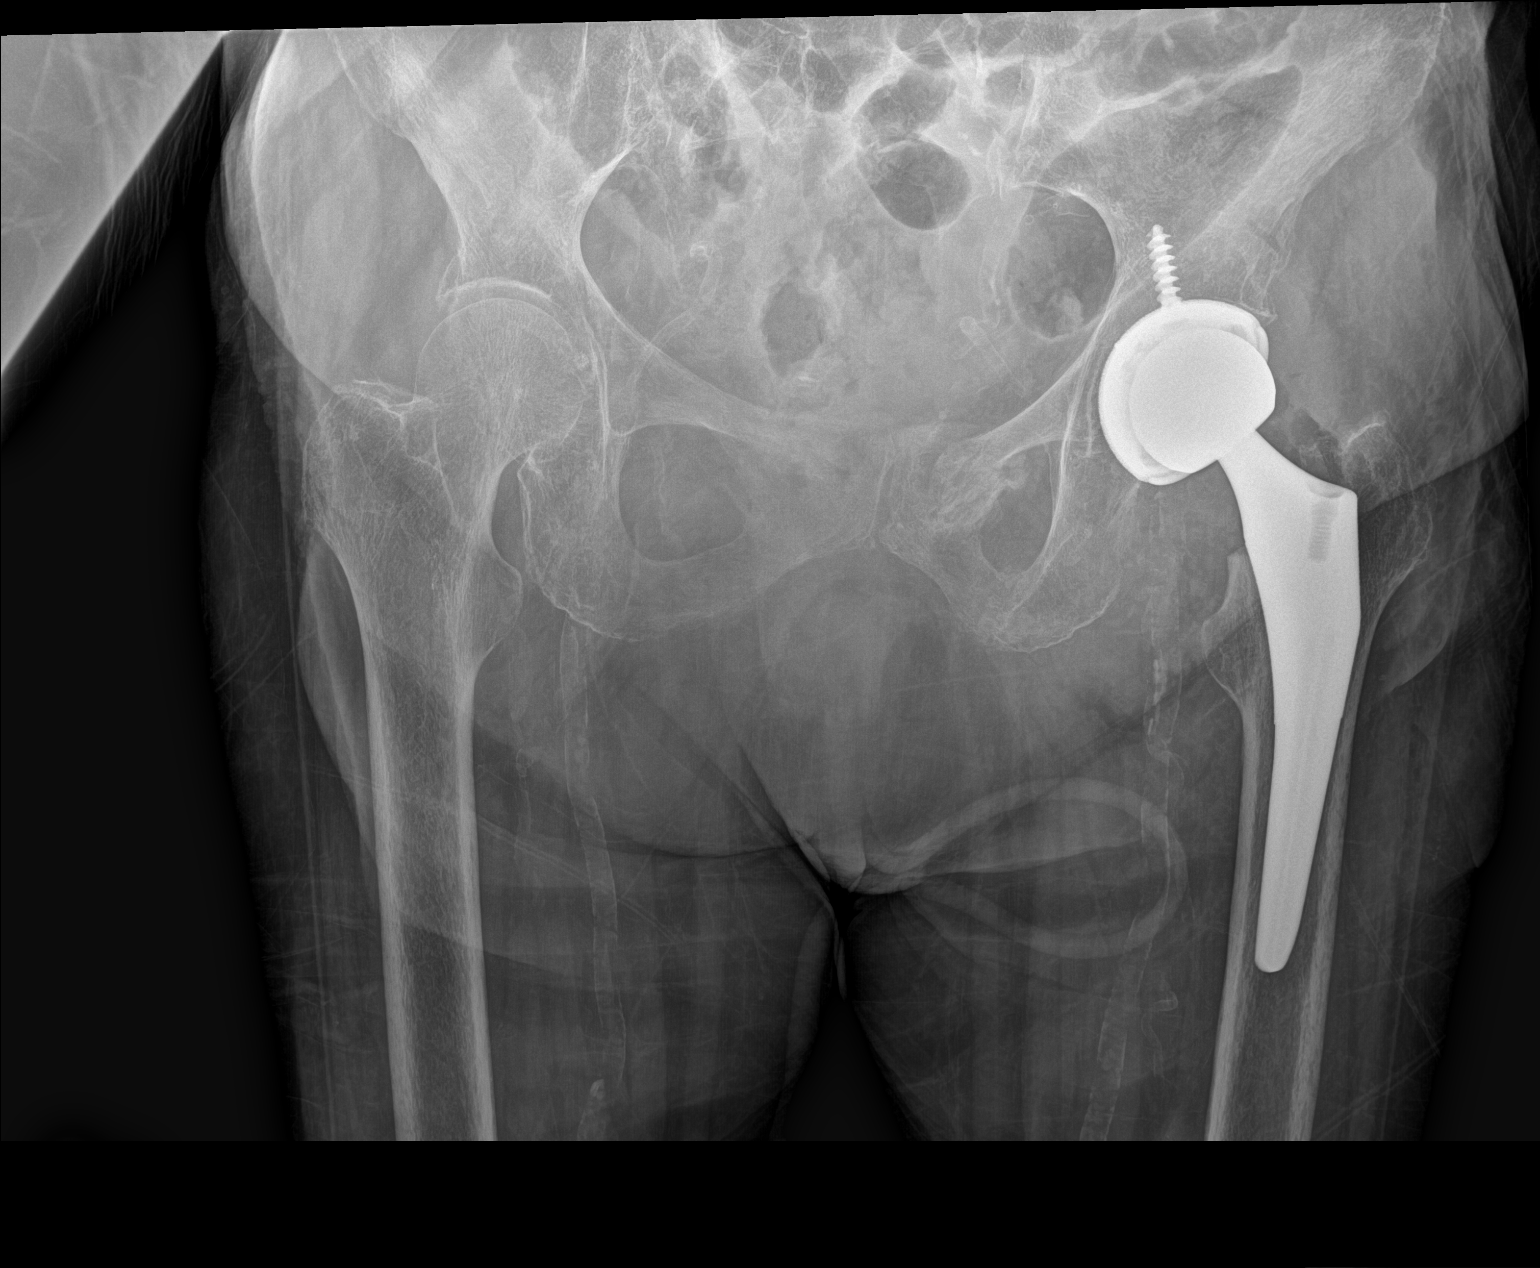

[hip ap]
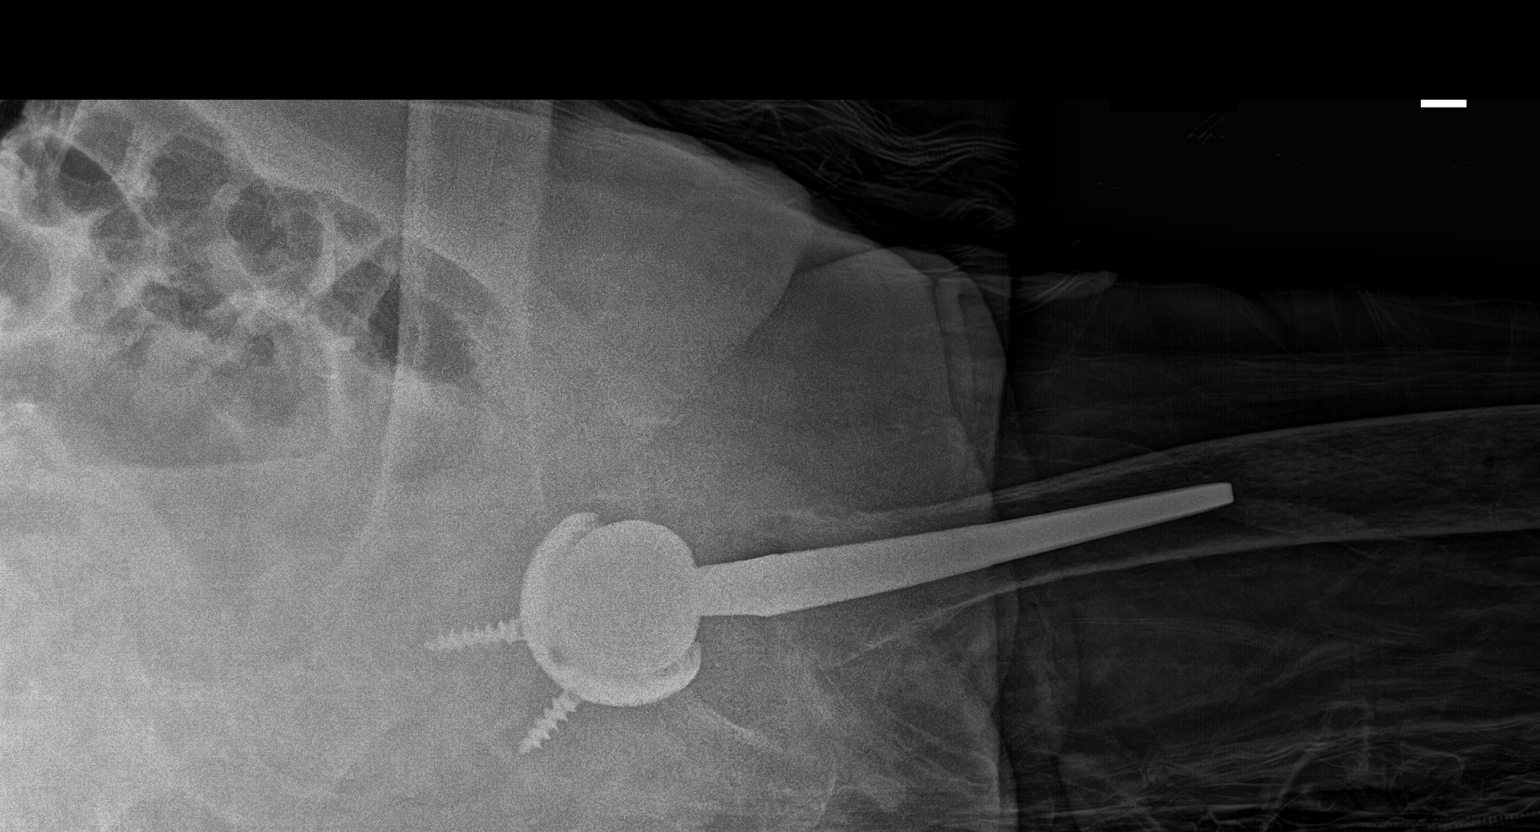

[hip lat]
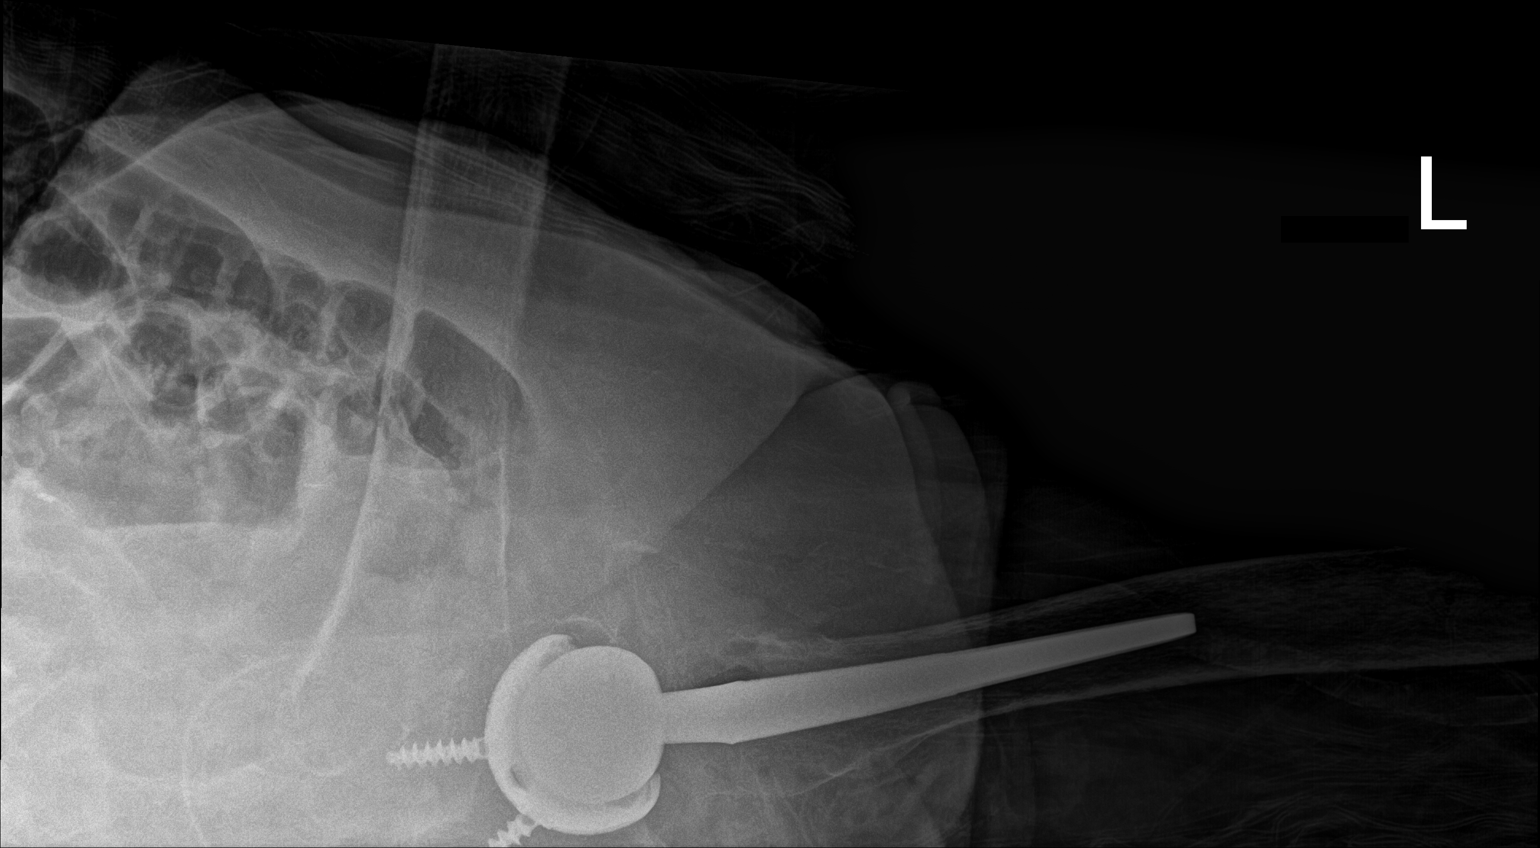

[3 of 3 positions shown; findings below may reference images not displayed]

FINDINGS: Postsurgical changes reflecting left hip arthroplasty are seen.
Hardware alignment is within expected limits, without evidence of
complication. There is expected surrounding postoperative soft
tissue gas. There is an additional fracture of the left superior
pubic ramus which was not definitely seen on the radiograph from
[DATE], and possible subacute fracture of the left inferior
pubic ramus.

Mild degenerative changes about the right hip are stable.
IMPRESSION: 1. Status post left hip arthroplasty without evidence of
complication.
2. Suspected additional fractures of the left superior and inferior
pubic rami, not seen on the prior radiographs from [DATE].
Recommend CT pelvis for further evaluation.

These results will be called to the ordering clinician or
representative by the Radiologist Assistant, and communication
documented in the PACS or [REDACTED].

## 2021-09-06 SURGERY — ARTHROPLASTY, HIP, TOTAL,POSTERIOR APPROACH
Anesthesia: General | Site: Hip | Laterality: Left

## 2021-09-06 MED ORDER — ENSURE SURGERY PO LIQD
237.0000 mL | Freq: Two times a day (BID) | ORAL | Status: DC
  Administered 2021-09-07 – 2021-09-09 (×3): 237 mL via ORAL

## 2021-09-06 MED ORDER — SODIUM CHLORIDE 0.9 % IR SOLN
Status: DC | PRN
  Administered 2021-09-06: 1000 mL

## 2021-09-06 MED ORDER — KETOROLAC TROMETHAMINE 15 MG/ML IJ SOLN
7.5000 mg | Freq: Three times a day (TID) | INTRAMUSCULAR | Status: AC
Start: 2021-09-06 — End: 2021-09-07
  Administered 2021-09-06 – 2021-09-07 (×3): 7.5 mg via INTRAVENOUS
  Filled 2021-09-06 (×2): qty 1

## 2021-09-06 MED ORDER — TRANEXAMIC ACID-NACL 1000-0.7 MG/100ML-% IV SOLN
1000.0000 mg | INTRAVENOUS | Status: AC
  Administered 2021-09-06: 1000 mg via INTRAVENOUS
  Filled 2021-09-06: qty 100

## 2021-09-06 MED ORDER — ONDANSETRON HCL 4 MG/2ML IJ SOLN
INTRAMUSCULAR | Status: AC
  Filled 2021-09-06: qty 2

## 2021-09-06 MED ORDER — LIDOCAINE 2% (20 MG/ML) 5 ML SYRINGE
INTRAMUSCULAR | Status: DC | PRN
  Administered 2021-09-06: 100 mg via INTRAVENOUS

## 2021-09-06 MED ORDER — POLYETHYLENE GLYCOL 3350 17 G PO PACK: 17.0000 g | PACK | Freq: Every day | ORAL | Status: DC

## 2021-09-06 MED ORDER — ISOPROPYL ALCOHOL 70 % SOLN
Status: DC | PRN
  Administered 2021-09-06: 1 via TOPICAL

## 2021-09-06 MED ORDER — CHLORHEXIDINE GLUCONATE 0.12 % MT SOLN
15.0000 mL | Freq: Once | OROMUCOSAL | Status: AC
  Administered 2021-09-06: 15 mL via OROMUCOSAL

## 2021-09-06 MED ORDER — PHENYLEPHRINE 40 MCG/ML (10ML) SYRINGE FOR IV PUSH (FOR BLOOD PRESSURE SUPPORT)
PREFILLED_SYRINGE | INTRAVENOUS | Status: AC
  Filled 2021-09-06: qty 10

## 2021-09-06 MED ORDER — ACETAMINOPHEN 10 MG/ML IV SOLN
1000.0000 mg | Freq: Once | INTRAVENOUS | Status: DC | PRN
  Administered 2021-09-06: 1000 mg via INTRAVENOUS

## 2021-09-06 MED ORDER — POVIDONE-IODINE 10 % EX SWAB
2.0000 "application " | Freq: Once | CUTANEOUS | Status: AC
  Administered 2021-09-06: 2 via TOPICAL

## 2021-09-06 MED ORDER — SURGIRINSE WOUND IRRIGATION SYSTEM - OPTIME
TOPICAL | Status: DC | PRN
  Administered 2021-09-06: 450 mL via TOPICAL

## 2021-09-06 MED ORDER — ONDANSETRON HCL 4 MG/2ML IJ SOLN: 4.0000 mg | Freq: Once | INTRAMUSCULAR | Status: DC | PRN

## 2021-09-06 MED ORDER — ROCURONIUM BROMIDE 10 MG/ML (PF) SYRINGE
PREFILLED_SYRINGE | INTRAVENOUS | Status: AC
  Filled 2021-09-06: qty 10

## 2021-09-06 MED ORDER — FENTANYL CITRATE (PF) 100 MCG/2ML IJ SOLN
INTRAMUSCULAR | Status: DC | PRN
  Administered 2021-09-06 (×2): 50 ug via INTRAVENOUS

## 2021-09-06 MED ORDER — LIDOCAINE HCL (PF) 2 % IJ SOLN
INTRAMUSCULAR | Status: AC
  Filled 2021-09-06: qty 5

## 2021-09-06 MED ORDER — HYDROMORPHONE HCL 1 MG/ML IJ SOLN: 0.2500 mg | INTRAMUSCULAR | Status: DC | PRN

## 2021-09-06 MED ORDER — ONDANSETRON HCL 4 MG/2ML IJ SOLN
INTRAMUSCULAR | Status: DC | PRN
  Administered 2021-09-06: 4 mg via INTRAVENOUS

## 2021-09-06 MED ORDER — DEXAMETHASONE SODIUM PHOSPHATE 10 MG/ML IJ SOLN
INTRAMUSCULAR | Status: AC
  Filled 2021-09-06: qty 1

## 2021-09-06 MED ORDER — CEFAZOLIN SODIUM-DEXTROSE 2-4 GM/100ML-% IV SOLN
2.0000 g | INTRAVENOUS | Status: AC
  Administered 2021-09-06: 2 g via INTRAVENOUS
  Filled 2021-09-06: qty 100

## 2021-09-06 MED ORDER — PROPOFOL 10 MG/ML IV BOLUS
INTRAVENOUS | Status: AC
Start: 2021-09-06 — End: ?
  Filled 2021-09-06: qty 20

## 2021-09-06 MED ORDER — BUPIVACAINE LIPOSOME 1.3 % IJ SUSP
INTRAMUSCULAR | Status: AC
  Filled 2021-09-06: qty 20

## 2021-09-06 MED ORDER — APIXABAN 2.5 MG PO TABS
2.5000 mg | ORAL_TABLET | Freq: Two times a day (BID) | ORAL | Status: DC
  Administered 2021-09-07 – 2021-09-10 (×7): 2.5 mg via ORAL
  Filled 2021-09-06 (×7): qty 1

## 2021-09-06 MED ORDER — BUPIVACAINE LIPOSOME 1.3 % IJ SUSP
INTRAMUSCULAR | Status: DC | PRN
  Administered 2021-09-06: 20 mL

## 2021-09-06 MED ORDER — ACETAMINOPHEN 10 MG/ML IV SOLN
INTRAVENOUS | Status: AC
  Filled 2021-09-06: qty 100

## 2021-09-06 MED ORDER — LACTATED RINGERS IV SOLN: INTRAVENOUS | Status: DC

## 2021-09-06 MED ORDER — WATER FOR IRRIGATION, STERILE IR SOLN
Status: DC | PRN
  Administered 2021-09-06: 2000 mL

## 2021-09-06 MED ORDER — PROPOFOL 10 MG/ML IV BOLUS
INTRAVENOUS | Status: DC | PRN
  Administered 2021-09-06: 130 mg via INTRAVENOUS

## 2021-09-06 MED ORDER — ROCURONIUM BROMIDE 10 MG/ML (PF) SYRINGE
PREFILLED_SYRINGE | INTRAVENOUS | Status: DC | PRN
  Administered 2021-09-06: 60 mg via INTRAVENOUS

## 2021-09-06 MED ORDER — SODIUM CHLORIDE (PF) 0.9 % IJ SOLN
INTRAMUSCULAR | Status: DC | PRN
  Administered 2021-09-06: 60 mL

## 2021-09-06 MED ORDER — 0.9 % SODIUM CHLORIDE (POUR BTL) OPTIME
TOPICAL | Status: DC | PRN
  Administered 2021-09-06: 1000 mL

## 2021-09-06 MED ORDER — KETOROLAC TROMETHAMINE 15 MG/ML IJ SOLN
INTRAMUSCULAR | Status: AC
  Filled 2021-09-06: qty 1

## 2021-09-06 MED ORDER — SODIUM CHLORIDE (PF) 0.9 % IJ SOLN
INTRAMUSCULAR | Status: AC
  Filled 2021-09-06: qty 10

## 2021-09-06 MED ORDER — FENTANYL CITRATE (PF) 100 MCG/2ML IJ SOLN
INTRAMUSCULAR | Status: AC
  Filled 2021-09-06: qty 2

## 2021-09-06 MED ORDER — PHENYLEPHRINE 40 MCG/ML (10ML) SYRINGE FOR IV PUSH (FOR BLOOD PRESSURE SUPPORT)
PREFILLED_SYRINGE | INTRAVENOUS | Status: DC | PRN
  Administered 2021-09-06 (×2): 120 ug via INTRAVENOUS
  Administered 2021-09-06 (×2): 80 ug via INTRAVENOUS

## 2021-09-06 MED ORDER — CEFAZOLIN SODIUM-DEXTROSE 2-4 GM/100ML-% IV SOLN
2.0000 g | Freq: Three times a day (TID) | INTRAVENOUS | Status: AC
  Administered 2021-09-06 – 2021-09-07 (×2): 2 g via INTRAVENOUS
  Filled 2021-09-06 (×2): qty 100

## 2021-09-06 MED ORDER — CHLORHEXIDINE GLUCONATE 4 % EX LIQD: 60.0000 mL | Freq: Once | CUTANEOUS | Status: DC

## 2021-09-06 MED ORDER — DEXAMETHASONE SODIUM PHOSPHATE 10 MG/ML IJ SOLN
INTRAMUSCULAR | Status: DC | PRN
  Administered 2021-09-06: 8 mg via INTRAVENOUS

## 2021-09-06 MED ORDER — SUGAMMADEX SODIUM 200 MG/2ML IV SOLN
INTRAVENOUS | Status: DC | PRN
  Administered 2021-09-06: 150 mg via INTRAVENOUS

## 2021-09-06 SURGICAL SUPPLY — 65 items
BAG COUNTER SPONGE SURGICOUNT (BAG) IMPLANT
BAG DECANTER FOR FLEXI CONT (MISCELLANEOUS) ×2 IMPLANT
BAG ZIPLOCK 12X15 (MISCELLANEOUS) ×2 IMPLANT
BLADE SAW SAG 25X90X1.19 (BLADE) IMPLANT
CHLORAPREP W/TINT 26 (MISCELLANEOUS) ×4 IMPLANT
COVER SURGICAL LIGHT HANDLE (MISCELLANEOUS) ×2 IMPLANT
DERMABOND ADVANCED (GAUZE/BANDAGES/DRESSINGS) ×1
DERMABOND ADVANCED .7 DNX12 (GAUZE/BANDAGES/DRESSINGS) ×1 IMPLANT
DRAPE 3/4 80X56 (DRAPES) ×4 IMPLANT
DRAPE HIP W/POCKET STRL (MISCELLANEOUS) ×2 IMPLANT
DRAPE INCISE IOBAN 66X45 STRL (DRAPES) ×2 IMPLANT
DRAPE INCISE IOBAN 85X60 (DRAPES) ×2 IMPLANT
DRAPE POUCH INSTRU U-SHP 10X18 (DRAPES) ×2 IMPLANT
DRAPE SURG 17X11 SM STRL (DRAPES) ×2 IMPLANT
DRAPE U-SHAPE 47X51 STRL (DRAPES) ×2 IMPLANT
DRESSING AQUACEL AG SP 3.5X10 (GAUZE/BANDAGES/DRESSINGS) ×1 IMPLANT
DRSG AQUACEL AG ADV 3.5X10 (GAUZE/BANDAGES/DRESSINGS) ×1 IMPLANT
DRSG AQUACEL AG SP 3.5X10 (GAUZE/BANDAGES/DRESSINGS) ×2
ELECT BLADE TIP CTD 4 INCH (ELECTRODE) ×2 IMPLANT
ELECT REM PT RETURN 15FT ADLT (MISCELLANEOUS) ×2 IMPLANT
GLOVE SRG 8 PF TXTR STRL LF DI (GLOVE) ×1 IMPLANT
GLOVE SURG ENC TEXT LTX SZ8 (GLOVE) ×4 IMPLANT
GLOVE SURG UNDER POLY LF SZ8 (GLOVE) ×2
GOWN STRL REUS W/TWL XL LVL3 (GOWN DISPOSABLE) ×2 IMPLANT
HANDPIECE INTERPULSE COAX TIP (DISPOSABLE)
HEAD CERAMIC FEMORAL 36MM (Head) ×1 IMPLANT
HOLDER FOLEY CATH W/STRAP (MISCELLANEOUS) ×2 IMPLANT
HOOD PEEL AWAY FLYTE STAYCOOL (MISCELLANEOUS) ×6 IMPLANT
INSERT 0 DEGREE 36 (Miscellaneous) ×1 IMPLANT
KIT BASIN OR (CUSTOM PROCEDURE TRAY) ×2 IMPLANT
KIT TURNOVER KIT A (KITS) IMPLANT
MANIFOLD NEPTUNE II (INSTRUMENTS) ×2 IMPLANT
MARKER SKIN DUAL TIP RULER LAB (MISCELLANEOUS) ×2 IMPLANT
NEEDLE HYPO 22GX1.5 SAFETY (NEEDLE) ×1 IMPLANT
NS IRRIG 1000ML POUR BTL (IV SOLUTION) ×2 IMPLANT
PACK TOTAL JOINT (CUSTOM PROCEDURE TRAY) ×2 IMPLANT
PROTECTOR NERVE ULNAR (MISCELLANEOUS) ×2 IMPLANT
RETRIEVER SUT HEWSON (MISCELLANEOUS) ×2 IMPLANT
SCREW HEX LP 6.5X20 (Screw) ×1 IMPLANT
SCREW HEX LP 6.5X25 (Screw) ×1 IMPLANT
SEALER BIPOLAR AQUA 6.0 (INSTRUMENTS) ×2 IMPLANT
SET HNDPC FAN SPRY TIP SCT (DISPOSABLE) IMPLANT
SHELL ACETAB TRIDENT 48 (Shell) ×1 IMPLANT
SLEEVE SCD COMPRESS KNEE MED (STOCKING) ×1 IMPLANT
SLIPPER XL GRAY (MISCELLANEOUS) ×1 IMPLANT
SPIKE FLUID TRANSFER (MISCELLANEOUS) ×3 IMPLANT
SPONGE T-LAP 18X18 ~~LOC~~+RFID (SPONGE) ×5 IMPLANT
STEM HIP 4 127DEG (Stem) ×1 IMPLANT
SUCTION FRAZIER HANDLE 12FR (TUBING) ×2
SUCTION TUBE FRAZIER 12FR DISP (TUBING) ×1 IMPLANT
SUT BONE WAX W31G (SUTURE) ×2 IMPLANT
SUT ETHIBOND #5 BRAIDED 30INL (SUTURE) ×2 IMPLANT
SUT MNCRL AB 3-0 PS2 18 (SUTURE) ×2 IMPLANT
SUT STRATAFIX 0 PDS 27 VIOLET (SUTURE) ×2
SUT STRATAFIX PDO 1 14 VIOLET (SUTURE) ×2
SUT STRATFX PDO 1 14 VIOLET (SUTURE) ×1
SUT VIC AB 2-0 CT2 27 (SUTURE) ×4 IMPLANT
SUTURE STRATFX 0 PDS 27 VIOLET (SUTURE) ×1 IMPLANT
SUTURE STRATFX PDO 1 14 VIOLET (SUTURE) ×1 IMPLANT
SYR 20ML LL LF (SYRINGE) ×4 IMPLANT
TOWEL OR 17X26 10 PK STRL BLUE (TOWEL DISPOSABLE) ×2 IMPLANT
TRAY FOLEY MTR SLVR 14FR STAT (SET/KITS/TRAYS/PACK) ×1 IMPLANT
TRAY FOLEY MTR SLVR 16FR STAT (SET/KITS/TRAYS/PACK) ×1 IMPLANT
TUBE SUCTION HIGH CAP CLEAR NV (SUCTIONS) ×1 IMPLANT
WATER STERILE IRR 1000ML POUR (IV SOLUTION) ×4 IMPLANT

## 2021-09-06 NOTE — Plan of Care (Signed)

## 2021-09-06 NOTE — Progress Notes (Signed)
° ° ° °  Subjective:  Patient in good spirits. Reports pain is much better now than it was before surgery. Eager to mobilize with PT. Denies distal n/t. Discussed post op xray findings with patient and plan to get CT scan to further evaluate the rami fx.  Objective:   VITALS:   Vitals:   09/06/21 1330 09/06/21 1400 09/06/21 1426 09/06/21 1608  BP: 138/60 130/89 (!) 147/87 (!) 145/74  Pulse: 64 64 67 71  Resp: _0 Temp:   98.2 F (36.8 C) 98.5 F (36.9 C)  TempSrc:   Oral Oral  SpO2: 96% 96% 96% 97%  Weight:      Height:        Sensation intact distally Intact pulses distally Dorsiflexion/Plantar flexion intact Incision: dressing C/D/I Compartment soft   Lab Results  Component Value Date   WBC 6.9 09/06/2021   HGB 10.1 (L) 09/06/2021   HCT 31.3 (L) 09/06/2021   MCV 104.3 (H) 09/06/2021   PLT 267 09/06/2021   BMET    Component Value Date/Time   NA 136 09/06/2021 0324   NA 142 12/08/2020 1056   K 4.7 09/06/2021 0324   CL 110 09/06/2021 0324   CO2 22 09/06/2021 0324   GLUCOSE 99 09/06/2021 0324   BUN 24 (H) 09/06/2021 0324   BUN 8 12/08/2020 1056   CREATININE 0.90 09/06/2021 0324   CALCIUM 8.9 09/06/2021 0324   EGFR 74 12/08/2020 1056   GFRNONAA >60 09/06/2021 0324      Xray: left total hip arthroplasty in good position, leg lengths restored, minimally displaced superior rami fracture on the left side visible.  Assessment/Plan: Day of Surgery   Principal Problem:   Hip fracture (HCC) Active Problems:   History of sleep apnea   Coronary arteriosclerosis in native artery   Hyperlipidemia   Hypertensive disorder   Normocytic anemia   Hyperkalemia  S/p left THA for femoral neck fracture 2/27  Post op xrays demonstrate what appears to be a superior rami fx with abnormal surrounding bone, will obtain CT scan to further evaluate, although difficult to assess from preop films the superior rami has abnomal appearance and feel this may be subacute  related to her recent fall. Anticipate likely nonop treatment and likely full WB pending CT scan results.  Post op recs: WB: WBAT LLE, posterior hip precautions x6 weeks Abx: ancef x23 hours post op Imaging: PACU pelvis Xray Dressing: Aquacell, keep intact until follow up DVT prophylaxis: eliquis 2.56m BID x4 weeks given cancer hx Follow up: 2 weeks after surgery for a wound check with Dr[facility]  [address] [place] [address] Office Phone: [phone]   DWillaim Sheng2/27/2023, 5:41 PM   DCharlies Constable MD  Contact information:   [phone]-5pm epic message Dr. MZachery Dakins or call office for patient follow up: [phone] After hours and holidays please check [email or web address] for group call information for [facility]

## 2021-09-06 NOTE — Transfer of Care (Signed)
Immediate Anesthesia Transfer of Care Note  Patient: Heather Garrett  Procedure(s) Performed: TOTAL HIP ARTHROPLASTY (Left: Hip)  Patient Location: PACU  Anesthesia Type:General  Level of Consciousness: awake, alert  and oriented  Airway & Oxygen Therapy: Patient Spontanous Breathing and Patient connected to face mask  Post-op Assessment: Report given to RN and Post -op Vital signs reviewed and stable  Post vital signs: Reviewed and stable  Last Vitals:  Vitals Value Taken Time  BP 151/110 09/06/21 1305  Temp    Pulse 77   Resp 12 09/06/21 1307  SpO2 100%   Vitals shown include unvalidated device data.  Last Pain:  Vitals:   09/06/21 0953  TempSrc:   PainSc: 6       Patients Stated Pain Goal: 1 (50/41/36 4383)  Complications: No notable events documented.

## 2021-09-06 NOTE — Progress Notes (Signed)
Initial Nutrition Assessment  INTERVENTION:   -Ensure Surgery PO BID, each provides 330 kcals and 18g protein   NUTRITION DIAGNOSIS:   Increased nutrient needs related to post-op healing, hip fracture as evidenced by estimated needs.  GOAL:   Patient will meet greater than or equal to 90% of their needs  MONITOR:   PO intake, Supplement acceptance, Labs, Weight trends, I & O's  REASON FOR ASSESSMENT:   Consult Hip fracture protocol  ASSESSMENT:   71 y.o. female past medical history significant for B12 deficiency abnormal LFTs migraine headaches transitional bladder carcinoma who had a fall about 2 weeks ago hitting her head but no loss of consciousness since then she has been having left hip pain and decreased range of motion left hip x-ray showed displaced fracture in the subcapital portion of the left femoral leg  Patient currently in OR for surgery on left hip fracture. NPO today. Pt consumed 25-90% of meals yesterday. Will order Ensure Surgery to aid in post-op healing.  Per weight records, pt's weight has been increasing.  Medications: Pepcid, Lactated ringers  Labs reviewed.  NUTRITION - FOCUSED PHYSICAL EXAM:  Unable to complete, working remotely.  Diet Order:   Diet Order             Diet NPO time specified  Diet effective ____           Diet NPO time specified  Diet effective midnight                   EDUCATION NEEDS:   Not appropriate for education at this time  Skin:  Skin Assessment: Reviewed RN Assessment  Last BM:  2/26  Height:   Ht Readings from Last 1 Encounters:  09/05/21 4\' 8"  (1.422 m)    Weight:   Wt Readings from Last 1 Encounters:  09/05/21 49.4 kg    BMI:  Body mass index is 24.44 kg/m.  Estimated Nutritional Needs:   Kcal:  1300-1500  Protein:  65-80g  Fluid:  1.5L/day  Clayton Bibles, MS, RD, LDN Inpatient Clinical Dietitian Contact information available via Amion

## 2021-09-06 NOTE — Interval H&P Note (Signed)
The patient has been re-examined, and the chart reviewed, and there have been no interval changes to the documented history and physical.    The operative side was examined and the patient was confirmed to have. Sens DPN, SPN, TN intact, Motor EHL, ext, flex 5/5, and DP 2+, PT 2+, No significant edema.   The risks, benefits, and alternatives have been discussed at length with patient, and the patient is willing to proceed.  Left hip marked. Consent has been signed.

## 2021-09-06 NOTE — Progress Notes (Signed)
[facility] PROGRESS NOTE    Progress Note  KENSY BLIZARD  OJJ:[id number] DOB: [birth year] DOA: 09/05/2021 PCP: [name], MD     Brief Narrative:   SEPHIRA ZELLMAN is an 71 y.o. female past medical history significant for B12 deficiency abnormal LFTs migraine headaches transitional bladder carcinoma who had a fall about 2 weeks ago hitting her head but no loss of consciousness since then she has been having left hip pain and decreased range of motion left hip x-ray showed displaced fracture in the subcapital portion of the left femoral leg orthopedic surgery was consulted    Assessment/Plan:   Left hip fracture ([place]) Placed n.p.o. orthopedic surgery was consulted. Continue analgesics for pain, will start her on MiraLAX p.o. twice daily to avoid constipation. Analgesics and anticoagulation per orthopedic surgery. Orthopedic surgery recommended total hip arthroplasty   History of sleep apnea Obstructive sleep apnea not on CPAP at night noted.  CAD/hyperlipidemia  Continue Coreg and statins.  essential hypertension: Continue Coreg, blood pressure seems to be relatively well controlled.  Normocytic anemia: Hemoglobin is stable at 10.1 Continue to monitor postoperatively.  Acute kidney injury: With a baseline creatinine of less than 1 likely prerenal azotemia resolved with IV fluid hydration  Hyperkalemia: Was given a single dose of Kayexalate this morning is 4.7.    DVT prophylaxis: lovenox Family Communication:none Status is: Inpatient Remains inpatient appropriate because: Acute left hip fracture that required surgical intervention      Code Status:     Code Status Orders  (From admission, onward)           Start     Ordered   09/05/21 1254  Full code  Continuous        09/05/21 1305           Code Status History     Date Active Date Inactive Code Status Order ID Comments User Context   09/05/2021 1305 09/05/2021 1305  Full Code [id number]  Reubin Milan, MD Inpatient         IV Access:   Peripheral IV   Procedures and diagnostic studies:   Chest Portable 1 View  Result Date: 09/05/2021 CLINICAL DATA:  Smoking history. Preoperative exam for hip surgery for left femoral neck fracture EXAM: PORTABLE CHEST 1 VIEW COMPARISON:  01/02/2021 FINDINGS: The heart size and mediastinal contours are within normal limits. Aortic atherosclerosis. Ovoid area of subtly increased density projecting over the mid left lung measuring up to 15 mm, indeterminate. 8 mm rounded calcification projects over the left lower lobe compatible with soft tissue calcification of the left chest wall seen on CT. Lungs are otherwise clear. No pleural effusion or pneumothorax. The visualized skeletal structures are unremarkable. IMPRESSION: 1. Ovoid area of subtly increased density projecting over the mid left lung, possibly representing nipple shadow, overlapping rib, versus pulmonary nodule. Recommend repeat PA and lateral radiographs of the chest with nipple markers to further evaluate. 2. Otherwise, no acute cardiopulmonary findings. Electronically Signed   By: Davina Poke D.O.   On: 09/05/2021 13:41   DG Hip Unilat W or Wo Pelvis 2-3 Views Left  Result Date: 09/05/2021 CLINICAL DATA:  Trauma, fall, pain EXAM: DG HIP (WITH OR WITHOUT PELVIS) 2-3V LEFT COMPARISON:  06/30/2021 FINDINGS: Fracture is seen in the subcapital portion of neck of left femur. There is overriding of fracture fragments. Flattening and sclerosis seen in the left femoral head suggests possible avascular necrosis. Arterial calcifications are seen in the soft tissues. IMPRESSION:  Displaced fracture is seen in the subcapital portion of neck of left femur. Electronically Signed   By: Elmer Picker M.D.   On: 09/05/2021 09:38     Medical Consultants:   None.   Subjective:    [name] she relates her pain is controlled.  Objective:     Vitals:   09/05/21 1551 09/05/21 2216 09/06/21 0110 09/06/21 0607  BP: 106/67 138/73 137/82 (!) 142/77  Pulse: 79 71 73 64  Resp: 17 18 18 18   Temp: 98.9 F (37.2 C) 97.7 F (36.5 C) 98.2 F (36.8 C) 99.1 F (37.3 C)  TempSrc: Oral Oral Oral Oral  SpO2: 97% 97% 98% 97%  Weight:      Height:       SpO2: 97 %   Intake/Output Summary (Last 24 hours) at 09/06/2021 0653 Last data filed at 09/06/2021 0400 Gross per 24 hour  Intake 820 ml  Output 1075 ml  Net -255 ml   Filed Weights   09/05/21 0843  Weight: 49.4 kg    Exam: General exam: In no acute distress. Respiratory system: Good air movement and clear to auscultation. Cardiovascular system: S1 & S2 heard, RRR. No JVD. Gastrointestinal system: Abdomen is nondistended, soft and nontender.  Extremities: No pedal edema. Skin: No rashes, lesions or ulcers Psychiatry: Judgement and insight appear normal. Mood & affect appropriate.    Data Reviewed:    Labs: Basic Metabolic Panel: Recent Labs  Lab 09/05/21 1105 09/05/21 1538 09/06/21 0324  NA 133* 134* 136  K 5.5* 5.6* 4.7  CL 103 107 110  CO2 19* 20* 22  GLUCOSE 89 101* 99  BUN 19 22 24*  CREATININE 0.98 1.10* 0.90  CALCIUM 9.1 8.7* 8.9  MG  --  2.4  --    GFR Estimated Creatinine Clearance: 37.6 mL/min (by C-G formula based on SCr of 0.9 mg/dL). Liver Function Tests: Recent Labs  Lab 09/05/21 1538  AST 22  ALT 17  ALKPHOS 282*  BILITOT 0.3  PROT 6.8  ALBUMIN 3.0*   No results for input(s): LIPASE, AMYLASE in the last 168 hours. No results for input(s): AMMONIA in the last 168 hours. Coagulation profile No results for input(s): INR, PROTIME in the last 168 hours. COVID-19 Labs  No results for input(s): DDIMER, FERRITIN, LDH, CRP in the last 72 hours.  Lab Results  Component Value Date   Vander NEGATIVE 09/05/2021    CBC: Recent Labs  Lab 09/05/21 1105 09/06/21 0324  WBC 9.3 6.9  NEUTROABS 6.3  --   HGB 10.4* 10.1*  HCT  32.4* 31.3*  MCV 100.6* 104.3*  PLT 287 267   Cardiac Enzymes: No results for input(s): CKTOTAL, CKMB, CKMBINDEX, TROPONINI in the last 168 hours. BNP (last 3 results) Recent Labs    11/25/20 1219  PROBNP 1,702*   CBG: No results for input(s): GLUCAP in the last 168 hours. D-Dimer: No results for input(s): DDIMER in the last 72 hours. Hgb A1c: No results for input(s): HGBA1C in the last 72 hours. Lipid Profile: No results for input(s): CHOL, HDL, LDLCALC, TRIG, CHOLHDL, LDLDIRECT in the last 72 hours. Thyroid function studies: No results for input(s): TSH, T4TOTAL, T3FREE, THYROIDAB in the last 72 hours.  Invalid input(s): FREET3 Anemia work up: No results for input(s): VITAMINB12, FOLATE, FERRITIN, TIBC, IRON, RETICCTPCT in the last 72 hours. Sepsis Labs: Recent Labs  Lab 09/05/21 1105 09/06/21 0324  WBC 9.3 6.9   Microbiology Recent Results (from the past 240 hour(s))  Resp Panel by RT-PCR (Flu A&B, Covid) Nasopharyngeal Swab     Status: None   Collection Time: 09/05/21 11:05 AM   Specimen: Nasopharyngeal Swab; Nasopharyngeal(NP) swabs in vial transport medium  Result Value Ref Range Status   SARS Coronavirus 2 by RT PCR NEGATIVE NEGATIVE Final    Comment: (NOTE) SARS-CoV-2 target nucleic acids are NOT DETECTED.  The SARS-CoV-2 RNA is generally detectable in upper respiratory specimens during the acute phase of infection. The lowest concentration of SARS-CoV-2 viral copies this assay can detect is 138 copies/mL. A negative result does not preclude SARS-Cov-2 infection and should not be used as the sole basis for treatment or other patient management decisions. A negative result may occur with  improper specimen collection/handling, submission of specimen other than nasopharyngeal swab, presence of viral mutation(s) within the areas targeted by this assay, and inadequate number of viral copies(<138 copies/mL). A negative result must be combined with clinical  observations, patient history, and epidemiological information. The expected result is Negative.  Fact Sheet for Patients:  EntrepreneurPulse.com.au  Fact Sheet for Healthcare Providers:  IncredibleEmployment.be  This test is no t yet approved or cleared by the [place] FDA and  has been authorized for detection and/or diagnosis of SARS-CoV-2 by FDA under an Emergency Use Authorization (EUA). This EUA will remain  in effect (meaning this test can be used) for the duration of the COVID-19 declaration under Section 564(b)(1) of the Act, 21 U.S.C.section 360bbb-3(b)(1), unless the authorization is terminated  or revoked sooner.       Influenza A by PCR NEGATIVE NEGATIVE Final   Influenza B by PCR NEGATIVE NEGATIVE Final    Comment: (NOTE) The Xpert Xpress SARS-CoV-2/FLU/RSV plus assay is intended as an aid in the diagnosis of influenza from Nasopharyngeal swab specimens and should not be used as a sole basis for treatment. Nasal washings and aspirates are unacceptable for Xpert Xpress SARS-CoV-2/FLU/RSV testing.  Fact Sheet for Patients: EntrepreneurPulse.com.au  Fact Sheet for Healthcare Providers: IncredibleEmployment.be  This test is not yet approved or cleared by the [place] FDA and has been authorized for detection and/or diagnosis of SARS-CoV-2 by FDA under an Emergency Use Authorization (EUA). This EUA will remain in effect (meaning this test can be used) for the duration of the COVID-19 declaration under Section 564(b)(1) of the Act, 21 U.S.C. section 360bbb-3(b)(1), unless the authorization is terminated or revoked.  Performed at [facility], [address], [place], [place] [address]   Surgical pcr screen     Status: None   Collection Time: 09/05/21  1:32 PM   Specimen: Nasal Mucosa; Nasal Swab  Result Value Ref Range Status   MRSA, PCR NEGATIVE NEGATIVE Final    Staphylococcus aureus NEGATIVE NEGATIVE Final    Comment: (NOTE) The Xpert SA Assay (FDA approved for NASAL specimens in patients 5 years of age and older), is one component of a comprehensive surveillance program. It is not intended to diagnose infection nor to guide or monitor treatment. Performed at [facility], [place] [address] [place], [place] [address]      Medications:    aspirin EC  81 mg Oral QODAY   carvedilol  3.125 mg Oral BID   ezetimibe  10 mg Oral QHS   And   simvastatin  80 mg Oral QHS   famotidine  20 mg Oral QHS   lidocaine  1 patch Transdermal Q24H   sertraline  100 mg Oral Daily   Continuous Infusions:    LOS:  1 day   Charlynne Cousins  [facility]  09/06/2021, 6:53 AM

## 2021-09-06 NOTE — Anesthesia Preprocedure Evaluation (Addendum)
Anesthesia Evaluation  Patient identified by MRN, date of birth, ID band Patient awake    Reviewed: Allergy & Precautions, NPO status , Patient's Chart, lab work & pertinent test results  Airway Mallampati: III  TM Distance: <3 FB Neck ROM: Full    Dental no notable dental hx.    Pulmonary neg pulmonary ROS, former smoker,    Pulmonary exam normal breath sounds clear to auscultation       Cardiovascular hypertension, + CAD and + Past MI  Normal cardiovascular exam Rhythm:Regular Rate:Normal     Neuro/Psych negative neurological ROS  negative psych ROS   GI/Hepatic negative GI ROS, (+)     substance abuse  alcohol use,   Endo/Other  negative endocrine ROS  Renal/GU negative Renal ROS  negative genitourinary   Musculoskeletal negative musculoskeletal ROS (+)   Abdominal   Peds negative pediatric ROS (+)  Hematology negative hematology ROS (+)   Anesthesia Other Findings   Reproductive/Obstetrics negative OB ROS                           Anesthesia Physical Anesthesia Plan  ASA: 3  Anesthesia Plan: General   Post-op Pain Management: Dilaudid IV   Induction: Intravenous  PONV Risk Score and Plan: 3 and Ondansetron, Dexamethasone and Treatment may vary due to age or medical condition  Airway Management Planned: Oral ETT  Additional Equipment:   Intra-op Plan:   Post-operative Plan: Extubation in OR  Informed Consent: I have reviewed the patients History and Physical, chart, labs and discussed the procedure including the risks, benefits and alternatives for the proposed anesthesia with the patient or authorized representative who has indicated his/her understanding and acceptance.     Dental advisory given  Plan Discussed with: CRNA and Surgeon  Anesthesia Plan Comments:         Anesthesia Quick Evaluation

## 2021-09-06 NOTE — Discharge Instructions (Addendum)
INSTRUCTIONS AFTER JOINT REPLACEMENT  ° °Remove items at home which could result in a fall. This includes throw rugs or furniture in walking pathways °ICE to the affected joint every three hours while awake for 30 minutes at a time, for at least the first 3-5 days, and then as needed for pain and swelling.  Continue to use ice for pain and swelling. You may notice swelling that will progress down to the foot and ankle.  This is normal after surgery.  Elevate your leg when you are not up walking on it.   °Continue to use the breathing machine you got in the hospital (incentive spirometer) which will help keep your temperature down.  It is common for your temperature to cycle up and down following surgery, especially at night when you are not up moving around and exerting yourself.  The breathing machine keeps your lungs expanded and your temperature down. ° ° °DIET:  As you were doing prior to hospitalization, we recommend a well-balanced diet. ° °DRESSING / WOUND CARE / SHOWERING ° °Keep the surgical dressing until follow up.  The dressing is water proof, so you can shower without any extra covering.  IF THE DRESSING FALLS OFF or the wound gets wet inside, change the dressing with sterile gauze.  Please use good hand washing techniques before changing the dressing.  Do not use any lotions or creams on the incision until instructed by your surgeon.   ° °ACTIVITY ° °Increase activity slowly as tolerated, but follow the weight bearing instructions below.   °No driving for 6 weeks or until further direction given by your physician.  You cannot drive while taking narcotics.  °No lifting or carrying greater than 10 lbs. until further directed by your surgeon. °Avoid periods of inactivity such as sitting longer than an hour when not asleep. This helps prevent blood clots.  °You may return to work once you are authorized by your doctor.  ° ° ° °WEIGHT BEARING  ° °Weight bearing as tolerated with assist device (walker, cane,  etc) as directed, use it as long as suggested by your surgeon or therapist, typically at least 4-6 weeks. ° ° °EXERCISES ° °Results after joint replacement surgery are often greatly improved when you follow the exercise, range of motion and muscle strengthening exercises prescribed by your doctor. Safety measures are also important to protect the joint from further injury. Any time any of these exercises cause you to have increased pain or swelling, decrease what you are doing until you are comfortable again and then slowly increase them. If you have problems or questions, call your caregiver or physical therapist for advice.  ° °Rehabilitation is important following a joint replacement. After just a few days of immobilization, the muscles of the leg can become weakened and shrink (atrophy).  These exercises are designed to build up the tone and strength of the thigh and leg muscles and to improve motion. Often times heat used for twenty to thirty minutes before working out will loosen up your tissues and help with improving the range of motion but do not use heat for the first two weeks following surgery (sometimes heat can increase post-operative swelling).  ° °These exercises can be done on a training (exercise) mat, on the floor, on a table or on a bed. Use whatever works the best and is most comfortable for you.    Use music or television while you are exercising so that the exercises are a pleasant break in your   day. This will make your life better with the exercises acting as a break in your routine that you can look forward to.   Perform all exercises about fifteen times, three times per day or as directed.  You should exercise both the operative leg and the other leg as well.  Exercises include:   Quad Sets - Tighten up the muscle on the front of the thigh (Quad) and hold for 5-10 seconds.   Straight Leg Raises - With your knee straight (if you were given a brace, keep it on), lift the leg to 60  degrees, hold for 3 seconds, and slowly lower the leg.  Perform this exercise against resistance later as your leg gets stronger.  Leg Slides: Lying on your back, slowly slide your foot toward your buttocks, bending your knee up off the floor (only go as far as is comfortable). Then slowly slide your foot back down until your leg is flat on the floor again.  Angel Wings: Lying on your back spread your legs to the side as far apart as you can without causing discomfort.  Hamstring Strength:  Lying on your back, push your heel against the floor with your leg straight by tightening up the muscles of your buttocks.  Repeat, but this time bend your knee to a comfortable angle, and push your heel against the floor.  You may put a pillow under the heel to make it more comfortable if necessary.   A rehabilitation program following joint replacement surgery can speed recovery and prevent re-injury in the future due to weakened muscles. Contact your doctor or a physical therapist for more information on knee rehabilitation.    CONSTIPATION  Constipation is defined medically as fewer than three stools per week and severe constipation as less than one stool per week.  Even if you have a regular bowel pattern at home, your normal regimen is likely to be disrupted due to multiple reasons following surgery.  Combination of anesthesia, postoperative narcotics, change in appetite and fluid intake all can affect your bowels.   YOU MUST use at least one of the following options; they are listed in order of increasing strength to get the job done.  They are all available over the counter, and you may need to use some, POSSIBLY even all of these options:    Drink plenty of fluids (prune juice may be helpful) and high fiber foods Colace 100 mg by mouth twice a day  Senokot for constipation as directed and as needed Dulcolax (bisacodyl), take with full glass of water  Miralax (polyethylene glycol) once or twice a day as  needed.  If you have tried all these things and are unable to have a bowel movement in the first 3-4 days after surgery call either your surgeon or your primary doctor.    If you experience loose stools or diarrhea, hold the medications until you stool forms back up.  If your symptoms do not get better within 1 week or if they get worse, check with your doctor.  If you experience "the worst abdominal pain ever" or develop nausea or vomiting, please contact the office immediately for further recommendations for treatment.   ITCHING:  If you experience itching with your medications, try taking only a single pain pill, or even half a pain pill at a time.  You can also use Benadryl over the counter for itching or also to help with sleep.   TED HOSE STOCKINGS:  Use stockings on both  legs until for at least 2 weeks or as directed by physician office. They may be removed at night for sleeping.  MEDICATIONS:  See your medication summary on the After Visit Summary that nursing will review with you.  You may have some home medications which will be placed on hold until you complete the course of blood thinner medication.  It is important for you to complete the blood thinner medication as prescribed.   Blood clot prevention (DVT Prophylaxis): After surgery you are at an increased risk for a blood clot. you were prescribed a blood thinner, Eliquis 2.5mg , to be taken twice daily for a total of 4 weeks from surgery to help reduce your risk of getting a blood clot. This will help prevent a blood clot. Signs of a pulmonary embolus (blood clot in the lungs) include sudden short of breath, feeling lightheaded or dizzy, chest pain with a deep breath, rapid pulse rapid breathing. Signs of a blood clot in your arms or legs include new unexplained swelling and cramping, warm, red or darkened skin around the painful area. Please call the office or 911 right away if these signs or symptoms develop.  Information on my  medicine - ELIQUIS (apixaban)  Why was Eliquis prescribed for you? Eliquis was prescribed for you to reduce the risk of blood clots forming after orthopedic surgery.    What do You need to know about Eliquis? Take your Eliquis TWICE DAILY - one tablet in the morning and one tablet in the evening with or without food.  It would be best to take the dose about the same time each day.  If you have difficulty swallowing the tablet whole please discuss with your pharmacist how to take the medication safely.  Take Eliquis exactly as prescribed by your doctor and DO NOT stop taking Eliquis without talking to the doctor who prescribed the medication.  Stopping without other medication to take the place of Eliquis may increase your risk of developing a clot.  After discharge, you should have regular check-up appointments with your healthcare provider that is prescribing your Eliquis.  What do you do if you miss a dose? If a dose of ELIQUIS is not taken at the scheduled time, take it as soon as possible on the same day and twice-daily administration should be resumed.  The dose should not be doubled to make up for a missed dose.  Do not take more than one tablet of ELIQUIS at the same time.  Important Safety Information A possible side effect of Eliquis is bleeding. You should call your healthcare provider right away if you experience any of the following: Bleeding from an injury or your nose that does not stop. Unusual colored urine (red or dark brown) or unusual colored stools (red or black). Unusual bruising for unknown reasons. A serious fall or if you hit your head (even if there is no bleeding).  Some medicines may interact with Eliquis and might increase your risk of bleeding or clotting while on Eliquis. To help avoid this, consult your healthcare provider or pharmacist prior to using any new prescription or non-prescription medications, including herbals, vitamins, non-steroidal  anti-inflammatory drugs (NSAIDs) and supplements.  This website has more information on Eliquis (apixaban): http://[email or web address].com/eliquis/home   PRECAUTIONS:  If you experience chest pain or shortness of breath - call 911 immediately for transfer to the hospital emergency department.   If you develop a fever greater that 101 F, purulent drainage from wound, increased redness or drainage from  wound, foul odor from the wound/dressing, or calf pain - CONTACT YOUR SURGEON.                                                   FOLLOW-UP APPOINTMENTS:  If you do not already have a post-op appointment, please call the office for an appointment to be seen by your surgeon.  Guidelines for how soon to be seen are listed in your After Visit Summary, but are typically between 2-3 weeks after surgery.  OTHER INSTRUCTIONS:   POST-OPERATIVE OPIOID TAPER INSTRUCTIONS: It is important to wean off of your opioid medication as soon as possible. If you do not need pain medication after your surgery it is ok to stop day one. Opioids include: Codeine, Hydrocodone(Norco, Vicodin), Oxycodone(Percocet, oxycontin) and hydromorphone amongst others.  Long term and even short term use of opiods can cause: Increased pain response Dependence Constipation Depression Respiratory depression And more.  Withdrawal symptoms can include Flu like symptoms Nausea, vomiting And more Techniques to manage these symptoms Hydrate well Eat regular healthy meals Stay active Use relaxation techniques(deep breathing, meditating, yoga) Do Not substitute Alcohol to help with tapering If you have been on opioids for less than two weeks and do not have pain than it is ok to stop all together.  Plan to wean off of opioids This plan should start within one week post op of your joint replacement. Maintain the same interval or time between taking each dose and first decrease the dose.  Cut the total daily intake of opioids by one  tablet each day Next start to increase the time between doses. The last dose that should be eliminated is the evening dose.   MAKE SURE YOU:  Understand these instructions.  Get help right away if you are not doing well or get worse.    Thank you for letting us be a part of your medical care team.  It is a privilege we respect greatly.  We hope these instructions will help you stay on track for a fast and full recovery!

## 2021-09-06 NOTE — H&P (View-Only) (Signed)
ORTHOPAEDIC CONSULTATION  REQUESTING PHYSICIAN: Charlynne Cousins, MD  Chief Complaint: Left hip fracture  HPI: Heather Garrett is a 71 y.o. 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  She was pending clearances for left total hip arthroplasty.  She reports about 2 weeks ago she had a fall and developed increased pain in her lower back as well as the left groin area.  Yesterday when standing she felt something in her hip give and was unable to ambulate or weight-bear.  She was seen at [facility] where x-rays demonstrated a displaced femoral neck fracture.  She denies pain in other joints or extremities at this time.  Past Medical History:  Diagnosis Date   Biceps tendinitis    Cobalamin deficiency    Coronary arteriosclerosis in native artery    Elevated liver function tests    Hyperlipidemia    Migraine    Rotator cuff syndrome    Transitional cell carcinoma, bladder (HCC)    Vitamin D deficiency    Past Surgical History:  Procedure Laterality Date   BREAST SURGERY     CARDIAC CATHETERIZATION     CATARACT EXTRACTION     CHOLECYSTECTOMY  1979   DG  BONE DENSITY ([facility] HX)     ENDOMETRIAL ABLATION     GASTRIC BYPASS     MASTECTOMY     OTHER SURGICAL HISTORY     CANCER SURGERY   Social History   Socioeconomic History   Marital status: Married    Spouse name: Not on file   Number of children: Not on file   Years of education: Not on file   Highest education level: Not on file  Occupational History   Not on file  Tobacco Use   Smoking status: Former    Passive exposure: Never   Smokeless tobacco: Never   Tobacco comments:    Quit 30 yrs ago  Vaping Use   Vaping Use: Never used  Substance and Sexual Activity   Alcohol use: Not Currently    Alcohol/week: 14.0 standard drinks    Types: 14 Glasses of wine per week   Drug use: Never   Sexual activity: Not on file  Other Topics Concern   Not on file   Social History Narrative   Not on file   Social Determinants of Health   Financial Resource Strain: Not on file  Food Insecurity: Not on file  Transportation Needs: Not on file  Physical Activity: Not on file  Stress: Not on file  Social Connections: Not on file   Family History  Problem Relation Age of Onset   Congestive Heart Failure Mother    Coronary artery disease Father    Heart attack Father 51       cause of death.   Diabetes Mellitus II Sister    Hypertension Sister    Breast cancer Sister    Migraines Neg Hx    Allergies  Allergen Reactions   Contrast Media [Iodinated Contrast Media] Anaphylaxis and Hives   Ciprofloxacin Other (See Comments)    Interaction w other medicine   Erythromycin Base Nausea And Vomiting   Sulfabenzamide Other (See Comments)    UNK reaction     Positive ROS: All other systems have been reviewed and were otherwise negative with the exception of those mentioned in the HPI and as above.  Physical Exam: General: Alert, no acute distress Cardiovascular: No pedal edema  Respiratory: No cyanosis, no use of accessory musculature Skin: No lesions in the area of chief complaint Neurologic: Sensation intact distally Psychiatric: Patient is competent for consent with normal mood and affect  MUSCULOSKELETAL:  LLE No traumatic wounds, ecchymosis, or rash  Pain with any gentle manipulation of the left hip  No knee or ankle effusion  Intact knee extension, nontender about the distal thigh, knee, lower leg, and ankle  Sens DPN, SPN, TN intact  Motor EHL, ext, flex 5/5  DP 2+, No significant edema  RLE No traumatic wounds, ecchymosis, or rash  Nontender  No groin pain with log roll  No knee or ankle effusion  Knee stable to varus/ valgus stress  Sens DPN, SPN, TN intact  Motor EHL, ext, flex 5/5  DP 2+, PT 2+, No significant edema    IMAGING: X-rays left hip demonstrate displaced left femoral neck fracture  Assessment: Principal  Problem:   Hip fracture (HCC) Active Problems:   History of sleep apnea   Coronary arteriosclerosis in native artery   Hyperlipidemia   Hypertensive disorder   Normocytic anemia   Hyperkalemia   Left displaced femoral neck fracture  Plan:  Patient's imaging and x-rays consistent with a displaced left femoral neck fracture.  She has underlying avascular necrosis with femoral head collapse.  Feel she would benefit from total hip arthroplasty as opposed to hemiarthroplasty given the degenerative changes of the hip joint.The risks benefits and alternatives were discussed with the patient including but not limited to the risks of nonoperative treatment, versus surgical intervention including infection, bleeding, nerve injury, periprosthetic fracture, the need for revision surgery, dislocation, leg length discrepancy, blood clots, cardiopulmonary complications, morbidity, mortality, among others, and they were willing to proceed. NPO since midnight Admitted to medicine service for preoperative optimization, and perioperative medical management     Willaim Sheng, MD  Contact information:   Weekdays 7am-5pm epic message Dr. Zachery Dakins, or call office for patient follow up: [phone] After hours and holidays please check [email or web address] for group call information for [facility]

## 2021-09-06 NOTE — Op Note (Signed)
09/06/2021  12:28 PM  PATIENT:  Heather Garrett   MRN: [id number]  PRE-OPERATIVE DIAGNOSIS: Left femoral neck fracture with underlying avascular necrosis of the femoral head  POST-OPERATIVE DIAGNOSIS:  same  PROCEDURE:  Procedure(s): Left TOTAL HIP ARTHROPLASTY  PREOPERATIVE INDICATIONS:    FAIRY ASHLOCK is an 71 y.o. female who has a diagnosis of displaced left femoral neck fracture with underlying avascular necrosis of the femoral head and elected for surgical management after failing conservative treatment.  The risks benefits and alternatives were discussed with the patient including but not limited to the risks of nonoperative treatment, versus surgical intervention including infection, bleeding, nerve injury, periprosthetic fracture, the need for revision surgery, dislocation, leg length discrepancy, blood clots, cardiopulmonary complications, morbidity, mortality, among others, and they were willing to proceed.     OPERATIVE REPORT     SURGEON:  Charlies Constable, MD    ASSISTANT: Izola Price, RNFA, (Present throughout the entire procedure,  necessary for completion of procedure in a timely manner, assisting with retraction, instrumentation, and closure)     ANESTHESIA: General  ESTIMATED BLOOD LOSS: 944 cc    COMPLICATIONS:  None.     UNIQUE ASPECTS OF THE CASE: Avascular necrosis of the left femoral head specimen sent to pathology  COMPONENTS:   Stryker Trident 48 mm acetabular cup, 2 screws, neutral liner, 36+0 ceramic head  Implant Name Type Inv. Item Serial No. Manufacturer Lot No. LRB No. Used Action  SHELL ACETAB TRIDENT 48 - [id number] Shell SHELL ACETAB TRIDENT 48  [employer] [id number] A Left 1 Implanted  SCREW HEX LP 6.5X25 - [id number] Screw SCREW HEX LP 6.5X25  [employer] UDG Left 1 Implanted  SCREW HEX LP 6.5X20 - [id number] Screw SCREW HEX LP 6.5X20  [employer] UZWE Left 1 Implanted  INSERT 0 DEGREE 36 - [id number]  Miscellaneous INSERT 0 DEGREE 36  [employer] [id number] Left 1 Implanted  STEM HIP 4 127DEG - [id number] Stem STEM HIP 4 127DEG  [employer] [id number] Left 1 Implanted  HEAD CERAMIC FEMORAL 36MM - [id number] Head HEAD CERAMIC FEMORAL 36MM  [employer] [id number] Left 1 Implanted      PROCEDURE IN DETAIL:   The patient was met in the holding area and  identified.  The appropriate hip was identified and marked at the operative site.  The patient was then transported to the OR  and  placed under anesthesia.  At that point, the patient was  placed in the lateral decubitus position with the operative side up and  secured to the operating room table  and all bony prominences padded. A subaxillary role was also placed.    The operative lower extremity was prepped from the iliac crest to the distal leg.  Sterile draping was performed.  Time out was performed prior to incision.      A routine posterolateral approach was utilized via sharp dissection  carried down to the subcutaneous tissue.  Gross bleeders were Bovie coagulated.  The iliotibial band was identified and incised along the length of the skin incision through the glute max fascia.  Charnley retractor was placed with care to protect the sciatic nerve posteriorly.  With the hip internally rotated, the piriformis tendon was identified and released from the femoral insertion and tagged with a #5 Ethibond.  A capsulotomy was then performed off the femoral insertion and also tagged with a #5 Ethibond.    The femoral neck was exposed, and I resected the femoral neck based on preoperative templating  relative to the lesser trochanter.    I then exposed the deep acetabulum, cleared out any tissue including the ligamentum teres.  After adequate visualization, I excised the labrum.  I then started reaming with a 44 mm reamer, first medializing to the floor of the cotyloid fossa, and then in the position of the cup aiming towards the greater  sciatic notch, matching the version of the transverse acetabular ligament and tucked under the anterior wall. I reamed up to 48 mm reamer with good bony bed preparation and a 48 mm cup was chosen.  The real cup was then impacted into place.  Appropriate version and inclination was confirmed clinically matching their bony anatomy, and also with the use of the jig.  I placed 2 screws in the posterior superior quadrant to augment fixation.  Half inch curved osteotome was used to remove overhanging anterior osteophyte  A neutral liner was placed and impacted. It was confirmed to be appropriately seated and the acetabular retractors were removed.    I then prepared the proximal femur using the box cutter, Charnley awl, and then sequentially broached starting with 0 up to a size 4.  A trial broach, neck, and head was utilized, and I reduced the hip and it was found to have excellent stability.  There was no impingement with full extension and 90 degrees external rotation.  The hip was stable at the position of sleep and with 90 degrees flexion and 85 degrees of internal rotation.  Leg lengths were also clinically assessed in the lateral position and felt to be equal.  A final femoral prosthesis size 4 was selected. I then impacted the real femoral prosthesis into place.I again trialed and selected a 36 +0 ball. and I impacted the real head ball into place. The hip was then reduced and taken through a range of motion. There was no impingement with full extension and 90 degrees external rotation.  The hip was stable at the position of sleep and with 90 degrees flexion and 85degrees of internal rotation. Leg lengths were  again assessed and felt to be restored.  The posterior capsule was then closed with #5 Ethibond.  The piriformis was repaired through the base of the abductor tendon using a [place] suture passer.  I then irrigated the hip copiously again with pulse lavage. Periarticular injection was then  performed with Exparel.  Intraop flat plate xray was obtained and components were confirmed to be in good position without fracture. We repaired the fascia #1 barbed suture, followed by 0 barbed suture for the subcutaneous fat.  Skin was closed with 2-0 Vicryl and 3-0 Monocryl.  Dermabond and Aquacel dressing were applied. The patient was then awakened and returned to PACU in stable and satisfactory condition.  Leg lengths in the supine position were assessed and felt to be clinically equal. There were no complications.  Post op recs: WB: WBAT LLE, posterior hip precautions x6 weeks Abx: ancef x23 hours post op Imaging: PACU pelvis Xray Dressing: Aquacell, keep intact until follow up DVT prophylaxis: eliquis 2.43m BID x4 weeks given cancer hx Follow up: 2 weeks after surgery [facility]  [address]: [address] [place] [address] Office Phone: [phone]  DCharlies Constable MD Orthopedic Surgeon

## 2021-09-06 NOTE — Anesthesia Procedure Notes (Signed)
Procedure Name: Intubation Date/Time: 09/06/2021 10:49 AM Performed by: Genelle Bal, CRNA Pre-anesthesia Checklist: Patient identified, Emergency Drugs available, Suction available and Patient being monitored Patient Re-evaluated:Patient Re-evaluated prior to induction Oxygen Delivery Method: Circle system utilized Preoxygenation: Pre-oxygenation with 100% oxygen Induction Type: IV induction Ventilation: Mask ventilation without difficulty Laryngoscope Size: Miller and 2 Grade View: Grade I Tube type: Oral Tube size: 7.0 mm Number of attempts: 1 Airway Equipment and Method: Stylet and Oral airway Placement Confirmation: ETT inserted through vocal cords under direct vision, positive ETCO2 and breath sounds checked- equal and bilateral Secured at: 20 cm Tube secured with: Tape Dental Injury: Teeth and Oropharynx as per pre-operative assessment

## 2021-09-06 NOTE — Consult Note (Signed)
ORTHOPAEDIC CONSULTATION  REQUESTING PHYSICIAN: Charlynne Cousins, MD  Chief Complaint: Left hip fracture  HPI: Heather Garrett is a 71 y.o. 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  She was pending clearances for left total hip arthroplasty.  She reports about 2 weeks ago she had a fall and developed increased pain in her lower back as well as the left groin area.  Yesterday when standing she felt something in her hip give and was unable to ambulate or weight-bear.  She was seen at [facility] ER where x-rays demonstrated a displaced femoral neck fracture.  She denies pain in other joints or extremities at this time.  Past Medical History:  Diagnosis Date   Biceps tendinitis    Cobalamin deficiency    Coronary arteriosclerosis in native artery    Elevated liver function tests    Hyperlipidemia    Migraine    Rotator cuff syndrome    Transitional cell carcinoma, bladder (HCC)    Vitamin D deficiency    Past Surgical History:  Procedure Laterality Date   BREAST SURGERY     CARDIAC CATHETERIZATION     CATARACT EXTRACTION     CHOLECYSTECTOMY  1979   DG  BONE DENSITY ([facility] HX)     ENDOMETRIAL ABLATION     GASTRIC BYPASS     MASTECTOMY     OTHER SURGICAL HISTORY     CANCER SURGERY   Social History   Socioeconomic History   Marital status: Married    Spouse name: Not on file   Number of children: Not on file   Years of education: Not on file   Highest education level: Not on file  Occupational History   Not on file  Tobacco Use   Smoking status: Former    Passive exposure: Never   Smokeless tobacco: Never   Tobacco comments:    Quit 30 yrs ago  Vaping Use   Vaping Use: Never used  Substance and Sexual Activity   Alcohol use: Not Currently    Alcohol/week: 14.0 standard drinks    Types: 14 Glasses of wine per week   Drug use: Never   Sexual activity: Not on file  Other Topics Concern   Not on file   Social History Narrative   Not on file   Social Determinants of Health   Financial Resource Strain: Not on file  Food Insecurity: Not on file  Transportation Needs: Not on file  Physical Activity: Not on file  Stress: Not on file  Social Connections: Not on file   Family History  Problem Relation Age of Onset   Congestive Heart Failure Mother    Coronary artery disease Father    Heart attack Father 66       cause of death.   Diabetes Mellitus II Sister    Hypertension Sister    Breast cancer Sister    Migraines Neg Hx    Allergies  Allergen Reactions   Contrast Media [Iodinated Contrast Media] Anaphylaxis and Hives   Ciprofloxacin Other (See Comments)    Interaction w other medicine   Erythromycin Base Nausea And Vomiting   Sulfabenzamide Other (See Comments)    UNK reaction     Positive ROS: All other systems have been reviewed and were otherwise negative with the exception of those mentioned in the HPI and as above.  Physical Exam: General: Alert, no acute distress Cardiovascular: No pedal edema  Respiratory: No cyanosis, no use of accessory musculature Skin: No lesions in the area of chief complaint Neurologic: Sensation intact distally Psychiatric: Patient is competent for consent with normal mood and affect  MUSCULOSKELETAL:  LLE No traumatic wounds, ecchymosis, or rash  Pain with any gentle manipulation of the left hip  No knee or ankle effusion  Intact knee extension, nontender about the distal thigh, knee, lower leg, and ankle  Sens DPN, SPN, TN intact  Motor EHL, ext, flex 5/5  DP 2+, No significant edema  RLE No traumatic wounds, ecchymosis, or rash  Nontender  No groin pain with log roll  No knee or ankle effusion  Knee stable to varus/ valgus stress  Sens DPN, SPN, TN intact  Motor EHL, ext, flex 5/5  DP 2+, PT 2+, No significant edema    IMAGING: X-rays left hip demonstrate displaced left femoral neck fracture  Assessment: Principal  Problem:   Hip fracture (HCC) Active Problems:   History of sleep apnea   Coronary arteriosclerosis in native artery   Hyperlipidemia   Hypertensive disorder   Normocytic anemia   Hyperkalemia   Left displaced femoral neck fracture  Plan:  Patient's imaging and x-rays consistent with a displaced left femoral neck fracture.  She has underlying avascular necrosis with femoral head collapse.  Feel she would benefit from total hip arthroplasty as opposed to hemiarthroplasty given the degenerative changes of the hip joint.The risks benefits and alternatives were discussed with the patient including but not limited to the risks of nonoperative treatment, versus surgical intervention including infection, bleeding, nerve injury, periprosthetic fracture, the need for revision surgery, dislocation, leg length discrepancy, blood clots, cardiopulmonary complications, morbidity, mortality, among others, and they were willing to proceed. NPO since midnight Admitted to medicine service for preoperative optimization, and perioperative medical management     Willaim Sheng, MD  Contact information:   Weekdays 7am-5pm epic message Dr. Zachery Dakins, or call office for patient follow up: [phone] After hours and holidays please check [email or web address] for group call information for [facility]

## 2021-09-06 NOTE — Progress Notes (Signed)
Orthopedic Tech Progress Note Patient Details:  Heather Garrett July 10, 1951 [id number]  Ortho Devices Ortho Device/Splint Location: trapeze bar Ortho Device/Splint Interventions: Application   Post Interventions Patient Tolerated: Well Instructions Provided: Care of device, Adjustment of device  Heather Garrett 09/06/2021, 1:02 PM

## 2021-09-07 ENCOUNTER — Encounter (HOSPITAL_COMMUNITY): Payer: Self-pay | Admitting: Orthopedic Surgery

## 2021-09-07 ENCOUNTER — Ambulatory Visit: Payer: Medicare PPO | Admitting: Pulmonary Disease

## 2021-09-07 DIAGNOSIS — E875 Hyperkalemia: Secondary | ICD-10-CM | POA: Diagnosis not present

## 2021-09-07 DIAGNOSIS — D649 Anemia, unspecified: Secondary | ICD-10-CM

## 2021-09-07 DIAGNOSIS — Z9889 Other specified postprocedural states: Secondary | ICD-10-CM

## 2021-09-07 DIAGNOSIS — S72002A Fracture of unspecified part of neck of left femur, initial encounter for closed fracture: Secondary | ICD-10-CM | POA: Diagnosis not present

## 2021-09-07 LAB — CBC
HCT: 30.7 % — ABNORMAL LOW (ref 36.0–46.0)
Hemoglobin: 10 g/dL — ABNORMAL LOW (ref 12.0–15.0)
MCH: 33.4 pg (ref 26.0–34.0)
MCHC: 32.6 g/dL (ref 30.0–36.0)
MCV: 102.7 fL — ABNORMAL HIGH (ref 80.0–100.0)
Platelets: 300 10*3/uL (ref 150–400)
RBC: 2.99 MIL/uL — ABNORMAL LOW (ref 3.87–5.11)
RDW: 13.7 % (ref 11.5–15.5)
WBC: 11.9 10*3/uL — ABNORMAL HIGH (ref 4.0–10.5)
nRBC: 0 % (ref 0.0–0.2)

## 2021-09-07 LAB — BASIC METABOLIC PANEL
Anion gap: 9 (ref 5–15)
BUN: 24 mg/dL — ABNORMAL HIGH (ref 8–23)
CO2: 22 mmol/L (ref 22–32)
Calcium: 8.8 mg/dL — ABNORMAL LOW (ref 8.9–10.3)
Chloride: 105 mmol/L (ref 98–111)
Creatinine, Ser: 0.83 mg/dL (ref 0.44–1.00)
GFR, Estimated: >60 mL/min (ref >60)
Glucose, Bld: 139 mg/dL — ABNORMAL HIGH (ref 70–99)
Potassium: 4.7 mmol/L (ref 3.5–5.1)
Sodium: 136 mmol/L (ref 135–145)

## 2021-09-07 MED ORDER — MAGIC MOUTHWASH
5.0000 mL | Freq: Four times a day (QID) | ORAL | Status: DC | PRN
  Administered 2021-09-07 – 2021-09-09 (×4): 5 mL via ORAL
  Filled 2021-09-07 (×7): qty 5

## 2021-09-07 MED ORDER — MELATONIN 3 MG PO TABS
3.0000 mg | ORAL_TABLET | Freq: Every evening | ORAL | Status: DC | PRN
  Administered 2021-09-07 – 2021-09-08 (×2): 3 mg via ORAL
  Filled 2021-09-07 (×2): qty 1

## 2021-09-07 MED ORDER — PHENOL 1.4 % MT LIQD: 1.0000 | OROMUCOSAL | Status: DC | PRN

## 2021-09-07 MED ORDER — POLYETHYLENE GLYCOL 3350 17 G PO PACK
17.0000 g | PACK | Freq: Two times a day (BID) | ORAL | Status: AC
  Administered 2021-09-07 – 2021-09-08 (×2): 17 g via ORAL
  Filled 2021-09-07 (×3): qty 1

## 2021-09-07 NOTE — TOC Initial Note (Signed)
Transition of Care [facility]) - Initial/Assessment Note   Patient Details  Name: Heather Garrett MRN: [id number] Date of Birth: 04/16/1951  Transition of [facility]) CM/SW Contact:    Sherie Don, LCSW Phone Number: 09/07/2021, 2:54 PM  Clinical Narrative: PT evaluation recommended HHPT. CSW met with patient and patient confirmed she would prefer to discharge home with HHPT rather than go to SNF. Patient agreeable to referral. Patient has a rolling walker at home, so no DME needs are anticipated at this time. CSW made HHPT referral to [facility] with [place], which was accepted.  Expected Discharge Plan: [place] Barriers to Discharge: Continued Medical Work up  Patient Goals and CMS Choice Patient states their goals for this hospitalization and ongoing recovery are:: Go home with [place] CMS Medicare.gov Compare Post Acute Care list provided to:: Patient Choice offered to / list presented to : Patient  Expected Discharge Plan and Services Expected Discharge Plan: [place] In-house Referral: Clinical Social Work Post Acute Care Choice: [place] arrangements for the past 2 months: Single Family Home            DME Arranged: N/A DME Agency: NA HH Arranged: PT HH Agency: [place] Date [place]: 09/07/21 Time [place]: 3295 Representative spoke with at [place]: [place] Arrangements/Services Living arrangements for the past 2 months: [place] with:: Spouse Patient language and need for interpreter reviewed:: Yes Do you feel safe going back to the place where you live?: Yes      Need for Family Participation in Patient Care: Yes (Comment) Care giver support system in place?: Yes (comment) Current home services: DME (Rolling walker) Criminal Activity/Legal Involvement Pertinent to Current Situation/Hospitalization: No - Comment as needed  Activities of Daily Living Home  Assistive Devices/Equipment: Environmental consultant (specify type), Eyeglasses ADL Screening (condition at time of admission) Patient's cognitive ability adequate to safely complete daily activities?: Yes Is the patient deaf or have difficulty hearing?: No Does the patient have difficulty seeing, even when wearing glasses/contacts?: No Does the patient have difficulty concentrating, remembering, or making decisions?: No Patient able to express need for assistance with ADLs?: Yes Does the patient have difficulty dressing or bathing?: No Independently performs ADLs?: Yes (appropriate for developmental age) Does the patient have difficulty walking or climbing stairs?: No Weakness of Legs: None Weakness of Arms/Hands: None  Permission Sought/Granted Permission sought to share information with : Other (comment) Permission granted to share information with : Yes, Verbal Permission Granted Permission granted to share info w AGENCY: [place] agencies  Emotional Assessment Appearance:: Appears stated age Attitude/Demeanor/Rapport: Engaged Affect (typically observed): Accepting Orientation: : Oriented to Self, Oriented to Place, Oriented to  Time, Oriented to Situation Alcohol / Substance Use: Not Applicable Psych Involvement: No (comment)  Admission diagnosis:  Hip fracture ([place]) [S72.009A] Displaced fracture of left femoral neck (HCC) [S72.002A] Patient Active Problem List   Diagnosis Date Noted   Hip fracture ([place]) 09/05/2021   Normocytic anemia 09/05/2021   Hyperkalemia 09/05/2021   Triggering of finger 05/17/2021   Heart disease 05/17/2021   Malignant tumor of breast ([place]) 05/17/2021   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[place]) 05/17/2021   Hypertensive disorder 05/17/2021   Lower extremity edema 05/14/2021   Benign paroxysmal positional vertigo of left ear 03/23/2021   Daytime somnolence 03/23/2021   History of sleep apnea 03/23/2021   Orthostatic hypotension 03/23/2021   Migraine without aura and  without  status migrainosus, not intractable 03/23/2021   Coronary arteriosclerosis in native artery 09/28/2016   Elevated LFTs 09/28/2016   Rotator cuff syndrome 09/28/2016   Biceps tendinitis 09/28/2016   Cobalamin deficiency 09/28/2016   Hyperlipidemia 09/28/2016   Migraine 09/28/2016   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[place]) 09/28/2016   Vitamin D deficiency 09/28/2016   PCP:  [name], MD Pharmacy:   [facility] [id number] - [address] [place] [address], [place] [address] Phone: [phone] Fax: [phone]  Readmission Risk Interventions No flowsheet data found.

## 2021-09-07 NOTE — Plan of Care (Signed)
Plan of care reviewed and discussed with the patient. 

## 2021-09-07 NOTE — Anesthesia Postprocedure Evaluation (Signed)
Anesthesia Post Note  Patient: Heather Garrett  Procedure(s) Performed: TOTAL HIP ARTHROPLASTY (Left: Hip)     Patient location during evaluation: PACU Anesthesia Type: General Level of consciousness: awake and alert Pain management: pain level controlled Vital Signs Assessment: post-procedure vital signs reviewed and stable Respiratory status: spontaneous breathing, nonlabored ventilation, respiratory function stable and patient connected to nasal cannula oxygen Cardiovascular status: blood pressure returned to baseline and stable Postop Assessment: no apparent nausea or vomiting Anesthetic complications: no   No notable events documented.  Last Vitals:  Vitals:   09/07/21 0234 09/07/21 0638  BP: 139/81 (!) 173/93  Pulse: 71 80  Resp: 18 20  Temp: 37.3 C (!) 38.2 C  SpO2: 98% 100%    Last Pain:  Vitals:   09/07/21 1014  TempSrc:   PainSc: 4                  Ilsa Bonello S

## 2021-09-07 NOTE — Progress Notes (Signed)
° ° ° °  Subjective:  Patient doing well this morning. Difficulty sleeping overnight. Pain well controlled. Again feeling much better than the pain she was having before surgery. Eager to work with PT today. Denies distal n/t. Also eager to go home. Hoping to have home health PT upon discharge.  Objective:   VITALS:   Vitals:   09/06/21 1426 09/06/21 1608 09/06/21 2336 09/07/21 0234  BP: (!) 147/87 (!) 145/74 122/75 139/81  Pulse: 67 71 66 71  Resp: 15 16 18 18   Temp: 98.2 F (36.8 C) 98.5 F (36.9 C) 99.1 F (37.3 C) 99.1 F (37.3 C)  TempSrc: Oral Oral Oral Oral  SpO2: 96% 97% 98% 98%  Weight:      Height:        Sensation intact distally Intact pulses distally Dorsiflexion/Plantar flexion intact Incision: dressing C/D/I Compartment soft   Lab Results  Component Value Date   WBC 6.9 09/06/2021   HGB 10.1 (L) 09/06/2021   HCT 31.3 (L) 09/06/2021   MCV 104.3 (H) 09/06/2021   PLT 267 09/06/2021   BMET    Component Value Date/Time   NA 136 09/06/2021 0324   NA 142 12/08/2020 1056   K 4.7 09/06/2021 0324   CL 110 09/06/2021 0324   CO2 22 09/06/2021 0324   GLUCOSE 99 09/06/2021 0324   BUN 24 (H) 09/06/2021 0324   BUN 8 12/08/2020 1056   CREATININE 0.90 09/06/2021 0324   CALCIUM 8.9 09/06/2021 0324   EGFR 74 12/08/2020 1056   GFRNONAA >60 09/06/2021 0324      Xray: left total hip arthroplasty in good position, leg lengths restored, minimally displaced superior rami fracture on the left side visible.  Assessment/Plan: 1 Day Post-Op   Principal Problem:   Hip fracture (HCC) Active Problems:   History of sleep apnea   Coronary arteriosclerosis in native artery   Hyperlipidemia   Hypertensive disorder   Normocytic anemia   Hyperkalemia  S/p left THA for femoral neck fracture 2/27  Subacute nondisplaced left superior pubic rami fx - confirmed on CT scan overnight. Stable fx. Okay for WBAT with walker.  Post op recs: WB: WBAT LLE, posterior hip  precautions x6 weeks Abx: ancef x23 hours post op Imaging: PACU pelvis Xray Dressing: Aquacell, keep intact until follow up DVT prophylaxis: eliquis 2.5mg  BID x4 weeks given cancer hx Follow up: 2 weeks after surgery for a wound check with Dr. Zachery Dakins at [facility].  Address: [address] [place], [place], [place] [address]  Office Phone: [phone]    Willaim Sheng 09/07/2021, 6:18 AM   Charlies Constable, MD  Contact information:   [phone] 7am-5pm epic message Dr. Zachery Dakins, or call office for patient follow up: [phone] After hours and holidays please check [email or web address] for group call information for [facility]

## 2021-09-07 NOTE — Progress Notes (Signed)
[facility] PROGRESS NOTE    Progress Note  LAILANY ENOCH  DTO:[id number] DOB: April 15, 1951 DOA: 09/05/2021 PCP: [name], MD     Brief Narrative:   AVIANNA MOYNAHAN is an 71 y.o. female past medical history significant for B12 deficiency abnormal LFTs migraine headaches transitional bladder carcinoma who had a fall about 2 weeks ago hitting her head but no loss of consciousness since then she has been having left hip pain and decreased range of motion left hip x-ray showed displaced fracture in the subcapital portion of the left femoral leg orthopedic surgery was consulted    Assessment/Plan:   Left hip fracture [facility]): Placed n.p.o. orthopedic surgery was consulted, who performed left total hip arthroplasty on 09/06/2021. Continue narcotics and Robaxin for pain. She was started on MiraLAX which she will continue Analgesics and anticoagulation per orthopedic surgery. Tmax of 100.7, with rising leukocytosis of 11.9 she was given cefazolin during surgical intervention. Physical therapy has been consulted she will probably need skilled nursing facility.  Left subacute superior and inferior pubic ramus fracture: Orthopedic surgery consulted recommended conservative management.  Continue pain control and physical therapy. Okay for walking with assistance with a walker  History of sleep apnea: Obstructive sleep apnea not on CPAP at night noted.  CAD/hyperlipidemia  Continue Coreg and statins.  Essential hypertension: Continue Coreg, blood pressure seems to be relatively well controlled.  Normocytic anemia: Hemoglobin is stable at 10.1 Continue to monitor postoperatively.  Acute kidney injury: Likely prerenal azotemia resolved with IV fluid resuscitation.  Hyperkalemia: Was given a single dose of Kayexalate and now resolved   DVT prophylaxis: lovenox Family Communication:none Status is: Inpatient Remains inpatient appropriate because: Acute left hip  fracture that required surgical intervention      Code Status:     Code Status Orders  (From admission, onward)           Start     Ordered   09/05/21 1254  Full code  Continuous        09/05/21 1305           Code Status History     Date Active Date Inactive Code Status Order ID Comments User Context   09/05/2021 1305 09/05/2021 1305 Full Code [id number]  Reubin Milan, MD Inpatient         IV Access:   Peripheral IV   Procedures and diagnostic studies:   CT HIP LEFT WO CONTRAST  Result Date: 09/06/2021 CLINICAL DATA:  Fracture.  Evaluate superior pubic ramus fracture. EXAM: CT OF THE LEFT HIP WITHOUT CONTRAST TECHNIQUE: Multidetector CT imaging of the left hip was performed according to the standard protocol. Multiplanar CT image reconstructions were also generated. RADIATION DOSE REDUCTION: This exam was performed according to the departmental dose-optimization program which includes automated exposure control, adjustment of the mA and/or kV according to patient size and/or use of iterative reconstruction technique. COMPARISON:  Pelvis and left hip radiographs 09/06/2021 (multiple studies); 09/05/2021; MRI pelvis 09-16-2021 FINDINGS: Bones/Joint/Cartilage There is curvilinear lucency within the left superior pubic ramus near the pubic body. Persistent fracture line lucency moderate peripheral fracture border sclerosis suggesting a subacute process. Note is made there may be a nondisplaced fracture on 2020-09-16 prior MRI. There is moderate healing sclerosis of the left inferior pubic ramus nondisplaced subacute fracture. Interval total left hip arthroplasty. No perihardware lucency is seen to indicate hardware failure or loosening within limitations of metallic streak artifact. Mild partially visualized left sacroiliac joint space narrowing, subchondral sclerosis  and degenerative vacuum phenomenon. Mild pubic symphysis joint space narrowing. Ligaments Suboptimally  assessed by CT. Muscles and Tendons No significant muscle atrophy. Soft tissues Expected postoperative changes of recent total left hip arthroplasty are seen with lateral left hip subcutaneous fat moderate edema and swelling and scattered subcutaneous air. There is left femoroacetabular joint space postoperative air that also mildly tracts along the left iliopsoas muscle and tendon. Moderate to high-grade atherosclerotic calcifications. A Foley catheter is seen within decompressed bladder. IMPRESSION:: IMPRESSION: 1. Status post recent total left hip arthroplasty. No evidence of hardware failure. 2. Likely subacute left superior and inferior pubic ramus fractures. Electronically Signed   By: Yvonne Kendall M.D.   On: 09/06/2021 19:55   Chest Portable 1 View  Result Date: 09/05/2021 CLINICAL DATA:  Smoking history. Preoperative exam for hip surgery for left femoral neck fracture EXAM: PORTABLE CHEST 1 VIEW COMPARISON:  01/02/2021 FINDINGS: The heart size and mediastinal contours are within normal limits. Aortic atherosclerosis. Ovoid area of subtly increased density projecting over the mid left lung measuring up to 15 mm, indeterminate. 8 mm rounded calcification projects over the left lower lobe compatible with soft tissue calcification of the left chest wall seen on CT. Lungs are otherwise clear. No pleural effusion or pneumothorax. The visualized skeletal structures are unremarkable. IMPRESSION: 1. Ovoid area of subtly increased density projecting over the mid left lung, possibly representing nipple shadow, overlapping rib, versus pulmonary nodule. Recommend repeat PA and lateral radiographs of the chest with nipple markers to further evaluate. 2. Otherwise, no acute cardiopulmonary findings. Electronically Signed   By: Davina Poke D.O.   On: 09/05/2021 13:41   DG HIP UNILAT WITH PELVIS 1V LEFT  Result Date: 09/06/2021 CLINICAL DATA:  Status post left hip arthroplasty EXAM: DG HIP (WITH OR WITHOUT  PELVIS) 1V*L* COMPARISON:  Intraoperative hip radiographs obtained earlier the same day, hip radiographs 09/05/2021 FINDINGS: Postsurgical changes reflecting left hip arthroplasty are seen. Hardware alignment is within expected limits, without evidence of complication. There is expected surrounding postoperative soft tissue gas. There is an additional fracture of the left superior pubic ramus which was not definitely seen on the radiograph from 06/30/2021, and possible subacute fracture of the left inferior pubic ramus. Mild degenerative changes about the right hip are stable. IMPRESSION: 1. Status post left hip arthroplasty without evidence of complication. 2. Suspected additional fractures of the left superior and inferior pubic rami, not seen on the prior radiographs from 06/30/2021. Recommend CT pelvis for further evaluation. These results will be called to the ordering clinician or representative by the Radiologist Assistant, and communication documented in the PACS or [employer]. Electronically Signed   By: Valetta Mole M.D.   On: 09/06/2021 15:59   DG HIP UNILAT WITH PELVIS 1V LEFT  Result Date: 09/06/2021 CLINICAL DATA:  Status post left hip replacement. EXAM: DG HIP (WITH OR WITHOUT PELVIS) 1V*L* COMPARISON:  September 05, 2021. FINDINGS: The left femoral and acetabular components are well situated. Expected postoperative changes are seen in the surrounding soft tissues. IMPRESSION: Status post left total hip arthroplasty. Electronically Signed   By: Marijo Conception M.D.   On: 09/06/2021 12:25   DG Hip Unilat W or Wo Pelvis 2-3 Views Left  Result Date: 09/05/2021 CLINICAL DATA:  Trauma, fall, pain EXAM: DG HIP (WITH OR WITHOUT PELVIS) 2-3V LEFT COMPARISON:  06/30/2021 FINDINGS: Fracture is seen in the subcapital portion of neck of left femur. There is overriding of fracture fragments. Flattening and sclerosis seen in  the left femoral head suggests possible avascular necrosis. Arterial  calcifications are seen in the soft tissues. IMPRESSION: Displaced fracture is seen in the subcapital portion of neck of left femur. Electronically Signed   By: Elmer Picker M.D.   On: 09/05/2021 09:38     Medical Consultants:   None.   Subjective:    [name] relate her pain is controlled has not had a bowel movement  Objective:    Vitals:   09/06/21 1608 09/06/21 2336 09/07/21 0234 09/07/21 0638  BP: (!) 145/74 122/75 139/81 (!) 173/93  Pulse: 71 66 71 80  Resp: 16 18 18 20   Temp: 98.5 F (36.9 C) 99.1 F (37.3 C) 99.1 F (37.3 C) (!) 100.7 F (38.2 C)  TempSrc: Oral Oral Oral Oral  SpO2: 97% 98% 98% 100%  Weight:      Height:       SpO2: 100 % O2 Flow Rate (L/min): 8 L/min   Intake/Output Summary (Last 24 hours) at 09/07/2021 0846 Last data filed at 09/07/2021 0800 Gross per 24 hour  Intake 2730 ml  Output 1100 ml  Net 1630 ml    Filed Weights   09/05/21 0843  Weight: 49.4 kg    Exam: General exam: In no acute distress. Respiratory system: Good air movement and clear to auscultation. Cardiovascular system: S1 & S2 heard, RRR. No JVD. Gastrointestinal system: Abdomen is nondistended, soft and nontender.  Psychiatry: Judgement and insight appear normal. Mood & affect appropriate.   Data Reviewed:    Labs: Basic Metabolic Panel: Recent Labs  Lab 09/05/21 1105 09/05/21 1538 09/06/21 0324 09/07/21 0646  NA 133* 134* 136 136  K 5.5* 5.6* 4.7 4.7  CL 103 107 110 105  CO2 19* 20* 22 22  GLUCOSE 89 101* 99 139*  BUN 19 22 24* 24*  CREATININE 0.98 1.10* 0.90 0.83  CALCIUM 9.1 8.7* 8.9 8.8*  MG  --  2.4  --   --     GFR Estimated Creatinine Clearance: 40.7 mL/min (by C-G formula based on SCr of 0.83 mg/dL). Liver Function Tests: Recent Labs  Lab 09/05/21 1538  AST 22  ALT 17  ALKPHOS 282*  BILITOT 0.3  PROT 6.8  ALBUMIN 3.0*    No results for input(s): LIPASE, AMYLASE in the last 168 hours. No results for input(s):  AMMONIA in the last 168 hours. Coagulation profile No results for input(s): INR, PROTIME in the last 168 hours. COVID-19 Labs  No results for input(s): DDIMER, FERRITIN, LDH, CRP in the last 72 hours.  Lab Results  Component Value Date   [place] NEGATIVE 09/05/2021    CBC: Recent Labs  Lab 09/05/21 1105 09/06/21 0324 09/07/21 0646  WBC 9.3 6.9 11.9*  NEUTROABS 6.3  --   --   HGB 10.4* 10.1* 10.0*  HCT 32.4* 31.3* 30.7*  MCV 100.6* 104.3* 102.7*  PLT 287 267 300    Cardiac Enzymes: No results for input(s): CKTOTAL, CKMB, CKMBINDEX, TROPONINI in the last 168 hours. BNP (last 3 results) Recent Labs    11/25/20 1219  PROBNP 1,702*    CBG: No results for input(s): GLUCAP in the last 168 hours. D-Dimer: No results for input(s): DDIMER in the last 72 hours. Hgb A1c: No results for input(s): HGBA1C in the last 72 hours. Lipid Profile: No results for input(s): CHOL, HDL, LDLCALC, TRIG, CHOLHDL, LDLDIRECT in the last 72 hours. Thyroid function studies: No results for input(s): TSH, T4TOTAL, T3FREE, THYROIDAB in the last 72 hours.  Invalid input(s): FREET3 Anemia work up: No results for input(s): VITAMINB12, FOLATE, FERRITIN, TIBC, IRON, RETICCTPCT in the last 72 hours. Sepsis Labs: Recent Labs  Lab 09/05/21 1105 09/06/21 0324 09/07/21 0646  WBC 9.3 6.9 11.9*    Microbiology Recent Results (from the past 240 hour(s))  Resp Panel by RT-PCR (Flu A&B, Covid) Nasopharyngeal Swab     Status: None   Collection Time: 09/05/21 11:05 AM   Specimen: Nasopharyngeal Swab; Nasopharyngeal(NP) swabs in vial transport medium  Result Value Ref Range Status   SARS Coronavirus 2 by RT PCR NEGATIVE NEGATIVE Final    Comment: (NOTE) SARS-CoV-2 target nucleic acids are NOT DETECTED.  The SARS-CoV-2 RNA is generally detectable in upper respiratory specimens during the acute phase of infection. The lowest concentration of SARS-CoV-2 viral copies this assay can detect is 138  copies/mL. A negative result does not preclude SARS-Cov-2 infection and should not be used as the sole basis for treatment or other patient management decisions. A negative result may occur with  improper specimen collection/handling, submission of specimen other than nasopharyngeal swab, presence of viral mutation(s) within the areas targeted by this assay, and inadequate number of viral copies(<138 copies/mL). A negative result must be combined with clinical observations, patient history, and epidemiological information. The expected result is Negative.  Fact Sheet for Patients:  EntrepreneurPulse.com.au  Fact Sheet for Healthcare Providers:  IncredibleEmployment.be  This test is no t yet approved or cleared by the [place] FDA and  has been authorized for detection and/or diagnosis of SARS-CoV-2 by FDA under an Emergency Use Authorization (EUA). This EUA will remain  in effect (meaning this test can be used) for the duration of the COVID-19 declaration under Section 564(b)(1) of the Act, 21 U.S.C.section 360bbb-3(b)(1), unless the authorization is terminated  or revoked sooner.       Influenza A by PCR NEGATIVE NEGATIVE Final   Influenza B by PCR NEGATIVE NEGATIVE Final    Comment: (NOTE) The Xpert Xpress SARS-CoV-2/FLU/RSV plus assay is intended as an aid in the diagnosis of influenza from Nasopharyngeal swab specimens and should not be used as a sole basis for treatment. Nasal washings and aspirates are unacceptable for Xpert Xpress SARS-CoV-2/FLU/RSV testing.  Fact Sheet for Patients: EntrepreneurPulse.com.au  Fact Sheet for Healthcare Providers: IncredibleEmployment.be  This test is not yet approved or cleared by the [place] FDA and has been authorized for detection and/or diagnosis of SARS-CoV-2 by FDA under an Emergency Use Authorization (EUA). This EUA will remain in effect (meaning  this test can be used) for the duration of the COVID-19 declaration under Section 564(b)(1) of the Act, 21 U.S.C. section 360bbb-3(b)(1), unless the authorization is terminated or revoked.  Performed at [facility], [address], [place], [place] [address]   Surgical pcr screen     Status: None   Collection Time: 09/05/21  1:32 PM   Specimen: Nasal Mucosa; Nasal Swab  Result Value Ref Range Status   MRSA, PCR NEGATIVE NEGATIVE Final   Staphylococcus aureus NEGATIVE NEGATIVE Final    Comment: (NOTE) The Xpert SA Assay (FDA approved for NASAL specimens in patients 84 years of age and older), is one component of a comprehensive surveillance program. It is not intended to diagnose infection nor to guide or monitor treatment. Performed at [facility], [place] [address], [place], [place] [address]      Medications:    apixaban  2.5 mg Oral BID   carvedilol  3.125 mg Oral BID  ezetimibe  10 mg Oral QHS   And   simvastatin  80 mg Oral QHS   famotidine  20 mg Oral QHS   feeding supplement  237 mL Oral BID BM   ketorolac  7.5 mg Intravenous Q8H   lidocaine  1 patch Transdermal Q24H   polyethylene glycol  17 g Oral Daily   sertraline  100 mg Oral Daily   Continuous Infusions:    LOS: 2 days   Charlynne Cousins  [facility]  09/07/2021, 8:46 AM

## 2021-09-07 NOTE — Progress Notes (Signed)
Physical Therapy Treatment Patient Details Name: Heather Garrett MRN: [id number] DOB: [birth year] Today's Date: 09/07/2021   History of Present Illness Pt is a 71 year old female s/p left THA for femoral neck fracture on 2/27 and also found to have Subacute nondisplaced left superior pubic rami fx - confirmed on CT scan overnight. Stable fx. Okay for WBAT with walker per ortho.    PT Comments    Pt ambulated into bathroom and just into hallway however distance limited by soreness and fatigue per pt.  Pt assisted back to bed per request.  Reviewed posterior hip precautions and provided handout.    Recommendations for follow up therapy are one component of a multi-disciplinary discharge planning process, led by the attending physician.  Recommendations may be updated based on patient status, additional functional criteria and insurance authorization.  Follow Up Recommendations  Home health PT     Assistance Recommended at Discharge Set up Supervision/Assistance  Patient can return home with the following A little help with bathing/dressing/bathroom;A little help with walking and/or transfers;Help with stairs or ramp for entrance;Assist for transportation   Equipment Recommendations  None recommended by PT    Recommendations for Other Services       Precautions / Restrictions Precautions Precautions: Fall;Posterior Hip Precaution Comments: provided handout on and reviewed posterior hip precautions Restrictions Weight Bearing Restrictions: Yes Other Position/Activity Restrictions: Lt LE WBAT with RW     Mobility  Bed Mobility Overal bed mobility: Needs Assistance Bed Mobility: Sit to Supine     Supine to sit: Min assist, HOB elevated Sit to supine: Min assist   General bed mobility comments: verbal cues for maintaining hip precautions, assist for Lt LE support and positioning    Transfers Overall transfer level: Needs assistance Equipment used: Rolling walker (2  wheels) Transfers: Sit to/from Stand Sit to Stand: Min guard           General transfer comment: verbal cues for UE and LE positioning, maintaining precautions    Ambulation/Gait Ambulation/Gait assistance: Min guard Gait Distance (Feet): 35 Feet Assistive device: Rolling walker (2 wheels) Gait Pattern/deviations: Step-to pattern, Decreased stance time - left, Antalgic Gait velocity: decr     General Gait Details: verbal cues for sequence, RW positioning, posture, step length, maintaining posterior hip precautions; pt assisted to bathroom and then ambulated just into hallway however distance limited by "soreness" and fatigue   Stairs             Wheelchair Mobility    Modified Rankin (Stroke Patients Only)       Balance Overall balance assessment: History of Falls                                          Cognition Arousal/Alertness: Awake/alert Behavior During Therapy: WFL for tasks assessed/performed Overall Cognitive Status: Within Functional Limits for tasks assessed                                          Exercises      General Comments        Pertinent Vitals/Pain Pain Assessment Pain Assessment: 0-10 Pain Score: 7  Pain Location: left hip Pain Descriptors / Indicators: Sore, Aching Pain Intervention(s): Repositioned, Monitored during session, Patient requesting pain meds-RN notified    Home  Living Family/patient expects to be discharged to:: Private residence Living Arrangements: Spouse/significant other   Type of Home: House Home Access: Stairs to enter   Technical brewer of Steps: 1   Home Layout: One level Home Equipment: Conservation officer, nature (2 wheels)      Prior Function            PT Goals (current goals can now be found in the care plan section) Acute Rehab PT Goals PT Goal Formulation: With patient Time For Goal Achievement: 09/14/21 Potential to Achieve Goals: Good Progress towards  PT goals: Progressing toward goals    Frequency    7X/week      PT Plan Current plan remains appropriate    Co-evaluation              AM-PAC PT "6 Clicks" Mobility   Outcome Measure  Help needed turning from your back to your side while in a flat bed without using bedrails?: A Little Help needed moving from lying on your back to sitting on the side of a flat bed without using bedrails?: A Little Help needed moving to and from a bed to a chair (including a wheelchair)?: A Little Help needed standing up from a chair using your arms (e.g., wheelchair or bedside chair)?: A Little Help needed to walk in hospital room?: A Little Help needed climbing 3-5 steps with a railing? : A Lot 6 Click Score: 17    End of Session Equipment Utilized During Treatment: Gait belt Activity Tolerance: Patient limited by fatigue Patient left: with call bell/phone within reach;in bed;with bed alarm set Nurse Communication: Mobility status PT Visit Diagnosis: Other abnormalities of gait and mobility (R26.89)     Time: 8295-6213 PT Time Calculation (min) (ACUTE ONLY): 13 min  Charges:  $Gait Training: 8-22 mins            Arlyce Dice, DPT Acute Rehabilitation Services Pager: [phone] Office: [place] 09/07/2021, 4:50 PM

## 2021-09-07 NOTE — Evaluation (Signed)
Physical Therapy Evaluation Patient Details Name: Heather Garrett MRN: [id number] DOB: Oct 01, 1950 Today's Date: 09/07/2021  History of Present Illness  Pt is a 71 year old female s/p left THA for femoral neck fracture on 2/27 and also found to have Subacute nondisplaced left superior pubic rami fx - confirmed on CT scan overnight. Stable fx. Okay for WBAT with walker per ortho.  Clinical Impression  Patient is s/p above surgery resulting in functional limitations due to the deficits listed below (see PT Problem List). Patient will benefit from skilled PT to increase their independence and safety with mobility to allow discharge to the venue listed below.   Pt educated on posterior hip precautions and WBAT Lt LE with use of RW.  Pt able to ambulate short distance and reports being happy pain is less and she is able to ambulate again.  Pt reports fall a couple weeks ago and decline in mobility due to pain.  Pt has spouse support and will likely be able to d/c home.  Recommend HHPT.       Recommendations for follow up therapy are one component of a multi-disciplinary discharge planning process, led by the attending physician.  Recommendations may be updated based on patient status, additional functional criteria and insurance authorization.  Follow Up Recommendations Home health PT    Assistance Recommended at Discharge Set up Supervision/Assistance  Patient can return home with the following  A little help with bathing/dressing/bathroom;A little help with walking and/or transfers;Help with stairs or ramp for entrance;Assist for transportation    Equipment Recommendations None recommended by PT  Recommendations for Other Services       Functional Status Assessment Patient has had a recent decline in their functional status and demonstrates the ability to make significant improvements in function in a reasonable and predictable amount of time.     Precautions / Restrictions  Precautions Precautions: Fall;Posterior Hip Precaution Comments: pt educated on posterior hip precautions Restrictions Weight Bearing Restrictions: Yes Other Position/Activity Restrictions: Lt LE WBAT with RW      Mobility  Bed Mobility Overal bed mobility: Needs Assistance Bed Mobility: Supine to Sit     Supine to sit: Min assist, HOB elevated     General bed mobility comments: verbal cues for maintaining hip precautions, assist for Lt LE support and positioning    Transfers Overall transfer level: Needs assistance Equipment used: Rolling walker (2 wheels) Transfers: Sit to/from Stand Sit to Stand: Min assist           General transfer comment: verbal cues for UE and LE positioning, assist to rise and steady    Ambulation/Gait Ambulation/Gait assistance: Min assist, Min guard Gait Distance (Feet): 24 Feet Assistive device: Rolling walker (2 wheels) Gait Pattern/deviations: Step-to pattern, Decreased stance time - left, Antalgic       General Gait Details: verbal cues for sequence, RW positioning, posture, step length, maintaining posterior hip precautions; distance to pt tolerance  Stairs            Wheelchair Mobility    Modified Rankin (Stroke Patients Only)       Balance Overall balance assessment: History of Falls                                           Pertinent Vitals/Pain Pain Assessment Pain Assessment: 0-10 Pain Score: 4  Pain Location: left hip Pain Descriptors /  Indicators: Sore, Aching Pain Intervention(s): Monitored during session, Repositioned, Premedicated before session    [place] expects to be discharged to:: Private residence Living Arrangements: Spouse/significant other   Type of Home: House Home Access: Stairs to enter   Technical brewer of Steps: 1   Home Layout: One level Home Equipment: Conservation officer, nature (2 wheels)      Prior Function Prior Level of Function :  Independent/Modified Independent                     Hand Dominance        Extremity/Trunk Assessment        Lower Extremity Assessment Lower Extremity Assessment: LLE deficits/detail LLE Deficits / Details: maintained posterior hip precautions, pt requiring assist for L LE with bed mobility, anticipated post op hip weakness observed       Communication   Communication: No difficulties  Cognition Arousal/Alertness: Awake/alert Behavior During Therapy: WFL for tasks assessed/performed Overall Cognitive Status: Within Functional Limits for tasks assessed                                          General Comments      Exercises     Assessment/Plan    PT Assessment Patient needs continued PT services  PT Problem List Decreased strength;Decreased range of motion;Decreased mobility;Decreased knowledge of precautions;Pain;Decreased knowledge of use of DME;Decreased balance;Decreased activity tolerance;Decreased skin integrity       PT Treatment Interventions Stair training;Gait training;DME instruction;Therapeutic exercise;Balance training;Functional mobility training;Therapeutic activities;Patient/family education    PT Goals (Current goals can be found in the Care Plan section)  Acute Rehab PT Goals PT Goal Formulation: With patient Time For Goal Achievement: 09/14/21 Potential to Achieve Goals: Good    Frequency 7X/week     Co-evaluation               AM-PAC PT "6 Clicks" Mobility  Outcome Measure Help needed turning from your back to your side while in a flat bed without using bedrails?: A Little Help needed moving from lying on your back to sitting on the side of a flat bed without using bedrails?: A Little Help needed moving to and from a bed to a chair (including a wheelchair)?: A Little Help needed standing up from a chair using your arms (e.g., wheelchair or bedside chair)?: A Little Help needed to walk in hospital room?: A  Little Help needed climbing 3-5 steps with a railing? : A Lot 6 Click Score: 17    End of Session Equipment Utilized During Treatment: Gait belt Activity Tolerance: Patient tolerated treatment well Patient left: in chair;with call bell/phone within reach;with chair alarm set Nurse Communication: Mobility status PT Visit Diagnosis: Other abnormalities of gait and mobility (R26.89)    Time: 2947-6546 PT Time Calculation (min) (ACUTE ONLY): 17 min   Jannette Spanner PT, DPT Acute Rehabilitation Services Pager: [phone] Office: [place] 09/07/2021, 1:52 PM

## 2021-09-08 DIAGNOSIS — D509 Iron deficiency anemia, unspecified: Secondary | ICD-10-CM

## 2021-09-08 DIAGNOSIS — Z8669 Personal history of other diseases of the nervous system and sense organs: Secondary | ICD-10-CM

## 2021-09-08 DIAGNOSIS — E871 Hypo-osmolality and hyponatremia: Secondary | ICD-10-CM

## 2021-09-08 LAB — CBC WITH DIFFERENTIAL/PLATELET
Abs Immature Granulocytes: 0.07 10*3/uL (ref 0.00–0.07)
Basophils Absolute: 0 10*3/uL (ref 0.0–0.1)
Basophils Relative: 0 %
Eosinophils Absolute: 0.1 10*3/uL (ref 0.0–0.5)
Eosinophils Relative: 1 %
HCT: 27.8 % — ABNORMAL LOW (ref 36.0–46.0)
Hemoglobin: 8.7 g/dL — ABNORMAL LOW (ref 12.0–15.0)
Immature Granulocytes: 1 %
Lymphocytes Relative: 25 %
Lymphs Abs: 2.6 10*3/uL (ref 0.7–4.0)
MCH: 33.1 pg (ref 26.0–34.0)
MCHC: 31.3 g/dL (ref 30.0–36.0)
MCV: 105.7 fL — ABNORMAL HIGH (ref 80.0–100.0)
Monocytes Absolute: 1.3 10*3/uL — ABNORMAL HIGH (ref 0.1–1.0)
Monocytes Relative: 13 %
Neutro Abs: 6.3 10*3/uL (ref 1.7–7.7)
Neutrophils Relative %: 60 %
Platelets: 232 10*3/uL (ref 150–400)
RBC: 2.63 MIL/uL — ABNORMAL LOW (ref 3.87–5.11)
RDW: 14 % (ref 11.5–15.5)
WBC: 10.4 10*3/uL (ref 4.0–10.5)
nRBC: 0 % (ref 0.0–0.2)

## 2021-09-08 LAB — SURGICAL PATHOLOGY

## 2021-09-08 MED ORDER — VITAMIN B-12 1000 MCG PO TABS
1000.0000 ug | ORAL_TABLET | Freq: Every day | ORAL | Status: DC
  Administered 2021-09-09 – 2021-09-10 (×2): 1000 ug via ORAL
  Filled 2021-09-08 (×2): qty 1

## 2021-09-08 MED ORDER — SENNOSIDES-DOCUSATE SODIUM 8.6-50 MG PO TABS
1.0000 | ORAL_TABLET | Freq: Two times a day (BID) | ORAL | Status: DC
  Administered 2021-09-09 – 2021-09-10 (×3): 1 via ORAL
  Filled 2021-09-08 (×4): qty 1

## 2021-09-08 NOTE — Progress Notes (Signed)
?PROGRESS NOTE ? ? ? Heather Garrett  GEX:[id number] DOB: 12-28-1950 DOA: 09/05/2021 ?PCP: [name], MD  ? ? ? ?Brief Narrative:  ? ?Heather Garrett is an 71 y.o. female past medical history significant for B12 deficiency abnormal LFTs migraine headaches transitional bladder carcinoma who had a fall about 2 weeks ago hitting her head but no loss of consciousness since then she has been having left hip pain and decreased range of motion left hip x-ray showed displaced fracture in the subcapital portion of the left femoral leg orthopedic surgery was consulted ? ?Subjective: ? ?Hgb dropped ,no overt sign of bleeding, she has some pain and feeling weak, no bm ?Reports one time fever was a " fluke" , reports she was drinking hot coffee,  ? ?Daughter and husband at bedside  ? ?Assessment & Plan: ? Principal Problem: ?  Hip fracture ([place]) ?Active Problems: ?  History of sleep apnea ?  Coronary arteriosclerosis in native artery ?  Hyperlipidemia ?  Hypertensive disorder ?  Normocytic anemia ?  Hyperkalemia ? ? ? ?Assessment and Plan: ?No notes have been filed under this hospital service. ?Service: Hospitalist ? ? 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 ? ?Left subacute superior and inferior pubic ramus fracture ?Seen by orthopedic who recommended conservative management ?We will need to follow-up with PCP for osteoporosis eval ? ? ?Acute on chronic anemia/macrocytosis ?Acute drop of hemoglobin likely due to expected surgical blood loss ?Repeat CBC in the morning ?Appear had anemia work-up in December 2022, at the time B12 was suboptimal in the 300 range, will start B12 supplement ? ? ? ?Hyperkalemia ?Present on admission, resolved after 1 dose of Kayexalate ? ?Hyponatremia, present on admission ?Normalized after  hydration ? ? ?Elevated creatinine ?Improved after hydration ? ?History of sleep apnea ?Obstructive sleep apnea not on CPAP at night noted. ? ?CAD/hyperlipidemia  ?Continue Coreg and statins. ?  ?essential hypertension: ?Continue Coreg, blood pressure seems to be relatively well controlled. ? ? ?FTT, home with home health and family assistance  ? ?Nutritional Assessment: ?The patient?s BMI is: Body mass index is 24.44 kg/m?Marland KitchenMarland Kitchen ?Seen by dietician.  I agree with the assessment and plan as outlined below: ?Nutrition Status: ?Nutrition Problem: Increased nutrient needs ?Etiology: post-op healing, hip fracture ?Signs/Symptoms: estimated needs ?Interventions: Ensure Surgery ? ?. ? ? ?I have Reviewed nursing notes, Vitals, pain scores, I/o's, Lab results and  imaging results since pt's last encounter, details please see discussion above  ?I ordered the following labs:  ?Unresulted Labs (From admission, onward)  ? ?  Start     Ordered  ? 09/09/21 0500  CBC  Tomorrow morning,   R       ?Question:  Specimen collection method  Answer:  Lab=Lab collect  ? 09/08/21 1018  ? 09/09/21 0102  Basic metabolic panel  Tomorrow morning,   R       ?Question:  Specimen collection method  Answer:  Lab=Lab collect  ? 09/08/21 1018  ? ?  ?  ? ?  ? ? ? ?DVT prophylaxis: apixaban (ELIQUIS) tablet 2.5 mg Start: 09/07/21 1000 ?SCDs Start: 09/05/21 1253 ?apixaban (ELIQUIS) tablet 2.5 mg  ? ?Code Status:   Code Status: Full Code ? ?Family Communication: Daughter and husband at bedside ?Disposition:  ? ?Status is: Inpatient ? ?Dispo: The patient is from: home ?  Anticipated d/c is to: home health ?             Anticipated d/c date is: 3/2 if hgb stable ? ?Antimicrobials:   ?Anti-infectives (From admission, onward)  ? ? Start     Dose/Rate Route Frequency Ordered Stop  ? 09/06/21 1800  ceFAZolin (ANCEF) IVPB 2g/100 mL premix       ? 2 g ?200 mL/hr over 30 Minutes Intravenous Every 8 hours 09/06/21 1421 09/07/21 0640  ? 09/06/21 1000   ceFAZolin (ANCEF) IVPB 2g/100 mL premix       ? 2 g ?200 mL/hr over 30 Minutes Intravenous On call to O.R. 09/06/21 0947 09/06/21 1125  ? ?  ? ? ? ? ? ? ?Objective: ?Vitals:  ? 09/07/21 1348 09/07/21 2212 09/08/21 0600 09/08/21 1441  ?BP: 125/66 117/64 (!) 162/82 135/76  ?Pulse: 76 81 78 70  ?Resp: 18 17 17 18   ?Temp: 99 ?F (37.2 ?C) 99.2 ?F (37.3 ?C) 98.5 ?F (36.9 ?C) 98.3 ?F (36.8 ?C)  ?TempSrc: Oral Oral Oral Oral  ?SpO2: 100% 98% 100% 100%  ?Weight:      ?Height:      ? ? ?Intake/Output Summary (Last 24 hours) at 09/08/2021 1940 ?Last data filed at 09/08/2021 0600 ?Gross per 24 hour  ?Intake 90 ml  ?Output 0 ml  ?Net 90 ml  ? ?Filed Weights  ? 09/05/21 0843  ?Weight: 49.4 kg  ? ? ?Examination: ? ?General exam: Frail, appear weak , NAD ?Respiratory system: Clear to auscultation. Respiratory effort normal. ?Cardiovascular system:  RRR.  ?Gastrointestinal system: Abdomen is nondistended, soft and nontender.  Normal bowel sounds heard. ?Central nervous system: Alert and oriented. No focal neurological deficits. ?Extremities: Postop changes left hip ?Skin: No rashes, lesions or ulcers ?Psychiatry: Judgement and insight appear normal. Mood & affect appropriate.  ? ? ? ?Data Reviewed: I have personally reviewed  labs and visualized  imaging studies since the last encounter and formulate the plan  ? ? ? ? ? ? ?Scheduled Meds: ? apixaban  2.5 mg Oral BID  ? carvedilol  3.125 mg Oral BID  ? ezetimibe  10 mg Oral QHS  ? And  ? simvastatin  80 mg Oral QHS  ? famotidine  20 mg Oral QHS  ? feeding supplement  237 mL Oral BID BM  ? lidocaine  1 patch Transdermal Q24H  ? senna-docusate  1 tablet Oral BID  ? sertraline  100 mg Oral Daily  ? [START ON 09/09/2021] vitamin B-12  1,000 mcg Oral Daily  ? ?Continuous Infusions: ? ? LOS: 3 days  ? ? ? ?Florencia Reasons, MD PhD FACP ?[facility] ? ?Available via Epic secure chat 7am-7pm for nonurgent issues ?Please page for urgent issues ?To page the attending provider between 7A-7P or the  covering provider during after hours 7P-7A, please log into the web site [email or web address] and access using universal [place] password for that web site. If you do not have the password, please call the hospital operator. ? ? ? ?09/08/2021, 7:40 PM  ? ? ?

## 2021-09-08 NOTE — TOC Progression Note (Signed)
Transition of Care (TOC) - Progression Note  ? ?Patient Details  ?Name: Heather Garrett ?MRN: [id number] ?Date of Birth: [birth year] ? ?Transition of Care (TOC) CM/SW Contact  ?Sherie Don, LCSW ?Phone Number: ?09/08/2021, 10:04 AM ? ?Clinical Narrative: HHPT orders placed. CSW updated Stacie with [facility]. ? ?Expected Discharge Plan: [place] ?Barriers to Discharge: Continued Medical Work up ? ?Expected Discharge Plan and Services ?Expected Discharge Plan: [place] ?In-house Referral: Clinical Social Work ?Post Acute Care Choice: Home Health ?Living arrangements for the past 2 months: [place]           ?DME Arranged: N/A ?DME Agency: NA ?HH Arranged: PT ?[place] Agency: [place] ?Date HH Agency Contacted: 09/07/21 ?Time [place]: 1499 ?Representative spoke with at [place]: [place] ? ?Readmission Risk Interventions ?No flowsheet data found. ? ?

## 2021-09-08 NOTE — Progress Notes (Signed)
Physical Therapy Treatment ?Patient Details ?Name: Heather Garrett ?MRN: [id number] ?DOB: 09/16/1950 ?Today's Date: 09/08/2021 ? ? ?History of Present Illness Pt is a 71 year old female s/p left THA for femoral neck fracture on 2/27 and also found to have Subacute nondisplaced left superior pubic rami fx - confirmed on CT scan overnight. Stable fx. Okay for WBAT with walker per ortho. ? ?  ?PT Comments  ? ? Pt progressing well today. A little fatigued with activity but incr tolerance/gait distance  overall. Pt is hopeful to go home tomorrow   ?Recommendations for follow up therapy are one component of a multi-disciplinary discharge planning process, led by the attending physician.  Recommendations may be updated based on patient status, additional functional criteria and insurance authorization. ? ?Follow Up Recommendations ? Home health PT ?  ?  ?Assistance Recommended at Discharge Set up Supervision/Assistance  ?Patient can return home with the following A little help with bathing/dressing/bathroom;A little help with walking and/or transfers;Help with stairs or ramp for entrance;Assist for transportation ?  ?Equipment Recommendations ? None recommended by PT  ?  ?Recommendations for Other Services   ? ? ?  ?Precautions / Restrictions Precautions ?Precautions: Fall;Posterior Hip ?Precaution Comments: reviewed posterior hip precautions ?Restrictions ?Other Position/Activity Restrictions: Lt LE WBAT with RW  ?  ? ?Mobility ? Bed Mobility ?Overal bed mobility: Needs Assistance ?Bed Mobility: Supine to Sit ?  ?  ?Supine to sit: Min guard ?  ?  ?General bed mobility comments: pt able to progress LLE off bed using gait belt as leg lifter, cues for posterior THP. pt demonstrates carryover from previous sessions ?  ? ?Transfers ?Overall transfer level: Needs assistance ?Equipment used: Rolling walker (2 wheels) ?Transfers: Sit to/from Stand ?Sit to Stand: Min guard ?  ?  ?  ?  ?  ?General transfer comment: verbal cues for  UE and LE positioning, maintaining precautions ?  ? ?Ambulation/Gait ?Ambulation/Gait assistance: Min guard ?Gait Distance (Feet): 55 Feet ?Assistive device: Rolling walker (2 wheels) ?Gait Pattern/deviations: Step-to pattern, Decreased stance time - left ?Gait velocity: decr ?  ?  ?General Gait Details: verbal cues for sequence, RW positioning, posture, step length, maintaining posterior hip precautions; ? ? ?Stairs ?  ?  ?  ?  ?  ? ? ?Wheelchair Mobility ?  ? ?Modified Rankin (Stroke Patients Only) ?  ? ? ?  ?Balance   ?  ?  ?  ?  ?  ?  ?  ?  ?  ?  ?  ?  ?  ?  ?  ?  ?  ?  ?  ? ?  ?Cognition Arousal/Alertness: Awake/alert ?Behavior During Therapy: [facility] for tasks assessed/performed ?Overall Cognitive Status: Within Functional Limits for tasks assessed ?  ?  ?  ?  ?  ?  ?  ?  ?  ?  ?  ?  ?  ?  ?  ?  ?  ?  ?  ? ?  ?Exercises Total Joint Exercises ?Ankle Circles/Pumps: AROM, Both, 10 reps ?Quad Sets: AROM, Both, 5 reps ? ?  ?General Comments   ?  ?  ? ?Pertinent Vitals/Pain Pain Assessment ?Pain Assessment: 0-10 ?Pain Score: 3  ?Pain Location: left hip ?Pain Descriptors / Indicators: Sore, Aching ?Pain Intervention(s): Limited activity within patient's tolerance, Monitored during session, Premedicated before session, Repositioned  ? ? ?Home Living   ?  ?  ?  ?  ?  ?  ?  ?  ?  ?   ?  ?  Prior Function    ?  ?  ?   ? ?PT Goals (current goals can now be found in the care plan section) Acute Rehab PT Goals ?PT Goal Formulation: With patient ?Time For Goal Achievement: 09/14/21 ?Potential to Achieve Goals: Good ?Progress towards PT goals: Progressing toward goals ? ?  ?Frequency ? ? ? 7X/week ? ? ? ?  ?PT Plan Current plan remains appropriate  ? ? ?Co-evaluation   ?  ?  ?  ?  ? ?  ?AM-PAC PT "6 Clicks" Mobility   ?Outcome Measure ? Help needed turning from your back to your side while in a flat bed without using bedrails?: None ?  ?  ?  ?  ?  ?6 Click Score: 4 ? ?  ?End of Session Equipment Utilized During Treatment: Gait  belt ?Activity Tolerance: Patient tolerated treatment well ?Patient left: with call bell/phone within reach;in bed;with bed alarm set;with family/visitor present ?Nurse Communication: Mobility status ?PT Visit Diagnosis: Other abnormalities of gait and mobility (R26.89) ?  ? ? ?Time: 8527-7824 ?PT Time Calculation (min) (ACUTE ONLY): 18 min ? ?Charges:  $Gait Training: 8-22 mins          ?          ? Baxter Flattery, PT ? ?Acute Rehab Dept [facility]) [phone] ?Pager [phone] ? ?09/08/2021 ? ? ? ?Trevor Duty ?09/08/2021, 11:55 AM ? ?

## 2021-09-08 NOTE — Progress Notes (Signed)
? ? ? ?  Subjective: ? Patient doing well this morning. Difficulty sleeping overnight. Pain well controlled. Denies distal n/t. Working well with physical therapy, was able to ambulate more this morning then yesterday no issues. ? ?Objective:  ? ?VITALS:   ?Vitals:  ? 09/07/21 0638 09/07/21 1348 09/07/21 2212 09/08/21 0600  ?BP: (!) 173/93 125/66 117/64 (!) 162/82  ?Pulse: 80 76 81 78  ?Resp: 20 18 17 17   ?Temp: (!) 100.7 ?F (38.2 ?C) 99 ?F (37.2 ?C) 99.2 ?F (37.3 ?C) 98.5 ?F (36.9 ?C)  ?TempSrc: Oral Oral Oral Oral  ?SpO2: 100% 100% 98% 100%  ?Weight:      ?Height:      ? ? ?Sensation intact distally ?Intact pulses distally ?Dorsiflexion/Plantar flexion intact ?Incision: dressing C/D/I ?Compartment soft ? ? ?Lab Results  ?Component Value Date  ? WBC 10.4 09/08/2021  ? HGB 8.7 (L) 09/08/2021  ? HCT 27.8 (L) 09/08/2021  ? MCV 105.7 (H) 09/08/2021  ? PLT 232 09/08/2021  ? ?BMET ?   ?Component Value Date/Time  ? NA 136 09/07/2021 0646  ? NA 142 12/08/2020 1056  ? K 4.7 09/07/2021 0646  ? CL 105 09/07/2021 0646  ? CO2 22 09/07/2021 0646  ? GLUCOSE 139 (H) 09/07/2021 0646  ? BUN 24 (H) 09/07/2021 0646  ? BUN 8 12/08/2020 1056  ? CREATININE 0.83 09/07/2021 0646  ? CALCIUM 8.8 (L) 09/07/2021 0646  ? EGFR 74 12/08/2020 1056  ? GFRNONAA >60 09/07/2021 0646  ? ? ? ? ?Xray: left total hip arthroplasty in good position, leg lengths restored, minimally displaced superior rami fracture on the left side visible. ?Pathology: femoral head negative for malignancy ? ?Assessment/Plan: ?2 Days Post-Op  ? ?Principal Problem: ?  Hip fracture ([place]) ?Active Problems: ?  History of sleep apnea ?  Coronary arteriosclerosis in native artery ?  Hyperlipidemia ?  Hypertensive disorder ?  Normocytic anemia ?  Hyperkalemia ? ?S/p left THA for femoral neck fracture 2/27 ? ?Subacute nondisplaced left superior pubic rami fx - confirmed on CT scan overnight. Stable fx. Okay for WBAT with walker. ? ?Post op recs: ?WB: WBAT LLE, posterior hip precautions  x6 weeks ?Abx: ancef x23 hours post op ?Imaging: PACU pelvis Xray ?Dressing: Aquacell, keep intact until follow up ?DVT prophylaxis: eliquis 2.5mg  BID x4 weeks given cancer hx and In the setting of fracture ?Follow up: 2 weeks after surgery for a wound check with Dr. Zachery Dakins at [facility].  ?Address: [address] [place], [place], [place] [address]  ?Office Phone: [phone] ? ? ? ?Heather Garrett A Siah Steely ?09/08/2021, 11:19 AM ? ? ?Charlies Constable, MD ? ?Contact information:   ?Weekdays 7am-5pm epic message Dr. Zachery Dakins, or call office for patient follow up: [phone] ?After hours and holidays please check [email or web address] for group call information for [facility] ? ?  ?

## 2021-09-09 LAB — BASIC METABOLIC PANEL
Anion gap: 5 (ref 5–15)
BUN: 28 mg/dL — ABNORMAL HIGH (ref 8–23)
CO2: 26 mmol/L (ref 22–32)
Calcium: 8.6 mg/dL — ABNORMAL LOW (ref 8.9–10.3)
Chloride: 105 mmol/L (ref 98–111)
Creatinine, Ser: 1 mg/dL (ref 0.44–1.00)
GFR, Estimated: >60 mL/min (ref >60)
Glucose, Bld: 95 mg/dL (ref 70–99)
Potassium: 5.7 mmol/L — ABNORMAL HIGH (ref 3.5–5.1)
Sodium: 136 mmol/L (ref 135–145)

## 2021-09-09 LAB — CBC
HCT: 24 % — ABNORMAL LOW (ref 36.0–46.0)
Hemoglobin: 7.7 g/dL — ABNORMAL LOW (ref 12.0–15.0)
MCH: 33.5 pg (ref 26.0–34.0)
MCHC: 32.1 g/dL (ref 30.0–36.0)
MCV: 104.3 fL — ABNORMAL HIGH (ref 80.0–100.0)
Platelets: 222 10*3/uL (ref 150–400)
RBC: 2.3 MIL/uL — ABNORMAL LOW (ref 3.87–5.11)
RDW: 14.1 % (ref 11.5–15.5)
WBC: 11.1 10*3/uL — ABNORMAL HIGH (ref 4.0–10.5)
nRBC: 0 % (ref 0.0–0.2)

## 2021-09-09 MED ORDER — POLYETHYLENE GLYCOL 3350 17 G PO PACK
17.0000 g | PACK | Freq: Every day | ORAL | Status: DC
  Administered 2021-09-09 – 2021-09-10 (×2): 17 g via ORAL
  Filled 2021-09-09 (×2): qty 1

## 2021-09-09 MED ORDER — APIXABAN 2.5 MG PO TABS: 2.5000 mg | ORAL_TABLET | Freq: Two times a day (BID) | ORAL | 0 refills | Status: DC

## 2021-09-09 MED ORDER — FOLIC ACID 1 MG PO TABS
1.0000 mg | ORAL_TABLET | Freq: Every day | ORAL | Status: DC
  Administered 2021-09-09 – 2021-09-10 (×2): 1 mg via ORAL
  Filled 2021-09-09 (×2): qty 1

## 2021-09-09 MED ORDER — BISACODYL 10 MG RE SUPP
10.0000 mg | Freq: Once | RECTAL | Status: AC
Start: 2021-09-09 — End: 2021-09-09
  Administered 2021-09-09: 10 mg via RECTAL
  Filled 2021-09-09: qty 1

## 2021-09-09 MED ORDER — SODIUM CHLORIDE 0.9 % IV SOLN
INTRAVENOUS | Status: AC
Start: 2021-09-09 — End: 2021-09-10

## 2021-09-09 MED ORDER — SODIUM ZIRCONIUM CYCLOSILICATE 5 G PO PACK
5.0000 g | PACK | Freq: Once | ORAL | Status: AC
  Administered 2021-09-09: 5 g via ORAL
  Filled 2021-09-09: qty 1

## 2021-09-09 MED ORDER — OXYCODONE HCL 5 MG PO TABS: 5.0000 mg | ORAL_TABLET | ORAL | 0 refills | Status: AC | PRN

## 2021-09-09 NOTE — Progress Notes (Signed)
Physical Therapy Treatment ?Patient Details ?Name: Heather Garrett ?MRN: [id number] ?DOB: [birth year] ?Today's Date: 09/09/2021 ? ? ?History of Present Illness Pt is a 71 year old female s/p left THA for femoral neck fracture on 2/27 and also found to have Subacute nondisplaced left superior pubic rami fx - confirmed on CT scan overnight. Stable fx. Okay for WBAT with walker per ortho. ? ?  ?PT Comments  ? ? Pt eager to mobilize again and able to progress ambulation distance a little this afternoon.  Pt anticipates d/c home hopefully tomorrow.   ?Recommendations for follow up therapy are one component of a multi-disciplinary discharge planning process, led by the attending physician.  Recommendations may be updated based on patient status, additional functional criteria and insurance authorization. ? ?Follow Up Recommendations ? Home health PT ?  ?  ?Assistance Recommended at Discharge Set up Supervision/Assistance  ?Patient can return home with the following A little help with bathing/dressing/bathroom;A little help with walking and/or transfers;Help with stairs or ramp for entrance;Assist for transportation ?  ?Equipment Recommendations ? None recommended by PT  ?  ?Recommendations for Other Services   ? ? ?  ?Precautions / Restrictions Precautions ?Precautions: Fall;Posterior Hip ?Precaution Comments: reviewed posterior hip precautions ?Restrictions ?Other Position/Activity Restrictions: Lt LE WBAT with RW  ?  ? ?Mobility ? Bed Mobility ?Overal bed mobility: Needs Assistance ?Bed Mobility: Supine to Sit ?  ?  ?Supine to sit: Supervision ?  ?  ?General bed mobility comments: pt able to move L LE off bed without gait belt, demonstrates carryover from previous sessions ?  ? ?Transfers ?Overall transfer level: Needs assistance ?Equipment used: Rolling walker (2 wheels) ?Transfers: Sit to/from Stand ?Sit to Stand: Min guard ?  ?  ?  ?  ?  ?General transfer comment: verbal cues for UE and LE positioning ?   ? ?Ambulation/Gait ?Ambulation/Gait assistance: Min guard ?Gait Distance (Feet): 100 Feet ?Assistive device: Rolling walker (2 wheels) ?Gait Pattern/deviations: Step-to pattern, Decreased stance time - left ?Gait velocity: decr ?  ?  ?General Gait Details: verbal cues for RW positioning, posture, step length, maintaining posterior hip precautions with turning ? ? ?Stairs ?  ?  ?  ?  ?  ? ? ?Wheelchair Mobility ?  ? ?Modified Rankin (Stroke Patients Only) ?  ? ? ?  ?Balance   ?  ?  ?  ?  ?  ?  ?  ?  ?  ?  ?  ?  ?  ?  ?  ?  ?  ?  ?  ? ?  ?Cognition Arousal/Alertness: Awake/alert ?Behavior During Therapy: [facility] for tasks assessed/performed ?Overall Cognitive Status: Within Functional Limits for tasks assessed ?  ?  ?  ?  ?  ?  ?  ?  ?  ?  ?  ?  ?  ?  ?  ?  ?  ?  ?  ? ?  ?Exercises  ? ?  ?General Comments   ?  ?  ? ?Pertinent Vitals/Pain Pain Assessment ?Pain Assessment: 0-10 ?Pain Score: 4  ?Pain Location: left hip ?Pain Descriptors / Indicators: Sore, Aching ?Pain Intervention(s): Repositioned, Monitored during session  ? ? ?Home Living   ?  ?  ?  ?  ?  ?  ?  ?  ?  ?   ?  ?Prior Function    ?  ?  ?   ? ?PT Goals (current goals can now be found in the care plan section)  Progress towards PT goals: Progressing toward goals ? ?  ?Frequency ? ? ? 7X/week ? ? ? ?  ?PT Plan Current plan remains appropriate  ? ? ?Co-evaluation   ?  ?  ?  ?  ? ?  ?AM-PAC PT "6 Clicks" Mobility   ?Outcome Measure ? Help needed turning from your back to your side while in a flat bed without using bedrails?: None ?Help needed moving from lying on your back to sitting on the side of a flat bed without using bedrails?: A Little ?Help needed moving to and from a bed to a chair (including a wheelchair)?: A Little ?Help needed standing up from a chair using your arms (e.g., wheelchair or bedside chair)?: A Little ?Help needed to walk in hospital room?: A Little ?Help needed climbing 3-5 steps with a railing? : A Little ?6 Click Score: 19 ? ?  ?End of  Session Equipment Utilized During Treatment: Gait belt ?Activity Tolerance: Patient tolerated treatment well ?Patient left: with call bell/phone within reach;in bed;with bed alarm set ?Nurse Communication: Mobility status ?PT Visit Diagnosis: Other abnormalities of gait and mobility (R26.89) ?  ? ? ?Time: 7903-8333 ?PT Time Calculation (min) (ACUTE ONLY): 14 min ? ?Charges:  $Gait Training: 8-22 mins ?         ?          ? ?Kati PT, DPT ?Acute Rehabilitation Services ?Pager: [phone] ?Office: [phone] ? ? ?Kati L Payson ?09/09/2021, 3:36 PM ? ?

## 2021-09-09 NOTE — Progress Notes (Signed)
? ? ? ?  Subjective: ? Patient doing well this morning. Slept better overnight. Eager to go home. Hopefully today. Denies distal n/t. Home health set up for home. ? ?Objective:  ? ?VITALS:   ?Vitals:  ? 09/08/21 0600 09/08/21 1441 09/08/21 2100 09/09/21 0500  ?BP: (!) 162/82 135/76 (!) 106/57 (!) 150/93  ?Pulse: 78 70 82 62  ?Resp: _0 ?Temp: 98.5 ?F (36.9 ?C) 98.3 ?F (36.8 ?C) 98.9 ?F (37.2 ?C) 98.2 ?F (36.8 ?C)  ?TempSrc: Oral Oral Oral Oral  ?SpO2: 100% 100% 97% 99%  ?Weight:      ?Height:      ? ? ?Sensation intact distally ?Intact pulses distally ?Dorsiflexion/Plantar flexion intact ?Incision: dressing C/D/I ?Compartment soft ? ? ?Lab Results  ?Component Value Date  ? WBC 11.1 (H) 09/09/2021  ? HGB 7.7 (L) 09/09/2021  ? HCT 24.0 (L) 09/09/2021  ? MCV 104.3 (H) 09/09/2021  ? PLT 222 09/09/2021  ? ?BMET ?   ?Component Value Date/Time  ? NA 136 09/09/2021 0336  ? NA 142 12/08/2020 1056  ? K 5.7 (H) 09/09/2021 0336  ? CL 105 09/09/2021 0336  ? CO2 26 09/09/2021 0336  ? GLUCOSE 95 09/09/2021 0336  ? BUN 28 (H) 09/09/2021 0336  ? BUN 8 12/08/2020 1056  ? CREATININE 1.00 09/09/2021 0336  ? CALCIUM 8.6 (L) 09/09/2021 0336  ? EGFR 74 12/08/2020 1056  ? GFRNONAA >60 09/09/2021 0336  ? ? ? ? ?Xray: left total hip arthroplasty in good position, leg lengths restored, minimally displaced superior rami fracture on the left side visible. ?Pathology: femoral head negative for malignancy ? ?Assessment/Plan: ?3 Days Post-Op  ? ?Principal Problem: ?  Hip fracture ([place]) ?Active Problems: ?  History of sleep apnea ?  Coronary arteriosclerosis in native artery ?  Hyperlipidemia ?  Hypertensive disorder ?  Normocytic anemia ?  Hyperkalemia ? ?S/p left THA for femoral neck fracture 2/27 ? ?Subacute nondisplaced left superior pubic rami fx - confirmed on CT scan overnight. Stable fx. Okay for WBAT with walker. ? ?Post op recs: ?WB: WBAT LLE, posterior hip precautions x6 weeks ?Abx: ancef x23 hours post op ?Imaging: PACU pelvis  Xray ?Dressing: Aquacell, keep intact until follow up ?DVT prophylaxis: eliquis 2.39m BID x4 weeks given cancer hx and In the setting of fracture ?Follow up: 2 weeks after surgery [facility]  ?[address] [address] ?Office Phone: [phone] ? ? ?Avarey Yaeger A Kandy Towery ?09/09/2021, 7:19 AM ? ? ?DCharlies Constable MD ? ?Contact information:   ?Weekdays 7am-5pm epic message Dr. MZachery Dakins or call office for patient follow up: [phone] ?After hours and holidays please check [email or web address] for group call information for [facility] ? ?  ?

## 2021-09-09 NOTE — Progress Notes (Signed)
Physical Therapy Treatment ?Patient Details ?Name: Heather Garrett ?MRN: [id number] ?DOB: 01-14-1951 ?Today's Date: 09/09/2021 ? ? ?History of Present Illness Pt is a 71 year old female s/p left THA for femoral neck fracture on 2/27 and also found to have Subacute nondisplaced left superior pubic rami fx - confirmed on CT scan overnight. Stable fx. Okay for WBAT with walker per ortho. ? ?  ?PT Comments  ? ? Pt reports feeling down about not being able to go home today (hospitalist wants to keep pt overnight).  Pt ambulated in hallway and performed LE exercises.  Reviewed posterior hip precautions during mobility and exercises. ?  ?Recommendations for follow up therapy are one component of a multi-disciplinary discharge planning process, led by the attending physician.  Recommendations may be updated based on patient status, additional functional criteria and insurance authorization. ? ?Follow Up Recommendations ? Home health PT ?  ?  ?Assistance Recommended at Discharge Set up Supervision/Assistance  ?Patient can return home with the following A little help with bathing/dressing/bathroom;A little help with walking and/or transfers;Help with stairs or ramp for entrance;Assist for transportation ?  ?Equipment Recommendations ? None recommended by PT  ?  ?Recommendations for Other Services   ? ? ?  ?Precautions / Restrictions Precautions ?Precautions: Fall;Posterior Hip ?Precaution Comments: reviewed posterior hip precautions ?Restrictions ?Other Position/Activity Restrictions: Lt LE WBAT with RW  ?  ? ?Mobility ? Bed Mobility ?Overal bed mobility: Needs Assistance ?Bed Mobility: Supine to Sit ?  ?  ?Supine to sit: Min guard ?  ?  ?General bed mobility comments: pt able to progress LLE off bed using gait belt as leg lifter, cues for posterior THP. pt demonstrates carryover from previous sessions ?  ? ?Transfers ?Overall transfer level: Needs assistance ?Equipment used: Rolling walker (2 wheels) ?Transfers: Sit to/from  Stand ?Sit to Stand: Min guard ?  ?  ?  ?  ?  ?General transfer comment: verbal cues for UE and LE positioning ?  ? ?Ambulation/Gait ?Ambulation/Gait assistance: Min guard ?Gait Distance (Feet): 60 Feet ?Assistive device: Rolling walker (2 wheels) ?Gait Pattern/deviations: Step-to pattern, Decreased stance time - left ?Gait velocity: decr ?  ?  ?General Gait Details: verbal cues for sequence, RW positioning, posture, step length, maintaining posterior hip precautions ? ? ?Stairs ?  ?  ?  ?  ?  ? ? ?Wheelchair Mobility ?  ? ?Modified Rankin (Stroke Patients Only) ?  ? ? ?  ?Balance   ?  ?  ?  ?  ?  ?  ?  ?  ?  ?  ?  ?  ?  ?  ?  ?  ?  ?  ?  ? ?  ?Cognition Arousal/Alertness: Awake/alert ?Behavior During Therapy: [facility] for tasks assessed/performed ?Overall Cognitive Status: Within Functional Limits for tasks assessed ?  ?  ?  ?  ?  ?  ?  ?  ?  ?  ?  ?  ?  ?  ?  ?  ?  ?  ?  ? ?  ?Exercises Total Joint Exercises ?Ankle Circles/Pumps: AROM, Both, 10 reps ?Quad Sets: AROM, Both, 5 reps ?Heel Slides: AAROM, Left, 10 reps, Limitations ?Heel Slides Limitations: all exercises within posterior hip precautions ?Hip ABduction/ADduction: Left, 10 reps, AAROM ?Long Arc Quad: AROM, Both, 10 reps, Seated ? ?  ?General Comments   ?  ?  ? ?Pertinent Vitals/Pain Pain Assessment ?Pain Assessment: 0-10 ?Pain Score: 3  ?Pain Location: left hip ?Pain Descriptors /  Indicators: Sore, Aching ?Pain Intervention(s): Repositioned, Monitored during session  ? ? ?Home Living   ?  ?  ?  ?  ?  ?  ?  ?  ?  ?   ?  ?Prior Function    ?  ?  ?   ? ?PT Goals (current goals can now be found in the care plan section) Progress towards PT goals: Progressing toward goals ? ?  ?Frequency ? ? ? 7X/week ? ? ? ?  ?PT Plan Current plan remains appropriate  ? ? ?Co-evaluation   ?  ?  ?  ?  ? ?  ?AM-PAC PT "6 Clicks" Mobility   ?Outcome Measure ? Help needed turning from your back to your side while in a flat bed without using bedrails?: None ?Help needed moving from  lying on your back to sitting on the side of a flat bed without using bedrails?: A Little ?Help needed moving to and from a bed to a chair (including a wheelchair)?: A Little ?Help needed standing up from a chair using your arms (e.g., wheelchair or bedside chair)?: A Little ?Help needed to walk in hospital room?: A Little ?Help needed climbing 3-5 steps with a railing? : A Lot ?6 Click Score: 18 ? ?  ?End of Session Equipment Utilized During Treatment: Gait belt ?Activity Tolerance: Patient tolerated treatment well ?Patient left: in chair;with call bell/phone within reach;with chair alarm set ?Nurse Communication: Mobility status ?PT Visit Diagnosis: Other abnormalities of gait and mobility (R26.89) ?  ? ? ?Time: 1046-1101 ?PT Time Calculation (min) (ACUTE ONLY): 15 min ? ?Charges:  $Therapeutic Exercise: 8-22 mins          ? ?Jannette Spanner PT, DPT ?Acute Rehabilitation Services ?Pager: [phone] ?Office: [phone] ? ? ? ?Kati L Payson ?09/09/2021, 12:57 PM ? ?

## 2021-09-09 NOTE — Plan of Care (Signed)
Plan of care reviewed and discussed with the patient. 

## 2021-09-09 NOTE — Progress Notes (Signed)
?PROGRESS NOTE ? ? ? Heather Garrett  VOZ:[id number] DOB: 11-03-1950 DOA: 09/05/2021 ?PCP: [name], MD  ? ? ? ?Brief Narrative:  ? ?Heather Garrett is an 71 y.o. female past medical history significant for B12 deficiency abnormal LFTs migraine headaches transitional bladder carcinoma who had a fall about 2 weeks ago hitting her head but no loss of consciousness since then she has been having left hip pain and decreased range of motion left hip x-ray showed displaced fracture in the subcapital portion of the left femoral leg orthopedic surgery was consulted ? ?Subjective: ? ?Reports feeling better , wants to go home but  ?Hgb continue to drop,no overt sign of bleeding,  no bm ?K elevated ?Oral intake is poor  ?Reports one time fever was a " fluke" , reports she was drinking hot coffee,  ? ?Daughter and husband at bedside  ? ?Assessment & Plan: ? Principal Problem: ?  Hip fracture ([place]) ?Active Problems: ?  History of sleep apnea ?  Coronary arteriosclerosis in native artery ?  Hyperlipidemia ?  Hypertensive disorder ?  Normocytic anemia ?  Hyperkalemia ? ? ? ?Assessment and Plan: ?No notes have been filed under this hospital service. ?Service: Hospitalist ? ? 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 ? ?Left subacute superior and inferior pubic ramus fracture ?Seen by orthopedic who recommended conservative management ?We will need to follow-up with PCP for osteoporosis eval ? ? ?Acute on chronic anemia/macrocytosis ?Acute drop of hemoglobin likely due to expected surgical blood loss ?Repeat CBC in the morning ?Appear had anemia work-up in December 2022, at the time B12 was suboptimal in the 300 range, will start B12 supplement ? ? ? ?Hyperkalemia, reports was taken off lisinopril by pcp recently ?Present on admission, resolved  after 1 dose of Kayexalate ?K again elevated today, will give lokelma, start hydration, start low k diet ?Repeat bmp in am ? ?Hyponatremia, present on admission ?Normalized after hydration ?Reports was taken off HCTZ by pcp recently ? ? ?Elevated creatinine ?Improved after hydration ?Encourage oral intake  ? ?History of sleep apnea ?Obstructive sleep apnea not on CPAP at night noted. ? ?CAD/hyperlipidemia  ?Continue Coreg and statins. ?  ?essential hypertension: ?Continue Coreg, blood pressure seems to be relatively well controlled. ? ? ?FTT, home with home health and family assistance  ? ?H/o gastric bypass surgery in 2010 ?Was taking vitamins but was taken off in 06/2021 because of abnormal liver function at the time ?Still taking vitamin D once a week, multivitamins daily ? ?H/o bladder cancer in remission  ? ?Nutritional Assessment: ?The patient?s BMI is: Body mass index is 24.44 kg/m?Marland KitchenMarland Kitchen ?Seen by dietician.  I agree with the assessment and plan as outlined below: ?Nutrition Status: ?Nutrition Problem: Increased nutrient needs ?Etiology: post-op healing, hip fracture ?Signs/Symptoms: estimated needs ?Interventions: Ensure Surgery ? ?. ? ? ?I have Reviewed nursing notes, Vitals, pain scores, I/o's, Lab results and  imaging results since pt's last encounter, details please see discussion above  ?I ordered the following labs:  ?Unresulted Labs (From admission, onward)  ? ?  Start     Ordered  ? 09/10/21 4259  Basic metabolic panel  Tomorrow morning,   R       ?Question:  Specimen collection method  Answer:  Lab=Lab collect  ? 09/09/21 0957  ? 09/10/21 0500  CBC with  Differential/Platelet  Tomorrow morning,   R       ?Question:  Specimen collection method  Answer:  Lab=Lab collect  ? 09/09/21 0957  ? 09/10/21 0500  Hepatic function panel  Tomorrow morning,   R       ?Question:  Specimen collection method  Answer:  Lab=Lab collect  ? 09/09/21 0957  ? 09/10/21 0500  CK  Tomorrow morning,   R       ?Question:  Specimen  collection method  Answer:  Lab=Lab collect  ? 09/09/21 0957  ? 09/10/21 0500  Iron and TIBC  Tomorrow morning,   R       ?Question:  Specimen collection method  Answer:  Lab=Lab collect  ? 09/09/21 1007  ? 09/10/21 0500  Ferritin  Tomorrow morning,   R       ?Question:  Specimen collection method  Answer:  Lab=Lab collect  ? 09/09/21 1007  ? 09/10/21 0500  Reticulocytes  Tomorrow morning,   R       ?Question:  Specimen collection method  Answer:  Lab=Lab collect  ? 09/09/21 1007  ? ?  ?  ? ?  ? ? ? ?DVT prophylaxis: apixaban (ELIQUIS) tablet 2.5 mg Start: 09/07/21 1000 ?SCDs Start: 09/05/21 1253 ?apixaban (ELIQUIS) tablet 2.5 mg  ? ?Code Status:   Code Status: Full Code ? ?Family Communication: Daughter and husband at bedside ?Disposition:  ? ?Status is: Inpatient ? ?Dispo: The patient is from: home ?             Anticipated d/c is to: home health ?             Anticipated d/c date is: 3/3 if hgb stable/ k ? ?Antimicrobials:   ?Anti-infectives (From admission, onward)  ? ? Start     Dose/Rate Route Frequency Ordered Stop  ? 09/06/21 1800  ceFAZolin (ANCEF) IVPB 2g/100 mL premix       ? 2 g ?200 mL/hr over 30 Minutes Intravenous Every 8 hours 09/06/21 1421 09/07/21 0640  ? 09/06/21 1000  ceFAZolin (ANCEF) IVPB 2g/100 mL premix       ? 2 g ?200 mL/hr over 30 Minutes Intravenous On call to O.R. 09/06/21 0947 09/06/21 1125  ? ?  ? ? ? ? ? ? ?Objective: ?Vitals:  ? 09/08/21 0600 09/08/21 1441 09/08/21 2100 09/09/21 0500  ?BP: (!) 162/82 135/76 (!) 106/57 (!) 150/93  ?Pulse: 78 70 82 62  ?Resp: 17 18 16 18   ?Temp: 98.5 ?F (36.9 ?C) 98.3 ?F (36.8 ?C) 98.9 ?F (37.2 ?C) 98.2 ?F (36.8 ?C)  ?TempSrc: Oral Oral Oral Oral  ?SpO2: 100% 100% 97% 99%  ?Weight:      ?Height:      ? ?No intake or output data in the 24 hours ending 09/09/21 1010 ? ?Filed Weights  ? 09/05/21 0843  ?Weight: 49.4 kg  ? ? ?Examination: ? ?General exam: Frail, appear stronger , NAD ?Respiratory system: Clear to auscultation. Respiratory effort  normal. ?Cardiovascular system:  RRR.  ?Gastrointestinal system: Abdomen is nondistended, soft and nontender.  Normal bowel sounds heard. ?Central nervous system: Alert and oriented. No focal neurological deficits. ?Extremities: Postop changes left hip ?Skin: No rashes, lesions or ulcers ?Psychiatry: Judgement and insight appear normal. Mood & affect appropriate.  ? ? ? ?Data Reviewed: I have personally reviewed  labs and visualized  imaging studies since the last encounter and formulate the plan  ? ? ? ? ? ? ?Scheduled Meds: ? apixaban  2.5 mg Oral BID  ? bisacodyl  10 mg Rectal Once  ? carvedilol  3.125 mg Oral BID  ? ezetimibe  10 mg Oral QHS  ? And  ? simvastatin  80 mg Oral QHS  ? famotidine  20 mg Oral QHS  ? feeding supplement  237 mL Oral BID BM  ? folic acid  1 mg Oral Daily  ? lidocaine  1 patch Transdermal Q24H  ? polyethylene glycol  17 g Oral Daily  ? senna-docusate  1 tablet Oral BID  ? sertraline  100 mg Oral Daily  ? sodium zirconium cyclosilicate  5 g Oral Once  ? vitamin B-12  1,000 mcg Oral Daily  ? ?Continuous Infusions: ? sodium chloride    ? ? ? LOS: 4 days  ? ? ? ?Heather Reasons, MD PhD FACP ?[facility] ? ?Available via Epic secure chat 7am-7pm for nonurgent issues ?Please page for urgent issues ?To page the attending provider between 7A-7P or the covering provider during after hours 7P-7A, please log into the web site [email or web address] and access using universal  password for that web site. If you do not have the password, please call the hospital operator. ? ? ? ?09/09/2021, 10:10 AM  ? ? ?

## 2021-09-09 NOTE — Plan of Care (Signed)
  Problem: Pain Management: Goal: Pain level will decrease Outcome: Progressing   

## 2021-09-10 LAB — HEPATIC FUNCTION PANEL
ALT: 9 U/L (ref 0–44)
AST: 16 U/L (ref 15–41)
Albumin: 2.3 g/dL — ABNORMAL LOW (ref 3.5–5.0)
Alkaline Phosphatase: 158 U/L — ABNORMAL HIGH (ref 38–126)
Bilirubin, Direct: 0.1 mg/dL (ref 0.0–0.2)
Indirect Bilirubin: 0 mg/dL — ABNORMAL LOW (ref 0.3–0.9)
Total Bilirubin: 0.1 mg/dL — ABNORMAL LOW (ref 0.3–1.2)
Total Protein: 5.6 g/dL — ABNORMAL LOW (ref 6.5–8.1)

## 2021-09-10 LAB — CBC WITH DIFFERENTIAL/PLATELET
Abs Immature Granulocytes: 0.07 10*3/uL (ref 0.00–0.07)
Basophils Absolute: 0 10*3/uL (ref 0.0–0.1)
Basophils Relative: 0 %
Eosinophils Absolute: 0.3 10*3/uL (ref 0.0–0.5)
Eosinophils Relative: 3 %
HCT: 26.8 % — ABNORMAL LOW (ref 36.0–46.0)
Hemoglobin: 8.2 g/dL — ABNORMAL LOW (ref 12.0–15.0)
Immature Granulocytes: 1 %
Lymphocytes Relative: 28 %
Lymphs Abs: 2.6 10*3/uL (ref 0.7–4.0)
MCH: 32.5 pg (ref 26.0–34.0)
MCHC: 30.6 g/dL (ref 30.0–36.0)
MCV: 106.3 fL — ABNORMAL HIGH (ref 80.0–100.0)
Monocytes Absolute: 1 10*3/uL (ref 0.1–1.0)
Monocytes Relative: 11 %
Neutro Abs: 5.2 10*3/uL (ref 1.7–7.7)
Neutrophils Relative %: 57 %
Platelets: 261 10*3/uL (ref 150–400)
RBC: 2.52 MIL/uL — ABNORMAL LOW (ref 3.87–5.11)
RDW: 13.5 % (ref 11.5–15.5)
WBC: 9.2 10*3/uL (ref 4.0–10.5)
nRBC: 0 % (ref 0.0–0.2)

## 2021-09-10 LAB — IRON AND TIBC
Iron: 22 ug/dL — ABNORMAL LOW (ref 28–170)
Saturation Ratios: 10 % — ABNORMAL LOW (ref 10.4–31.8)
TIBC: 216 ug/dL — ABNORMAL LOW (ref 250–450)
UIBC: 194 ug/dL

## 2021-09-10 LAB — BASIC METABOLIC PANEL
Anion gap: 7 (ref 5–15)
BUN: 23 mg/dL (ref 8–23)
CO2: 25 mmol/L (ref 22–32)
Calcium: 8.6 mg/dL — ABNORMAL LOW (ref 8.9–10.3)
Chloride: 106 mmol/L (ref 98–111)
Creatinine, Ser: 0.68 mg/dL (ref 0.44–1.00)
GFR, Estimated: >60 mL/min (ref >60)
Glucose, Bld: 93 mg/dL (ref 70–99)
Potassium: 5.5 mmol/L — ABNORMAL HIGH (ref 3.5–5.1)
Sodium: 138 mmol/L (ref 135–145)

## 2021-09-10 LAB — CK: Total CK: 43 U/L (ref 38–234)

## 2021-09-10 LAB — RETICULOCYTES
Immature Retic Fract: 31.3 % — ABNORMAL HIGH (ref 2.3–15.9)
RBC.: 2.48 MIL/uL — ABNORMAL LOW (ref 3.87–5.11)
Retic Count, Absolute: 71.9 10*3/uL (ref 19.0–186.0)
Retic Ct Pct: 2.9 % (ref 0.4–3.1)

## 2021-09-10 LAB — FERRITIN: Ferritin: 91 ng/mL (ref 11–307)

## 2021-09-10 MED ORDER — FOLIC ACID 1 MG PO TABS: 1.0000 mg | ORAL_TABLET | Freq: Every day | ORAL | 0 refills | Status: DC

## 2021-09-10 MED ORDER — SENNOSIDES-DOCUSATE SODIUM 8.6-50 MG PO TABS: 1.0000 | ORAL_TABLET | Freq: Every day | ORAL | 0 refills | Status: DC

## 2021-09-10 MED ORDER — ASPIRIN 81 MG PO TBEC: 81.0000 mg | DELAYED_RELEASE_TABLET | ORAL | 0 refills | Status: DC

## 2021-09-10 MED ORDER — SODIUM ZIRCONIUM CYCLOSILICATE 5 G PO PACK: 5.0000 g | PACK | Freq: Every day | ORAL | 0 refills | Status: AC

## 2021-09-10 MED ORDER — CARVEDILOL 6.25 MG PO TABS: 6.2500 mg | ORAL_TABLET | Freq: Two times a day (BID) | ORAL | 0 refills | Status: DC

## 2021-09-10 MED ORDER — CYANOCOBALAMIN 1000 MCG PO TABS: 1000.0000 ug | ORAL_TABLET | Freq: Every day | ORAL | 0 refills | Status: DC

## 2021-09-10 MED ORDER — POLYETHYLENE GLYCOL 3350 17 G PO PACK: 17.0000 g | PACK | Freq: Every day | ORAL | 0 refills | Status: DC

## 2021-09-10 MED ORDER — FERROUS SULFATE 325 (65 FE) MG PO TBEC: 325.0000 mg | DELAYED_RELEASE_TABLET | ORAL | 0 refills | Status: DC

## 2021-09-10 MED ORDER — SODIUM ZIRCONIUM CYCLOSILICATE 5 G PO PACK
5.0000 g | PACK | Freq: Every day | ORAL | Status: DC
Start: 2021-09-10 — End: 2021-09-10
  Administered 2021-09-10: 5 g via ORAL
  Filled 2021-09-10 (×2): qty 1

## 2021-09-10 MED ORDER — LEVALBUTEROL HCL 0.63 MG/3ML IN NEBU
INHALATION_SOLUTION | RESPIRATORY_TRACT | Status: AC
  Filled 2021-09-10: qty 3

## 2021-09-10 NOTE — Progress Notes (Signed)
Discharge package printed and instructions given to patient. Patient verbalizes understanding. 

## 2021-09-10 NOTE — Discharge Summary (Signed)
Discharge Summary  Heather Garrett DVV:[id number] DOB: [birth year]  PCP: [name], MD  Admit date: 09/05/2021 Discharge date: 09/10/2021  Time spent: 75mins, more than 50% time spent on coordination of care.  Recommendations for Outpatient Follow-up:  F/u with PCP within a week  for hospital discharge follow up, repeat cbc/bmp at follow up, pcp to monitor anemia and potassium level, pcp to continue adjust blood pressure meds as needed F/u with ortho Home health arranged     Discharge Diagnoses:  Active Hospital Problems   Diagnosis Date Noted   Hip fracture ([place]) 09/05/2021   Normocytic anemia 09/05/2021   Hyperkalemia 09/05/2021   Hypertensive disorder 05/17/2021   History of sleep apnea 03/23/2021   Coronary arteriosclerosis in native artery 09/28/2016   Hyperlipidemia 09/28/2016    Resolved Hospital Problems  No resolved problems to display.    Discharge Condition: stable  Diet recommendation: low potassium diet   Filed Weights   09/05/21 0843  Weight: 49.4 kg    History of present illness: ( per admitting MD Dr Olevia Bowens) HPI: Heather Garrett is a 71 y.o. female with medical history significant of biceps tendinitis, cobalamin deficiency, CAD, abnormal LFTs, hyperlipidemia, migraine headaches, rotator cuff syndrome, bladder transitional carcinoma, vitamin D deficiency who had a fall about 2 weeks ago hitting her head, but no LOC.  She did develop progressively worse left hip pain and decrease in ROM.  She has been sleeping in a recliner due to pain.  She is now having a lot of difficulty to walk.  She is needing significant assistance to stand and able.  She stated that the fall was mechanical.  No prodromal symptoms.  She denies fever, chills, productive cough, dyspnea, wheezing, chest pain, palpitations, diaphoresis, PND, orthopnea or recent lower extremity edema.  She has felt nauseous at times, but no abdominal pain, vomiting, constipation, melena or  hematochezia.  She gets occasional diarrhea.  Denied, dysuria, frequency or hematuria.  No polyuria, polydipsia, polyphagia or blurred vision.  Imaging: Left hip x-ray showed displaced fracture in the subcapital portion of the left femoral neck.  Hospital Course:  Principal Problem:   Hip fracture ([place]) Active Problems:   History of sleep apnea   Coronary arteriosclerosis in native artery   Hyperlipidemia   Hypertensive disorder   Normocytic anemia   Hyperkalemia   Assessment and Plan: No notes have been filed under this hospital service. 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, was on supplement but was stopped two months ago Acute drop of hemoglobin likely due to expected surgical blood loss Appear had anemia work-up in December 2022, at the time B12 was suboptimal in the 300 range, will start B12 supplement Iron panel showed iron deficiency, need to restart iron supplement Stool is brown, no hematuria F/u with pcp       Hyperkalemia,  Improved, Received hydration, lokelma,  low k diet Discontinue ARB Discharged on three more days of lokelma F/u with pcp    Hyponatremia, present on admission Normalized after hydration Reports was taken off HCTZ by  pcp recently     Elevated creatinine Improved after hydration Encourage oral intake  Cr peaked at 1.10, cr 0.68 after hydration F/u with pcp   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 Resume  asa after finish  eliquis x4weeks   essential hypertension: Increase Coreg to 6.25 bid, discontinue ARB due to hyperkalemia F/u with pcp.   History of sleep apnea Obstructive sleep apnea not on CPAP at night noted.     H/o bladder cancer in remission  H/o breast cancer /breast surgery, in remission   FTT, home with home health and family assistance    Nutritional Assessment: The patients BMI is: Body mass index is 24.44 kg/m.Marland Kitchen Seen by dietician.  I agree with the assessment and plan as outlined below: Nutrition Status: Nutrition Problem: Increased nutrient needs Etiology: post-op healing, hip fracture Signs/Symptoms: estimated needs Interventions: Ensure Surgery   Discharge Exam: BP (!) 143/95 (BP Location: Right Arm)    Pulse 73    Temp 98.2 F (36.8 C)    Resp 17    Ht 4\' 8"  (1.422 m)    Wt 49.4 kg    SpO2 100%    BMI 24.44 kg/m   General: NAD, aaox3 Cardiovascular: RRR Respiratory: normal respiratory effort     Discharge Instructions     Diet general   Complete by: As directed    Low potassium diet   Discharge wound care:   Complete by: As directed    Per surgery recommendation   Increase activity slowly   Complete by: As directed       Allergies as of 09/10/2021       Reactions   Contrast Media [iodinated Contrast Media] Anaphylaxis, Hives   Ciprofloxacin Other (See Comments)   Interaction w other medicine   Erythromycin Base Nausea And Vomiting   Sulfabenzamide Other (See Comments)   UNK reaction        Medication List     STOP taking these medications    ibuprofen 200 MG tablet Commonly known as: ADVIL   valsartan 320 MG tablet Commonly known as: DIOVAN       TAKE these medications    acetaminophen 500 MG tablet Commonly known as: TYLENOL Take 1,000 mg by mouth every 6 (six) hours as needed for mild pain.   apixaban 2.5 MG Tabs tablet Commonly known as: Eliquis Take 1 tablet (2.5 mg total) by mouth 2 (two) times daily for 26  days.   aspirin 81 MG EC tablet Take 1 tablet (81 mg total) by mouth every other day. Swallow whole.  Hold aspirin while taking eliquis, What changed: additional instructions   carvedilol 6.25 MG tablet Commonly known as: Coreg Take 1 tablet (6.25 mg total) by mouth 2 (two) times daily. What changed:  medication strength how much to take   cyanocobalamin 1000 MCG tablet Take 1 tablet (1,000 mcg total) by mouth daily. Start taking on: September 11, 2021   ezetimibe-simvastatin 10-80 MG tablet Commonly known as: VYTORIN Take 1 tablet by mouth at bedtime.   famotidine 40 MG tablet Commonly known as: PEPCID Take 40 mg by mouth daily as needed for heartburn or indigestion.   ferrous sulfate 325 (65 FE) MG EC tablet Take 1 tablet (325 mg total) by mouth every Monday, Wednesday, and Friday.   folic acid 1 MG tablet Commonly known as: FOLVITE Take 1 tablet (1 mg total) by mouth daily. Start taking on: September 11, 2021  hydrochlorothiazide 25 MG tablet Commonly known as: HYDRODIURIL Take 25 mg by mouth daily as needed (swelling/fluid).   lidocaine 5 % ointment Commonly known as: XYLOCAINE Apply 1 application topically as needed. What changed:  when to take this reasons to take this   Nurtec 75 MG Tbdp Generic drug: Rimegepant Sulfate Take 75 mg by mouth daily as needed for headache.   oxyCODONE 5 MG immediate release tablet Commonly known as: Roxicodone Take 1 tablet (5 mg total) by mouth every 4 (four) hours as needed for up to 7 days for severe pain or moderate pain.   polyethylene glycol 17 g packet Commonly known as: MIRALAX / GLYCOLAX Take 17 g by mouth daily. Start taking on: September 11, 2021   senna-docusate 8.6-50 MG tablet Commonly known as: Senokot-S Take 1 tablet by mouth at bedtime.   sertraline 100 MG tablet Commonly known as: ZOLOFT Take 100 mg by mouth daily.   sodium zirconium cyclosilicate 5 g packet Commonly known as: LOKELMA Take 5 g by mouth daily  for 3 days.   tiZANidine 4 MG tablet Commonly known as: ZANAFLEX Take 4 mg by mouth at bedtime.               Discharge Care Instructions  (From admission, onward)           Start     Ordered   09/10/21 0000  Discharge wound care:       Comments: Per surgery recommendation   09/10/21 1010           Allergies  Allergen Reactions   Contrast Media [Iodinated Contrast Media] Anaphylaxis and Hives   Ciprofloxacin Other (See Comments)    Interaction w other medicine   Erythromycin Base Nausea And Vomiting   Sulfabenzamide Other (See Comments)    UNK reaction    Follow-up Information     Willaim Sheng, MD Follow up in 2 week(s).   Specialty: Orthopedic Surgery Contact information: [address] [place] [address] [phone]         [name], MD Follow up on 09/15/2021.   Specialty: Family Medicine Why: hospital discharge follow up, repeat basic lab works including cbc, bmp. pcp to monitor anemia and potassium level please follow up with your pcp for blood pressure medication adjustment Contact information: [address] [place] [address] [phone]         Jerline Pain, MD .   Specialty: Cardiology Contact information: [address] [place] [address] [phone]                  The results of significant diagnostics from this hospitalization (including imaging, microbiology, ancillary and laboratory) are listed below for reference.    Significant Diagnostic Studies: MR PELVIS W WO CONTRAST  Result Date: 08/17/2021 CLINICAL DATA:  History of bladder cancer EXAM: MRI ABDOMEN AND PELVIS WITHOUT AND WITH CONTRAST TECHNIQUE: Multiplanar multisequence MR imaging of the abdomen and pelvis was performed both before and after the administration of intravenous contrast. CONTRAST:  37mL GADAVIST GADOBUTROL 1 MMOL/ML IV SOLN COMPARISON:  01/26/2021 FINDINGS: COMBINED FINDINGS FOR BOTH MR  ABDOMEN AND PELVIS Lower chest: No acute findings. Hepatobiliary: Hepatic steatosis. No mass or other parenchymal abnormality identified. Status post cholecystectomy. Mild postoperative biliary ductal dilatation. Pancreas: No mass, inflammatory changes, or other parenchymal abnormality identified.No pancreatic ductal dilatation. Spleen: Within normal limits in size and appearance. Small accessory splenule. Adrenals/Urinary Tract: Normal adrenal glands. Postoperative findings  of partial superior pole right nephrectomy. No renal masses or suspicious contrast enhancement identified. Multiple small simple, benign bilateral renal cysts. No evidence of hydronephrosis. Normal urinary bladder. No mass or wall thickening identified. Stomach/Bowel: Status post partial sleeve gastrectomy. Small hiatal hernia. Visualized portions within the abdomen are otherwise unremarkable. Vascular/Lymphatic: No pathologically enlarged lymph nodes identified. Aortic atherosclerosis. No abdominal aortic aneurysm demonstrated. Reproductive: Other:  None. Musculoskeletal: No suspicious osseous lesions identified. Extensive marrow edema involving the bilateral sacral ala (series 4, image 31). There is new, severe collapse of the left femoral head with a serpiginous region of low signal and underlying marrow edema and enhancement (series 3, image 23). IMPRESSION: 1. No evidence of mass, lymphadenopathy, or metastatic disease in the abdomen or pelvis. Specifically no mass or other abnormality of the urinary bladder. 2. Postoperative findings of partial superior pole right nephrectomy. No evidence of local malignant recurrence. 3. Extensive marrow edema involving the bilateral sacral ala, most consistent with acute to subacute insufficiency fractures. 4. There is new, severe collapse of the left femoral head with a serpiginous region of low signal and underlying marrow edema and enhancement, consistent with avascular necrosis and acute to subacute  femoral head collapse. 5. Hepatic steatosis. 6. Status post partial sleeve gastrectomy. Small hiatal hernia. Aortic Atherosclerosis (ICD10-I70.0). Electronically Signed   By: Delanna Ahmadi M.D.   On: 08/17/2021 14:37   MR ABDOMEN WWO CONTRAST  Result Date: 08/17/2021 CLINICAL DATA:  History of bladder cancer EXAM: MRI ABDOMEN AND PELVIS WITHOUT AND WITH CONTRAST TECHNIQUE: Multiplanar multisequence MR imaging of the abdomen and pelvis was performed both before and after the administration of intravenous contrast. CONTRAST:  41mL GADAVIST GADOBUTROL 1 MMOL/ML IV SOLN COMPARISON:  01/26/2021 FINDINGS: COMBINED FINDINGS FOR BOTH MR ABDOMEN AND PELVIS Lower chest: No acute findings. Hepatobiliary: Hepatic steatosis. No mass or other parenchymal abnormality identified. Status post cholecystectomy. Mild postoperative biliary ductal dilatation. Pancreas: No mass, inflammatory changes, or other parenchymal abnormality identified.No pancreatic ductal dilatation. Spleen: Within normal limits in size and appearance. Small accessory splenule. Adrenals/Urinary Tract: Normal adrenal glands. Postoperative findings of partial superior pole right nephrectomy. No renal masses or suspicious contrast enhancement identified. Multiple small simple, benign bilateral renal cysts. No evidence of hydronephrosis. Normal urinary bladder. No mass or wall thickening identified. Stomach/Bowel: Status post partial sleeve gastrectomy. Small hiatal hernia. Visualized portions within the abdomen are otherwise unremarkable. Vascular/Lymphatic: No pathologically enlarged lymph nodes identified. Aortic atherosclerosis. No abdominal aortic aneurysm demonstrated. Reproductive: Other:  None. Musculoskeletal: No suspicious osseous lesions identified. Extensive marrow edema involving the bilateral sacral ala (series 4, image 31). There is new, severe collapse of the left femoral head with a serpiginous region of low signal and underlying marrow edema and  enhancement (series 3, image 23). IMPRESSION: 1. No evidence of mass, lymphadenopathy, or metastatic disease in the abdomen or pelvis. Specifically no mass or other abnormality of the urinary bladder. 2. Postoperative findings of partial superior pole right nephrectomy. No evidence of local malignant recurrence. 3. Extensive marrow edema involving the bilateral sacral ala, most consistent with acute to subacute insufficiency fractures. 4. There is new, severe collapse of the left femoral head with a serpiginous region of low signal and underlying marrow edema and enhancement, consistent with avascular necrosis and acute to subacute femoral head collapse. 5. Hepatic steatosis. 6. Status post partial sleeve gastrectomy. Small hiatal hernia. Aortic Atherosclerosis (ICD10-I70.0). Electronically Signed   By: Delanna Ahmadi M.D.   On: 08/17/2021 14:37   CT HIP LEFT WO  CONTRAST  Result Date: 09/06/2021 CLINICAL DATA:  Fracture.  Evaluate superior pubic ramus fracture. EXAM: CT OF THE LEFT HIP WITHOUT CONTRAST TECHNIQUE: Multidetector CT imaging of the left hip was performed according to the standard protocol. Multiplanar CT image reconstructions were also generated. RADIATION DOSE REDUCTION: This exam was performed according to the departmental dose-optimization program which includes automated exposure control, adjustment of the mA and/or kV according to patient size and/or use of iterative reconstruction technique. COMPARISON:  Pelvis and left hip radiographs 09/06/2021 (multiple studies); 09/05/2021; MRI pelvis 08/27/2021 FINDINGS: Bones/Joint/Cartilage There is curvilinear lucency within the left superior pubic ramus near the pubic body. Persistent fracture line lucency moderate peripheral fracture border sclerosis suggesting a subacute process. Note is made there may be a nondisplaced fracture on 08/27/20 prior MRI. There is moderate healing sclerosis of the left inferior pubic ramus nondisplaced subacute  fracture. Interval total left hip arthroplasty. No perihardware lucency is seen to indicate hardware failure or loosening within limitations of metallic streak artifact. Mild partially visualized left sacroiliac joint space narrowing, subchondral sclerosis and degenerative vacuum phenomenon. Mild pubic symphysis joint space narrowing. Ligaments Suboptimally assessed by CT. Muscles and Tendons No significant muscle atrophy. Soft tissues Expected postoperative changes of recent total left hip arthroplasty are seen with lateral left hip subcutaneous fat moderate edema and swelling and scattered subcutaneous air. There is left femoroacetabular joint space postoperative air that also mildly tracts along the left iliopsoas muscle and tendon. Moderate to high-grade atherosclerotic calcifications. A Foley catheter is seen within decompressed bladder. IMPRESSION:: IMPRESSION: 1. Status post recent total left hip arthroplasty. No evidence of hardware failure. 2. Likely subacute left superior and inferior pubic ramus fractures. Electronically Signed   By: Yvonne Kendall M.D.   On: 09/06/2021 19:55   Chest Portable 1 View  Result Date: 09/05/2021 CLINICAL DATA:  Smoking history. Preoperative exam for hip surgery for left femoral neck fracture EXAM: PORTABLE CHEST 1 VIEW COMPARISON:  01/02/2021 FINDINGS: The heart size and mediastinal contours are within normal limits. Aortic atherosclerosis. Ovoid area of subtly increased density projecting over the mid left lung measuring up to 15 mm, indeterminate. 8 mm rounded calcification projects over the left lower lobe compatible with soft tissue calcification of the left chest wall seen on CT. Lungs are otherwise clear. No pleural effusion or pneumothorax. The visualized skeletal structures are unremarkable. IMPRESSION: 1. Ovoid area of subtly increased density projecting over the mid left lung, possibly representing nipple shadow, overlapping rib, versus pulmonary nodule. Recommend  repeat PA and lateral radiographs of the chest with nipple markers to further evaluate. 2. Otherwise, no acute cardiopulmonary findings. Electronically Signed   By: Davina Poke D.O.   On: 09/05/2021 13:41   MYOCARDIAL PERFUSION IMAGING  Result Date: 08/13/2021   Findings are consistent with prior myocardial infarction. The study is intermediate risk.   No ST deviation was noted.   LV perfusion is abnormal. There is no evidence of ischemia. There is evidence of infarction. Defect 1: There is a medium defect with moderate reduction in uptake present in the apical to mid anterior, anteroseptal and apex location(s) that is fixed. There is abnormal wall motion in the defect area. Consistent with infarction.   Left ventricular function is normal. Nuclear stress EF: 66 %. The left ventricular ejection fraction is hyperdynamic (>65%). End diastolic cavity size is normal. End systolic cavity size is normal.   Prior study not available for comparison. Fixed defect in the mid to apical anterior, anteroseptal, and apical segment.  This does not worsen with stress. Wall motion mildly abnormal in this area. Suggests prior infarct, no evidence of ischemia.   DG HIP UNILAT WITH PELVIS 1V LEFT  Result Date: 09/06/2021 CLINICAL DATA:  Status post left hip arthroplasty EXAM: DG HIP (WITH OR WITHOUT PELVIS) 1V*L* COMPARISON:  Intraoperative hip radiographs obtained earlier the same day, hip radiographs 09/05/2021 FINDINGS: Postsurgical changes reflecting left hip arthroplasty are seen. Hardware alignment is within expected limits, without evidence of complication. There is expected surrounding postoperative soft tissue gas. There is an additional fracture of the left superior pubic ramus which was not definitely seen on the radiograph from 06/30/2021, and possible subacute fracture of the left inferior pubic ramus. Mild degenerative changes about the right hip are stable. IMPRESSION: 1. Status post left hip arthroplasty  without evidence of complication. 2. Suspected additional fractures of the left superior and inferior pubic rami, not seen on the prior radiographs from 06/30/2021. Recommend CT pelvis for further evaluation. These results will be called to the ordering clinician or representative by the Radiologist Assistant, and communication documented in the PACS or [employer]. Electronically Signed   By: Valetta Mole M.D.   On: 09/06/2021 15:59   DG HIP UNILAT WITH PELVIS 1V LEFT  Result Date: 09/06/2021 CLINICAL DATA:  Status post left hip replacement. EXAM: DG HIP (WITH OR WITHOUT PELVIS) 1V*L* COMPARISON:  September 05, 2021. FINDINGS: The left femoral and acetabular components are well situated. Expected postoperative changes are seen in the surrounding soft tissues. IMPRESSION: Status post left total hip arthroplasty. Electronically Signed   By: Marijo Conception M.D.   On: 09/06/2021 12:25   DG Hip Unilat W or Wo Pelvis 2-3 Views Left  Result Date: 09/05/2021 CLINICAL DATA:  Trauma, fall, pain EXAM: DG HIP (WITH OR WITHOUT PELVIS) 2-3V LEFT COMPARISON:  06/30/2021 FINDINGS: Fracture is seen in the subcapital portion of neck of left femur. There is overriding of fracture fragments. Flattening and sclerosis seen in the left femoral head suggests possible avascular necrosis. Arterial calcifications are seen in the soft tissues. IMPRESSION: Displaced fracture is seen in the subcapital portion of neck of left femur. Electronically Signed   By: Elmer Picker M.D.   On: 09/05/2021 09:38    Microbiology: Recent Results (from the past 240 hour(s))  Resp Panel by RT-PCR (Flu A&B, Covid) Nasopharyngeal Swab     Status: None   Collection Time: 09/05/21 11:05 AM   Specimen: Nasopharyngeal Swab; Nasopharyngeal(NP) swabs in vial transport medium  Result Value Ref Range Status   SARS Coronavirus 2 by RT PCR NEGATIVE NEGATIVE Final    Comment: (NOTE) SARS-CoV-2 target nucleic acids are NOT DETECTED.  The  SARS-CoV-2 RNA is generally detectable in upper respiratory specimens during the acute phase of infection. The lowest concentration of SARS-CoV-2 viral copies this assay can detect is 138 copies/mL. A negative result does not preclude SARS-Cov-2 infection and should not be used as the sole basis for treatment or other patient management decisions. A negative result may occur with  improper specimen collection/handling, submission of specimen other than nasopharyngeal swab, presence of viral mutation(s) within the areas targeted by this assay, and inadequate number of viral copies(<138 copies/mL). A negative result must be combined with clinical observations, patient history, and epidemiological information. The expected result is Negative.  Fact Sheet for Patients:  EntrepreneurPulse.com.au  Fact Sheet for Healthcare Providers:  IncredibleEmployment.be  This test is no t yet approved or cleared by the [place] FDA and  has  been authorized for detection and/or diagnosis of SARS-CoV-2 by FDA under an Emergency Use Authorization (EUA). This EUA will remain  in effect (meaning this test can be used) for the duration of the COVID-19 declaration under Section 564(b)(1) of the Act, 21 U.S.C.section 360bbb-3(b)(1), unless the authorization is terminated  or revoked sooner.       Influenza A by PCR NEGATIVE NEGATIVE Final   Influenza B by PCR NEGATIVE NEGATIVE Final    Comment: (NOTE) The Xpert Xpress SARS-CoV-2/FLU/RSV plus assay is intended as an aid in the diagnosis of influenza from Nasopharyngeal swab specimens and should not be used as a sole basis for treatment. Nasal washings and aspirates are unacceptable for Xpert Xpress SARS-CoV-2/FLU/RSV testing.  Fact Sheet for Patients: EntrepreneurPulse.com.au  Fact Sheet for Healthcare Providers: IncredibleEmployment.be  This test is not yet approved or  cleared by the [place] FDA and has been authorized for detection and/or diagnosis of SARS-CoV-2 by FDA under an Emergency Use Authorization (EUA). This EUA will remain in effect (meaning this test can be used) for the duration of the COVID-19 declaration under Section 564(b)(1) of the Act, 21 U.S.C. section 360bbb-3(b)(1), unless the authorization is terminated or revoked.  Performed at [facility], [address], [place], [place] [address]   Surgical pcr screen     Status: None   Collection Time: 09/05/21  1:32 PM   Specimen: Nasal Mucosa; Nasal Swab  Result Value Ref Range Status   MRSA, PCR NEGATIVE NEGATIVE Final   Staphylococcus aureus NEGATIVE NEGATIVE Final    Comment: (NOTE) The Xpert SA Assay (FDA approved for NASAL specimens in patients 13 years of age and older), is one component of a comprehensive surveillance program. It is not intended to diagnose infection nor to guide or monitor treatment. Performed at [facility], [place] [address], [place], [place] [address]      Labs: Basic Metabolic Panel: Recent Labs  Lab 09/05/21 1538 09/06/21 0324 09/07/21 0646 09/09/21 0336 09/10/21 0325  NA 134* 136 136 136 138  K 5.6* 4.7 4.7 5.7* 5.5*  CL 107 110 105 105 106  CO2 20* 22 22 26 25   GLUCOSE 101* 99 139* 95 93  BUN 22 24* 24* 28* 23  CREATININE 1.10* 0.90 0.83 1.00 0.68  CALCIUM 8.7* 8.9 8.8* 8.6* 8.6*  MG 2.4  --   --   --   --    Liver Function Tests: Recent Labs  Lab 09/05/21 1538 09/10/21 0325  AST 22 16  ALT 17 9  ALKPHOS 282* 158*  BILITOT 0.3 0.1*  PROT 6.8 5.6*  ALBUMIN 3.0* 2.3*   No results for input(s): LIPASE, AMYLASE in the last 168 hours. No results for input(s): AMMONIA in the last 168 hours. CBC: Recent Labs  Lab 09/05/21 1105 09/06/21 0324 09/07/21 0646 09/08/21 0319 09/09/21 0336 09/10/21 0325  WBC 9.3 6.9 11.9* 10.4 11.1* 9.2  NEUTROABS 6.3  --   --  6.3  --  5.2  HGB 10.4*  10.1* 10.0* 8.7* 7.7* 8.2*  HCT 32.4* 31.3* 30.7* 27.8* 24.0* 26.8*  MCV 100.6* 104.3* 102.7* 105.7* 104.3* 106.3*  PLT 287 267 300 232 222 261   Cardiac Enzymes: Recent Labs  Lab 09/10/21 0325  CKTOTAL 43   BNP: BNP (last 3 results) No results for input(s): BNP in the last 8760 hours.  ProBNP (last 3 results) Recent Labs    11/25/20 1219  PROBNP 1,702*    CBG: No results for input(s): GLUCAP  in the last 168 hours.  FURTHER DISCHARGE INSTRUCTIONS:   Get Medicines reviewed and adjusted: Please take all your medications with you for your next visit with your Primary MD   Laboratory/radiological data: Please request your Primary MD to go over all hospital tests and procedure/radiological results at the follow up, please ask your Primary MD to get all Hospital records sent to his/her office.   In some cases, they will be blood work, cultures and biopsy results pending at the time of your discharge. Please request that your primary care M.D. goes through all the records of your hospital data and follows up on these results.   Also Note the following: If you experience worsening of your admission symptoms, develop shortness of breath, life threatening emergency, suicidal or homicidal thoughts you must seek medical attention immediately by calling 911 or calling your MD immediately  if symptoms less severe.   You must read complete instructions/literature along with all the possible adverse reactions/side effects for all the Medicines you take and that have been prescribed to you. Take any new Medicines after you have completely understood and accpet all the possible adverse reactions/side effects.    Do not drive when taking Pain medications or sleeping medications (Benzodaizepines)   Do not take more than prescribed Pain, Sleep and Anxiety Medications. It is not advisable to combine anxiety,sleep and pain medications without talking with your primary care practitioner   Special  Instructions: If you have smoked or chewed Tobacco  in the last 2 yrs please stop smoking, stop any regular Alcohol  and or any Recreational drug use.   Wear Seat belts while driving.   Please note: You were cared for by a hospitalist during your hospital stay. Once you are discharged, your primary care physician will handle any further medical issues. Please note that NO REFILLS for any discharge medications will be authorized once you are discharged, as it is imperative that you return to your primary care physician (or establish a relationship with a primary care physician if you do not have one) for your post hospital discharge needs so that they can reassess your need for medications and monitor your lab values.     Signed:  Florencia Reasons MD, PhD, FACP  [facility] 09/10/2021, 10:49 AM

## 2021-09-10 NOTE — Plan of Care (Signed)
?  Problem: Pain Management: ?Goal: Pain level will decrease ?Outcome: Progressing ?  ?Problem: Activity: ?Goal: Risk for activity intolerance will decrease ?Outcome: Progressing ?  ?Problem: Safety: ?Goal: Ability to remain free from injury will improve ?Outcome: Progressing ?  ?

## 2021-09-10 NOTE — Progress Notes (Signed)
Physical Therapy Treatment ?Patient Details ?Name: Heather Garrett ?MRN: [id number] ?DOB: 1950-10-14 ?Today's Date: 09/10/2021 ? ? ?History of Present Illness Pt is a 71 year old female s/p left THA for femoral neck fracture on 2/27 and also found to have Subacute nondisplaced left superior pubic rami fx - confirmed on CT scan overnight. Stable fx. Okay for WBAT with walker per ortho. ? ?  ?PT Comments  ? ? Pt is progressing well with mobility and is ready to DC home from a PT standpoint. She ambulated 63' with RW, no loss of balance. Reviewed posterior precautions in detail and reviewed THA HEP. Pt stated she has no steps to enter her home, no stairs inside, stair training was deferred.  ?   ?Recommendations for follow up therapy are one component of a multi-disciplinary discharge planning process, led by the attending physician.  Recommendations may be updated based on patient status, additional functional criteria and insurance authorization. ? ?Follow Up Recommendations ? Home health PT ?  ?  ?Assistance Recommended at Discharge Set up Supervision/Assistance  ?Patient can return home with the following A little help with bathing/dressing/bathroom;A little help with walking and/or transfers;Help with stairs or ramp for entrance;Assist for transportation ?  ?Equipment Recommendations ? None recommended by PT  ?  ?Recommendations for Other Services   ? ? ?  ?Precautions / Restrictions Precautions ?Precautions: Fall;Posterior Hip ?Precaution Booklet Issued: Yes (comment) ?Precaution Comments: Pt able to recall 1/3 posterior precautions. reviewed posterior hip precautions in detail, handout issued. ?Restrictions ?LLE Weight Bearing: Weight bearing as tolerated ?Other Position/Activity Restrictions: Lt LE WBAT with RW  ?  ? ?Mobility ? Bed Mobility ?Overal bed mobility: Needs Assistance ?Bed Mobility: Supine to Sit, Sit to Supine ?  ?  ?Supine to sit: Supervision ?Sit to supine: Min assist ?  ?General bed mobility  comments: pt able to move L LE off bed without gait belt, min A for LLE into bed ?  ? ?Transfers ?Overall transfer level: Needs assistance ?Equipment used: Rolling walker (2 wheels) ?Transfers: Sit to/from Stand ?Sit to Stand: Min guard ?  ?  ?  ?  ?  ?General transfer comment: verbal cues for UE and LE positioning ?  ? ?Ambulation/Gait ?Ambulation/Gait assistance: Min guard ?Gait Distance (Feet): 110 Feet ?Assistive device: Rolling walker (2 wheels) ?Gait Pattern/deviations: Step-to pattern, Decreased stance time - left ?Gait velocity: decr ?  ?  ?General Gait Details: verbal cues for RW positioning, posture, step length, maintaining posterior hip precautions with turning ? ? ?Stairs ?  ?  ?  ?  ?  ? ? ?Wheelchair Mobility ?  ? ?Modified Rankin (Stroke Patients Only) ?  ? ? ?  ?Balance Overall balance assessment: History of Falls ?  ?  ?  ?  ?  ?  ?  ?  ?  ?  ?  ?  ?  ?  ?  ?  ?  ?  ?  ? ?  ?Cognition Arousal/Alertness: Awake/alert ?Behavior During Therapy: [facility] for tasks assessed/performed ?Overall Cognitive Status: Within Functional Limits for tasks assessed ?  ?  ?  ?  ?  ?  ?  ?  ?  ?  ?  ?  ?  ?  ?  ?  ?  ?  ?  ? ?  ?Exercises Total Joint Exercises ?Ankle Circles/Pumps: AROM, Both, 10 reps ?Quad Sets: AROM, Both, 5 reps ?Short Arc Quad: AROM, Left, 5 reps, Supine ?Heel Slides: AAROM, Left, 10 reps, Limitations ?Heel  Slides Limitations: all exercises within posterior hip precautions ?Hip ABduction/ADduction: Left, 10 reps, AAROM, Supine ?Long Arc Quad: AROM, 10 reps, Seated, Left ? ?  ?General Comments   ?  ?  ? ?Pertinent Vitals/Pain Pain Assessment ?Pain Score: 4  ?Pain Location: left hip ?Pain Descriptors / Indicators: Sore, Aching ?Pain Intervention(s): Limited activity within patient's tolerance, Monitored during session, Repositioned (pt declined ice)  ? ? ?Home Living   ?  ?  ?  ?  ?  ?  ?  ?  ?  ?   ?  ?Prior Function    ?  ?  ?   ? ?PT Goals (current goals can now be found in the care plan section)  Acute Rehab PT Goals ?PT Goal Formulation: With patient ?Time For Goal Achievement: 09/14/21 ?Potential to Achieve Goals: Good ?Progress towards PT goals: Progressing toward goals ? ?  ?Frequency ? ? ? 7X/week ? ? ? ?  ?PT Plan Current plan remains appropriate  ? ? ?Co-evaluation   ?  ?  ?  ?  ? ?  ?AM-PAC PT "6 Clicks" Mobility   ?Outcome Measure ? Help needed turning from your back to your side while in a flat bed without using bedrails?: None ?Help needed moving from lying on your back to sitting on the side of a flat bed without using bedrails?: A Little ?Help needed moving to and from a bed to a chair (including a wheelchair)?: A Little ?Help needed standing up from a chair using your arms (e.g., wheelchair or bedside chair)?: A Little ?Help needed to walk in hospital room?: A Little ?Help needed climbing 3-5 steps with a railing? : A Little ?6 Click Score: 19 ? ?  ?End of Session Equipment Utilized During Treatment: Gait belt ?Activity Tolerance: Patient tolerated treatment well ?Patient left: with call bell/phone within reach;in bed;with bed alarm set ?  ?PT Visit Diagnosis: Other abnormalities of gait and mobility (R26.89) ?  ? ? ?Time: [phone] ?PT Time Calculation (min) (ACUTE ONLY): 23 min ? ?Charges:  $Gait Training: 8-22 mins ?$Therapeutic Exercise: 8-22 mins          ?          ? ?Philomena Doheny PT 09/10/2021  ?Acute Rehabilitation Services ?Pager [phone] ?Office [phone] ? ? ?

## 2021-11-01 ENCOUNTER — Ambulatory Visit: Payer: Medicare PPO | Admitting: Physician Assistant

## 2021-11-01 ENCOUNTER — Ambulatory Visit (INDEPENDENT_AMBULATORY_CARE_PROVIDER_SITE_OTHER)
Admission: RE | Admit: 2021-11-01 | Discharge: 2021-11-01 | Disposition: A | Discharge status: Home / Self Care | Payer: Medicare PPO | Source: Ambulatory Visit | Attending: Physician Assistant | Admitting: Physician Assistant

## 2021-11-01 ENCOUNTER — Other Ambulatory Visit (INDEPENDENT_AMBULATORY_CARE_PROVIDER_SITE_OTHER): Payer: Medicare PPO

## 2021-11-01 ENCOUNTER — Encounter: Payer: Self-pay | Admitting: Physician Assistant

## 2021-11-01 VITALS — BP 162/72 | HR 68 | Ht <= 58 in | Wt 116.0 lb

## 2021-11-01 DIAGNOSIS — R1013 Epigastric pain: Secondary | ICD-10-CM

## 2021-11-01 DIAGNOSIS — D509 Iron deficiency anemia, unspecified: Secondary | ICD-10-CM

## 2021-11-01 DIAGNOSIS — Z9884 Bariatric surgery status: Secondary | ICD-10-CM

## 2021-11-01 DIAGNOSIS — Z86010 Personal history of colonic polyps: Secondary | ICD-10-CM

## 2021-11-01 DIAGNOSIS — R7989 Other specified abnormal findings of blood chemistry: Secondary | ICD-10-CM

## 2021-11-01 DIAGNOSIS — R197 Diarrhea, unspecified: Secondary | ICD-10-CM | POA: Diagnosis not present

## 2021-11-01 DIAGNOSIS — Z860101 Personal history of adenomatous and serrated colon polyps: Secondary | ICD-10-CM

## 2021-11-01 LAB — CBC WITH DIFFERENTIAL/PLATELET
Basophils Absolute: 0.1 10*3/uL (ref 0.0–0.1)
Basophils Relative: 1.1 % (ref 0.0–3.0)
Eosinophils Absolute: 0.4 10*3/uL (ref 0.0–0.7)
Eosinophils Relative: 4.5 % (ref 0.0–5.0)
HCT: 37.6 % (ref 36.0–46.0)
Hemoglobin: 12.1 g/dL (ref 12.0–15.0)
Lymphocytes Relative: 25.1 % (ref 12.0–46.0)
Lymphs Abs: 2.3 10*3/uL (ref 0.7–4.0)
MCHC: 32.3 g/dL (ref 30.0–36.0)
MCV: 101.2 fl — ABNORMAL HIGH (ref 78.0–100.0)
Monocytes Absolute: 0.8 10*3/uL (ref 0.1–1.0)
Monocytes Relative: 8.6 % (ref 3.0–12.0)
Neutro Abs: 5.5 10*3/uL (ref 1.4–7.7)
Neutrophils Relative %: 60.7 % (ref 43.0–77.0)
Platelets: 185 10*3/uL (ref 150.0–400.0)
RBC: 3.71 Mil/uL — ABNORMAL LOW (ref 3.87–5.11)
RDW: 16.2 % — ABNORMAL HIGH (ref 11.5–15.5)
WBC: 9 10*3/uL (ref 4.0–10.5)

## 2021-11-01 LAB — VITAMIN B12: Vitamin B-12: 406 pg/mL (ref 211–911)

## 2021-11-01 IMAGING — DX DG ABDOMEN 1V
2 series · 2 of 2 positions shown · non-contrast
Comparison: None.

CLINICAL DATA: Evaluate for stool burden with chronic diarrhea

EXAM:
ABDOMEN - 1 VIEW

[abdomen kub (1 of 2)]
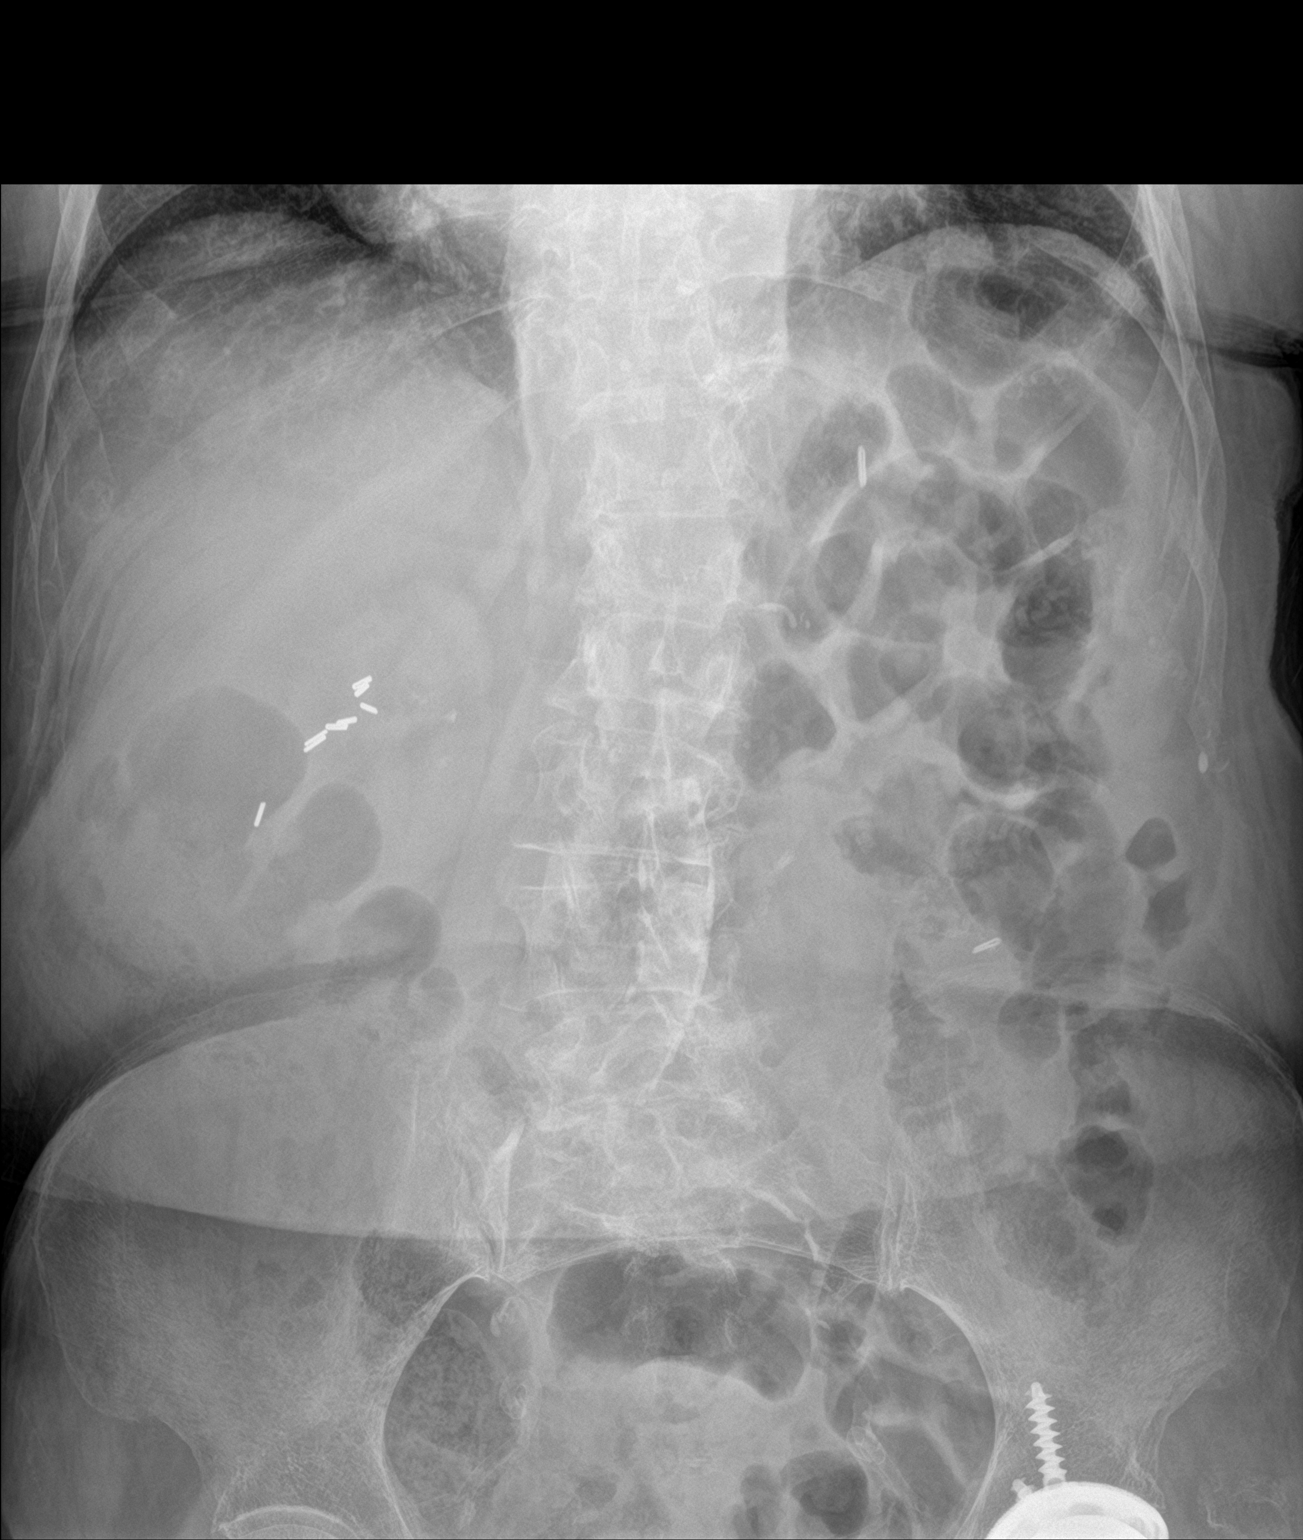

[abdomen kub (2 of 2)]
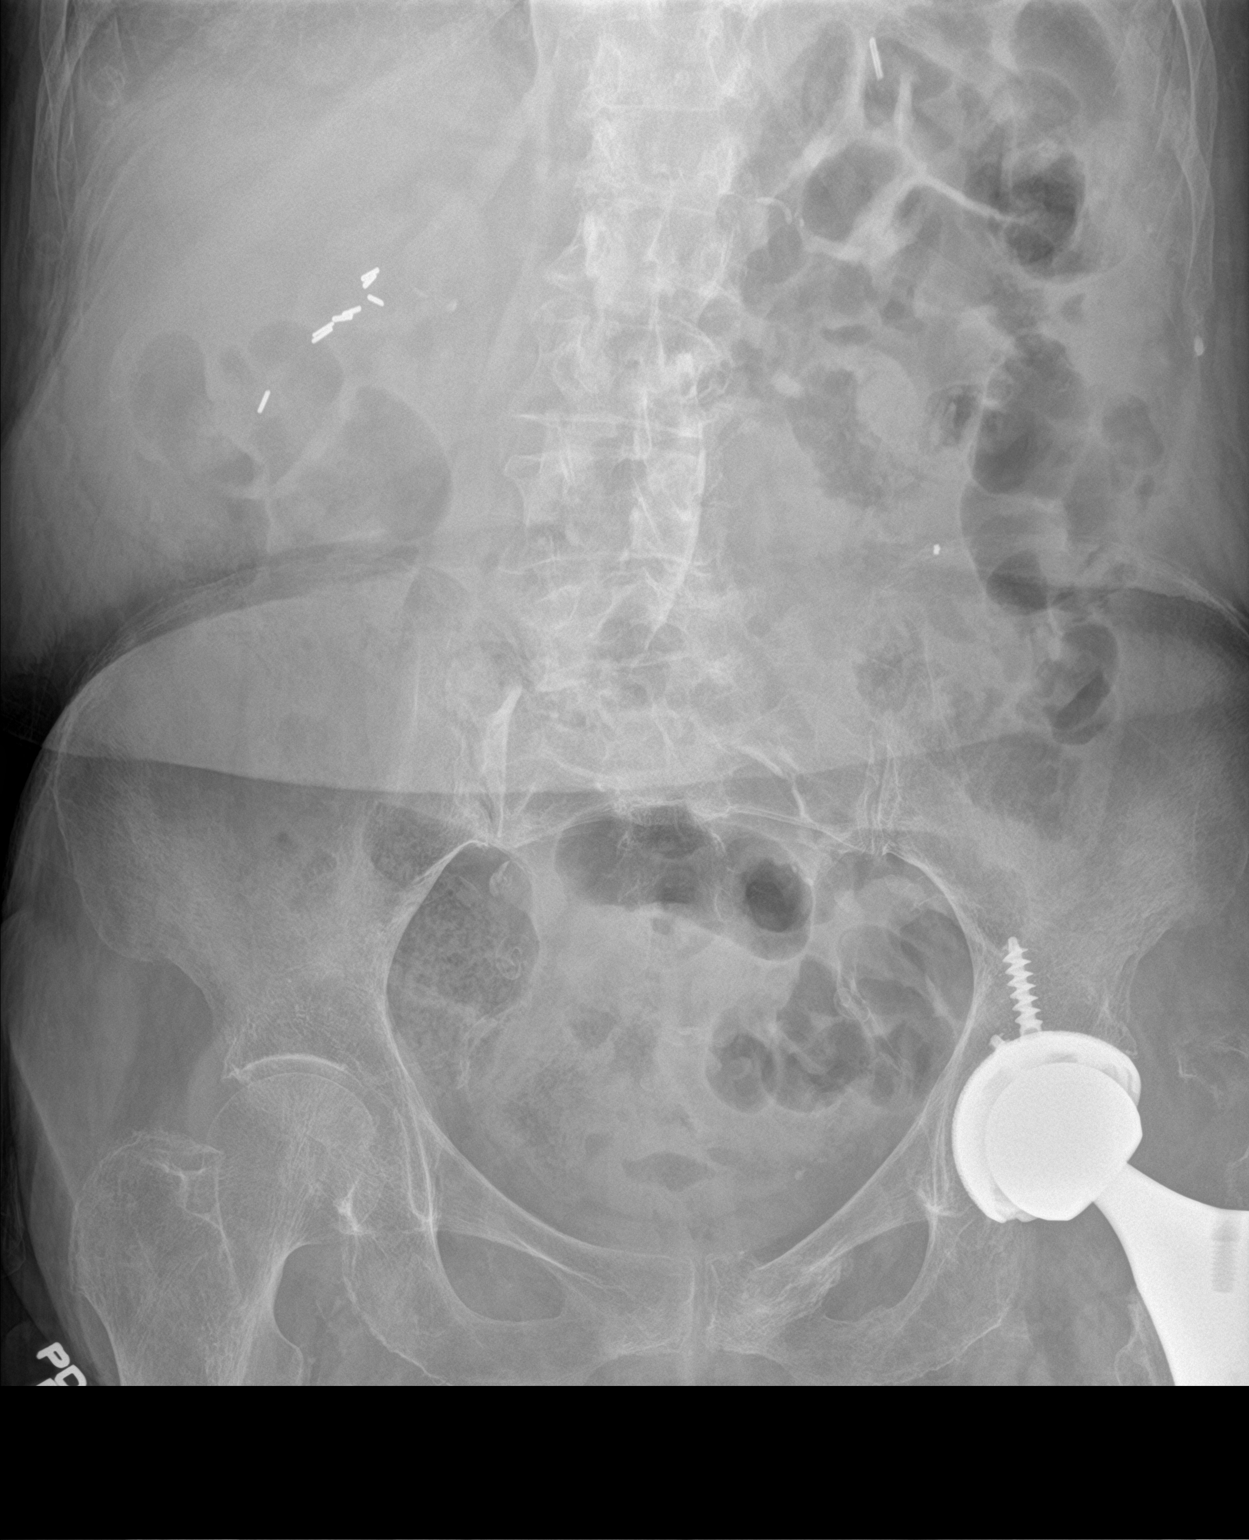

[2 of 2 positions shown; findings below may reference images not displayed]

FINDINGS: The bowel gas pattern is normal. Moderate bowel content is
identified throughout colon. Prior cholecystectomy clips are
identified. Scoliosis of the spine is identified. No radio-opaque
calculi or other significant radiographic abnormality are seen.
IMPRESSION: No bowel obstruction. Moderate bowel content identified throughout
colon.

## 2021-11-01 MED ORDER — PANTOPRAZOLE SODIUM 40 MG PO TBEC: 40.0000 mg | DELAYED_RELEASE_TABLET | Freq: Two times a day (BID) | ORAL | 2 refills | Status: DC

## 2021-11-01 NOTE — Progress Notes (Signed)
? ? ?11/01/2021 ?Heather Garrett ?[id number] ?Feb 22, 1951 ? ?Referring provider: [name], MD ?Primary GI doctor: Dr. Candis Garrett ? ?ASSESSMENT AND PLAN:  ? ?Elevated liver function tests ?-     Comprehensive metabolic panel ?Normal liver studies so far: Iron, ferritin. , hepatitis panel, ANA, alpha 1 antitrypsin, ceruloplasmin, mitochondrial antibody, Anti-smooth muscle antibody, TTG/IGA, IGG, and TSH ?Likely due to alcohol consumption and fatty liver. ?ETOH cessation discussed and encouraged to continue to stop.  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 ? ?Diarrhea, unspecified type ?-     DG Abd 1 View; Future ?- primarily in the morning, now s/p surgery some loose stools with urinating.  ??  Constipation with overflow diarrhea versus diarrhea, if Xray shows constipation will treat accordingly if negative will stop the senokot at night.  ?Can consider EPI.  ? ?Epigastric pain ?-     CBC with Differential/Platelet; Future ?-     pantoprazole (PROTONIX) 40 MG tablet; Take 1 tablet (40 mg total) by mouth 2 (two) times daily before a meal. ?Will do trial of PPI BID ?Consider gastroparesis study versus Levsin trial? Functional dyspepsia ? ?History of adenomatous polyp of colon ?Comments: ?05/27/2021 Colonoscopy, 10 adenomatous polyps repeat 1 year ? ?History of gastric bypass ?-     Iron and TIBC; Future ?-     Vitamin B12; Future ?Patient off B12 and iron, some glossitis noticed on exam. ? ?Iron deficiency anemia, unspecified iron deficiency anemia type ?-     Iron and TIBC; Future ?-Status post surgery had anemia, will get iron and B12. ? ? ?History of Present Illness:  ?71 y.o. female  with a past medical history of coronary artery disease, hyperlipidemia, remote smoking history, status post gastric bypass surgery, history of bladder cancer and others listed below, returns to clinic today for evaluation of abdominal  bloating. ? ?06/16/2021 seen in the office by Dr. Candis Garrett post prandial abdominal pain and sitophobia with weight loss, 2-3 bowel movements daily, loose poorly formed.  Imdium as needed.  Patient has history of chronic alcohol use.  [place] a day previously drank more.  Seeing a counselor to help with alcohol cessation. ? ?Doppler study of mesenteric vessels negative for chronic intestinal ischemia ?05/27/2021 EGD and colonoscopy ?EGD showed an enlarged gastric pouch with a hiatal hernia, but was otherwise unremarkable.  No marginal ulcer present.  The colonoscopy was notable for 10 adenomatous polyps and she was recommended to repeat in 1 year.   ?01/26/2021 CT abdomen without contrast due to allergy showed normal liver, status postcholecystectomy without biliary dilatation, normal pancreas, normal spleen normal adrenals, stomach and bowel suboptimally evaluated, but small hiatal hernia, no bowel wall thickening or bowel dilatation. ? 08/17/2021 MRCP abdomen with and without contrast for bladder cancer showed hepatic steatosis pancreas no mass no inflammatory changes no abnormality. ?Patient did have severe collapse of left femoral head with underlying marrow edema and enhancement. S/p left total hip 09/06/21 and states she is improving.  ? ?Last visit patient had slightly elevated LFTs presumed to alcohol but had abdominal ultrasound. She is on 2 glasses of wine a day which is less what she has done in the past.  ?Told to start on Metamucil and Imodium for diarrhea with urgency. ?Postprandial pain typically only 6-7 bites of food at a time weight was stable at that time. ? ?Weight continues to increase.   ?Iron was low at 22, Ferritin 91. 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  Could be contributing to alkaline phosphatase.  Also hepatic steatosis LFTs have decreased.  Patient had not had abdominal ultrasound. ? ?Patient has 2-3  diarrhea before food in the morning. When she is peeing can have diarrhea then as well since the surgery.  ?She has BM every day.  ?She states no matter what she eats, as soon as she finishes eating will have AB pain, nausea, no vomiting. Will feel better with lying down.  ?She is on senokot at night, some miralax as needed.  ?Will take imodium in the day.  ? ?CBC  09/10/2021  ?HGB 8.2 MCV 106.3 with macrocytic anemia ?WBC 9.2 Platelets 261 ?Anemia panel 09/10/2021  ?Iron 22 Ferritin 91  ?06/16/2021  B12 349 ?Kidney function 09/10/2021  ?BUN 23 Cr 0.68  ?GFR >60  ?Potassium 5.5   ?LFTs 09/10/2021  ?AST 16 ALT 9 ?Alkphos 158 TBili 0.1 ?01/26/2021 LIPASE 29 ? ? ?Wt Readings from Last 5 Encounters:  ?11/01/21 116 lb (52.6 kg)  ?09/05/21 109 lb (49.4 kg)  ?08/13/21 110 lb (49.9 kg)  ?08/06/21 110 lb 9.6 oz (50.2 kg)  ?06/30/21 105 lb 6.1 oz (47.8 kg)  ? ? ? ?Current Medications:  ? ? ?Current Outpatient Medications (Cardiovascular):  ?  ezetimibe-simvastatin (VYTORIN) 10-80 MG tablet, Take 1 tablet by mouth at bedtime. ?  hydrochlorothiazide (HYDRODIURIL) 25 MG tablet, Take 25 mg by mouth daily as needed (swelling/fluid). ?  carvedilol (COREG) 6.25 MG tablet, Take 1 tablet (6.25 mg total) by mouth 2 (two) times daily. ? ? ?Current Outpatient Medications (Analgesics):  ?  acetaminophen (TYLENOL) 500 MG tablet, Take 1,000 mg by mouth every 6 (six) hours as needed for mild pain. ?  aspirin 81 MG EC tablet, Take 1 tablet (81 mg total) by mouth every other day. Swallow whole.  Hold aspirin while taking eliquis, ?  NURTEC 75 MG TBDP, Take 75 mg by mouth daily as needed for headache. ? ?Current Outpatient Medications (Hematological):  ?  folic acid (FOLVITE) 1 MG tablet, Take 1 tablet (1 mg total) by mouth daily. ?  ferrous sulfate 325 (65 FE) MG EC tablet, Take 1 tablet (325 mg total) by mouth every Monday, Wednesday, and Friday. ? ?Current Outpatient Medications (Other):  ?  famotidine (PEPCID) 40 MG tablet, Take 40 mg by mouth daily  as needed for heartburn or indigestion. ?  lidocaine (XYLOCAINE) 5 % ointment, Apply 1 application topically as needed. (Patient taking differently: Apply 1 application. topically daily as needed (hip pain).) ?  pantoprazole (PROTONIX) 40 MG tablet, Take 1 tablet (40 mg total) by mouth 2 (two) times daily before a meal. ?  polyethylene glycol (MIRALAX / GLYCOLAX) 17 g packet, Take 17 g by mouth daily. (Patient taking differently: Take 17 g by mouth as needed.) ?  senna-docusate (SENOKOT-S) 8.6-50 MG tablet, Take 1 tablet by mouth at bedtime. ?  sertraline (ZOLOFT) 100 MG tablet, Take 100 mg by mouth daily. ?  tiZANidine (ZANAFLEX) 4 MG tablet, Take 4 mg by mouth at bedtime. ? ?Surgical History:  ?She  has a past surgical history that includes OTHER SURGICAL HISTORY; Gastric bypass; Cardiac catheterization; Breast surgery; Mastectomy; Endometrial ablation; DG  BONE DENSITY ([place] HX); Cholecystectomy (1979); Cataract extraction; and Total hip arthroplasty (Left, 09/06/2021). ?Family History:  ?Her family history includes Breast cancer in her sister; Congestive Heart Failure in her mother; Coronary artery disease in her father; Diabetes Mellitus II in her  sister; Heart attack (age of onset: 40) in her father; Hypertension in her sister. ?Social History:  ? reports that she has quit smoking. She has never been exposed to tobacco smoke. She has never used smokeless tobacco. She reports current alcohol use of about 14.0 standard drinks per week. She reports that she does not use drugs. ? ?Current Medications, Allergies, Past Medical History, Past Surgical History, Family History and Social History were reviewed in [employer] record. ? ?Physical Exam: ?BP (!) 162/72   Pulse 68   Ht _0  (1.422 m)   Wt 116 lb (52.6 kg)   SpO2 98%   BMI 26.01 kg/m?  ?General:   Pleasant, well developed female in no acute distress ?ENT: Slight depapillation of left tongue, smooth, slightly erythematous but no  obvious oral lesions ?Abdomen:  Soft, Obese AB, Sluggish bowel sounds. Mild epigastric. No organomegaly appreciated. ?Extremities:  with  edema. ?Neurologic:  Alert and  oriented x4;  No focal deficits. Walks with wa

## 2021-11-01 NOTE — Patient Instructions (Addendum)
Start on iron supplement once daily for at least a month and then recheck with B12 ? ?Please take your proton pump inhibitor medication 30 minutes to 1 hour before meals- this makes it more effective. DO THIS FOR 6-8 WEEKS, IF NOT BETTER WILL CONSIDER OTHER OPTIONS.  ?Avoid spicy and acidic foods ?Avoid fatty foods ?Limit your intake of coffee, tea, alcohol, and carbonated drinks ?Work to maintain a healthy weight ?Keep the head of the bed elevated at least 3 inches with blocks or a wedge pillow if you are having any nighttime symptoms ?Stay upright for 2 hours after eating ?Avoid meals and snacks three to four hours before bedtime ? ? ?Gastroesophageal Reflux Disease, Adult ?Gastroesophageal reflux (GER) happens when acid from the stomach flows up into the tube that connects the mouth and the stomach (esophagus). Normally, food travels down the esophagus and stays in the stomach to be digested. However, when a person has GER, food and stomach acid sometimes move back up into the esophagus. If this becomes a more serious problem, the person may be diagnosed with a disease called gastroesophageal reflux disease (GERD). GERD occurs when the reflux: ?Happens often. ?Causes frequent or severe symptoms. ?Causes problems such as damage to the esophagus. ?When stomach acid comes in contact with the esophagus, the acid may cause inflammation in the esophagus. Over time, GERD may create small holes (ulcers) in the lining of the esophagus. ?What are the causes? ?This condition is caused by a problem with the muscle between the esophagus and the stomach (lower esophageal sphincter, or LES). Normally, the LES muscle closes after food passes through the esophagus to the stomach. When the LES is weakened or abnormal, it does not close properly, and that allows food and stomach acid to go back up into the esophagus. ?The LES can be weakened by certain dietary substances, medicines, and medical conditions, including: ?Tobacco  use. ?Pregnancy. ?Having a hiatal hernia. ?Alcohol use. ?Certain foods and beverages, such as coffee, chocolate, onions, and peppermint. ?What increases the risk? ?You are more likely to develop this condition if you: ?Have an increased body weight. ?Have a connective tissue disorder. ?Take NSAIDs, such as ibuprofen. ?What are the signs or symptoms? ?Symptoms of this condition include: ?Heartburn. ?Difficult or painful swallowing and the feeling of having a lump in the throat. ?A bitter taste in the mouth. ?Bad breath and having a large amount of saliva. ?Having an upset or bloated stomach and belching. ?Chest pain. Different conditions can cause chest pain. Make sure you see your health care provider if you experience chest pain. ?Shortness of breath or wheezing. ?Ongoing (chronic) cough or a nighttime cough. ?Wearing away of tooth enamel. ?Weight loss. ?How is this diagnosed? ?This condition may be diagnosed based on a medical history and a physical exam. To determine if you have mild or severe GERD, your health care provider may also monitor how you respond to treatment. You may also have tests, including: ?A test to examine your stomach and esophagus with a small camera (endoscopy). ?A test that measures the acidity level in your esophagus. ?A test that measures how much pressure is on your esophagus. ?A barium swallow or modified barium swallow test to show the shape, size, and functioning of your esophagus. ?How is this treated? ?Treatment for this condition may vary depending on how severe your symptoms are. Your health care provider may recommend: ?Changes to your diet. ?Medicine. ?Surgery. ?The goal of treatment is to help relieve your symptoms and to  prevent complications. ?Follow these instructions at home: ?Eating and drinking ? ?Follow a diet as recommended by your health care provider. This may involve avoiding foods and drinks such as: ?Coffee and tea, with or without caffeine. ?Drinks that contain  alcohol. ?Energy drinks and sports drinks. ?Carbonated drinks or sodas. ?Chocolate and cocoa. ?Peppermint and mint flavorings. ?Garlic and onions. ?Horseradish. ?Spicy and acidic foods, including peppers, chili powder, curry powder, vinegar, hot sauces, and barbecue sauce. ?Citrus fruit juices and citrus fruits, such as oranges, lemons, and limes. ?Tomato-based foods, such as red sauce, chili, salsa, and pizza with red sauce. ?Fried and fatty foods, such as donuts, french fries, potato chips, and high-fat dressings. ?High-fat meats, such as hot dogs and fatty cuts of red and white meats, such as rib eye steak, sausage, ham, and bacon. ?High-fat dairy items, such as whole milk, butter, and cream cheese. ?Eat small, frequent meals instead of large meals. ?Avoid drinking large amounts of liquid with your meals. ?Avoid eating meals during the 2-3 hours before bedtime. ?Avoid lying down right after you eat. ?Do not exercise right after you eat. ?Lifestyle ? ?Do not use any products that contain nicotine or tobacco. These products include cigarettes, chewing tobacco, and vaping devices, such as e-cigarettes. If you need help quitting, ask your health care provider. ?Try to reduce your stress by using methods such as yoga or meditation. If you need help reducing stress, ask your health care provider. ?If you are overweight, reduce your weight to an amount that is healthy for you. Ask your health care provider for guidance about a safe weight loss goal. ?General instructions ?Pay attention to any changes in your symptoms. ?Take over-the-counter and prescription medicines only as told by your health care provider. Do not take aspirin, ibuprofen, or other NSAIDs unless your health care provider told you to take these medicines. ?Wear loose-fitting clothing. Do not wear anything tight around your waist that causes pressure on your abdomen. ?Raise (elevate) the head of your bed about 6 inches (15 cm). You can use a wedge to do  this. ?Avoid bending over if this makes your symptoms worse. ?Keep all follow-up visits. This is important. ?Contact a health care provider if: ?You have: ?New symptoms. ?Unexplained weight loss. ?Difficulty swallowing or it hurts to swallow. ?Wheezing or a persistent cough. ?A hoarse voice. ?Your symptoms do not improve with treatment. ?Get help right away if: ?You have sudden pain in your arms, neck, jaw, teeth, or back. ?You suddenly feel sweaty, dizzy, or light-headed. ?You have chest pain or shortness of breath. ?You vomit and the vomit is green, yellow, or black, or it looks like blood or coffee grounds. ?You faint. ?You have stool that is red, bloody, or black. ?You cannot swallow, drink, or eat. ?These symptoms may represent a serious problem that is an emergency. Do not wait to see if the symptoms will go away. Get medical help right away. Call your local emergency services (911 in the [place] Do not drive yourself to the hospital. ?Summary ?Gastroesophageal reflux happens when acid from the stomach flows up into the esophagus. GERD is a disease in which the reflux happens often, causes frequent or severe symptoms, or causes problems such as damage to the esophagus. ?Treatment for this condition may vary depending on how severe your symptoms are. Your health care provider may recommend diet and lifestyle changes, medicine, or surgery. ?Contact a health care provider if you have new or worsening symptoms. ?Take over-the-counter and prescription medicines only  as told by your health care provider. Do not take aspirin, ibuprofen, or other NSAIDs unless your health care provider told you to do so. ?Keep all follow-up visits as told by your health care provider. This is important. ?This information is not intended to replace advice given to you by your health care provider. Make sure you discuss any questions you have with your health care provider. ?Document Revised: 01/06/2020 Document Reviewed:  01/06/2020 ?Elsevier Patient Education ? 2022 [place]. ? ? ?

## 2021-11-02 NOTE — Progress Notes (Signed)
Agree with the assessment and plan as outlined by Amanda Collier,  PA-C.  Dayja Loveridge E. Cari Vandeberg, MD  

## 2021-11-03 LAB — IRON AND TIBC
Iron Saturation: 40 % (ref 15–55)
Iron: 80 ug/dL (ref 27–139)
Total Iron Binding Capacity: 200 ug/dL — ABNORMAL LOW (ref 250–450)
UIBC: 120 ug/dL (ref 118–369)

## 2021-11-16 DIAGNOSIS — F331 Major depressive disorder, recurrent, moderate: Secondary | ICD-10-CM | POA: Diagnosis not present

## 2021-11-28 ENCOUNTER — Inpatient Hospital Stay (HOSPITAL_BASED_OUTPATIENT_CLINIC_OR_DEPARTMENT_OTHER)
Admission: EM | Admit: 2021-11-28 | Discharge: 2021-12-03 | DRG: 690 | Disposition: A | Discharge status: Skilled Nursing Facility | Payer: Medicare PPO | Attending: Internal Medicine | Admitting: Internal Medicine

## 2021-11-28 ENCOUNTER — Encounter (HOSPITAL_BASED_OUTPATIENT_CLINIC_OR_DEPARTMENT_OTHER): Payer: Self-pay | Admitting: Emergency Medicine

## 2021-11-28 ENCOUNTER — Other Ambulatory Visit: Payer: Self-pay

## 2021-11-28 ENCOUNTER — Emergency Department (HOSPITAL_BASED_OUTPATIENT_CLINIC_OR_DEPARTMENT_OTHER): Payer: Medicare PPO

## 2021-11-28 ENCOUNTER — Emergency Department (HOSPITAL_BASED_OUTPATIENT_CLINIC_OR_DEPARTMENT_OTHER): Payer: Medicare PPO | Admitting: Radiology

## 2021-11-28 DIAGNOSIS — Z8551 Personal history of malignant neoplasm of bladder: Secondary | ICD-10-CM

## 2021-11-28 DIAGNOSIS — Z853 Personal history of malignant neoplasm of breast: Secondary | ICD-10-CM

## 2021-11-28 DIAGNOSIS — R627 Adult failure to thrive: Secondary | ICD-10-CM | POA: Diagnosis not present

## 2021-11-28 DIAGNOSIS — N3001 Acute cystitis with hematuria: Principal | ICD-10-CM

## 2021-11-28 DIAGNOSIS — I110 Hypertensive heart disease with heart failure: Secondary | ICD-10-CM | POA: Diagnosis present

## 2021-11-28 DIAGNOSIS — I5032 Chronic diastolic (congestive) heart failure: Secondary | ICD-10-CM | POA: Diagnosis not present

## 2021-11-28 DIAGNOSIS — G4733 Obstructive sleep apnea (adult) (pediatric): Secondary | ICD-10-CM | POA: Diagnosis present

## 2021-11-28 DIAGNOSIS — W010XXA Fall on same level from slipping, tripping and stumbling without subsequent striking against object, initial encounter: Secondary | ICD-10-CM | POA: Diagnosis present

## 2021-11-28 DIAGNOSIS — Z7401 Bed confinement status: Secondary | ICD-10-CM | POA: Diagnosis not present

## 2021-11-28 DIAGNOSIS — N390 Urinary tract infection, site not specified: Principal | ICD-10-CM | POA: Diagnosis present

## 2021-11-28 DIAGNOSIS — Y92019 Unspecified place in single-family (private) house as the place of occurrence of the external cause: Secondary | ICD-10-CM

## 2021-11-28 DIAGNOSIS — W19XXXA Unspecified fall, initial encounter: Secondary | ICD-10-CM | POA: Diagnosis not present

## 2021-11-28 DIAGNOSIS — R9431 Abnormal electrocardiogram [ECG] [EKG]: Secondary | ICD-10-CM | POA: Diagnosis not present

## 2021-11-28 DIAGNOSIS — R41841 Cognitive communication deficit: Secondary | ICD-10-CM | POA: Diagnosis not present

## 2021-11-28 DIAGNOSIS — K5900 Constipation, unspecified: Secondary | ICD-10-CM | POA: Diagnosis present

## 2021-11-28 DIAGNOSIS — E872 Acidosis, unspecified: Secondary | ICD-10-CM | POA: Diagnosis present

## 2021-11-28 DIAGNOSIS — J9601 Acute respiratory failure with hypoxia: Secondary | ICD-10-CM | POA: Diagnosis not present

## 2021-11-28 DIAGNOSIS — D508 Other iron deficiency anemias: Secondary | ICD-10-CM | POA: Diagnosis not present

## 2021-11-28 DIAGNOSIS — J9811 Atelectasis: Secondary | ICD-10-CM | POA: Diagnosis not present

## 2021-11-28 DIAGNOSIS — R000 Tachycardia, unspecified: Secondary | ICD-10-CM | POA: Diagnosis not present

## 2021-11-28 DIAGNOSIS — R0902 Hypoxemia: Secondary | ICD-10-CM | POA: Diagnosis not present

## 2021-11-28 DIAGNOSIS — N3000 Acute cystitis without hematuria: Secondary | ICD-10-CM

## 2021-11-28 DIAGNOSIS — Z803 Family history of malignant neoplasm of breast: Secondary | ICD-10-CM

## 2021-11-28 DIAGNOSIS — Z833 Family history of diabetes mellitus: Secondary | ICD-10-CM | POA: Diagnosis not present

## 2021-11-28 DIAGNOSIS — Z955 Presence of coronary angioplasty implant and graft: Secondary | ICD-10-CM

## 2021-11-28 DIAGNOSIS — B9620 Unspecified Escherichia coli [E. coli] as the cause of diseases classified elsewhere: Secondary | ICD-10-CM | POA: Diagnosis not present

## 2021-11-28 DIAGNOSIS — E611 Iron deficiency: Secondary | ICD-10-CM | POA: Diagnosis present

## 2021-11-28 DIAGNOSIS — Z882 Allergy status to sulfonamides status: Secondary | ICD-10-CM

## 2021-11-28 DIAGNOSIS — S51012A Laceration without foreign body of left elbow, initial encounter: Secondary | ICD-10-CM | POA: Diagnosis present

## 2021-11-28 DIAGNOSIS — R319 Hematuria, unspecified: Secondary | ICD-10-CM | POA: Diagnosis not present

## 2021-11-28 DIAGNOSIS — Z7982 Long term (current) use of aspirin: Secondary | ICD-10-CM

## 2021-11-28 DIAGNOSIS — Z9884 Bariatric surgery status: Secondary | ICD-10-CM

## 2021-11-28 DIAGNOSIS — D519 Vitamin B12 deficiency anemia, unspecified: Secondary | ICD-10-CM | POA: Diagnosis not present

## 2021-11-28 DIAGNOSIS — S0990XA Unspecified injury of head, initial encounter: Secondary | ICD-10-CM | POA: Diagnosis not present

## 2021-11-28 DIAGNOSIS — Z96642 Presence of left artificial hip joint: Secondary | ICD-10-CM | POA: Diagnosis present

## 2021-11-28 DIAGNOSIS — Z8249 Family history of ischemic heart disease and other diseases of the circulatory system: Secondary | ICD-10-CM

## 2021-11-28 DIAGNOSIS — Z9049 Acquired absence of other specified parts of digestive tract: Secondary | ICD-10-CM

## 2021-11-28 DIAGNOSIS — D7589 Other specified diseases of blood and blood-forming organs: Secondary | ICD-10-CM | POA: Diagnosis present

## 2021-11-28 DIAGNOSIS — Z043 Encounter for examination and observation following other accident: Secondary | ICD-10-CM | POA: Diagnosis not present

## 2021-11-28 DIAGNOSIS — I10 Essential (primary) hypertension: Secondary | ICD-10-CM | POA: Diagnosis present

## 2021-11-28 DIAGNOSIS — M85862 Other specified disorders of bone density and structure, left lower leg: Secondary | ICD-10-CM | POA: Diagnosis present

## 2021-11-28 DIAGNOSIS — Z79899 Other long term (current) drug therapy: Secondary | ICD-10-CM

## 2021-11-28 DIAGNOSIS — Z87891 Personal history of nicotine dependence: Secondary | ICD-10-CM | POA: Diagnosis not present

## 2021-11-28 DIAGNOSIS — Z881 Allergy status to other antibiotic agents status: Secondary | ICD-10-CM

## 2021-11-28 DIAGNOSIS — D649 Anemia, unspecified: Secondary | ICD-10-CM | POA: Diagnosis not present

## 2021-11-28 DIAGNOSIS — M25562 Pain in left knee: Secondary | ICD-10-CM | POA: Diagnosis present

## 2021-11-28 DIAGNOSIS — E785 Hyperlipidemia, unspecified: Secondary | ICD-10-CM | POA: Diagnosis present

## 2021-11-28 DIAGNOSIS — S199XXA Unspecified injury of neck, initial encounter: Secondary | ICD-10-CM | POA: Diagnosis not present

## 2021-11-28 DIAGNOSIS — Y9301 Activity, walking, marching and hiking: Secondary | ICD-10-CM | POA: Diagnosis present

## 2021-11-28 DIAGNOSIS — I2510 Atherosclerotic heart disease of native coronary artery without angina pectoris: Secondary | ICD-10-CM | POA: Diagnosis not present

## 2021-11-28 DIAGNOSIS — R609 Edema, unspecified: Secondary | ICD-10-CM | POA: Diagnosis not present

## 2021-11-28 DIAGNOSIS — I252 Old myocardial infarction: Secondary | ICD-10-CM

## 2021-11-28 DIAGNOSIS — M1712 Unilateral primary osteoarthritis, left knee: Secondary | ICD-10-CM | POA: Diagnosis not present

## 2021-11-28 DIAGNOSIS — R2689 Other abnormalities of gait and mobility: Secondary | ICD-10-CM | POA: Diagnosis not present

## 2021-11-28 DIAGNOSIS — I519 Heart disease, unspecified: Secondary | ICD-10-CM | POA: Diagnosis not present

## 2021-11-28 DIAGNOSIS — Z6825 Body mass index (BMI) 25.0-25.9, adult: Secondary | ICD-10-CM

## 2021-11-28 DIAGNOSIS — M6281 Muscle weakness (generalized): Secondary | ICD-10-CM | POA: Diagnosis not present

## 2021-11-28 LAB — LACTIC ACID, PLASMA
Lactic Acid, Venous: 2.3 mmol/L (ref 0.5–1.9)
Lactic Acid, Venous: 2 mmol/L (ref 0.5–1.9)
Lactic Acid, Venous: 4.3 mmol/L (ref 0.5–1.9)

## 2021-11-28 LAB — URINALYSIS, ROUTINE W REFLEX MICROSCOPIC
Bilirubin Urine: NEGATIVE
Glucose, UA: NEGATIVE mg/dL
Nitrite: POSITIVE — AB
Protein, ur: 100 mg/dL — AB
RBC / HPF: >50 RBC/hpf — ABNORMAL HIGH (ref 0–5)
Specific Gravity, Urine: 1.028 (ref 1.005–1.030)
WBC, UA: >50 WBC/hpf — ABNORMAL HIGH (ref 0–5)
pH: 5.5 (ref 5.0–8.0)

## 2021-11-28 LAB — BASIC METABOLIC PANEL
Anion gap: 12 (ref 5–15)
BUN: 12 mg/dL (ref 8–23)
CO2: 21 mmol/L — ABNORMAL LOW (ref 22–32)
Calcium: 8.8 mg/dL — ABNORMAL LOW (ref 8.9–10.3)
Chloride: 107 mmol/L (ref 98–111)
Creatinine, Ser: 0.75 mg/dL (ref 0.44–1.00)
GFR, Estimated: >60 mL/min (ref >60)
Glucose, Bld: 149 mg/dL — ABNORMAL HIGH (ref 70–99)
Potassium: 4.8 mmol/L (ref 3.5–5.1)
Sodium: 140 mmol/L (ref 135–145)

## 2021-11-28 LAB — CBC WITH DIFFERENTIAL/PLATELET
Abs Immature Granulocytes: 0.09 10*3/uL — ABNORMAL HIGH (ref 0.00–0.07)
Basophils Absolute: 0.1 10*3/uL (ref 0.0–0.1)
Basophils Relative: 1 %
Eosinophils Absolute: 0.1 10*3/uL (ref 0.0–0.5)
Eosinophils Relative: 0 %
HCT: 37.5 % (ref 36.0–46.0)
Hemoglobin: 12.1 g/dL (ref 12.0–15.0)
Immature Granulocytes: 1 %
Lymphocytes Relative: 18 %
Lymphs Abs: 2.7 10*3/uL (ref 0.7–4.0)
MCH: 31.6 pg (ref 26.0–34.0)
MCHC: 32.3 g/dL (ref 30.0–36.0)
MCV: 97.9 fL (ref 80.0–100.0)
Monocytes Absolute: 1.3 10*3/uL — ABNORMAL HIGH (ref 0.1–1.0)
Monocytes Relative: 9 %
Neutro Abs: 11.2 10*3/uL — ABNORMAL HIGH (ref 1.7–7.7)
Neutrophils Relative %: 71 %
Platelets: 258 10*3/uL (ref 150–400)
RBC: 3.83 MIL/uL — ABNORMAL LOW (ref 3.87–5.11)
RDW: 14.4 % (ref 11.5–15.5)
WBC: 15.5 10*3/uL — ABNORMAL HIGH (ref 4.0–10.5)
nRBC: 0 % (ref 0.0–0.2)

## 2021-11-28 LAB — MAGNESIUM: Magnesium: 2 mg/dL (ref 1.7–2.4)

## 2021-11-28 IMAGING — DX DG TIBIA/FIBULA 2V*L*
2 series · 2 of 2 positions shown · non-contrast
Comparison: [DATE]

CLINICAL DATA: Recent fall, knee pain

EXAM:
LEFT TIBIA AND FIBULA - 2 VIEW

[tibia ap]
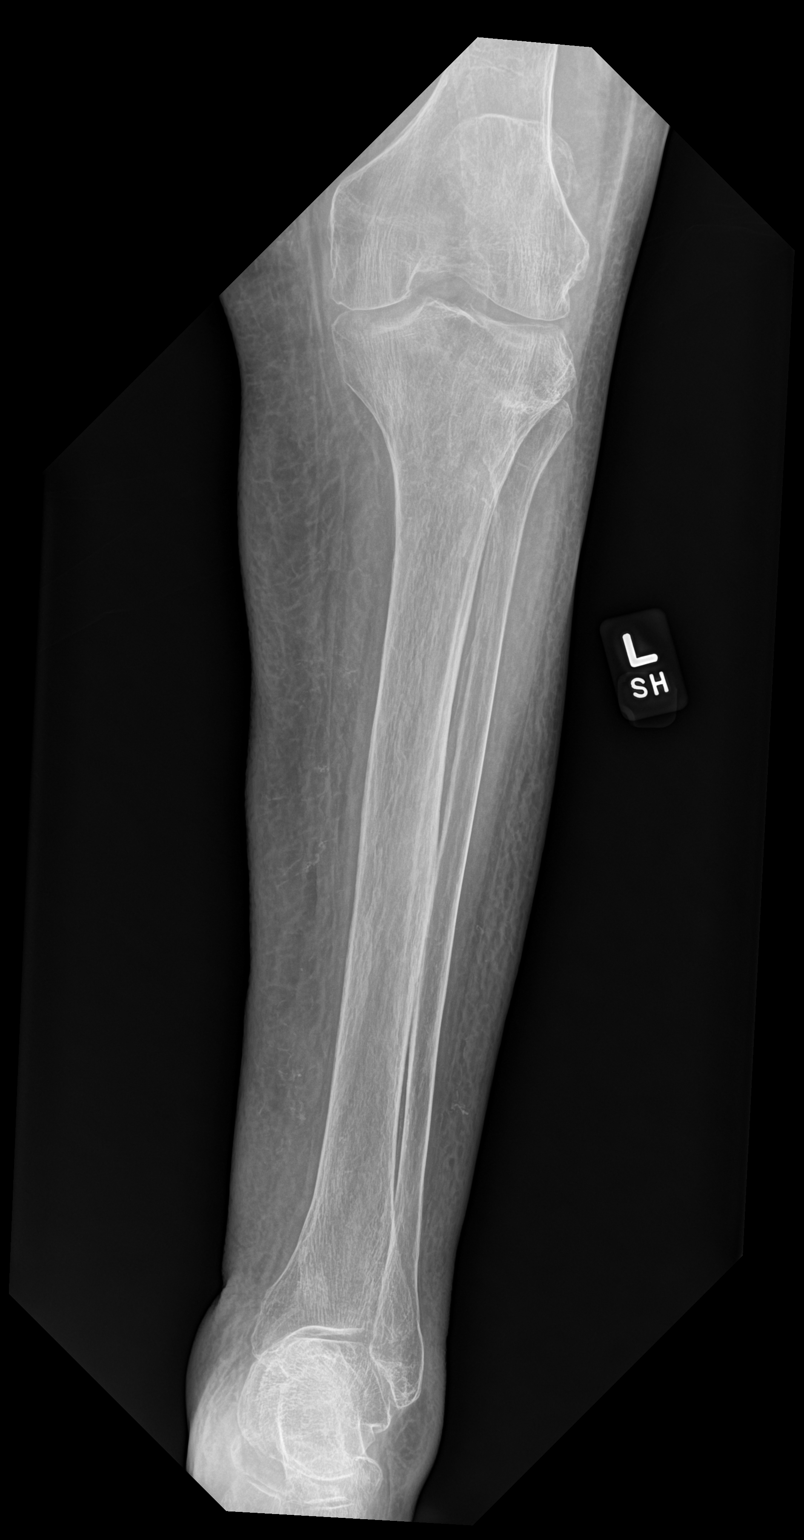

[tibia lat]
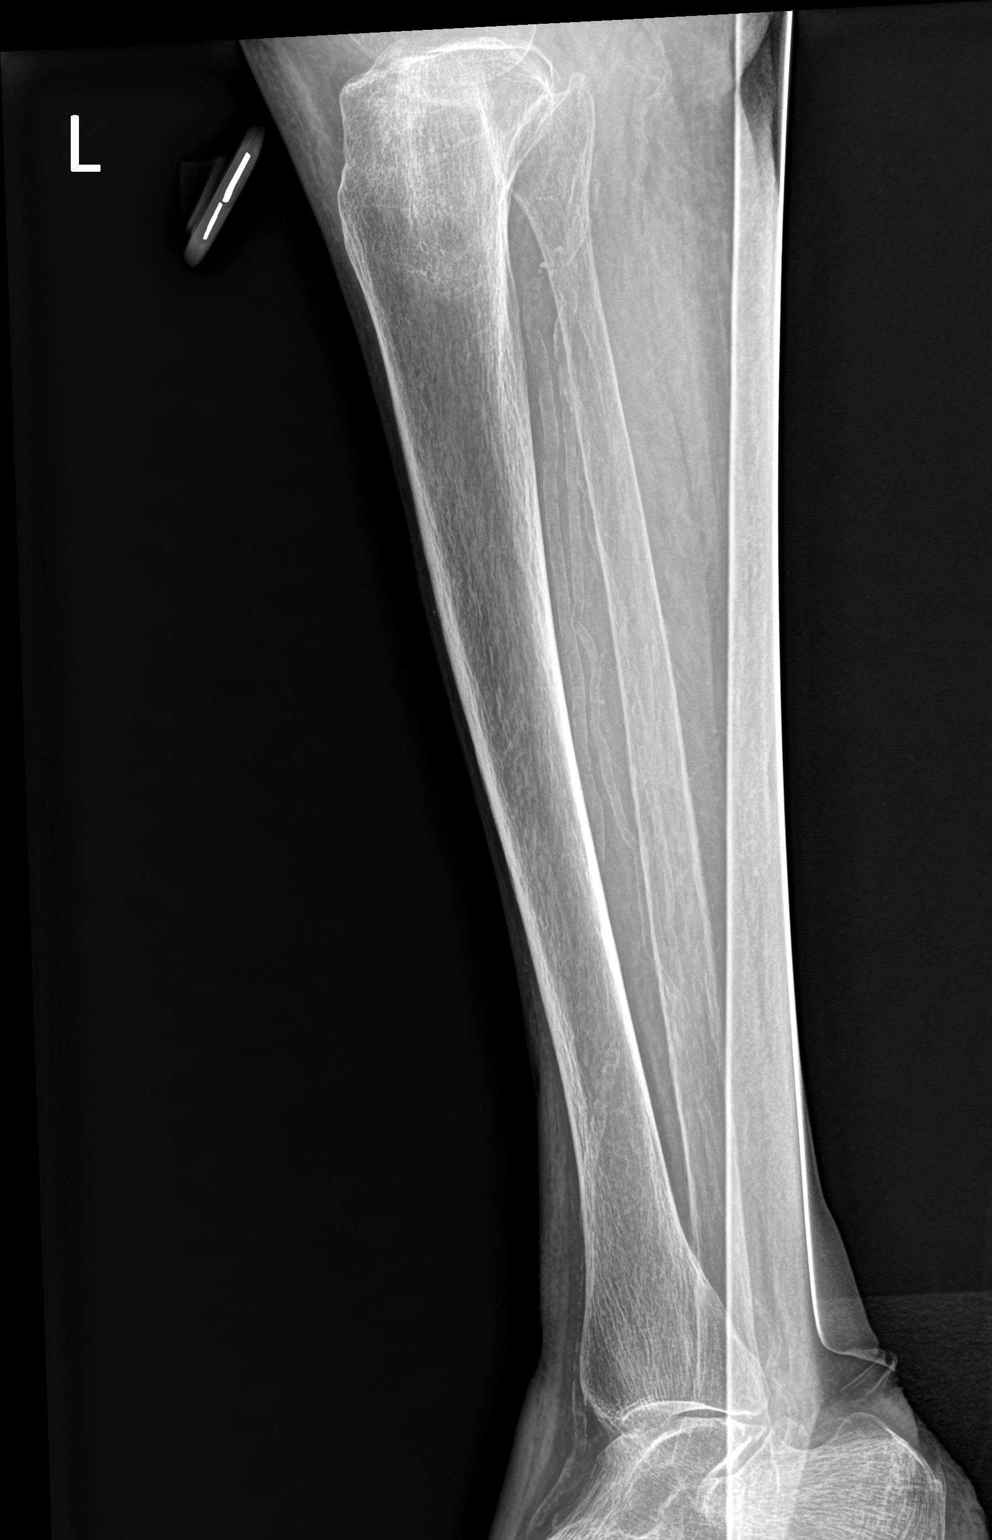

[2 of 2 positions shown; findings below may reference images not displayed]

FINDINGS: Bones are osteopenic. No acute osseous finding, displaced fracture
or malalignment. Peripheral atherosclerosis noted.
IMPRESSION: Osteopenia.  No acute finding by plain radiography

Peripheral atherosclerosis

## 2021-11-28 IMAGING — CT CT CERVICAL SPINE W/O CM
3 of 4 series · 11 of 33 positions shown, 13 images · non-contrast
Comparison: MRI [DATE]

CLINICAL DATA: Head trauma, minor (Age >= 65y); Neck trauma (Age >=
65y)



[Series 5: cor bone · coronal · 0.32mm/px · 3 of 60 slices shown]
[im 18/60  bone]
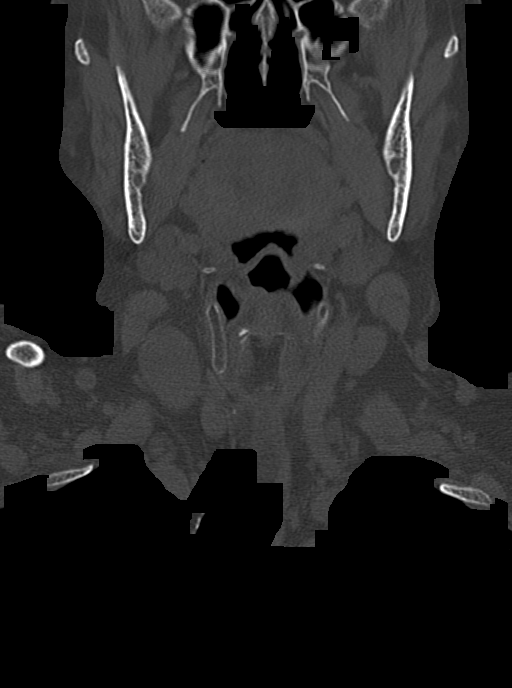
[im 26/60  bone]
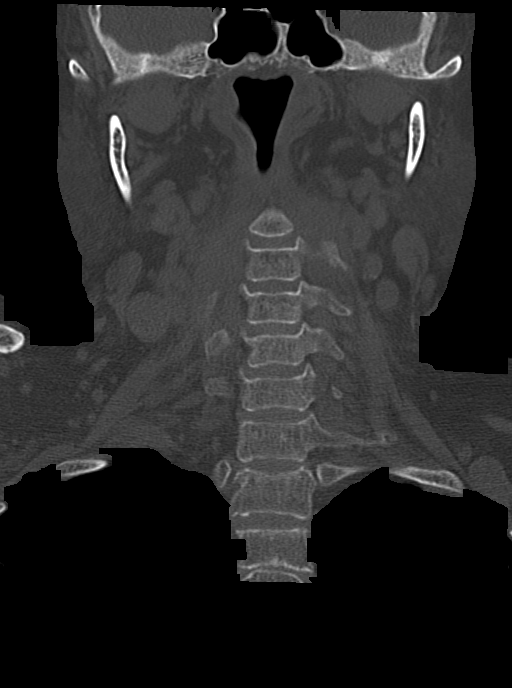
[im 34/60  bone]
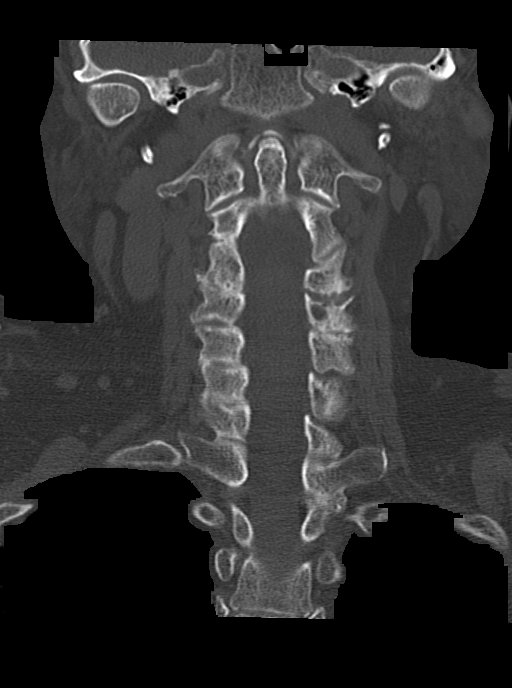

[Series 6: sag bone · sagittal · 0.23mm/px · 5 of 68 slices shown, 6 images]
[im 23/68  bone]
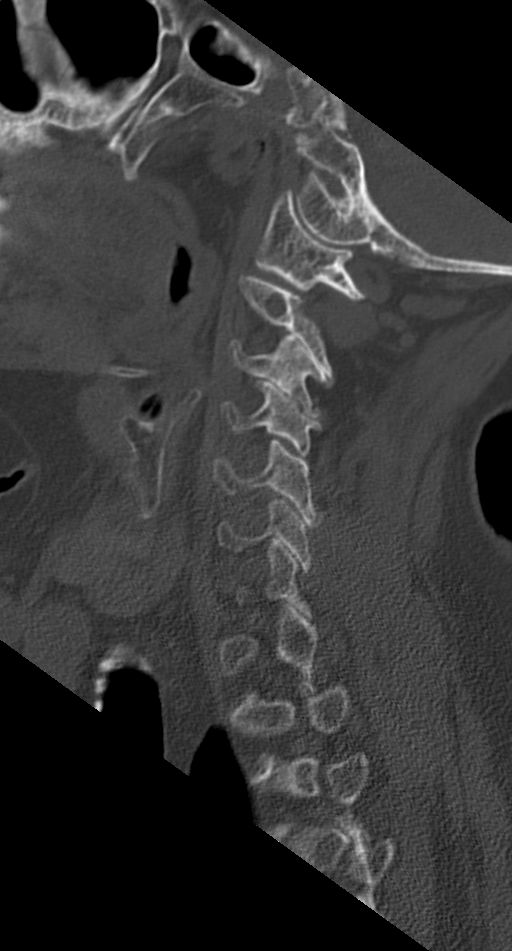
[im 28/68  bone]
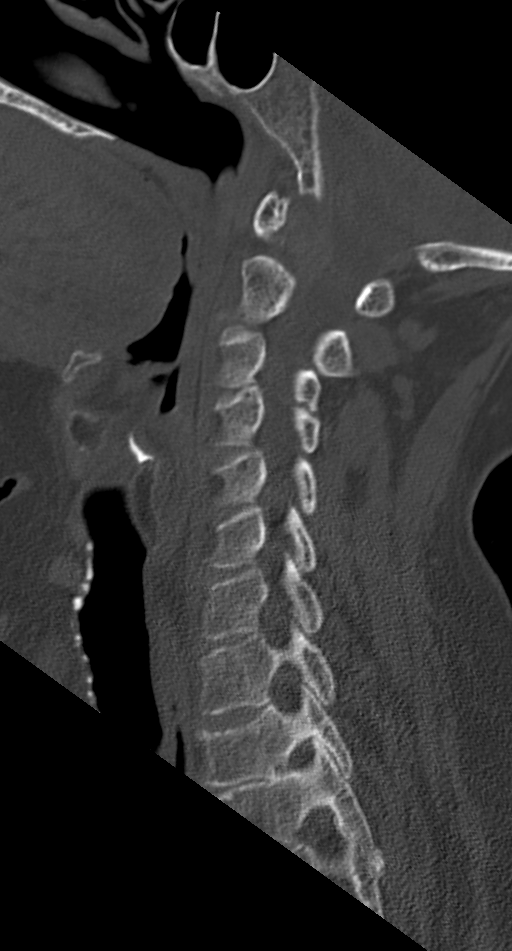
[im 34/68  soft-tissue]
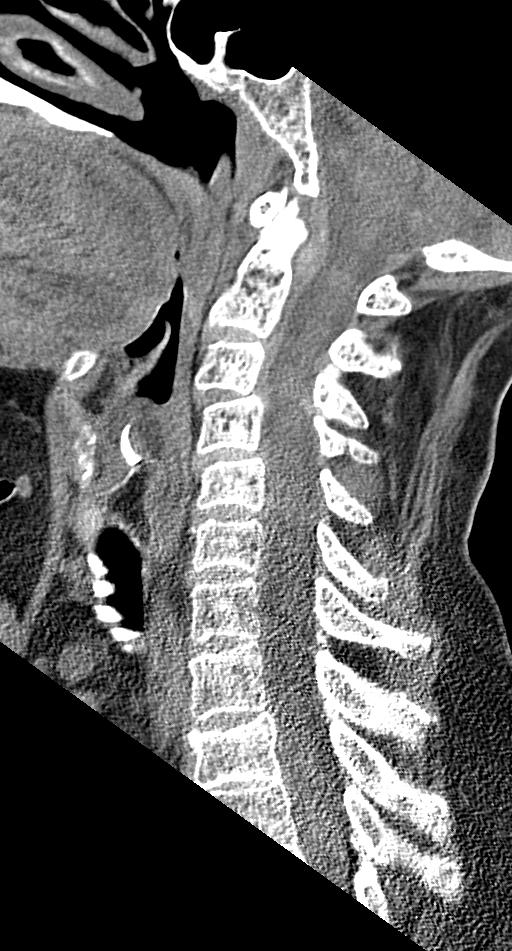
[im 34/68  bone]
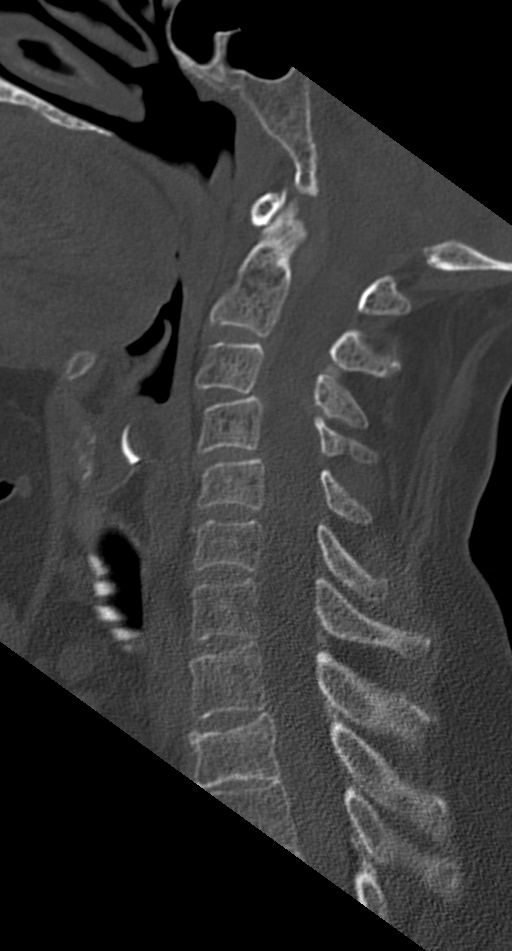
[im 40/68  bone]
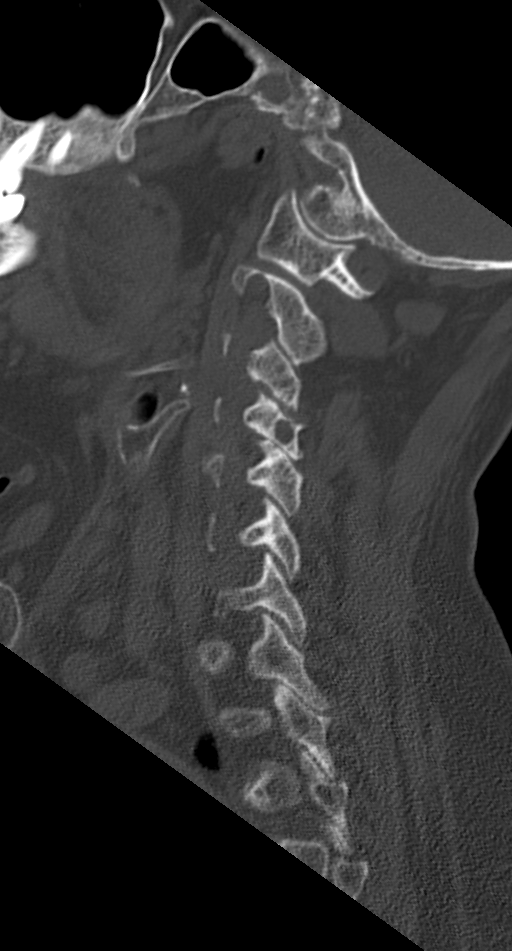
[im 45/68  bone]
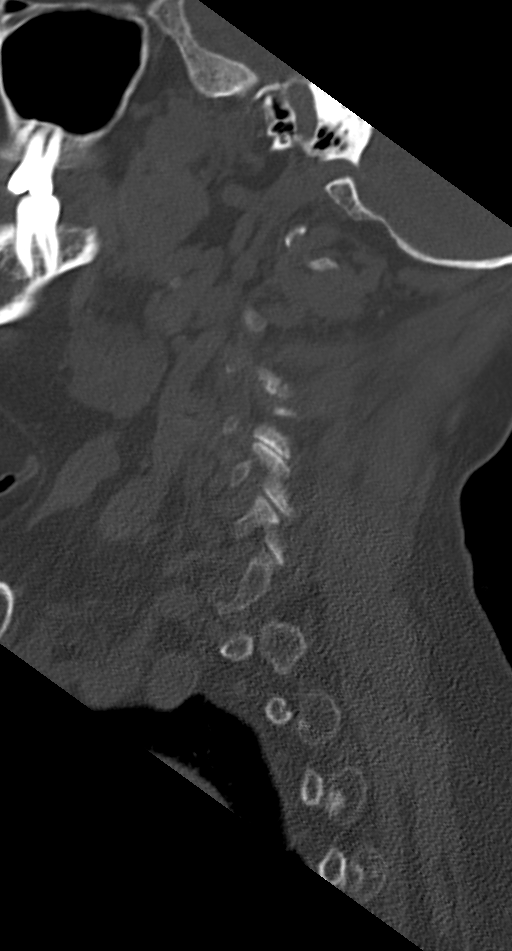

[Series 7: orthogonal axials · axial · 0.21mm/px · z∈[-307,-202]mm · 3 of 83 slices shown, 4 images]
[im 14/83  soft-tissue]
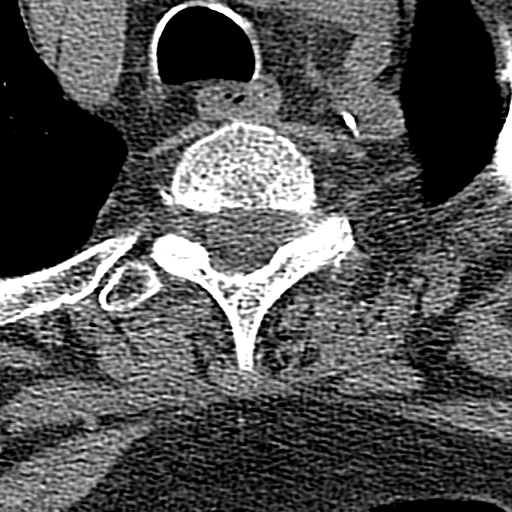
[im 14/83  bone]
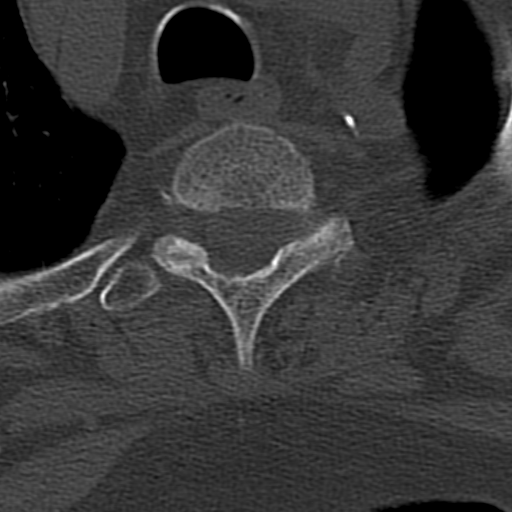
[im 42/83  bone]
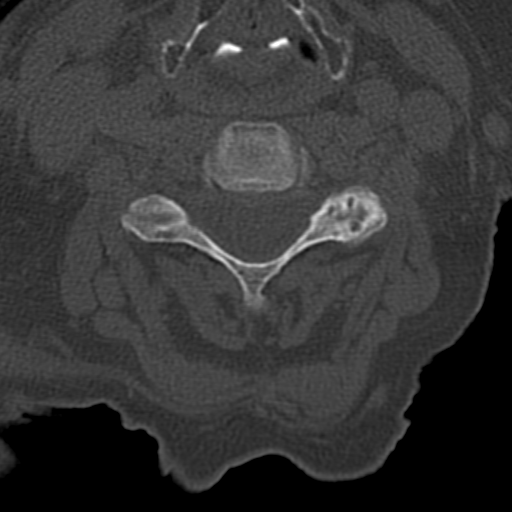
[im 69/83  bone]
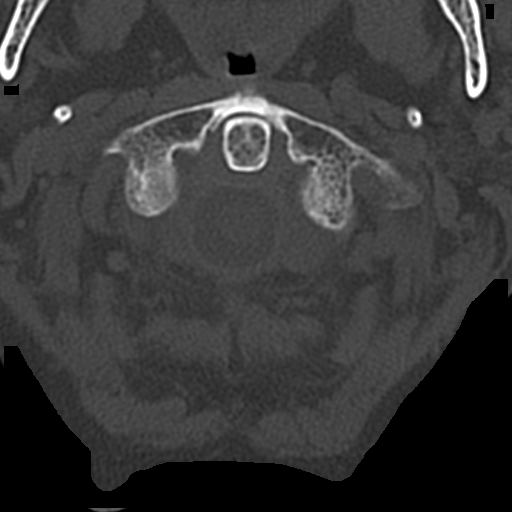

[11 of 33 positions shown; findings below may reference images not displayed]

FINDINGS: CT HEAD FINDINGS

Brain: No evidence of acute infarction, hemorrhage, hydrocephalus,
extra-axial collection or mass lesion/mass effect. Patchy
low-density changes within the periventricular and subcortical white
matter compatible with chronic microvascular ischemic change.
Mild-to-moderate diffuse cerebral volume loss.

Vascular: No hyperdense vessel or unexpected calcification.

Skull: Normal. Negative for fracture or focal lesion.

Sinuses/Orbits: No acute finding.

Other: None.

CT CERVICAL SPINE FINDINGS

Alignment: Facet joints are aligned without dislocation or traumatic
listhesis. Dens and lateral masses are aligned. Mild grade 1
anterolisthesis of C3 on C4 and C4 on C5 mediated by degenerative
facet arthropathy.

Skull base and vertebrae: Mild superior endplate compression
deformity of T2 with mild sclerosis at the superior endplate
suggesting a subacute or chronic process. No evidence of an acute
fracture of the cervical spine. No suspicious lytic or sclerotic
bone lesion.

Soft tissues and spinal canal: No prevertebral fluid or swelling. No
visible canal hematoma.

Disc levels: Intervertebral disc heights are preserved. Degenerative
facet arthropathy within the upper cervical spine.

Upper chest: Negative.

Other: None.
IMPRESSION: 1. No acute intracranial abnormality.
2. No acute cervical spine fracture.
3. Mild superior endplate compression deformity of T2 with mild
sclerosis at the superior endplate suggesting a subacute or chronic
process. Correlate with point tenderness.
4. Chronic microvascular ischemic change and cerebral volume loss.

## 2021-11-28 IMAGING — DX DG KNEE COMPLETE 4+V*L*
4 series · 4 of 4 positions shown · non-contrast
Comparison: None Available.

CLINICAL DATA: Fall.

EXAM:
LEFT KNEE - COMPLETE 4+ VIEW

[knee ap]
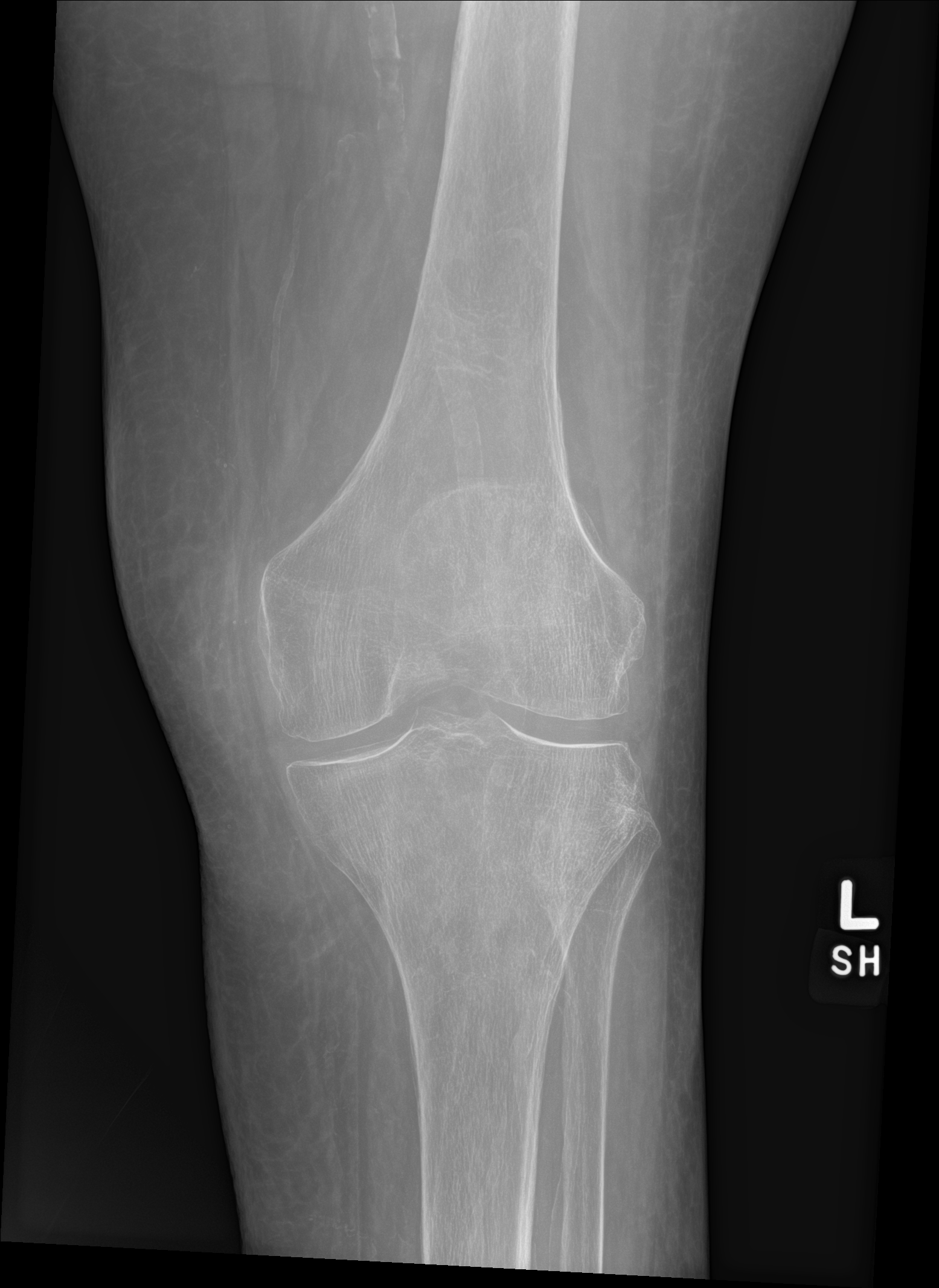

[knee lat]
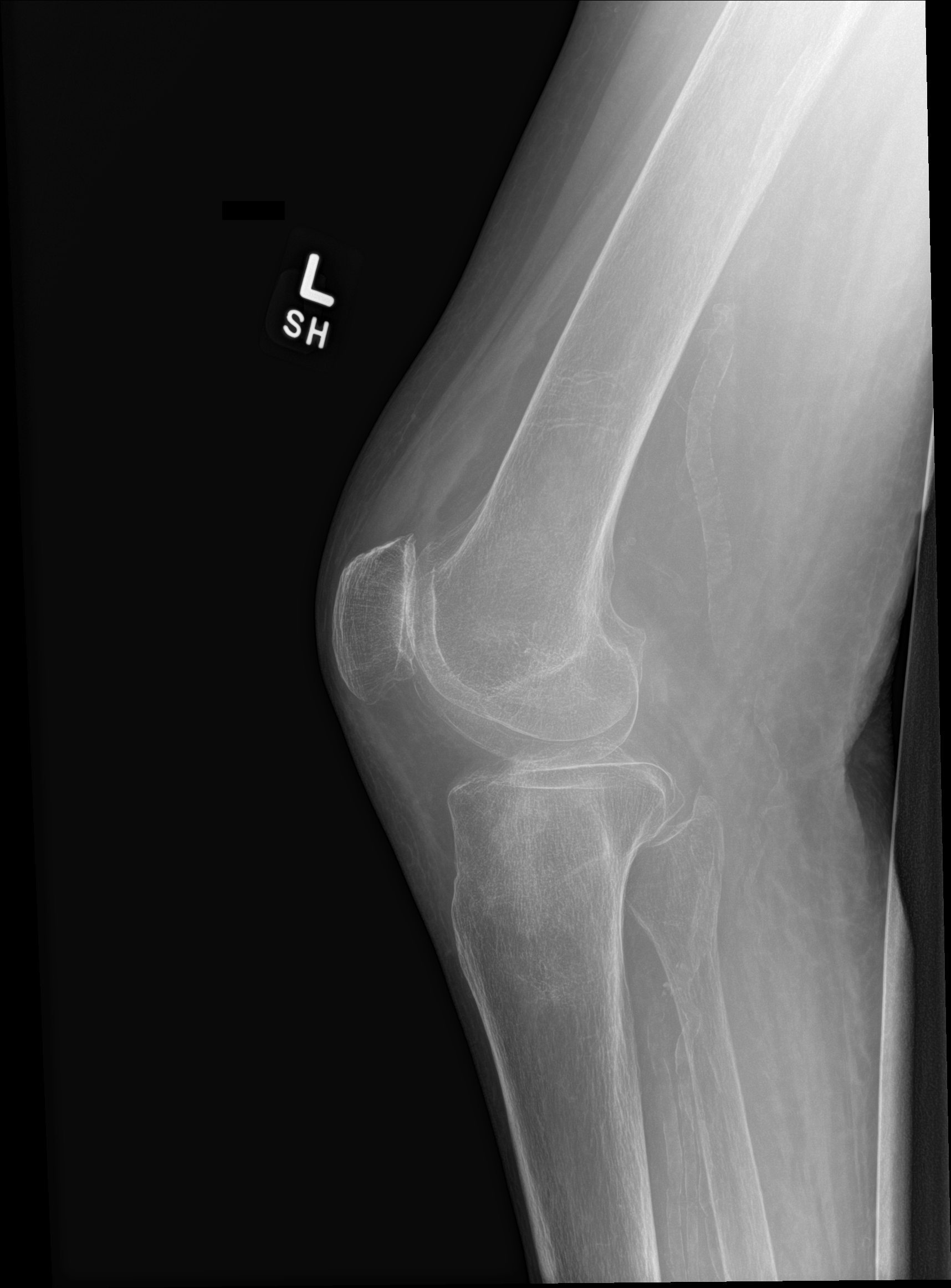

[knee obl (1 of 2)]
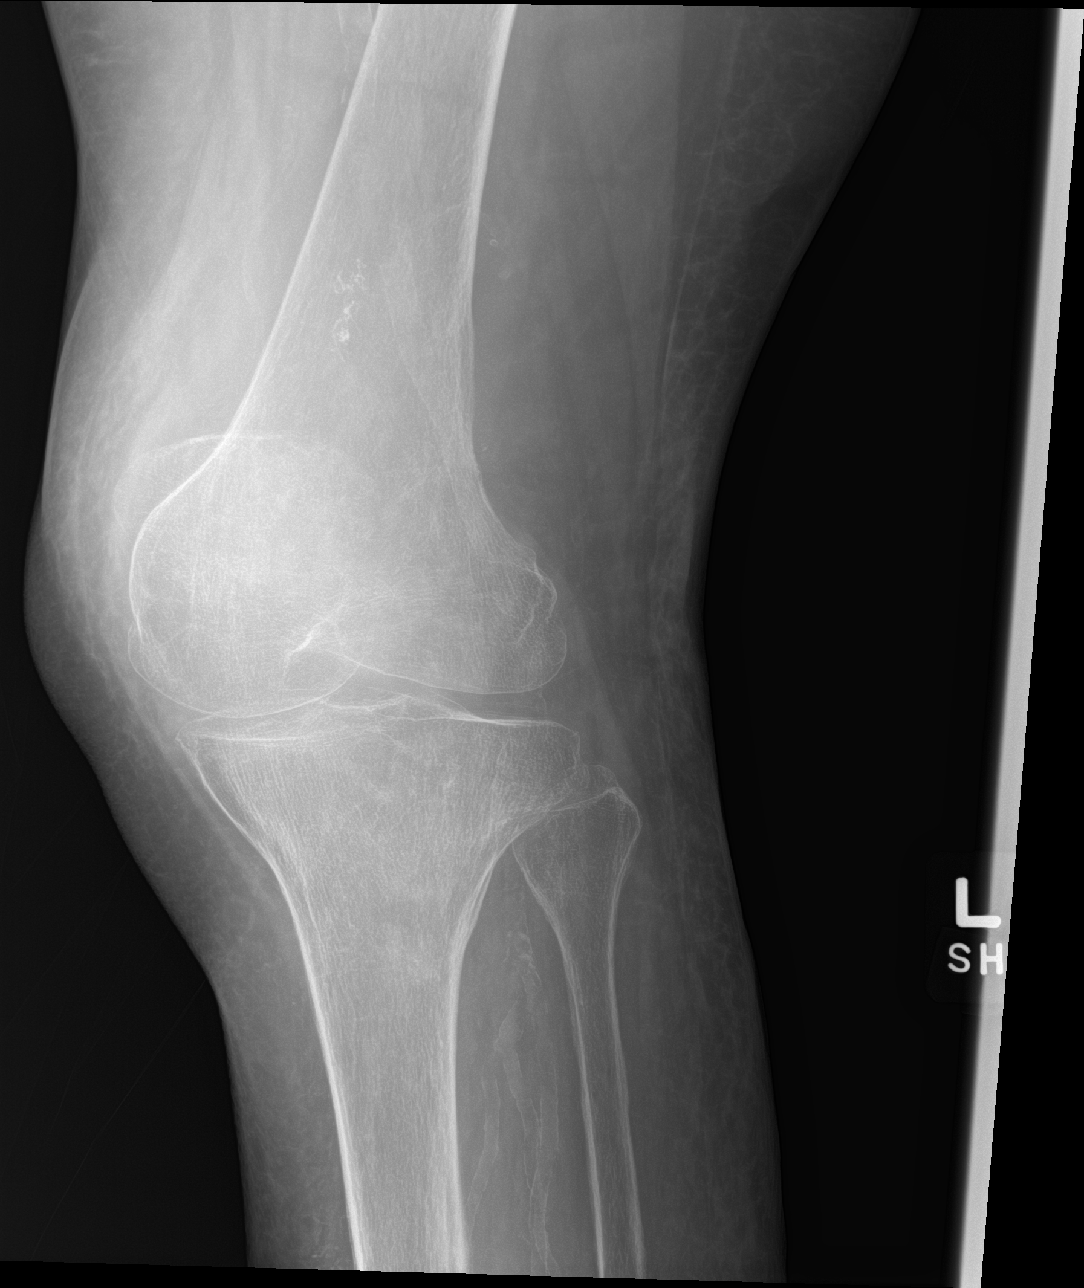

[knee obl (2 of 2)]
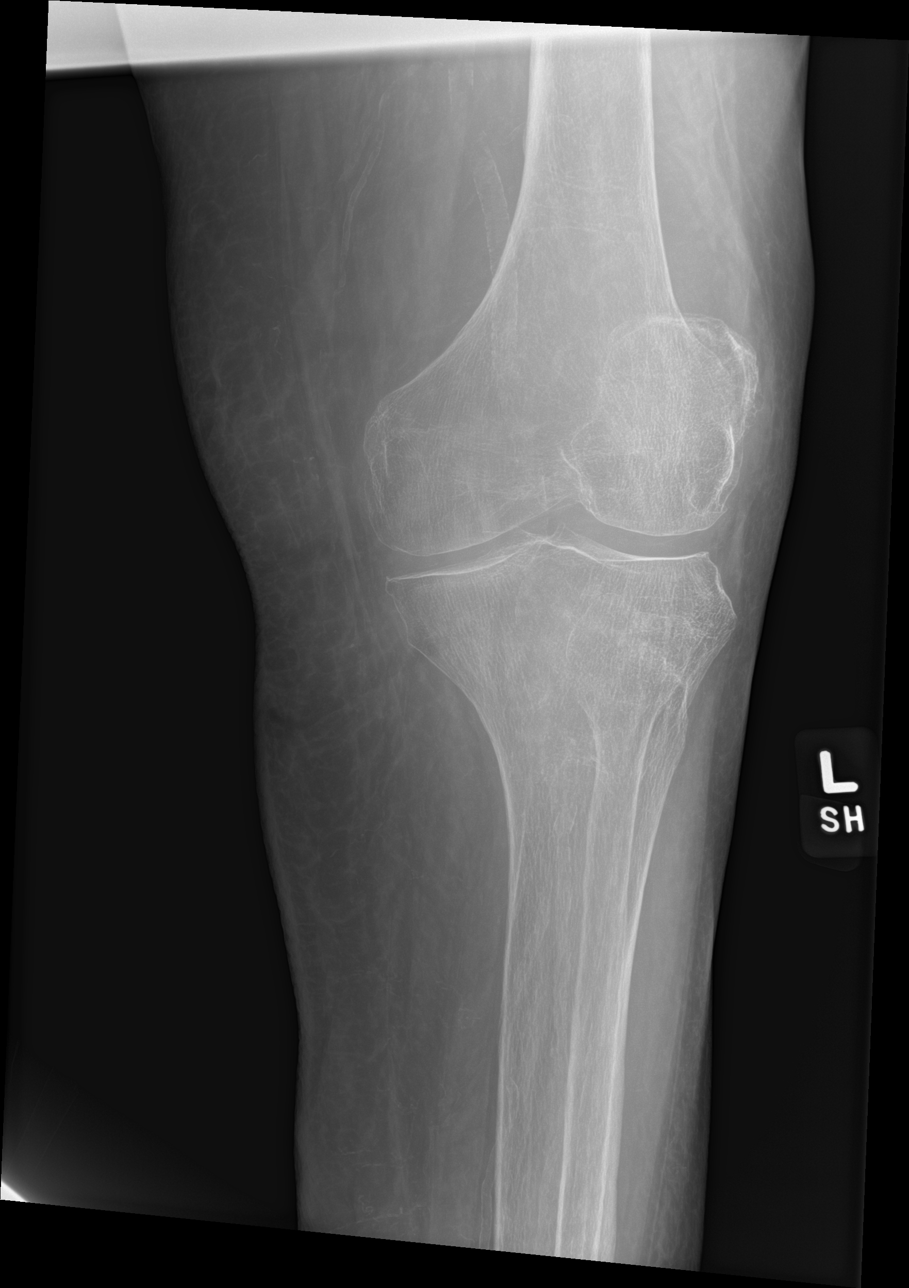

[4 of 4 positions shown; findings below may reference images not displayed]

FINDINGS: The bones are diffusely osteopenic. Small joint effusion present.
Vascular calcifications are seen in the soft tissues. There is no
acute fracture or dislocation identified. There is patellofemoral
compartment joint space narrowing. Alignment is anatomic.
IMPRESSION: 1. Small joint effusion.
2. No acute bony abnormality.
3. Mild degenerative changes.
4. Diffuse osteopenia.

## 2021-11-28 MED ORDER — LACTATED RINGERS IV BOLUS
1000.0000 mL | Freq: Once | INTRAVENOUS | Status: AC
  Administered 2021-11-29: 1000 mL via INTRAVENOUS

## 2021-11-28 MED ORDER — SODIUM CHLORIDE 0.9% FLUSH
3.0000 mL | Freq: Two times a day (BID) | INTRAVENOUS | Status: DC
  Administered 2021-11-28 – 2021-12-03 (×7): 3 mL via INTRAVENOUS

## 2021-11-28 MED ORDER — ONDANSETRON HCL 4 MG/2ML IJ SOLN
4.0000 mg | Freq: Once | INTRAMUSCULAR | Status: AC
  Administered 2021-11-28: 4 mg via INTRAVENOUS
  Filled 2021-11-28: qty 2

## 2021-11-28 MED ORDER — TIZANIDINE HCL 4 MG PO TABS
4.0000 mg | ORAL_TABLET | Freq: Every evening | ORAL | Status: DC | PRN
  Administered 2021-12-01: 4 mg via ORAL
  Filled 2021-11-28: qty 1

## 2021-11-28 MED ORDER — MORPHINE SULFATE (PF) 4 MG/ML IV SOLN
4.0000 mg | Freq: Once | INTRAVENOUS | Status: AC
  Administered 2021-11-28: 4 mg via INTRAVENOUS
  Filled 2021-11-28: qty 1

## 2021-11-28 MED ORDER — SODIUM CHLORIDE 0.9 % IV SOLN
2.0000 g | INTRAVENOUS | Status: DC
  Administered 2021-11-29 – 2021-11-30 (×2): 2 g via INTRAVENOUS
  Filled 2021-11-28 (×2): qty 20

## 2021-11-28 MED ORDER — SENNOSIDES-DOCUSATE SODIUM 8.6-50 MG PO TABS
1.0000 | ORAL_TABLET | Freq: Every evening | ORAL | Status: DC | PRN
  Administered 2021-12-01: 1 via ORAL
  Filled 2021-11-28: qty 1

## 2021-11-28 MED ORDER — LACTATED RINGERS IV BOLUS (SEPSIS)
500.0000 mL | Freq: Once | INTRAVENOUS | Status: AC
  Administered 2021-11-28: 500 mL via INTRAVENOUS

## 2021-11-28 MED ORDER — SODIUM CHLORIDE 0.9 % IV SOLN
1.0000 g | Freq: Once | INTRAVENOUS | Status: AC
  Administered 2021-11-28: 1 g via INTRAVENOUS
  Filled 2021-11-28: qty 10

## 2021-11-28 MED ORDER — ASPIRIN 81 MG PO TBEC
81.0000 mg | DELAYED_RELEASE_TABLET | ORAL | Status: DC
  Administered 2021-11-29 – 2021-12-03 (×3): 81 mg via ORAL
  Filled 2021-11-28 (×3): qty 1

## 2021-11-28 MED ORDER — ENOXAPARIN SODIUM 40 MG/0.4ML IJ SOSY
40.0000 mg | PREFILLED_SYRINGE | INTRAMUSCULAR | Status: DC
  Administered 2021-11-29 – 2021-12-03 (×5): 40 mg via SUBCUTANEOUS
  Filled 2021-11-28 (×5): qty 0.4

## 2021-11-28 MED ORDER — PANTOPRAZOLE SODIUM 40 MG PO TBEC
40.0000 mg | DELAYED_RELEASE_TABLET | Freq: Two times a day (BID) | ORAL | Status: DC
  Administered 2021-11-29 – 2021-12-03 (×9): 40 mg via ORAL
  Filled 2021-11-28 (×9): qty 1

## 2021-11-28 MED ORDER — ACETAMINOPHEN 650 MG RE SUPP: 650.0000 mg | Freq: Four times a day (QID) | RECTAL | Status: DC | PRN

## 2021-11-28 MED ORDER — IBUPROFEN 200 MG PO TABS: 400.0000 mg | ORAL_TABLET | ORAL | Status: DC | PRN

## 2021-11-28 MED ORDER — ACETAMINOPHEN 325 MG PO TABS
650.0000 mg | ORAL_TABLET | Freq: Four times a day (QID) | ORAL | Status: DC | PRN
  Administered 2021-11-30: 650 mg via ORAL
  Filled 2021-11-28: qty 2

## 2021-11-28 MED ORDER — OXYCODONE-ACETAMINOPHEN 5-325 MG PO TABS
1.0000 | ORAL_TABLET | ORAL | Status: DC | PRN
  Administered 2021-11-28: 1 via ORAL
  Filled 2021-11-28 (×2): qty 1

## 2021-11-28 MED ORDER — CARVEDILOL 6.25 MG PO TABS
6.2500 mg | ORAL_TABLET | Freq: Two times a day (BID) | ORAL | Status: DC
  Administered 2021-11-28 – 2021-12-01 (×7): 6.25 mg via ORAL
  Filled 2021-11-28 (×7): qty 1

## 2021-11-28 MED ORDER — OXYCODONE HCL 5 MG PO TABS
5.0000 mg | ORAL_TABLET | ORAL | Status: DC | PRN
  Administered 2021-11-28 – 2021-12-03 (×12): 5 mg via ORAL
  Filled 2021-11-28 (×12): qty 1

## 2021-11-28 MED ORDER — SODIUM CHLORIDE 0.9 % IV SOLN
1.0000 g | Freq: Once | INTRAVENOUS | Status: AC
  Administered 2021-11-29: 1 g via INTRAVENOUS
  Filled 2021-11-28: qty 10

## 2021-11-28 MED ORDER — SODIUM CHLORIDE 0.9 % IV BOLUS
1000.0000 mL | Freq: Once | INTRAVENOUS | Status: AC
  Administered 2021-11-28: 1000 mL via INTRAVENOUS

## 2021-11-28 NOTE — ED Notes (Signed)
Patient transported to CT 

## 2021-11-28 NOTE — ED Notes (Signed)
CRITICAL VALUE STICKER  CRITICAL VALUE:Lactic acid 2.3  RECEIVER (on-site recipient of call):Shawnie Pons, RN  DATE & TIME NOTIFIED:   MESSENGER (representative from lab):  MD NOTIFIED: P.A. Soijet Blue  TIME OF NOTIFICATION:1832  RESPONSE:

## 2021-11-28 NOTE — Progress Notes (Signed)
Lactic acid of 4.3 reported to Opyd MD via secure chat.

## 2021-11-28 NOTE — H&P (Addendum)
History and Physical    [name]:[id number] DOB: 29-Apr-1951 DOA: 11/28/2021  PCP: Pcp, No   Patient coming from: Home   Chief Complaint: Fall with left knee pain   HPI: Heather Garrett is a pleasant 71 y.o. female with medical history significant for coronary artery disease, bladder cancer in remission per patient report, hypertension, OSA not on CPAP, and left hip fracture in February status post total hip arthroplasty, now presenting to the emergency department with left knee pain after fall.  Patient reports a few days of dysuria and low back pain, but had been going about her usual activities and was ambulating with a walker in her home when she fell and developed left knee pain.  She reports frequent dizzy spells to be a longstanding problem for her but she does not recall if she was dizzy at the time of the fall.  She is not exactly sure how she fell.  Aside from the knee pain, she denies any other injury.  She has not had any recent chest pain, cough, shortness of breath, and denies any fever or chills.  She has had some mild nausea without vomiting. Her chronic leg swelling has been worse for a couple months, always more on the left than right. She has had venous US negative for left leg DVT previously.   [facility] ED Course: Upon arrival to the ED, patient is found to be afebrile with tachycardia and stable blood pressure.  ED work-up notable for WBC 15,800, lactic acid 2.3, and pyuria with positive nitrite and > 50 WBC per hpf on UA.  No acute findings on CT of the head or cervical spine.  Plain radiographs of the left knee are negative for acute osseous process but notable for small effusion.  Urine was sent for culture and the patient was treated with morphine, Zofran, Percocet, a liter of saline, and IV Rocephin.  She was transferred to [facility] for admission.  Review of Systems:  All other systems reviewed and apart from HPI, are  negative.  Past Medical History:  Diagnosis Date   Biceps tendinitis    Bladder cancer ([place]) 2014   Breast cancer ([place]) 1990   Cobalamin deficiency    Coronary arteriosclerosis in native artery    Elevated liver function tests    Hiatal hernia    Hyperlipidemia    Migraine    Myocardial infarction ([facility])    Rotator cuff syndrome    Transitional cell carcinoma, bladder (HCC)    Vitamin D deficiency     Past Surgical History:  Procedure Laterality Date   BREAST SURGERY     CARDIAC CATHETERIZATION     CATARACT EXTRACTION     CHOLECYSTECTOMY  1979   DG  BONE DENSITY ([place] [facility])     ENDOMETRIAL ABLATION     GASTRIC BYPASS     MASTECTOMY     OTHER SURGICAL HISTORY     CANCER SURGERY   TOTAL HIP ARTHROPLASTY Left 09/06/2021   Procedure: TOTAL HIP ARTHROPLASTY;  Surgeon: Willaim Sheng, MD;  Location: [facility] ORS;  Service: Orthopedics;  Laterality: Left;    Social History:   reports that she has quit smoking. She has never been exposed to tobacco smoke. She has never used smokeless tobacco. She reports current alcohol use of about 14.0 standard drinks per week. She reports that she does not use drugs.  Allergies  Allergen Reactions   Contrast Media [Iodinated Contrast Media] Anaphylaxis and Hives  Ciprofloxacin Other (See Comments)    Interaction w other medicine   Erythromycin Base Nausea And Vomiting   Sulfabenzamide Other (See Comments)    UNK reaction    Family History  Problem Relation Age of Onset   Congestive Heart Failure Mother    Coronary artery disease Father    Heart attack Father 58       cause of death.   Diabetes Mellitus II Sister    Hypertension Sister    Breast cancer Sister    Migraines Neg Hx    Colon cancer Neg Hx    Esophageal cancer Neg Hx    Stomach cancer Neg Hx      Prior to Admission medications   Medication Sig Start Date End Date Taking? Authorizing Provider  acetaminophen (TYLENOL) 500 MG tablet Take 1,000 mg by mouth every 6  (six) hours as needed for mild pain.    [provider]  aspirin 81 MG EC tablet Take 1 tablet (81 mg total) by mouth every other day. Swallow whole.  Hold aspirin while taking eliquis, 09/10/21   Florencia Reasons, MD  carvedilol (COREG) 6.25 MG tablet Take 1 tablet (6.25 mg total) by mouth 2 (two) times daily. 09/10/21 10/10/21  Florencia Reasons, MD  ezetimibe-simvastatin (VYTORIN) 10-80 MG tablet Take 1 tablet by mouth at bedtime.    [provider]  famotidine (PEPCID) 40 MG tablet Take 40 mg by mouth daily as needed for heartburn or indigestion.    [provider]  ferrous sulfate 325 (65 FE) MG EC tablet Take 1 tablet (325 mg total) by mouth every Monday, Wednesday, and Friday. 09/10/21 10/10/21  Florencia Reasons, MD  folic acid (FOLVITE) 1 MG tablet Take 1 tablet (1 mg total) by mouth daily. 09/11/21   Florencia Reasons, MD  hydrochlorothiazide (HYDRODIURIL) 25 MG tablet Take 25 mg by mouth daily as needed (swelling/fluid).    [provider]  lidocaine (XYLOCAINE) 5 % ointment Apply 1 application topically as needed. Patient not taking: Reported on 11/28/2021 06/30/21   Long, Wonda Olds, MD  NURTEC 75 MG TBDP Take 75 mg by mouth daily as needed for headache. 05/19/21   [provider]  pantoprazole (PROTONIX) 40 MG tablet Take 1 tablet (40 mg total) by mouth 2 (two) times daily before a meal. 11/01/21   Vladimir Crofts, PA-C  polyethylene glycol (MIRALAX / GLYCOLAX) 17 g packet Take 17 g by mouth daily. Patient taking differently: Take 17 g by mouth as needed. 09/11/21   Florencia Reasons, MD  senna-docusate (SENOKOT-S) 8.6-50 MG tablet Take 1 tablet by mouth at bedtime. 09/10/21   Florencia Reasons, MD  sertraline (ZOLOFT) 100 MG tablet Take 100 mg by mouth daily.    [provider]  tiZANidine (ZANAFLEX) 4 MG tablet Take 4 mg by mouth at bedtime.    [provider]    Physical Exam: Vitals:   11/28/21 1900 11/28/21 1915 11/28/21 2030 11/28/21 2133  BP: (!) 131/113  (!) 142/83 (!) 145/110   Pulse: (!) 114  (!) 117 (!) 120  Resp: 12  20   Temp:    98.3 F (36.8 C)  TempSrc:    Oral  SpO2: 93%  93% 94%  Weight:  51.3 kg    Height:  '4\' 8"'$  (1.422 m)      Constitutional: NAD, calm  Eyes: PERTLA, lids and conjunctivae normal ENMT: Mucous membranes are moist. Posterior pharynx clear of any exudate or lesions.   Neck: supple, no masses  Respiratory: no wheezing, no crackles. No accessory muscle use.  Cardiovascular: S1 & S2 heard, regular rate and rhythm. Pitting edema to b/l LEs Lt>Rt.   Abdomen: No distension, no tenderness, soft. Bowel sounds active.  Musculoskeletal: no clubbing / cyanosis. No joint deformity upper and lower extremities.   Skin: no significant rashes, lesions, ulcers. Warm, dry, well-perfused. Neurologic: CN 2-12 grossly intact. Moving all extremities. Alert and oriented.  Psychiatric: Pleasant. Cooperative.    Labs and Imaging on Admission: I have personally reviewed following labs and imaging studies  CBC: Recent Labs  Lab 11/28/21 1437  WBC 15.5*  NEUTROABS 11.2*  HGB 12.1  HCT 37.5  MCV 97.9  PLT 465   Basic Metabolic Panel: Recent Labs  Lab 11/28/21 1437  NA 140  K 4.8  CL 107  CO2 21*  GLUCOSE 149*  BUN 12  CREATININE 0.75  CALCIUM 8.8*   GFR: Estimated Creatinine Clearance: 43.1 mL/min (by C-G formula based on SCr of 0.75 mg/dL). Liver Function Tests: No results for input(s): AST, ALT, ALKPHOS, BILITOT, PROT, ALBUMIN in the last 168 hours. No results for input(s): LIPASE, AMYLASE in the last 168 hours. No results for input(s): AMMONIA in the last 168 hours. Coagulation Profile: No results for input(s): INR, PROTIME in the last 168 hours. Cardiac Enzymes: No results for input(s): CKTOTAL, CKMB, CKMBINDEX, TROPONINI in the last 168 hours. BNP (last 3 results) No results for input(s): PROBNP in the last 8760 hours. HbA1C: No results for input(s): HGBA1C in the last 72 hours. CBG: No results for input(s): GLUCAP in the  last 168 hours. Lipid Profile: No results for input(s): CHOL, HDL, LDLCALC, TRIG, CHOLHDL, LDLDIRECT in the last 72 hours. Thyroid Function Tests: No results for input(s): TSH, T4TOTAL, FREET4, T3FREE, THYROIDAB in the last 72 hours. Anemia Panel: No results for input(s): VITAMINB12, FOLATE, FERRITIN, TIBC, IRON, RETICCTPCT in the last 72 hours. Urine analysis:    Component Value Date/Time   COLORURINE YELLOW 11/28/2021 1723   APPEARANCEUR CLOUDY (A) 11/28/2021 1723   LABSPEC 1.028 11/28/2021 1723   PHURINE 5.5 11/28/2021 1723   GLUCOSEU NEGATIVE 11/28/2021 1723   HGBUR SMALL (A) 11/28/2021 1723   BILIRUBINUR NEGATIVE 11/28/2021 1723   KETONESUR TRACE (A) 11/28/2021 1723   PROTEINUR 100 (A) 11/28/2021 1723   NITRITE POSITIVE (A) 11/28/2021 1723   LEUKOCYTESUR LARGE (A) 11/28/2021 1723   Sepsis Labs: '@LABRCNTIP'$ (procalcitonin:4,lacticidven:4) )No results found for this or any previous visit (from the past 240 hour(s)).   Radiological Exams on Admission: DG Tibia/Fibula Left  Result Date: 11/28/2021 CLINICAL DATA:  Recent fall, knee pain EXAM: LEFT TIBIA AND FIBULA - 2 VIEW COMPARISON:  11/28/2021 FINDINGS: Bones are osteopenic. No acute osseous finding, displaced fracture or malalignment. Peripheral atherosclerosis noted. IMPRESSION: Osteopenia.  No acute finding by plain radiography Peripheral atherosclerosis Electronically Signed   By: Jerilynn Mages.  Shick M.D.   On: 11/28/2021 15:27   CT Head Wo Contrast  Result Date: 11/28/2021 CLINICAL DATA:  Head trauma, minor (Age >= 65y); Neck trauma (Age >= 65y) EXAM: CT HEAD WITHOUT CONTRAST CT CERVICAL SPINE WITHOUT CONTRAST TECHNIQUE: Multidetector CT imaging of the head and cervical spine was performed following the standard protocol without intravenous contrast. Multiplanar CT image reconstructions of the cervical spine were also generated. RADIATION DOSE REDUCTION: This exam was performed according to the departmental dose-optimization program  which includes automated exposure control, adjustment of the mA and/or kV according to patient size and/or use of iterative reconstruction technique. COMPARISON:  MRI 01/18/2021 FINDINGS: CT  HEAD FINDINGS Brain: No evidence of acute infarction, hemorrhage, hydrocephalus, extra-axial collection or mass lesion/mass effect. Patchy low-density changes within the periventricular and subcortical white matter compatible with chronic microvascular ischemic change. Mild-to-moderate diffuse cerebral volume loss. Vascular: No hyperdense vessel or unexpected calcification. Skull: Normal. Negative for fracture or focal lesion. Sinuses/Orbits: No acute finding. Other: None. CT CERVICAL SPINE FINDINGS Alignment: Facet joints are aligned without dislocation or traumatic listhesis. Dens and lateral masses are aligned. Mild grade 1 anterolisthesis of C3 on C4 and C4 on C5 mediated by degenerative facet arthropathy. Skull base and vertebrae: Mild superior endplate compression deformity of T2 with mild sclerosis at the superior endplate suggesting a subacute or chronic process. No evidence of an acute fracture of the cervical spine. No suspicious lytic or sclerotic bone lesion. Soft tissues and spinal canal: No prevertebral fluid or swelling. No visible canal hematoma. Disc levels: Intervertebral disc heights are preserved. Degenerative facet arthropathy within the upper cervical spine. Upper chest: Negative. Other: None. IMPRESSION: 1. No acute intracranial abnormality. 2. No acute cervical spine fracture. 3. Mild superior endplate compression deformity of T2 with mild sclerosis at the superior endplate suggesting a subacute or chronic process. Correlate with point tenderness. 4. Chronic microvascular ischemic change and cerebral volume loss. Electronically Signed   By: Davina Poke D.O.   On: 11/28/2021 15:28   CT Cervical Spine Wo Contrast  Result Date: 11/28/2021 CLINICAL DATA:  Head trauma, minor (Age >= 65y); Neck trauma  (Age >= 65y) EXAM: CT HEAD WITHOUT CONTRAST CT CERVICAL SPINE WITHOUT CONTRAST TECHNIQUE: Multidetector CT imaging of the head and cervical spine was performed following the standard protocol without intravenous contrast. Multiplanar CT image reconstructions of the cervical spine were also generated. RADIATION DOSE REDUCTION: This exam was performed according to the departmental dose-optimization program which includes automated exposure control, adjustment of the mA and/or kV according to patient size and/or use of iterative reconstruction technique. COMPARISON:  MRI 01/18/2021 FINDINGS: CT HEAD FINDINGS Brain: No evidence of acute infarction, hemorrhage, hydrocephalus, extra-axial collection or mass lesion/mass effect. Patchy low-density changes within the periventricular and subcortical white matter compatible with chronic microvascular ischemic change. Mild-to-moderate diffuse cerebral volume loss. Vascular: No hyperdense vessel or unexpected calcification. Skull: Normal. Negative for fracture or focal lesion. Sinuses/Orbits: No acute finding. Other: None. CT CERVICAL SPINE FINDINGS Alignment: Facet joints are aligned without dislocation or traumatic listhesis. Dens and lateral masses are aligned. Mild grade 1 anterolisthesis of C3 on C4 and C4 on C5 mediated by degenerative facet arthropathy. Skull base and vertebrae: Mild superior endplate compression deformity of T2 with mild sclerosis at the superior endplate suggesting a subacute or chronic process. No evidence of an acute fracture of the cervical spine. No suspicious lytic or sclerotic bone lesion. Soft tissues and spinal canal: No prevertebral fluid or swelling. No visible canal hematoma. Disc levels: Intervertebral disc heights are preserved. Degenerative facet arthropathy within the upper cervical spine. Upper chest: Negative. Other: None. IMPRESSION: 1. No acute intracranial abnormality. 2. No acute cervical spine fracture. 3. Mild superior endplate  compression deformity of T2 with mild sclerosis at the superior endplate suggesting a subacute or chronic process. Correlate with point tenderness. 4. Chronic microvascular ischemic change and cerebral volume loss. Electronically Signed   By: Davina Poke D.O.   On: 11/28/2021 15:28   DG Knee Complete 4 Views Left  Result Date: 11/28/2021 CLINICAL DATA:  Fall. EXAM: LEFT KNEE - COMPLETE 4+ VIEW COMPARISON:  None Available. FINDINGS: The bones are diffusely osteopenic.  Small joint effusion present. Vascular calcifications are seen in the soft tissues. There is no acute fracture or dislocation identified. There is patellofemoral compartment joint space narrowing. Alignment is anatomic. IMPRESSION: 1. Small joint effusion. 2. No acute bony abnormality. 3. Mild degenerative changes. 4. Diffuse osteopenia. Electronically Signed   By: Ronney Asters M.D.   On: 11/28/2021 15:28    EKG: Independently reviewed. Sinus rhythm, chronic LBBB, QTc 522 ms.   Assessment/Plan   1. UTI  - Presents with left knee pain after a fall at home, reports a few days of dysuria and back pain, and found to have leukocytosis, mild elevation in lactate, and UA consistent with infection  - Urine culture was sent from ED and [place] was started  - Does not appear septic, qSOFA score is 1 (RR was 22 briefly)  - Continue Rocephin, follow culture and clinical course    Addendum: Lactate increased to 4.3. Plan to get blood cultures, give additional IVF, increase Rocephin to 2 g q24h, and check another lactic acid level in 3 hrs.    2. Left knee pain  - Presents with left knee pain after a fall at home  - No acute fracture on x-rays in ED  - Conservative management, PT eval    3. CAD  - No anginal complaints  - Continue ASA and beta-blocker    4. Hypertension  - Continue Coreg    5. Prolonged QT interval  - QTc 522 ms on EKG in ED  - Check magnesium, avoid QT-prolonging medications    6. Bladder cancer  - In  remission per patient report, scheduled to establish with local urologist     DVT prophylaxis: Lovenox  Code Status: Full  Level of Care: Level of care: Telemetry Family Communication: None present  Disposition Plan:  Patient is from: home  Anticipated d/c is to: TBD Anticipated d/c date is: 11/30/21  Patient currently: Pending PT eval, transition to oral antibiotic  Consults called: none  Admission status: Inpatient    Vianne Bulls, MD [facility]  11/28/2021, 10:14 PM

## 2021-11-28 NOTE — ED Notes (Signed)
Attempted to call report however nurse is off the floor and will call back. Report also given to [facility]. ETA 20 min.

## 2021-11-28 NOTE — ED Provider Notes (Signed)
[facility] DEPT Provider Note   CSN: [id number] Arrival date & time: 11/28/21  1015     History  Chief Complaint  Patient presents with   Knee Injury    Fall    Heather Garrett is a 71 y.o. female who presents to the emergency department with concerns for unwitnessed fall onset last night.  She notes that she was ambulating with her walker when she felt dizzy and fell backwards and hit her head.  Her husband noted that she twisted her left knee.  She notes she has been unable to put pressure on her left knee since the incident.  Has associated hitting her head, resolved nausea, resolved dizziness, left elbow pain with a skin tear.  Denies LOC, vomiting. Denies anticoagulants however notes that she takes a baby aspirin.  Patient also notes that she takes a lot of medications that tend to make her feel dizzy as well.  The history is provided by the patient. No language interpreter was used.      Home Medications Prior to Admission medications   Medication Sig Start Date End Date Taking? Authorizing Provider  acetaminophen (TYLENOL) 500 MG tablet Take 1,000 mg by mouth every 6 (six) hours as needed for mild pain.    [provider]  aspirin 81 MG EC tablet Take 1 tablet (81 mg total) by mouth every other day. Swallow whole.  Hold aspirin while taking eliquis, 09/10/21   Florencia Reasons, MD  carvedilol (COREG) 6.25 MG tablet Take 1 tablet (6.25 mg total) by mouth 2 (two) times daily. 09/10/21 10/10/21  Florencia Reasons, MD  ezetimibe-simvastatin (VYTORIN) 10-80 MG tablet Take 1 tablet by mouth at bedtime.    [provider]  famotidine (PEPCID) 40 MG tablet Take 40 mg by mouth daily as needed for heartburn or indigestion.    [provider]  ferrous sulfate 325 (65 FE) MG EC tablet Take 1 tablet (325 mg total) by mouth every Monday, Wednesday, and Friday. 09/10/21 10/10/21  Florencia Reasons, MD  folic acid (FOLVITE) 1 MG tablet Take 1 tablet (1 mg total) by mouth  daily. 09/11/21   Florencia Reasons, MD  hydrochlorothiazide (HYDRODIURIL) 25 MG tablet Take 25 mg by mouth daily as needed (swelling/fluid).    [provider]  lidocaine (XYLOCAINE) 5 % ointment Apply 1 application topically as needed. Patient not taking: Reported on 11/28/2021 06/30/21   Long, Wonda Olds, MD  NURTEC 75 MG TBDP Take 75 mg by mouth daily as needed for headache. 05/19/21   [provider]  pantoprazole (PROTONIX) 40 MG tablet Take 1 tablet (40 mg total) by mouth 2 (two) times daily before a meal. 11/01/21   Vladimir Crofts, PA-C  polyethylene glycol (MIRALAX / GLYCOLAX) 17 g packet Take 17 g by mouth daily. Patient taking differently: Take 17 g by mouth as needed. 09/11/21   Florencia Reasons, MD  senna-docusate (SENOKOT-S) 8.6-50 MG tablet Take 1 tablet by mouth at bedtime. 09/10/21   Florencia Reasons, MD  sertraline (ZOLOFT) 100 MG tablet Take 100 mg by mouth daily.    [provider]  tiZANidine (ZANAFLEX) 4 MG tablet Take 4 mg by mouth at bedtime.    [provider]      Allergies    Contrast media [iodinated contrast media], Ciprofloxacin, Erythromycin base, and Sulfabenzamide    Review of Systems   Review of Systems  Gastrointestinal:  Positive for nausea (resolved). Negative for vomiting.  Genitourinary:  Positive for dysuria. Negative  for hematuria.  Musculoskeletal:  Positive for arthralgias. Negative for back pain and neck pain.  Neurological:  Positive for dizziness (resolved). Negative for syncope.  All other systems reviewed and are negative.  Physical Exam Updated Vital Signs BP (!) 131/113   Pulse (!) 114   Temp 99.2 F (37.3 C)   Resp 12   Ht '4\' 8"'$  (1.422 m)   Wt 51.3 kg   SpO2 93%   BMI 25.33 kg/m  Physical Exam Vitals and nursing note reviewed.  Constitutional:      General: She is not in acute distress.    Appearance: Normal appearance. She is not diaphoretic.  HENT:     Head: Normocephalic and atraumatic.     Mouth/Throat:     Pharynx:  No oropharyngeal exudate.  Eyes:     General: No scleral icterus.    Extraocular Movements: Extraocular movements intact.     Conjunctiva/sclera: Conjunctivae normal.  Cardiovascular:     Rate and Rhythm: Normal rate and regular rhythm.     Pulses: Normal pulses.     Heart sounds: Normal heart sounds.  Pulmonary:     Effort: Pulmonary effort is normal. No respiratory distress.     Breath sounds: Normal breath sounds. No wheezing.  Abdominal:     General: Bowel sounds are normal.     Palpations: Abdomen is soft. There is no mass.     Tenderness: There is no abdominal tenderness. There is no guarding or rebound.  Musculoskeletal:        General: Normal range of motion.     Cervical back: Normal range of motion and neck supple.     Comments: Decreased flexion of left knee secondary to pain.  No obvious deformity, erythema, effusion, swelling.  Tenderness to palpation noted to superior lateral aspect of left knee without effusion noted.  Moderate swelling noted to left ankle with mild tenderness to palpation noted to the area.  Pedal pulses intact bilaterally.  Normal flexion and extension of left ankle.  No spinal tenderness to palpation.  Tenderness to palpation noted to posterior aspect of bilateral ribs.  Skin:    General: Skin is warm and dry.     Findings: No bruising, erythema or rash.  Neurological:     Mental Status: She is alert.  Psychiatric:        Behavior: Behavior normal.    ED Results / Procedures / Treatments   Labs (all labs ordered are listed, but only abnormal results are displayed) Labs Reviewed  CBC WITH DIFFERENTIAL/PLATELET - Abnormal; Notable for the following components:      Result Value   WBC 15.5 (*)    RBC 3.83 (*)    Neutro Abs 11.2 (*)    Monocytes Absolute 1.3 (*)    Abs Immature Granulocytes 0.09 (*)    All other components within normal limits  BASIC METABOLIC PANEL - Abnormal; Notable for the following components:   CO2 21 (*)    Glucose, Bld  149 (*)    Calcium 8.8 (*)    All other components within normal limits  URINALYSIS, ROUTINE W REFLEX MICROSCOPIC - Abnormal; Notable for the following components:   APPearance CLOUDY (*)    Hgb urine dipstick SMALL (*)    Ketones, ur TRACE (*)    Protein, ur 100 (*)    Nitrite POSITIVE (*)    Leukocytes,Ua LARGE (*)    RBC / HPF >50 (*)    WBC, UA >50 (*)  Bacteria, UA MANY (*)    Non Squamous Epithelial 0-5 (*)    All other components within normal limits  LACTIC ACID, PLASMA - Abnormal; Notable for the following components:   Lactic Acid, Venous 2.3 (*)    All other components within normal limits  URINE CULTURE  LACTIC ACID, PLASMA    EKG EKG Interpretation  Date/Time:  Sunday Nov 28 2021 14:34:13 EDT Ventricular Rate:  118 PR Interval:  117 QRS Duration: 124 QT Interval:  372 QTC Calculation: 522 R Axis:   29 Text Interpretation: Sinus tachycardia Ventricular premature complex Left bundle branch block rate is faster compared to Feb 2023 Confirmed by Sherwood Gambler [phone]) on 11/28/2021 5:02:46 PM  Radiology DG Tibia/Fibula Left  Result Date: 11/28/2021 CLINICAL DATA:  Recent fall, knee pain EXAM: LEFT TIBIA AND FIBULA - 2 VIEW COMPARISON:  11/28/2021 FINDINGS: Bones are osteopenic. No acute osseous finding, displaced fracture or malalignment. Peripheral atherosclerosis noted. IMPRESSION: Osteopenia.  No acute finding by plain radiography Peripheral atherosclerosis Electronically Signed   By: Jerilynn Mages.  Shick M.D.   On: 11/28/2021 15:27   CT Head Wo Contrast  Result Date: 11/28/2021 CLINICAL DATA:  Head trauma, minor (Age >= 65y); Neck trauma (Age >= 65y) EXAM: CT HEAD WITHOUT CONTRAST CT CERVICAL SPINE WITHOUT CONTRAST TECHNIQUE: Multidetector CT imaging of the head and cervical spine was performed following the standard protocol without intravenous contrast. Multiplanar CT image reconstructions of the cervical spine were also generated. RADIATION DOSE REDUCTION: This exam  was performed according to the departmental dose-optimization program which includes automated exposure control, adjustment of the mA and/or kV according to patient size and/or use of iterative reconstruction technique. COMPARISON:  MRI 01/18/2021 FINDINGS: CT HEAD FINDINGS Brain: No evidence of acute infarction, hemorrhage, hydrocephalus, extra-axial collection or mass lesion/mass effect. Patchy low-density changes within the periventricular and subcortical white matter compatible with chronic microvascular ischemic change. Mild-to-moderate diffuse cerebral volume loss. Vascular: No hyperdense vessel or unexpected calcification. Skull: Normal. Negative for fracture or focal lesion. Sinuses/Orbits: No acute finding. Other: None. CT CERVICAL SPINE FINDINGS Alignment: Facet joints are aligned without dislocation or traumatic listhesis. Dens and lateral masses are aligned. Mild grade 1 anterolisthesis of C3 on C4 and C4 on C5 mediated by degenerative facet arthropathy. Skull base and vertebrae: Mild superior endplate compression deformity of T2 with mild sclerosis at the superior endplate suggesting a subacute or chronic process. No evidence of an acute fracture of the cervical spine. No suspicious lytic or sclerotic bone lesion. Soft tissues and spinal canal: No prevertebral fluid or swelling. No visible canal hematoma. Disc levels: Intervertebral disc heights are preserved. Degenerative facet arthropathy within the upper cervical spine. Upper chest: Negative. Other: None. IMPRESSION: 1. No acute intracranial abnormality. 2. No acute cervical spine fracture. 3. Mild superior endplate compression deformity of T2 with mild sclerosis at the superior endplate suggesting a subacute or chronic process. Correlate with point tenderness. 4. Chronic microvascular ischemic change and cerebral volume loss. Electronically Signed   By: Davina Poke D.O.   On: 11/28/2021 15:28   CT Cervical Spine Wo Contrast  Result Date:  11/28/2021 CLINICAL DATA:  Head trauma, minor (Age >= 65y); Neck trauma (Age >= 65y) EXAM: CT HEAD WITHOUT CONTRAST CT CERVICAL SPINE WITHOUT CONTRAST TECHNIQUE: Multidetector CT imaging of the head and cervical spine was performed following the standard protocol without intravenous contrast. Multiplanar CT image reconstructions of the cervical spine were also generated. RADIATION DOSE REDUCTION: This exam was performed according to the departmental dose-optimization  program which includes automated exposure control, adjustment of the mA and/or kV according to patient size and/or use of iterative reconstruction technique. COMPARISON:  MRI 01/18/2021 FINDINGS: CT HEAD FINDINGS Brain: No evidence of acute infarction, hemorrhage, hydrocephalus, extra-axial collection or mass lesion/mass effect. Patchy low-density changes within the periventricular and subcortical white matter compatible with chronic microvascular ischemic change. Mild-to-moderate diffuse cerebral volume loss. Vascular: No hyperdense vessel or unexpected calcification. Skull: Normal. Negative for fracture or focal lesion. Sinuses/Orbits: No acute finding. Other: None. CT CERVICAL SPINE FINDINGS Alignment: Facet joints are aligned without dislocation or traumatic listhesis. Dens and lateral masses are aligned. Mild grade 1 anterolisthesis of C3 on C4 and C4 on C5 mediated by degenerative facet arthropathy. Skull base and vertebrae: Mild superior endplate compression deformity of T2 with mild sclerosis at the superior endplate suggesting a subacute or chronic process. No evidence of an acute fracture of the cervical spine. No suspicious lytic or sclerotic bone lesion. Soft tissues and spinal canal: No prevertebral fluid or swelling. No visible canal hematoma. Disc levels: Intervertebral disc heights are preserved. Degenerative facet arthropathy within the upper cervical spine. Upper chest: Negative. Other: None. IMPRESSION: 1. No acute intracranial  abnormality. 2. No acute cervical spine fracture. 3. Mild superior endplate compression deformity of T2 with mild sclerosis at the superior endplate suggesting a subacute or chronic process. Correlate with point tenderness. 4. Chronic microvascular ischemic change and cerebral volume loss. Electronically Signed   By: Davina Poke D.O.   On: 11/28/2021 15:28   DG Knee Complete 4 Views Left  Result Date: 11/28/2021 CLINICAL DATA:  Fall. EXAM: LEFT KNEE - COMPLETE 4+ VIEW COMPARISON:  None Available. FINDINGS: The bones are diffusely osteopenic. Small joint effusion present. Vascular calcifications are seen in the soft tissues. There is no acute fracture or dislocation identified. There is patellofemoral compartment joint space narrowing. Alignment is anatomic. IMPRESSION: 1. Small joint effusion. 2. No acute bony abnormality. 3. Mild degenerative changes. 4. Diffuse osteopenia. Electronically Signed   By: Ronney Asters M.D.   On: 11/28/2021 15:28    Procedures Procedures    Medications Ordered in ED Medications  oxyCODONE-acetaminophen (PERCOCET/ROXICET) 5-325 MG per tablet 1 tablet (1 tablet Oral Given 11/28/21 1513)  sodium chloride 0.9 % bolus 1,000 mL (1,000 mLs Intravenous New Bag/Given 11/28/21 1757)  ondansetron (ZOFRAN) injection 4 mg (4 mg Intravenous Given 11/28/21 1746)  morphine (PF) 4 MG/ML injection 4 mg (4 mg Intravenous Given 11/28/21 1746)  cefTRIAXone (ROCEPHIN) 1 g in sodium chloride 0.9 % 100 mL IVPB (0 g Intravenous Stopped 11/28/21 8250)    ED Course/ Medical Decision Making/ A&P Clinical Course as of 11/28/21 2032  Sun Nov 28, 2021  1955 Consult with hospitalist Dr. Nevada Crane who agrees with admission at this time [SB]    Clinical Course User Index [SB] Roddrick Sharron A, PA-C                           Medical Decision Making Amount and/or Complexity of Data Reviewed Labs: ordered. Radiology: ordered.  Risk Prescription drug management. Decision regarding  hospitalization.   Pt with fall occurring prior to arrival. Denies hitting their head, LOC, headache, vision changes. Vital signs patient tachycardic, afebrile. On exam, patient with decreased flexion of left knee secondary to pain.  No obvious deformity, erythema, effusion, swelling.  Tenderness to palpation noted to superior lateral aspect of left knee without effusion noted.  Moderate swelling noted to left ankle with  mild tenderness to palpation noted to the area.  Pedal pulses intact bilaterally.  Normal flexion and extension of left ankle.  No spinal tenderness to palpation.  Tenderness to palpation noted to posterior aspect of bilateral ribs. No acute cardiovascular, respiratory, or abdominal exam findings. Differential diagnosis includes fracture, dislocation, herniation, arrhythmia, anemia, acute cystitis, intracranial abnormality.    Labs:  I ordered, and personally interpreted labs.  The pertinent results include:   CBC with leukocytosis at 15.5 otherwise unremarkable. Initial lactic elevated at 2.3.  Repeat lactic pending at time of admission BMP with slightly elevated glucose of 149 otherwise unremarkable. Urinalysis positive for nitrates, large leukocytes, small amount of hemoglobin.  Urine culture sent.  Imaging: I ordered imaging studies including CT head, CT cervical spine, left knee x-ray, left tib-fib I independently visualized and interpreted imaging which showed: No acute fracture noted to x-rays.  No acute intracranial abnormality noted to CT head, no acute herniation noted to CT cervical spine. I agree with the radiologist interpretation  Medications:  I ordered medication including Percocet, Zofran, morphine for pain management.  Patient given Rocephin to treat UTI Reevaluation of the patient after these medicines and interventions, I reevaluated the patient and found that they have improved I have reviewed the patients home medicines and have made adjustments as  needed  Consultations: I requested consultation with the Hospitalist, Dr. Nevada Crane and discussed lab and imaging findings as well as pertinent plan - they recommend: Admission to the hospital at [facility]   Disposition: Presentation suspicious for acute cystitis as the cause of patient's fall today.  Doubt fracture, dislocation, herniation, anemia, arrhythmia at this time. After consideration of the diagnostic results and the patients response to treatment, I feel that the patient would benefit from Admission to the hospital.  Case discussed with attending who also evaluated patient and agrees with admission to the hospital at this time.  Discussed with patient at bedside that she will be admitted to the hospital due to her initial presentation and the UTI.  Answered all available questions.  Patient appears safe for admission at this time.    This chart was dictated using voice recognition software, Dragon. Despite the best efforts of this provider to proofread and correct errors, errors may still occur which can change documentation meaning.   Final Clinical Impression(s) / ED Diagnoses Final diagnoses:  Acute cystitis with hematuria    Rx / DC Orders ED Discharge Orders     None         Zari Cly A, PA-C 11/28/21 2032    Tegeler, Gwenyth Allegra, MD 11/29/21 1104

## 2021-11-28 NOTE — ED Triage Notes (Signed)
Pt arrived via [facility] from home. Per EMS, pt had mechanical fall at approx 1800 last night. Pt caox4 c/o left knee pain 10/10, denies hitting head, denies LOC, does not take blood thinner medication, no obvious neuro deficit while in EMS care. EMS reports some swelling around the knee however symmetrical with R knee and that pt stated to them she has swelling around both knees at baseline.   EMS VS BP 138/68 HR 104 RR 16 SpO2 97% RA

## 2021-11-29 DIAGNOSIS — N3000 Acute cystitis without hematuria: Secondary | ICD-10-CM | POA: Diagnosis not present

## 2021-11-29 DIAGNOSIS — N3001 Acute cystitis with hematuria: Secondary | ICD-10-CM

## 2021-11-29 DIAGNOSIS — I10 Essential (primary) hypertension: Secondary | ICD-10-CM | POA: Diagnosis not present

## 2021-11-29 DIAGNOSIS — R9431 Abnormal electrocardiogram [ECG] [EKG]: Secondary | ICD-10-CM | POA: Diagnosis not present

## 2021-11-29 LAB — CBC
HCT: 33.2 % — ABNORMAL LOW (ref 36.0–46.0)
Hemoglobin: 10.8 g/dL — ABNORMAL LOW (ref 12.0–15.0)
MCH: 33.4 pg (ref 26.0–34.0)
MCHC: 32.5 g/dL (ref 30.0–36.0)
MCV: 102.8 fL — ABNORMAL HIGH (ref 80.0–100.0)
Platelets: 166 10*3/uL (ref 150–400)
RBC: 3.23 MIL/uL — ABNORMAL LOW (ref 3.87–5.11)
RDW: 14.5 % (ref 11.5–15.5)
WBC: 12.3 10*3/uL — ABNORMAL HIGH (ref 4.0–10.5)
nRBC: 0 % (ref 0.0–0.2)

## 2021-11-29 LAB — BASIC METABOLIC PANEL
Anion gap: 10 (ref 5–15)
BUN: 12 mg/dL (ref 8–23)
CO2: 20 mmol/L — ABNORMAL LOW (ref 22–32)
Calcium: 8.4 mg/dL — ABNORMAL LOW (ref 8.9–10.3)
Chloride: 113 mmol/L — ABNORMAL HIGH (ref 98–111)
Creatinine, Ser: 0.68 mg/dL (ref 0.44–1.00)
GFR, Estimated: >60 mL/min (ref >60)
Glucose, Bld: 144 mg/dL — ABNORMAL HIGH (ref 70–99)
Potassium: 4.6 mmol/L (ref 3.5–5.1)
Sodium: 143 mmol/L (ref 135–145)

## 2021-11-29 LAB — LACTIC ACID, PLASMA
Lactic Acid, Venous: 2.2 mmol/L (ref 0.5–1.9)
Lactic Acid, Venous: 1.9 mmol/L (ref 0.5–1.9)

## 2021-11-29 MED ORDER — SERTRALINE HCL 100 MG PO TABS
100.0000 mg | ORAL_TABLET | Freq: Every day | ORAL | Status: DC
  Administered 2021-11-29 – 2021-12-03 (×5): 100 mg via ORAL
  Filled 2021-11-29 (×5): qty 1

## 2021-11-29 MED ORDER — SODIUM CHLORIDE 0.45 % IV SOLN
INTRAVENOUS | Status: DC
Start: 2021-11-29 — End: 2021-12-02

## 2021-11-29 MED ORDER — MELATONIN 3 MG PO TABS
3.0000 mg | ORAL_TABLET | Freq: Every day | ORAL | Status: DC
  Administered 2021-11-29 – 2021-12-02 (×4): 3 mg via ORAL
  Filled 2021-11-29 (×4): qty 1

## 2021-11-29 NOTE — Progress Notes (Signed)
   11/28/21 2133  Assess: MEWS Score  Temp 98.3 F (36.8 C)  BP (!) 145/110  Pulse Rate (!) 120  Resp 18  Level of Consciousness Alert  SpO2 94 %  O2 Device Room Air  Patient Activity (if Appropriate) In bed  Assess: MEWS Score  MEWS Temp 0  MEWS Systolic 0  MEWS Pulse 2  MEWS RR 0  MEWS LOC 0  MEWS Score 2  MEWS Score Color Yellow  Assess: if the MEWS score is Yellow or Red  Were vital signs taken at a resting state? Yes  Focused Assessment No change from prior assessment  Does the patient meet 2 or more of the SIRS criteria? Yes  Does the patient have a confirmed or suspected source of infection? Yes  Provider and Rapid Response Notified? Yes  MEWS guidelines implemented *See Row Information* Yes  Treat  MEWS Interventions Administered scheduled meds/treatments;Administered prn meds/treatments;Other (Comment) (MD notified)  Pain Scale 0-10  Pain Score 6  Pain Type Acute pain  Pain Location Knee  Pain Orientation Left  Pain Descriptors / Indicators Aching  Pain Frequency Constant  Pain Intervention(s) Medication (See eMAR)  Take Vital Signs  Increase Vital Sign Frequency  Yellow: Q 2hr X 2 then Q 4hr X 2, if remains yellow, continue Q 4hrs  Escalate  MEWS: Escalate Yellow: discuss with charge nurse/RN and consider discussing with provider and RRT  Notify: Charge Nurse/RN  Name of Charge Nurse/RN Notified Vera RN  Date Charge Nurse/RN Notified 11/28/21  Time Charge Nurse/RN Notified 2140  Notify: Provider  Provider Name/Title Celso Sickle MD  Date Provider Notified 11/28/21  Time Provider Notified 2140  Method of Notification Page ([place])  Notification Reason Critical result;Other (Comment) (CRITICAL RESULT LACTIC ACID 4.3)  Provider response Evaluate remotely;See new orders  Date of Provider Response 11/28/21  Time of Provider Response 2142  Document  Patient Outcome Stabilized after interventions  Progress note created (see row info) Yes  Assess:  SIRS CRITERIA  SIRS Temperature  0  SIRS Pulse 1  SIRS Respirations  0  SIRS WBC 1  SIRS Score Sum  2

## 2021-11-29 NOTE — Evaluation (Signed)
Physical Therapy Evaluation Patient Details Name: Heather Garrett MRN: [id number] DOB: 01-Dec-1950 Today's Date: 11/29/2021  History of Present Illness  71 yo female admitted with UTI, fall at home, L LE pain/weakness. Hx of L THA for femoral neck fx 08/2021, L pubic rami fx 2/23, falls, osteopenia, bladder Ca, CAD, MI, OSA  Clinical Impression  On eval, pt required Max A for bed mobility. She sat EOB for at least 8 minutes with Min guard assist. She did c/o dizziness while sitting EOB. Attempted sit to stand transfer x 3-pt unable with +1 assist and due to pain in L LE. Moderate pain with activity. O2 83% on RA at rest, 90% on 2L at rest; mobilized on 2L. Discussed d/c plan-recommend ST SNF rehab. Will plan to follow pt during hospital stay.      Recommendations for follow up therapy are one component of a multi-disciplinary discharge planning process, led by the attending physician.  Recommendations may be updated based on patient status, additional functional criteria and insurance authorization.  Follow Up Recommendations Skilled nursing-short term rehab (<3 hours/day)    Assistance Recommended at Discharge Frequent or constant Supervision/Assistance  Patient can return home with the following  Two people to help with walking and/or transfers;Two people to help with bathing/dressing/bathroom;Assistance with cooking/housework;Assist for transportation;Help with stairs or ramp for entrance    Equipment Recommendations None recommended by PT  Recommendations for Other Services  OT consult    Functional Status Assessment Patient has had a recent decline in their functional status and demonstrates the ability to make significant improvements in function in a reasonable and predictable amount of time.     Precautions / Restrictions Precautions Precautions: Fall Precaution Comments: L LE pain-pt wearing knee sleeve issued in ED Restrictions Weight Bearing Restrictions: No LLE Weight  Bearing: Weight bearing as tolerated      Mobility  Bed Mobility Overal bed mobility: Needs Assistance Bed Mobility: Supine to Sit, Sit to Supine     Supine to sit: Max assist, HOB elevated Sit to supine: Mod assist   General bed mobility comments: Increased time. Cues required. Assist for trunk and bil LEs. Utilized bedpad for scooting, positioning. Pt reported dizziness while sitting EOB.    Transfers Overall transfer level: Needs assistance Equipment used: Rolling walker (2 wheels)               General transfer comment: Attempted sit to stand with youth RW x 3-pt unable to stand with +1 assist. She was also unable to laterally scoot towards [facility]    Ambulation/Gait                  Stairs            Wheelchair Mobility    Modified Rankin (Stroke Patients Only)       Balance Overall balance assessment: Needs assistance, History of Falls Sitting-balance support: Bilateral upper extremity supported, Feet supported Sitting balance-Leahy Scale: Fair       Standing balance-Leahy Scale: Zero                               Pertinent Vitals/Pain Pain Assessment Pain Assessment: Faces Faces Pain Scale: Hurts even more Pain Location: L LE Pain Descriptors / Indicators: Discomfort, Sore, Aching, Guarding, Grimacing Pain Intervention(s): Limited activity within patient's tolerance, Monitored during session, Repositioned    Home Living Family/patient expects to be discharged to:: Private residence Living Arrangements: Spouse/significant other Available  Help at Discharge: Family Type of Home: House Home Access: Stairs to enter   [employer] of Steps: 1   Home Layout: One level Home Equipment: Conservation officer, nature (2 wheels);Rollator (4 wheels);Wheelchair - manual;BSC/3in1      Prior Function Prior Level of Function : Independent/Modified Independent                     Journalist, newspaper        Extremity/Trunk  Assessment   Upper Extremity Assessment Upper Extremity Assessment: Defer to OT evaluation    Lower Extremity Assessment Lower Extremity Assessment: Generalized weakness (unable to assess L LE due to pain; swelling noted around knee and ankle)    Cervical / Trunk Assessment Cervical / Trunk Assessment: Kyphotic  Communication   Communication: No difficulties  Cognition Arousal/Alertness: Awake/alert Behavior During Therapy: WFL for tasks assessed/performed Overall Cognitive Status: Within Functional Limits for tasks assessed                                          General Comments      Exercises     Assessment/Plan    PT Assessment Patient needs continued PT services  PT Problem List Decreased strength;Decreased mobility;Decreased range of motion;Decreased activity tolerance;Decreased balance;Decreased knowledge of use of DME;Pain       PT Treatment Interventions DME instruction;Gait training;Therapeutic activities;Therapeutic exercise;Patient/family education;Balance training;Functional mobility training    PT Goals (Current goals can be found in the Care Plan section)  Acute Rehab PT Goals Patient Stated Goal: less pain. regain PLOF/independence PT Goal Formulation: With patient Time For Goal Achievement: 12/13/21 Potential to Achieve Goals: Good    Frequency Min 2X/week     Co-evaluation               AM-PAC PT "6 Clicks" Mobility  Outcome Measure Help needed turning from your back to your side while in a flat bed without using bedrails?: A Lot Help needed moving from lying on your back to sitting on the side of a flat bed without using bedrails?: A Lot Help needed moving to and from a bed to a chair (including a wheelchair)?: Total Help needed standing up from a chair using your arms (e.g., wheelchair or bedside chair)?: Total Help needed to walk in hospital room?: Total Help needed climbing 3-5 steps with a railing? : Total 6 Click  Score: 8    End of Session Equipment Utilized During Treatment: Gait belt Activity Tolerance: Patient limited by pain;Patient limited by fatigue Patient left: in bed;with call bell/phone within reach;with bed alarm set   PT Visit Diagnosis: Pain;History of falling (Z91.81);Difficulty in walking, not elsewhere classified (R26.2);Muscle weakness (generalized) (M62.81);Other abnormalities of gait and mobility (R26.89) Pain - Right/Left: Left Pain - part of body: Leg;Ankle and joints of foot    Time: 1538-1610 PT Time Calculation (min) (ACUTE ONLY): 32 min   Charges:   PT Evaluation $PT Eval Moderate Complexity: 1 Mod PT Treatments $Therapeutic Activity: 8-22 mins           Doreatha Massed, PT Acute Rehabilitation  Office: [phone] Pager: [phone]

## 2021-11-29 NOTE — Progress Notes (Signed)
[facility] PROGRESS NOTE    Progress Note  Heather Garrett  ZLD:[id number] DOB: Jun 08, 1951 DOA: 11/28/2021 PCP: Merryl Hacker, No     Brief Narrative:   Heather Garrett is an 71 y.o. female past medical history significant for coronary artery disease, bladder cancer in remission essential hypertension obstructive sleep apnea not on CPAP history of total left hip arthroplasty comes into the emergency room with left knee pain after a fall she also relates some dysuria and low back pain, frequent spells of lightheadedness upon standing in the ED was found to be tachycardic blood pressure stable white count of 15 lactic acid of 2.3 positive nitrates and greater than 50 white blood cells in urine CT scan of the head and cervical C-spine showed no acute findings.   Assessment/Plan:   UTI (urinary tract infection) Empirically on Rocephin does not appear septic. Urine cultures and blood cultures were sent. Out of bed to chair consult physical therapy she is anorexic. Lactic acidosis has resolved.  Left knee pain: X-ray of the knee showed no fracture, small joint effusion and mild degenerative disease and osteopenia. Out of bed to chair continue analgesics. Consult physical therapy.  CAD: Asymptomatic continue aspirin and beta-blockers.  Essential hypertension: Continue Coreg and hold all other antihypertensive medication.  Prolonged QTc: Try to keep potassium greater than 4 magnesium greater than 2.  History of bladder cancer: In remission follow-up with urology as an outpatient.     DVT prophylaxis: lovenox Family Communication:Husband Status is: Inpatient Remains inpatient appropriate because: UTI causing lightheadedness.    Code Status:     Code Status Orders  (From admission, onward)           Start     Ordered   11/28/21 2151  Full code  Continuous        11/28/21 2154           Code Status History     Date Active Date Inactive Code Status Order  ID Comments User Context   09/05/2021 1305 09/10/2021 1715 Full Code [id number]  Reubin Milan, MD Inpatient   09/05/2021 1305 09/05/2021 1305 Full Code [id number]  Reubin Milan, MD Inpatient         IV Access:   Peripheral IV   Procedures and diagnostic studies:   DG Tibia/Fibula Left  Result Date: 11/28/2021 CLINICAL DATA:  Recent fall, knee pain EXAM: LEFT TIBIA AND FIBULA - 2 VIEW COMPARISON:  11/28/2021 FINDINGS: Bones are osteopenic. No acute osseous finding, displaced fracture or malalignment. Peripheral atherosclerosis noted. IMPRESSION: Osteopenia.  No acute finding by plain radiography Peripheral atherosclerosis Electronically Signed   By: Jerilynn Mages.  Shick M.D.   On: 11/28/2021 15:27   CT Head Wo Contrast  Result Date: 11/28/2021 CLINICAL DATA:  Head trauma, minor (Age >= 65y); Neck trauma (Age >= 65y) EXAM: CT HEAD WITHOUT CONTRAST CT CERVICAL SPINE WITHOUT CONTRAST TECHNIQUE: Multidetector CT imaging of the head and cervical spine was performed following the standard protocol without intravenous contrast. Multiplanar CT image reconstructions of the cervical spine were also generated. RADIATION DOSE REDUCTION: This exam was performed according to the departmental dose-optimization program which includes automated exposure control, adjustment of the mA and/or kV according to patient size and/or use of iterative reconstruction technique. COMPARISON:  MRI 01/18/2021 FINDINGS: CT HEAD FINDINGS Brain: No evidence of acute infarction, hemorrhage, hydrocephalus, extra-axial collection or mass lesion/mass effect. Patchy low-density changes within the periventricular and subcortical white matter compatible with chronic microvascular ischemic change. Mild-to-moderate diffuse cerebral  volume loss. Vascular: No hyperdense vessel or unexpected calcification. Skull: Normal. Negative for fracture or focal lesion. Sinuses/Orbits: No acute finding. Other: None. CT CERVICAL SPINE FINDINGS Alignment:  Facet joints are aligned without dislocation or traumatic listhesis. Dens and lateral masses are aligned. Mild grade 1 anterolisthesis of C3 on C4 and C4 on C5 mediated by degenerative facet arthropathy. Skull base and vertebrae: Mild superior endplate compression deformity of T2 with mild sclerosis at the superior endplate suggesting a subacute or chronic process. No evidence of an acute fracture of the cervical spine. No suspicious lytic or sclerotic bone lesion. Soft tissues and spinal canal: No prevertebral fluid or swelling. No visible canal hematoma. Disc levels: Intervertebral disc heights are preserved. Degenerative facet arthropathy within the upper cervical spine. Upper chest: Negative. Other: None. IMPRESSION: 1. No acute intracranial abnormality. 2. No acute cervical spine fracture. 3. Mild superior endplate compression deformity of T2 with mild sclerosis at the superior endplate suggesting a subacute or chronic process. Correlate with point tenderness. 4. Chronic microvascular ischemic change and cerebral volume loss. Electronically Signed   By: Davina Poke D.O.   On: 11/28/2021 15:28   CT Cervical Spine Wo Contrast  Result Date: 11/28/2021 CLINICAL DATA:  Head trauma, minor (Age >= 65y); Neck trauma (Age >= 65y) EXAM: CT HEAD WITHOUT CONTRAST CT CERVICAL SPINE WITHOUT CONTRAST TECHNIQUE: Multidetector CT imaging of the head and cervical spine was performed following the standard protocol without intravenous contrast. Multiplanar CT image reconstructions of the cervical spine were also generated. RADIATION DOSE REDUCTION: This exam was performed according to the departmental dose-optimization program which includes automated exposure control, adjustment of the mA and/or kV according to patient size and/or use of iterative reconstruction technique. COMPARISON:  MRI 01/18/2021 FINDINGS: CT HEAD FINDINGS Brain: No evidence of acute infarction, hemorrhage, hydrocephalus, extra-axial collection or  mass lesion/mass effect. Patchy low-density changes within the periventricular and subcortical white matter compatible with chronic microvascular ischemic change. Mild-to-moderate diffuse cerebral volume loss. Vascular: No hyperdense vessel or unexpected calcification. Skull: Normal. Negative for fracture or focal lesion. Sinuses/Orbits: No acute finding. Other: None. CT CERVICAL SPINE FINDINGS Alignment: Facet joints are aligned without dislocation or traumatic listhesis. Dens and lateral masses are aligned. Mild grade 1 anterolisthesis of C3 on C4 and C4 on C5 mediated by degenerative facet arthropathy. Skull base and vertebrae: Mild superior endplate compression deformity of T2 with mild sclerosis at the superior endplate suggesting a subacute or chronic process. No evidence of an acute fracture of the cervical spine. No suspicious lytic or sclerotic bone lesion. Soft tissues and spinal canal: No prevertebral fluid or swelling. No visible canal hematoma. Disc levels: Intervertebral disc heights are preserved. Degenerative facet arthropathy within the upper cervical spine. Upper chest: Negative. Other: None. IMPRESSION: 1. No acute intracranial abnormality. 2. No acute cervical spine fracture. 3. Mild superior endplate compression deformity of T2 with mild sclerosis at the superior endplate suggesting a subacute or chronic process. Correlate with point tenderness. 4. Chronic microvascular ischemic change and cerebral volume loss. Electronically Signed   By: Davina Poke D.O.   On: 11/28/2021 15:28   DG Knee Complete 4 Views Left  Result Date: 11/28/2021 CLINICAL DATA:  Fall. EXAM: LEFT KNEE - COMPLETE 4+ VIEW COMPARISON:  None Available. FINDINGS: The bones are diffusely osteopenic. Small joint effusion present. Vascular calcifications are seen in the soft tissues. There is no acute fracture or dislocation identified. There is patellofemoral compartment joint space narrowing. Alignment is anatomic.  IMPRESSION: 1. Small joint  effusion. 2. No acute bony abnormality. 3. Mild degenerative changes. 4. Diffuse osteopenia. Electronically Signed   By: Ronney Asters M.D.   On: 11/28/2021 15:28     Medical Consultants:   None.   Subjective:    Heather Garrett relates knee pain is controlled tolerating her diet. Would like to get out of bed  Objective:    Vitals:   11/28/21 2133 11/28/21 2359 11/29/21 0516 11/29/21 0538  BP: (!) 145/110 118/85 134/86   Pulse: (!) 120 95 98   Resp: '18 18 18   '$ Temp: 98.3 F (36.8 C) 98 F (36.7 C) 99.1 F (37.3 C)   TempSrc: Oral Oral Oral   SpO2: 94% 93% (!) 84% 94%  Weight:      Height:       SpO2: 94 %   Intake/Output Summary (Last 24 hours) at 11/29/2021 0808 Last data filed at 11/29/2021 0300 Gross per 24 hour  Intake 1223.14 ml  Output --  Net 1223.14 ml   Filed Weights   11/28/21 1915  Weight: 51.3 kg    Exam: General exam: In no acute distress. Respiratory system: Good air movement and clear to auscultation. Cardiovascular system: S1 & S2 heard, RRR. No JVD. Gastrointestinal system: Abdomen is nondistended, soft and nontender.  Extremities: No pedal edema. Skin: No rashes, lesions or ulcers Psychiatry: Judgement and insight appear normal. Mood & affect appropriate.    Data Reviewed:    Labs: Basic Metabolic Panel: Recent Labs  Lab 11/28/21 1437 11/28/21 2210 11/29/21 0235  NA 140  --  143  K 4.8  --  4.6  CL 107  --  113*  CO2 21*  --  20*  GLUCOSE 149*  --  144*  BUN 12  --  12  CREATININE 0.75  --  0.68  CALCIUM 8.8*  --  8.4*  MG  --  2.0  --    GFR Estimated Creatinine Clearance: 43.1 mL/min (by C-G formula based on SCr of 0.68 mg/dL). Liver Function Tests: No results for input(s): AST, ALT, ALKPHOS, BILITOT, PROT, ALBUMIN in the last 168 hours. No results for input(s): LIPASE, AMYLASE in the last 168 hours. No results for input(s): AMMONIA in the last 168 hours. Coagulation profile No results  for input(s): INR, PROTIME in the last 168 hours. COVID-19 Labs  No results for input(s): DDIMER, FERRITIN, LDH, CRP in the last 72 hours.  Lab Results  Component Value Date   [place] NEGATIVE 09/05/2021    CBC: Recent Labs  Lab 11/28/21 1437 11/29/21 0235  WBC 15.5* 12.3*  NEUTROABS 11.2*  --   HGB 12.1 10.8*  HCT 37.5 33.2*  MCV 97.9 102.8*  PLT 258 166   Cardiac Enzymes: No results for input(s): CKTOTAL, CKMB, CKMBINDEX, TROPONINI in the last 168 hours. BNP (last 3 results) No results for input(s): PROBNP in the last 8760 hours. CBG: No results for input(s): GLUCAP in the last 168 hours. D-Dimer: No results for input(s): DDIMER in the last 72 hours. Hgb A1c: No results for input(s): HGBA1C in the last 72 hours. Lipid Profile: No results for input(s): CHOL, HDL, LDLCALC, TRIG, CHOLHDL, LDLDIRECT in the last 72 hours. Thyroid function studies: No results for input(s): TSH, T4TOTAL, T3FREE, THYROIDAB in the last 72 hours.  Invalid input(s): FREET3 Anemia work up: No results for input(s): VITAMINB12, FOLATE, FERRITIN, TIBC, IRON, RETICCTPCT in the last 72 hours. Sepsis Labs: Recent Labs  Lab 11/28/21 1437 11/28/21 1800 11/28/21 1956 11/28/21 2210 11/29/21 0235  11/29/21 0636  WBC 15.5*  --   --   --  12.3*  --   LATICACIDVEN  --    < > 2.0* 4.3* 2.2* 1.9   < > = values in this interval not displayed.   Microbiology Recent Results (from the past 240 hour(s))  Culture, blood (Routine X 2) w Reflex to ID Panel     Status: None (Preliminary result)   Collection Time: 11/29/21  2:35 AM   Specimen: BLOOD  Result Value Ref Range Status   Specimen Description   Final    BLOOD BLOOD LEFT HAND Performed at [facility], [place] [address], [place], [place] [address]    Special Requests   Final    BOTTLES DRAWN AEROBIC AND ANAEROBIC Blood Culture adequate volume Performed at [facility] [address], [place], [place]  [address]    Culture   Final    NO GROWTH < 12 HOURS Performed at [facility] [address], [place], [place] [address]    Report Status PENDING  Incomplete  Culture, blood (Routine X 2) w Reflex to ID Panel     Status: None (Preliminary result)   Collection Time: 11/29/21  2:45 AM   Specimen: BLOOD  Result Value Ref Range Status   Specimen Description   Final    BLOOD BLOOD RIGHT HAND Performed at [facility] [address], [place], [place] [address]    Special Requests   Final    BOTTLES DRAWN AEROBIC ONLY Blood Culture adequate volume Performed at [facility] [address] [place], [place] [address]    Culture   Final    NO GROWTH < 12 HOURS Performed at [facility] [address], [place], [place] [address]    Report Status PENDING  Incomplete     Medications:    aspirin EC  81 mg Oral QODAY   carvedilol  6.25 mg Oral BID   enoxaparin (LOVENOX) injection  40 mg Subcutaneous Q24H   pantoprazole  40 mg Oral BID AC   sodium chloride flush  3 mL Intravenous Q12H   Continuous Infusions:  cefTRIAXone (ROCEPHIN)  IV        LOS: 1 day   Charlynne Cousins  [facility]  11/29/2021, 8:08 AM

## 2021-11-30 DIAGNOSIS — R9431 Abnormal electrocardiogram [ECG] [EKG]: Secondary | ICD-10-CM | POA: Diagnosis not present

## 2021-11-30 DIAGNOSIS — I10 Essential (primary) hypertension: Secondary | ICD-10-CM | POA: Diagnosis not present

## 2021-11-30 DIAGNOSIS — N3001 Acute cystitis with hematuria: Secondary | ICD-10-CM | POA: Diagnosis not present

## 2021-11-30 DIAGNOSIS — N3000 Acute cystitis without hematuria: Secondary | ICD-10-CM | POA: Diagnosis not present

## 2021-11-30 LAB — CBC
HCT: 32.3 % — ABNORMAL LOW (ref 36.0–46.0)
Hemoglobin: 10.4 g/dL — ABNORMAL LOW (ref 12.0–15.0)
MCH: 33.2 pg (ref 26.0–34.0)
MCHC: 32.2 g/dL (ref 30.0–36.0)
MCV: 103.2 fL — ABNORMAL HIGH (ref 80.0–100.0)
Platelets: 151 10*3/uL (ref 150–400)
RBC: 3.13 MIL/uL — ABNORMAL LOW (ref 3.87–5.11)
RDW: 14.1 % (ref 11.5–15.5)
WBC: 12.5 10*3/uL — ABNORMAL HIGH (ref 4.0–10.5)
nRBC: 0 % (ref 0.0–0.2)

## 2021-11-30 LAB — BASIC METABOLIC PANEL
Anion gap: 9 (ref 5–15)
BUN: 15 mg/dL (ref 8–23)
CO2: 21 mmol/L — ABNORMAL LOW (ref 22–32)
Calcium: 8.4 mg/dL — ABNORMAL LOW (ref 8.9–10.3)
Chloride: 107 mmol/L (ref 98–111)
Creatinine, Ser: 0.84 mg/dL (ref 0.44–1.00)
GFR, Estimated: >60 mL/min (ref >60)
Glucose, Bld: 111 mg/dL — ABNORMAL HIGH (ref 70–99)
Potassium: 4.3 mmol/L (ref 3.5–5.1)
Sodium: 137 mmol/L (ref 135–145)

## 2021-11-30 MED ORDER — POLYVINYL ALCOHOL 1.4 % OP SOLN: 1.0000 [drp] | Freq: Every day | OPHTHALMIC | Status: DC | PRN

## 2021-11-30 MED ORDER — SENNOSIDES-DOCUSATE SODIUM 8.6-50 MG PO TABS
1.0000 | ORAL_TABLET | Freq: Every day | ORAL | Status: DC
  Administered 2021-11-30: 1 via ORAL
  Filled 2021-11-30 (×2): qty 1

## 2021-11-30 MED ORDER — HYDROCHLOROTHIAZIDE 25 MG PO TABS: 25.0000 mg | ORAL_TABLET | Freq: Every day | ORAL | Status: DC | PRN

## 2021-11-30 MED ORDER — FAMOTIDINE 20 MG PO TABS: 40.0000 mg | ORAL_TABLET | Freq: Every day | ORAL | Status: DC | PRN

## 2021-11-30 MED ORDER — VITAMIN D 25 MCG (1000 UNIT) PO TABS
1000.0000 [IU] | ORAL_TABLET | Freq: Every day | ORAL | Status: DC
  Administered 2021-11-30 – 2021-12-03 (×4): 1000 [IU] via ORAL
  Filled 2021-11-30 (×4): qty 1

## 2021-11-30 MED ORDER — EZETIMIBE 10 MG PO TABS
10.0000 mg | ORAL_TABLET | Freq: Every day | ORAL | Status: DC
Start: 2021-11-30 — End: 2021-12-03
  Administered 2021-11-30 – 2021-12-02 (×3): 10 mg via ORAL
  Filled 2021-11-30 (×4): qty 1

## 2021-11-30 MED ORDER — SIMVASTATIN 40 MG PO TABS
80.0000 mg | ORAL_TABLET | Freq: Every day | ORAL | Status: DC
  Administered 2021-11-30 – 2021-12-02 (×3): 80 mg via ORAL
  Filled 2021-11-30 (×3): qty 2

## 2021-11-30 MED ORDER — POLYETHYLENE GLYCOL 3350 17 G PO PACK
17.0000 g | PACK | Freq: Every day | ORAL | Status: DC
  Administered 2021-11-30 – 2021-12-03 (×4): 17 g via ORAL
  Filled 2021-11-30 (×3): qty 1

## 2021-11-30 MED ORDER — EZETIMIBE-SIMVASTATIN 10-80 MG PO TABS: 1.0000 | ORAL_TABLET | Freq: Every day | ORAL | Status: DC

## 2021-11-30 NOTE — NC FL2 (Signed)
[place] LEVEL OF CARE SCREENING TOOL     IDENTIFICATION  Patient Name: Heather Garrett Birthdate: Sep 15, 1950 Sex: female Admission Date (Current Location): 11/28/2021  [facility] and [place] Number:  Herbalist and Address:  [facility],  [address] [place], [place]      Provider Number: [id number]  Attending Physician Name and Address:  Charlynne Cousins, MD  Relative Name and Phone Number:  [name], [name] [phone]  [phone]    Current Level of Care: Hospital Recommended Level of Care: [place] Prior Approval Number:    Date Approved/Denied:   PASRR Number: [id number]  Discharge Plan: SNF    Current Diagnoses: Patient Active Problem List   Diagnosis Date Noted   UTI (urinary tract infection) 11/28/2021   Prolonged QT interval 11/28/2021   Normocytic anemia 09/05/2021   Hyperkalemia 09/05/2021   Triggering of finger 05/17/2021   Heart disease 05/17/2021   Malignant tumor of breast ([place]) 05/17/2021   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[place]) 05/17/2021   Hypertensive disorder 05/17/2021   Lower extremity edema 05/14/2021   Benign paroxysmal positional vertigo of left ear 03/23/2021   Daytime somnolence 03/23/2021   History of sleep apnea 03/23/2021   Orthostatic hypotension 03/23/2021   Migraine without aura and without status migrainosus, not intractable 03/23/2021   Coronary arteriosclerosis in native artery 09/28/2016   Elevated LFTs 09/28/2016   Rotator cuff syndrome 09/28/2016   Biceps tendinitis 09/28/2016   Cobalamin deficiency 09/28/2016   Hyperlipidemia 09/28/2016   Migraine 09/28/2016   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[place]) 09/28/2016   Vitamin D deficiency 09/28/2016    Orientation RESPIRATION BLADDER Height & Weight     Self, Time, Situation, Place  O2 (2L) Incontinent Weight: 113 lb (51.3 kg) Height:  '4\' 8"'$  (142.2 cm)  BEHAVIORAL SYMPTOMS/MOOD NEUROLOGICAL  BOWEL NUTRITION STATUS      Incontinent Diet (Regular)  AMBULATORY STATUS COMMUNICATION OF NEEDS Skin   Limited Assist Verbally Normal                       Personal Care Assistance Level of Assistance  Bathing, Dressing, Feeding Bathing Assistance: Limited assistance Feeding assistance: Independent Dressing Assistance: Limited assistance     Functional Limitations Info  Sight, Hearing, Speech Sight Info: Adequate Hearing Info: Adequate Speech Info: Adequate    SPECIAL CARE FACTORS FREQUENCY                       Contractures Contractures Info: Not present    Additional Factors Info  Code Status, Allergies, Psychotropic Code Status Info: Full Code Allergies Info: Contrast Media (Iodinated Contrast Media)   Ciprofloxacin   Erythromycin Base   Sulfabenzamide Psychotropic Info: sertraline (ZOLOFT) tablet 100 mg         Current Medications (11/30/2021):  This is the current hospital active medication list Current Facility-Administered Medications  Medication Dose Route Frequency Provider Last Rate Last Admin   0.45 % sodium chloride infusion   Intravenous Continuous Charlynne Cousins, MD 10 mL/hr at 11/29/21 0908 New Bag at 11/29/21 0908   acetaminophen (TYLENOL) tablet 650 mg  650 mg Oral Q6H PRN Vianne Bulls, MD   650 mg at 11/30/21 2952   Or   acetaminophen (TYLENOL) suppository 650 mg  650 mg Rectal Q6H PRN Opyd, Ilene Qua, MD       aspirin EC tablet 81 mg  81 mg Oral QODAY Opyd, Ilene Qua,  MD   81 mg at 11/29/21 0905   carvedilol (COREG) tablet 6.25 mg  6.25 mg Oral BID Opyd, Ilene Qua, MD   6.25 mg at 11/30/21 1041   cefTRIAXone (ROCEPHIN) 2 g in sodium chloride 0.9 % 100 mL IVPB  2 g Intravenous Q24H Opyd, Ilene Qua, MD 200 mL/hr at 11/30/21 1719 2 g at 11/30/21 1719   cholecalciferol (VITAMIN D3) tablet 1,000 Units  1,000 Units Oral Daily Charlynne Cousins, MD   1,000 Units at 11/30/21 1041   enoxaparin (LOVENOX) injection 40 mg  40 mg Subcutaneous  Q24H Opyd, Timothy S, MD   40 mg at 11/30/21 1041   ezetimibe (ZETIA) tablet 10 mg  10 mg Oral QHS Charlynne Cousins, MD       And   simvastatin (ZOCOR) tablet 80 mg  80 mg Oral QHS Charlynne Cousins, MD       famotidine (PEPCID) tablet 40 mg  40 mg Oral Daily PRN Charlynne Cousins, MD       hydrochlorothiazide (HYDRODIURIL) tablet 25 mg  25 mg Oral Daily PRN Charlynne Cousins, MD       ibuprofen (ADVIL) tablet 400 mg  400 mg Oral Q4H PRN Opyd, Ilene Qua, MD       melatonin tablet 3 mg  3 mg Oral QHS Charlynne Cousins, MD   3 mg at 11/29/21 2229   oxyCODONE (Oxy IR/ROXICODONE) immediate release tablet 5 mg  5 mg Oral Q4H PRN Opyd, Ilene Qua, MD   5 mg at 11/30/21 1642   pantoprazole (PROTONIX) EC tablet 40 mg  40 mg Oral BID AC Opyd, Ilene Qua, MD   40 mg at 11/30/21 1720   polyethylene glycol (MIRALAX / GLYCOLAX) packet 17 g  17 g Oral Daily Charlynne Cousins, MD   17 g at 11/30/21 1630   polyvinyl alcohol (LIQUIFILM TEARS) 1.4 % ophthalmic solution 1 drop  1 drop Both Eyes Daily PRN Charlynne Cousins, MD       senna-docusate (Senokot-S) tablet 1 tablet  1 tablet Oral QHS PRN Opyd, Ilene Qua, MD       senna-docusate (Senokot-S) tablet 1 tablet  1 tablet Oral QHS Charlynne Cousins, MD       sertraline (ZOLOFT) tablet 100 mg  100 mg Oral Daily Aileen Fass, Tammi Klippel, MD   100 mg at 11/30/21 1041   sodium chloride flush (NS) 0.9 % injection 3 mL  3 mL Intravenous Q12H Opyd, Ilene Qua, MD   3 mL at 11/30/21 1045   tiZANidine (ZANAFLEX) tablet 4 mg  4 mg Oral QHS PRN Opyd, Ilene Qua, MD         Discharge Medications: Please see discharge summary for a list of discharge medications.  Relevant Imaging Results:  Relevant Lab Results:   Additional Information SSN [id number]  Ross Ludwig, LCSW

## 2021-11-30 NOTE — Evaluation (Signed)
Occupational Therapy Evaluation Patient Details Name: KALIANNE FETTING MRN: [id number] DOB: 06-15-1951 Today's Date: 11/30/2021   History of Present Illness 71 yo female admitted with UTI, fall at home, L LE pain/weakness. Hx of L THA for femoral neck fx 08/2021, L pubic rami fx 2/23, falls, osteopenia, bladder Ca, CAD, MI, OSA   Clinical Impression   Pt admitted with the above diagnosis and has the deficits listed below. Pt would benefit from cont OT to increase independence with LE ADLS and functional mobility so pt can eventually d/c home with her husband. Pt currently requires max assist to stand limiting ability for her to care for herself. Feel pt will need SNF if she cannot progress to min assist to stand.  Will continue to see acutely focusing on standing during adls and functional transfers.      Recommendations for follow up therapy are one component of a multi-disciplinary discharge planning process, led by the attending physician.  Recommendations may be updated based on patient status, additional functional criteria and insurance authorization.   Follow Up Recommendations  Skilled nursing-short term rehab (<3 hours/day)    Assistance Recommended at Discharge Frequent or constant Supervision/Assistance  Patient can return home with the following A lot of help with bathing/dressing/bathroom;Two people to help with walking and/or transfers;Assistance with cooking/housework;Assist for transportation;Help with stairs or ramp for entrance    Functional Status Assessment  Patient has had a recent decline in their functional status and demonstrates the ability to make significant improvements in function in a reasonable and predictable amount of time.  Equipment Recommendations  None recommended by OT    Recommendations for Other Services       Precautions / Restrictions Precautions Precautions: Fall Precaution Comments: L LE pain-pt wearing knee sleeve issued in  ED Restrictions Weight Bearing Restrictions: No LLE Weight Bearing: Weight bearing as tolerated      Mobility Bed Mobility Overal bed mobility: Needs Assistance Bed Mobility: Supine to Sit, Sit to Supine     Supine to sit: Mod assist, HOB elevated Sit to supine: Max assist, +2 for physical assistance (Pt felt dizzy so max assist x2 for safety.)   General bed mobility comments: Pt moved very well to EOB compared to session yesterday.  Pt tolerated EOB without feeling dizzy and scooted forward to EOB well. Pt became dizzy in standing.    Transfers Overall transfer level: Needs assistance Equipment used: Rolling walker (2 wheels) Transfers: Sit to/from Stand Sit to Stand: Mod assist, +2 physical assistance           General transfer comment: Pt stood with max assist x1 on one occasion and stood for appx 45 seconds before coming dizzy.  Pt required cues to tuck her bottom underneath her.      Balance Overall balance assessment: Needs assistance, History of Falls Sitting-balance support: Bilateral upper extremity supported, Feet supported Sitting balance-Leahy Scale: Good     Standing balance support: Bilateral upper extremity supported, Reliant on assistive device for balance, During functional activity Standing balance-Leahy Scale: Poor Standing balance comment: PT with posterior lean. Cues to tuck hips underneath her.                           ADL either performed or assessed with clinical judgement   ADL Overall ADL's : Needs assistance/impaired Eating/Feeding: Set up;Sitting   Grooming: Wash/dry hands;Oral care;Wash/dry face;Applying deodorant;Set up;Sitting   Upper Body Bathing: Set up;Sitting   Lower Body Bathing:  Maximal assistance;Sit to/from stand   Upper Body Dressing : Set up;Sitting   Lower Body Dressing: Maximal assistance;+2 for physical assistance;Sit to/from stand   Toilet Transfer: Maximal assistance;+2 for physical  assistance;Stand-pivot;BSC/3in1   Toileting- Clothing Manipulation and Hygiene: Total assistance;+2 for physical assistance;Sit to/from stand       Functional mobility during ADLs: Maximal assistance;+2 for physical assistance General ADL Comments: Pt limited today in standing due to feeling dizzy.  Pt has not stood in over a week and once on her feet started to feel very dizzy and after appx 1 minutes was returned to bed in supine.  This was pt's first time up in standing in a week and did well despite dizziness.     Vision Baseline Vision/History: 0 No visual deficits Ability to See in Adequate Light: 0 Adequate Patient Visual Report: No change from baseline Vision Assessment?: No apparent visual deficits     Perception     Praxis      Pertinent Vitals/Pain Pain Assessment Pain Assessment: Faces Faces Pain Scale: Hurts even more Pain Location: L LE Pain Descriptors / Indicators: Discomfort, Sore, Aching, Guarding, Grimacing Pain Intervention(s): Limited activity within patient's tolerance, Monitored during session, Premedicated before session, Repositioned     Hand Dominance Right   Extremity/Trunk Assessment Upper Extremity Assessment Upper Extremity Assessment: Overall WFL for tasks assessed   Lower Extremity Assessment Lower Extremity Assessment: Defer to PT evaluation   Cervical / Trunk Assessment Cervical / Trunk Assessment: Kyphotic   Communication Communication Communication: No difficulties   Cognition Arousal/Alertness: Awake/alert Behavior During Therapy: WFL for tasks assessed/performed Overall Cognitive Status: Within Functional Limits for tasks assessed                                       General Comments  Pt most limited by dizziness in standing. Pt very willing to work hard and mobilize. Pain limits pt's ability to pivot to chair but pt excited to stand for first time in one week.    Exercises     Shoulder Instructions       Home Living Family/patient expects to be discharged to:: Private residence Living Arrangements: Spouse/significant other Available Help at Discharge: Family Type of Home: House Home Access: Stairs to enter;Other (comment) (garage is flat entrance)     Home Layout: One level     Bathroom Shower/Tub: Walk-in shower;Door;Other (comment) (takes walker into shower with her)   Bathroom Toilet: Standard     Home Equipment: Conservation officer, nature (2 wheels);Rollator (4 wheels);Wheelchair - manual;BSC/3in1   Additional Comments: Pt had difficulty getting off 3:1 at home just before admission.  Need to evaluate why pt was having issues with this.      Prior Functioning/Environment Prior Level of Function : Needs assist       Physical Assist : ADLs (physical)   ADLs (physical): Dressing Mobility Comments: Pt walked with walker PTA since hip surgery in 08/2021 ADLs Comments: Pt's husband assists with dressing LE incluing pants and depends as well as socks and shoes since hip surgery.        OT Problem List: Decreased strength;Decreased activity tolerance;Impaired balance (sitting and/or standing);Decreased knowledge of use of DME or AE;Pain      OT Treatment/Interventions: Self-care/ADL training;Therapeutic activities;Balance training    OT Goals(Current goals can be found in the care plan section) Acute Rehab OT Goals Patient Stated Goal: to get rid of this knee  pain OT Goal Formulation: With patient Time For Goal Achievement: 12/14/21 Potential to Achieve Goals: Good ADL Goals Pt Will Perform Grooming: with supervision;standing Pt Will Perform Lower Body Bathing: with supervision;with adaptive equipment;sit to/from stand Pt Will Perform Lower Body Dressing: with min assist;with adaptive equipment;sit to/from stand Pt Will Perform Tub/Shower Transfer: Shower transfer;ambulating;rolling walker;grab bars;with min assist Additional ADL Goal #1: Pt will tolerate standing EOB for 2  mintues with walker in prep for adls standing at EOB  OT Frequency: Min 2X/week    Co-evaluation              AM-PAC OT "6 Clicks" Daily Activity     Outcome Measure Help from another person eating meals?: None Help from another person taking care of personal grooming?: None Help from another person toileting, which includes using toliet, bedpan, or urinal?: A Lot Help from another person bathing (including washing, rinsing, drying)?: A Lot Help from another person to put on and taking off regular upper body clothing?: A Little Help from another person to put on and taking off regular lower body clothing?: A Lot 6 Click Score: 17   End of Session Equipment Utilized During Treatment: Rolling walker (2 wheels);Oxygen Nurse Communication: Mobility status  Activity Tolerance: Patient limited by fatigue Patient left: in bed;with call bell/phone within reach;with bed alarm set  OT Visit Diagnosis: Other abnormalities of gait and mobility (R26.89);Unsteadiness on feet (R26.81)                Time: 6333-5456 OT Time Calculation (min): 28 min Charges:  OT General Charges $OT Visit: 1 Visit OT Evaluation $OT Eval Moderate Complexity: 1 Mod OT Treatments $Self Care/Home Management : 8-22 mins  Glenford Peers 11/30/2021, 10:27 AM

## 2021-11-30 NOTE — Progress Notes (Signed)
[facility] PROGRESS NOTE    Progress Note  Heather Garrett  TDD:[id number] DOB: [birth year] DOA: 11/28/2021 PCP: Merryl Hacker, No     Brief Narrative:   Heather Garrett is an 71 y.o. female past medical history significant for coronary artery disease, bladder cancer in remission essential hypertension obstructive sleep apnea not on CPAP history of total left hip arthroplasty comes into the emergency room with left knee pain after a fall she also relates some dysuria and low back pain, frequent spells of lightheadedness upon standing in the ED was found to be tachycardic blood pressure stable white count of 15 lactic acid of 2.3 positive nitrates and greater than 50 white blood cells in urine CT scan of the head and cervical C-spine showed no acute findings.   Assessment/Plan:   UTI (urinary tract infection) Empirically on Rocephin does not appear septic. Tmax of 99. Urine cultures and blood cultures are negative till date. Evaluated the patient, will need skilled nursing facility.  Left knee pain: X-ray of the knee showed no fracture, small joint effusion and mild degenerative disease and osteopenia. Out of bed to chair continue analgesics. We will need skilled nursing facility.  CAD: Asymptomatic continue aspirin and beta-blockers.  Essential hypertension: Blood pressures fairly controlled, resume all of her antihypertensive medication.  Prolonged QTc: Try to keep potassium greater than 4 magnesium greater than 2.  History of bladder cancer: In remission follow-up with urology as an outpatient.     DVT prophylaxis: lovenox Family Communication:Husband Status is: Inpatient Remains inpatient appropriate because: UTI causing lightheadedness.    Code Status:     Code Status Orders  (From admission, onward)           Start     Ordered   11/28/21 2151  Full code  Continuous        11/28/21 2154           Code Status History     Date Active Date  Inactive Code Status Order ID Comments User Context   09/05/2021 1305 09/10/2021 1715 Full Code [id number]  Reubin Milan, MD Inpatient   09/05/2021 1305 09/05/2021 1305 Full Code [id number]  Reubin Milan, MD Inpatient         IV Access:   Peripheral IV   Procedures and diagnostic studies:   DG Tibia/Fibula Left  Result Date: 11/28/2021 CLINICAL DATA:  Recent fall, knee pain EXAM: LEFT TIBIA AND FIBULA - 2 VIEW COMPARISON:  11/28/2021 FINDINGS: Bones are osteopenic. No acute osseous finding, displaced fracture or malalignment. Peripheral atherosclerosis noted. IMPRESSION: Osteopenia.  No acute finding by plain radiography Peripheral atherosclerosis Electronically Signed   By: Jerilynn Mages.  Shick M.D.   On: 11/28/2021 15:27   CT Head Wo Contrast  Result Date: 11/28/2021 CLINICAL DATA:  Head trauma, minor (Age >= 65y); Neck trauma (Age >= 65y) EXAM: CT HEAD WITHOUT CONTRAST CT CERVICAL SPINE WITHOUT CONTRAST TECHNIQUE: Multidetector CT imaging of the head and cervical spine was performed following the standard protocol without intravenous contrast. Multiplanar CT image reconstructions of the cervical spine were also generated. RADIATION DOSE REDUCTION: This exam was performed according to the departmental dose-optimization program which includes automated exposure control, adjustment of the mA and/or kV according to patient size and/or use of iterative reconstruction technique. COMPARISON:  MRI 01/18/2021 FINDINGS: CT HEAD FINDINGS Brain: No evidence of acute infarction, hemorrhage, hydrocephalus, extra-axial collection or mass lesion/mass effect. Patchy low-density changes within the periventricular and subcortical white matter compatible with chronic microvascular ischemic change.  Mild-to-moderate diffuse cerebral volume loss. Vascular: No hyperdense vessel or unexpected calcification. Skull: Normal. Negative for fracture or focal lesion. Sinuses/Orbits: No acute finding. Other: None. CT CERVICAL  SPINE FINDINGS Alignment: Facet joints are aligned without dislocation or traumatic listhesis. Dens and lateral masses are aligned. Mild grade 1 anterolisthesis of C3 on C4 and C4 on C5 mediated by degenerative facet arthropathy. Skull base and vertebrae: Mild superior endplate compression deformity of T2 with mild sclerosis at the superior endplate suggesting a subacute or chronic process. No evidence of an acute fracture of the cervical spine. No suspicious lytic or sclerotic bone lesion. Soft tissues and spinal canal: No prevertebral fluid or swelling. No visible canal hematoma. Disc levels: Intervertebral disc heights are preserved. Degenerative facet arthropathy within the upper cervical spine. Upper chest: Negative. Other: None. IMPRESSION: 1. No acute intracranial abnormality. 2. No acute cervical spine fracture. 3. Mild superior endplate compression deformity of T2 with mild sclerosis at the superior endplate suggesting a subacute or chronic process. Correlate with point tenderness. 4. Chronic microvascular ischemic change and cerebral volume loss. Electronically Signed   By: Davina Poke D.O.   On: 11/28/2021 15:28   CT Cervical Spine Wo Contrast  Result Date: 11/28/2021 CLINICAL DATA:  Head trauma, minor (Age >= 65y); Neck trauma (Age >= 65y) EXAM: CT HEAD WITHOUT CONTRAST CT CERVICAL SPINE WITHOUT CONTRAST TECHNIQUE: Multidetector CT imaging of the head and cervical spine was performed following the standard protocol without intravenous contrast. Multiplanar CT image reconstructions of the cervical spine were also generated. RADIATION DOSE REDUCTION: This exam was performed according to the departmental dose-optimization program which includes automated exposure control, adjustment of the mA and/or kV according to patient size and/or use of iterative reconstruction technique. COMPARISON:  MRI 01/18/2021 FINDINGS: CT HEAD FINDINGS Brain: No evidence of acute infarction, hemorrhage, hydrocephalus,  extra-axial collection or mass lesion/mass effect. Patchy low-density changes within the periventricular and subcortical white matter compatible with chronic microvascular ischemic change. Mild-to-moderate diffuse cerebral volume loss. Vascular: No hyperdense vessel or unexpected calcification. Skull: Normal. Negative for fracture or focal lesion. Sinuses/Orbits: No acute finding. Other: None. CT CERVICAL SPINE FINDINGS Alignment: Facet joints are aligned without dislocation or traumatic listhesis. Dens and lateral masses are aligned. Mild grade 1 anterolisthesis of C3 on C4 and C4 on C5 mediated by degenerative facet arthropathy. Skull base and vertebrae: Mild superior endplate compression deformity of T2 with mild sclerosis at the superior endplate suggesting a subacute or chronic process. No evidence of an acute fracture of the cervical spine. No suspicious lytic or sclerotic bone lesion. Soft tissues and spinal canal: No prevertebral fluid or swelling. No visible canal hematoma. Disc levels: Intervertebral disc heights are preserved. Degenerative facet arthropathy within the upper cervical spine. Upper chest: Negative. Other: None. IMPRESSION: 1. No acute intracranial abnormality. 2. No acute cervical spine fracture. 3. Mild superior endplate compression deformity of T2 with mild sclerosis at the superior endplate suggesting a subacute or chronic process. Correlate with point tenderness. 4. Chronic microvascular ischemic change and cerebral volume loss. Electronically Signed   By: Davina Poke D.O.   On: 11/28/2021 15:28   DG Knee Complete 4 Views Left  Result Date: 11/28/2021 CLINICAL DATA:  Fall. EXAM: LEFT KNEE - COMPLETE 4+ VIEW COMPARISON:  None Available. FINDINGS: The bones are diffusely osteopenic. Small joint effusion present. Vascular calcifications are seen in the soft tissues. There is no acute fracture or dislocation identified. There is patellofemoral compartment joint space narrowing.  Alignment is anatomic. IMPRESSION:  1. Small joint effusion. 2. No acute bony abnormality. 3. Mild degenerative changes. 4. Diffuse osteopenia. Electronically Signed   By: Ronney Asters M.D.   On: 11/28/2021 15:28     Medical Consultants:   None.   Subjective:    Heather Garrett no complaints agreed to go to skilled nursing facility.  Objective:    Vitals:   11/29/21 1534 11/29/21 2041 11/29/21 2228 11/30/21 0453  BP: (!) 138/97 (!) 146/94 (!) 138/102 (!) 143/93  Pulse: 95 96 94 87  Resp: '20 19  17  '$ Temp: 98.7 F (37.1 C) 99.7 F (37.6 C)  98.9 F (37.2 C)  TempSrc:    Oral  SpO2: 93% 94%  97%  Weight:      Height:       SpO2: 97 % O2 Flow Rate (L/min): 2 L/min   Intake/Output Summary (Last 24 hours) at 11/30/2021 0803 Last data filed at 11/29/2021 1600 Gross per 24 hour  Intake 422.61 ml  Output 0 ml  Net 422.61 ml    Filed Weights   11/28/21 1915  Weight: 51.3 kg    Exam: General exam: In no acute distress. Respiratory system: Good air movement and clear to auscultation. Cardiovascular system: S1 & S2 heard, RRR. No JVD. Gastrointestinal system: Abdomen is nondistended, soft and nontender.  Central nervous system: Alert and oriented. No focal neurological deficits. Extremities: No pedal edema. Skin: No rashes, lesions or ulcers Psychiatry: Judgement and insight appear normal. Mood & affect appropriate.   Data Reviewed:    Labs: Basic Metabolic Panel: Recent Labs  Lab 11/28/21 1437 11/28/21 2210 11/29/21 0235 11/30/21 0438  NA 140  --  143 137  K 4.8  --  4.6 4.3  CL 107  --  113* 107  CO2 21*  --  20* 21*  GLUCOSE 149*  --  144* 111*  BUN 12  --  12 15  CREATININE 0.75  --  0.68 0.84  CALCIUM 8.8*  --  8.4* 8.4*  MG  --  2.0  --   --     GFR Estimated Creatinine Clearance: 41 mL/min (by C-G formula based on SCr of 0.84 mg/dL). Liver Function Tests: No results for input(s): AST, ALT, ALKPHOS, BILITOT, PROT, ALBUMIN in the last 168  hours. No results for input(s): LIPASE, AMYLASE in the last 168 hours. No results for input(s): AMMONIA in the last 168 hours. Coagulation profile No results for input(s): INR, PROTIME in the last 168 hours. COVID-19 Labs  No results for input(s): DDIMER, FERRITIN, LDH, CRP in the last 72 hours.  Lab Results  Component Value Date   [place] NEGATIVE 09/05/2021    CBC: Recent Labs  Lab 11/28/21 1437 11/29/21 0235 11/30/21 0438  WBC 15.5* 12.3* 12.5*  NEUTROABS 11.2*  --   --   HGB 12.1 10.8* 10.4*  HCT 37.5 33.2* 32.3*  MCV 97.9 102.8* 103.2*  PLT 258 166 151    Cardiac Enzymes: No results for input(s): CKTOTAL, CKMB, CKMBINDEX, TROPONINI in the last 168 hours. BNP (last 3 results) No results for input(s): PROBNP in the last 8760 hours. CBG: No results for input(s): GLUCAP in the last 168 hours. D-Dimer: No results for input(s): DDIMER in the last 72 hours. Hgb A1c: No results for input(s): HGBA1C in the last 72 hours. Lipid Profile: No results for input(s): CHOL, HDL, LDLCALC, TRIG, CHOLHDL, LDLDIRECT in the last 72 hours. Thyroid function studies: No results for input(s): TSH, T4TOTAL, T3FREE, THYROIDAB in the last  72 hours.  Invalid input(s): FREET3 Anemia work up: No results for input(s): VITAMINB12, FOLATE, FERRITIN, TIBC, IRON, RETICCTPCT in the last 72 hours. Sepsis Labs: Recent Labs  Lab 11/28/21 1437 11/28/21 1800 11/28/21 1956 11/28/21 2210 11/29/21 0235 11/29/21 0636 11/30/21 0438  WBC 15.5*  --   --   --  12.3*  --  12.5*  LATICACIDVEN  --    < > 2.0* 4.3* 2.2* 1.9  --    < > = values in this interval not displayed.    Microbiology Recent Results (from the past 240 hour(s))  Culture, blood (Routine X 2) w Reflex to ID Panel     Status: None (Preliminary result)   Collection Time: 11/29/21  2:35 AM   Specimen: BLOOD  Result Value Ref Range Status   Specimen Description   Final    BLOOD BLOOD LEFT HAND Performed at [facility], [place] [address] [place], [place] [address]    Special Requests   Final    BOTTLES DRAWN AEROBIC AND ANAEROBIC Blood Culture adequate volume Performed at [facility] [address] [place], [place] [address]    Culture   Final    NO GROWTH < 12 HOURS Performed at [facility] [address], [place], [place] [address]    Report Status PENDING  Incomplete  Culture, blood (Routine X 2) w Reflex to ID Panel     Status: None (Preliminary result)   Collection Time: 11/29/21  2:45 AM   Specimen: BLOOD  Result Value Ref Range Status   Specimen Description   Final    BLOOD BLOOD RIGHT HAND Performed at [facility] [address] [place], [place] [address]    Special Requests   Final    BOTTLES DRAWN AEROBIC ONLY Blood Culture adequate volume Performed at [facility] [address] [place], [place] [address]    Culture   Final    NO GROWTH < 12 HOURS Performed at [facility] [address], [place], [place] [address]    Report Status PENDING  Incomplete     Medications:    aspirin EC  81 mg Oral QODAY   carvedilol  6.25 mg Oral BID   enoxaparin (LOVENOX) injection  40 mg Subcutaneous Q24H   melatonin  3 mg Oral QHS   pantoprazole  40 mg Oral BID AC   sertraline  100 mg Oral Daily   sodium chloride flush  3 mL Intravenous Q12H   Continuous Infusions:  sodium chloride 10 mL/hr at 11/29/21 0908   cefTRIAXone (ROCEPHIN)  IV 2 g (11/29/21 1748)      LOS: 2 days   Charlynne Cousins  [facility]  11/30/2021, 8:03 AM

## 2021-11-30 NOTE — TOC Transition Note (Signed)
Transition of Care [facility]) - CM/SW Discharge Note   Patient Details  Name: AZLIN ZILBERMAN MRN: [id number] Date of Birth: 05/05/1951  Transition [facility] Care [facility]) CM/SW Contact:  Ross Ludwig, LCSW Phone Number: 11/30/2021, 6:10 PM   Clinical Narrative:     Patient is a 71 year old female who is alert and oriented x4. Patient is married lives with husband, [place].  [place] stated she did not have a good experience at the last facility, she is willing to try again here in [place].  Patient was explained the process and how insurance will pay for stay at SNF.  Patient gave CSW permission to begin bed search in [facility].  CSW awaiting for bed offers, and will need to get insurance authorization before she is able to go to SNF.   Final next level of care: Skilled Nursing Facility Barriers to Discharge: Ship broker, Continued Medical Work up   Patient Goals and CMS Choice Patient states their goals for this hospitalization and ongoing recovery are:: Patient plans to go to SNF then go home. CMS Medicare.gov Compare Post Acute Care list provided to:: Patient Choice offered to / list presented to : Patient  Discharge Placement                       Discharge Plan and Services     Post Acute Care Choice: Skilled Nursing Facility                               Social Determinants of Health (SDOH) Interventions     Readmission Risk Interventions     View : No data to display.

## 2021-12-01 ENCOUNTER — Inpatient Hospital Stay (HOSPITAL_COMMUNITY): Payer: Medicare PPO

## 2021-12-01 DIAGNOSIS — R627 Adult failure to thrive: Secondary | ICD-10-CM | POA: Diagnosis not present

## 2021-12-01 DIAGNOSIS — J9601 Acute respiratory failure with hypoxia: Secondary | ICD-10-CM | POA: Diagnosis not present

## 2021-12-01 DIAGNOSIS — N390 Urinary tract infection, site not specified: Secondary | ICD-10-CM

## 2021-12-01 DIAGNOSIS — R319 Hematuria, unspecified: Secondary | ICD-10-CM | POA: Diagnosis not present

## 2021-12-01 DIAGNOSIS — R609 Edema, unspecified: Secondary | ICD-10-CM | POA: Diagnosis not present

## 2021-12-01 LAB — URINE CULTURE: Culture: >=100000 — AB

## 2021-12-01 LAB — BASIC METABOLIC PANEL
Anion gap: 8 (ref 5–15)
BUN: 14 mg/dL (ref 8–23)
CO2: 22 mmol/L (ref 22–32)
Calcium: 8.7 mg/dL — ABNORMAL LOW (ref 8.9–10.3)
Chloride: 106 mmol/L (ref 98–111)
Creatinine, Ser: 0.74 mg/dL (ref 0.44–1.00)
GFR, Estimated: >60 mL/min (ref >60)
Glucose, Bld: 120 mg/dL — ABNORMAL HIGH (ref 70–99)
Potassium: 4 mmol/L (ref 3.5–5.1)
Sodium: 136 mmol/L (ref 135–145)

## 2021-12-01 LAB — CBC
HCT: 32.5 % — ABNORMAL LOW (ref 36.0–46.0)
Hemoglobin: 10.3 g/dL — ABNORMAL LOW (ref 12.0–15.0)
MCH: 33 pg (ref 26.0–34.0)
MCHC: 31.7 g/dL (ref 30.0–36.0)
MCV: 104.2 fL — ABNORMAL HIGH (ref 80.0–100.0)
Platelets: 166 10*3/uL (ref 150–400)
RBC: 3.12 MIL/uL — ABNORMAL LOW (ref 3.87–5.11)
RDW: 14.4 % (ref 11.5–15.5)
WBC: 10.4 10*3/uL (ref 4.0–10.5)
nRBC: 0 % (ref 0.0–0.2)

## 2021-12-01 IMAGING — DX DG CHEST 1V PORT
1 series · 1 of 1 positions shown · non-contrast
Comparison: Chest radiograph [DATE].

CLINICAL DATA: Provided history: Hypoxia.

EXAM:
PORTABLE CHEST 1 VIEW

[chest ap]
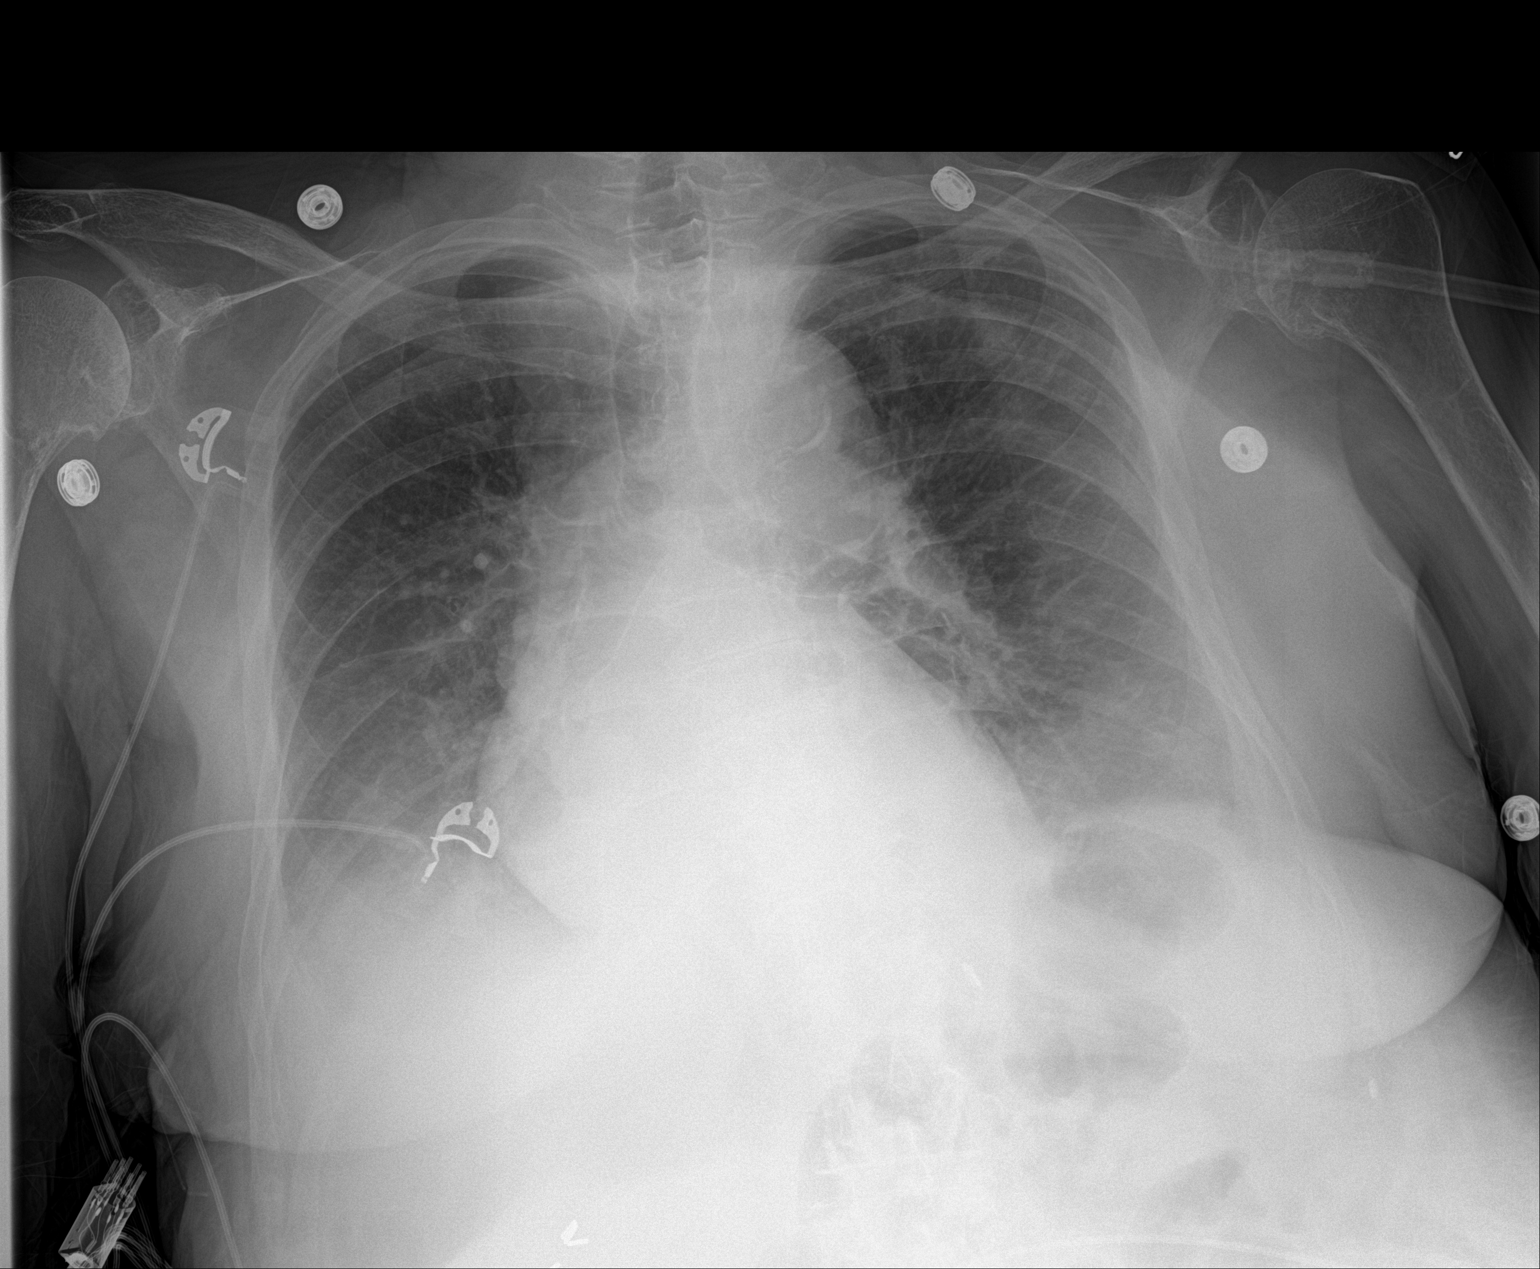

[1 of 1 positions shown; findings below may reference images not displayed]

FINDINGS: Mild cardiomegaly. Aortic atherosclerosis. Small right pleural
effusion. Additional ill-defined opacity at the right lung base
which may reflect atelectasis and/or consolidation. Minimal left
basilar atelectasis. No evidence of pneumothorax. No acute bony
abnormality identified.
IMPRESSION: Cardiomegaly.

Small right pleural effusion.

Additional ill-defined opacity at the right lung base, which may
reflect atelectasis and/or consolidation.

Minimal left basilar atelectasis.

Aortic Atherosclerosis ([SY]-[SY]).

## 2021-12-01 MED ORDER — VITAMIN B-12 1000 MCG PO TABS
1000.0000 ug | ORAL_TABLET | Freq: Every day | ORAL | Status: DC
  Administered 2021-12-02 – 2021-12-03 (×2): 1000 ug via ORAL
  Filled 2021-12-01 (×2): qty 1

## 2021-12-01 MED ORDER — BISACODYL 10 MG RE SUPP
10.0000 mg | Freq: Every day | RECTAL | Status: AC
  Administered 2021-12-01: 10 mg via RECTAL
  Filled 2021-12-01: qty 1

## 2021-12-01 MED ORDER — PHENOL 1.4 % MT LIQD
1.0000 | OROMUCOSAL | Status: DC | PRN
  Filled 2021-12-01: qty 177

## 2021-12-01 MED ORDER — SENNOSIDES-DOCUSATE SODIUM 8.6-50 MG PO TABS
1.0000 | ORAL_TABLET | Freq: Two times a day (BID) | ORAL | Status: DC
  Administered 2021-12-01 – 2021-12-03 (×4): 1 via ORAL
  Filled 2021-12-01 (×4): qty 1

## 2021-12-01 MED ORDER — CEPHALEXIN 500 MG PO CAPS
500.0000 mg | ORAL_CAPSULE | Freq: Two times a day (BID) | ORAL | Status: DC
  Administered 2021-12-01 – 2021-12-03 (×4): 500 mg via ORAL
  Filled 2021-12-01 (×4): qty 1

## 2021-12-01 NOTE — Plan of Care (Signed)

## 2021-12-01 NOTE — Progress Notes (Signed)
Left lower extremity venous duplex has been completed. Preliminary results can be found in CV Proc through chart review.   12/01/21 12:03 PM Heather Garrett RVT

## 2021-12-01 NOTE — TOC Progression Note (Signed)
Transition of Care [facility]) - Progression Note    Patient Details  Name: Heather Garrett MRN: [id number] Date of Birth: 10-05-1950  Transition of Care [facility]) CM/SW [place], [place] Phone Number: 12/01/2021, 10:52 AM  Clinical Narrative:     Provided bed offers to pt; she chose [facility].  CSW confirmed with [facility] they can accept pt.   Insurance auth submitted in [place]; auth pending.    Expected Discharge Plan: Skilled Nursing Facility Barriers to Discharge: Ship broker, Continued Medical Work up  Expected Discharge Plan and Services Expected Discharge Plan: [place] Choice: [facility] arrangements for the past 2 months: Single Family Home                                       Social Determinants of Health (SDOH) Interventions    Readmission Risk Interventions     View : No data to display.

## 2021-12-01 NOTE — Progress Notes (Signed)
Physical Therapy Treatment Patient Details Name: Heather Garrett MRN: [id number] DOB: Mar 14, 1951 Today's Date: 12/01/2021   History of Present Illness 71 yo female admitted with UTI, fall at home, L LE pain/weakness. Hx of L THA for femoral neck fx 08/2021, L pubic rami fx 2/23, falls, osteopenia, bladder Ca, CAD, MI, OSA    PT Comments    Progressing with mobility. Still experiencing moderate pain in L LE-pt rated 7/10 with activity on today. O2 sat readings: 92% on RA at rest, 84% on RA with activity (bed mobility), replaced [place] O2 for remainder of session. Encouraged pt to sit up in recliner as tolerated on today. Continue to recommend ST SNF rehab.    Recommendations for follow up therapy are one component of a multi-disciplinary discharge planning process, led by the attending physician.  Recommendations may be updated based on patient status, additional functional criteria and insurance authorization.  Follow Up Recommendations  Skilled nursing-short term rehab (<3 hours/day)     Assistance Recommended at Discharge Frequent or constant Supervision/Assistance  Patient can return home with the following Two people to help with walking and/or transfers;Two people to help with bathing/dressing/bathroom;Assistance with cooking/housework;Assist for transportation;Help with stairs or ramp for entrance   Equipment Recommendations  None recommended by PT    Recommendations for Other Services       Precautions / Restrictions Precautions Precautions: Fall Precaution Comments: L LE pain-pt wearing knee sleeve issued in ED Restrictions Weight Bearing Restrictions: No LLE Weight Bearing: Weight bearing as tolerated     Mobility  Bed Mobility Overal bed mobility: Needs Assistance Bed Mobility: Supine to Sit     Supine to sit: Mod assist, HOB elevated     General bed mobility comments: Assist for trunk and L LE. Increased time.Cues for safety, technique. Pt reports dizziness  while sitting EOB-resolved enough to attempt standing    Transfers Overall transfer level: Needs assistance   Transfers: Sit to/from Stand, Bed to chair/wheelchair/BSC Sit to Stand: Min assist, +2 physical assistance, +2 safety/equipment           General transfer comment: Used STEDY for safety to work on standing tolerance and pre-gait tasks. Pt stood x 3-at the longest for 1 minute. She then worked on Electronics engineer and marching. She still has difficulty with WBing on L LE but she was able to briefly clear R foot from surface while marching. Transferred pt to recliner using STEDY. Fatigues fairly easily still.  Transfer via Lift Equipment: Stedy  Ambulation/Gait               General Gait Details: NT-not quite ready but progressing.   Stairs             Wheelchair Mobility    Modified Rankin (Stroke Patients Only)       Balance Overall balance assessment: Needs assistance, History of Falls         Standing balance support: Bilateral upper extremity supported, Reliant on assistive device for balance, During functional activity Standing balance-Leahy Scale: Poor                              Cognition Arousal/Alertness: Awake/alert Behavior During Therapy: WFL for tasks assessed/performed Overall Cognitive Status: Within Functional Limits for tasks assessed  Exercises      General Comments        Pertinent Vitals/Pain Pain Assessment Pain Assessment: 0-10 Pain Score: 7  Pain Location: L LE Pain Descriptors / Indicators: Discomfort, Sore, Aching, Guarding, Grimacing Pain Intervention(s): Limited activity within patient's tolerance, Monitored during session, Repositioned    Home Living                          Prior Function            PT Goals (current goals can now be found in the care plan section) Progress towards PT goals: Progressing toward  goals    Frequency    Min 2X/week      PT Plan Current plan remains appropriate    Co-evaluation              AM-PAC PT "6 Clicks" Mobility   Outcome Measure  Help needed turning from your back to your side while in a flat bed without using bedrails?: A Little Help needed moving from lying on your back to sitting on the side of a flat bed without using bedrails?: A Lot Help needed moving to and from a bed to a chair (including a wheelchair)?: A Lot Help needed standing up from a chair using your arms (e.g., wheelchair or bedside chair)?: A Lot Help needed to walk in hospital room?: Total Help needed climbing 3-5 steps with a railing? : Total 6 Click Score: 11    End of Session Equipment Utilized During Treatment: Gait belt Activity Tolerance: Patient limited by pain;Patient limited by fatigue Patient left: in chair;with call bell/phone within reach   PT Visit Diagnosis: Pain;History of falling (Z91.81);Difficulty in walking, not elsewhere classified (R26.2);Muscle weakness (generalized) (M62.81);Other abnormalities of gait and mobility (R26.89) Pain - Right/Left: Left Pain - part of body: Leg;Ankle and joints of foot     Time: 9201-0071 PT Time Calculation (min) (ACUTE ONLY): 20 min  Charges:  $Gait Training: 8-22 mins                         Doreatha Massed, PT Acute Rehabilitation  Office: [phone] Pager: [phone]

## 2021-12-01 NOTE — Progress Notes (Addendum)
PROGRESS NOTE    Heather Garrett  QPY:[id number] DOB: [birth year] DOA: 11/28/2021 PCP: Pcp, No     Brief Narrative:    coronary artery disease, bladder cancer in remission per patient report, hypertension, OSA not on CPAP, and left hip fracture in February status post total hip arthroplasty, now presenting to the emergency department with left knee pain after fall.  Patient reports a few days of dysuria and low back pain, but had been going about her usual activities and was ambulating with a walker in her home when she fell and developed left knee pain, left knee x ray no fracture Found to have uti  Subjective:  Oxygen dropped to 83% on room air while working with PT Left leg edema Left knee pain No bm, would like to have suppository  Assessment & Plan:  Principal Problem:   UTI (urinary tract infection) Active Problems:   Coronary arteriosclerosis in native artery   Hypertensive disorder   Prolonged QT interval    Assessment and Plan:  Ecoli UTI, POA Urine culture positive for E. 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/hyperlipidemia /chronic d CHF Last lveff from 09/2020 was 12-45%, Grade 1 diastolic dysfunction Continue Coreg , statins, asa Close monitor volume status    essential hypertension: Bp stable on Coreg to 6.25 bid,     chronic anemia/macrocytosis (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   Constipation Reports not resolving needs Senokot and MiraLAX, will give suppository   H/o bladder cancer in remission  H/o breast cancer /breast surgery, in remission    FTT, seen by PT recommend snf placement        I have Reviewed nursing notes, Vitals, pain scores, I/o's, Lab results and  imaging results since pt's last encounter, details please see discussion above  I ordered the following labs:  Unresulted Labs (From admission, onward)     Start     Ordered   12/05/21 0500  Creatinine, serum  (enoxaparin (LOVENOX)    CrCl >/= 30 ml/min)  Weekly,   R     Comments: while on enoxaparin therapy    11/28/21 2154   12/02/21 0500  Procalcitonin  Daily,   R      12/01/21 1418   12/02/21 0500  TSH  Tomorrow morning,   R        12/01/21 1739   12/02/21 0500  CBC with Differential/Platelet  Tomorrow morning,   R        12/01/21 1746   12/02/21 0500  Comprehensive metabolic panel  Tomorrow morning,   R        12/01/21 1746             DVT prophylaxis:  enoxaparin (LOVENOX) injection 40 mg Start: 11/29/21 1000   Code Status:   Code Status: Full Code  Family Communication: patient Disposition:   Status is: Inpatient   Dispo: The patient is from: home              Anticipated d/c is to: SNF              Anticipated d/c date is: need to wean oxygen, f/u on cxr and venous doppler,  Antimicrobials:    Anti-infectives (From admission, onward)    Start     Dose/Rate Route Frequency Ordered Stop   12/01/21 2200  cephALEXin (KEFLEX) capsule 500 mg        500 mg Oral Every 12 hours 12/01/21 1607 12/05/21 2159   11/29/21 1800  cefTRIAXone (ROCEPHIN) 2 g in sodium chloride 0.9 % 100 mL IVPB  Status:  Discontinued        2 g 200 mL/hr over 30 Minutes Intravenous Every 24  hours 11/28/21 2154 12/01/21 1607   11/28/21 2345  cefTRIAXone (ROCEPHIN) 1 g in sodium chloride 0.9 % 100 mL IVPB        1 g 200 mL/hr over 30 Minutes Intravenous  Once 11/28/21 2258 11/29/21 0213   11/28/21 1800  cefTRIAXone (ROCEPHIN) 1 g in sodium chloride 0.9 % 100 mL IVPB        1 g 200 mL/hr over 30 Minutes Intravenous  Once 11/28/21 1749 11/28/21 1840          Objective: Vitals:   11/30/21 1922 12/01/21 0332 12/01/21 1018 12/01/21 1331  BP: 112/82 (!) 121/91 (!) 148/91 104/71  Pulse: 84 68 82 73  Resp: '15 15  18  '$ Temp: 99.1 F (37.3 C) 98.2 F (36.8 C)  98.2 F (36.8 C)  TempSrc: Oral Oral  Oral  SpO2: 95% 97% 97% 98%  Weight:      Height:        Intake/Output Summary (Last 24 hours) at 12/01/2021 1747 Last data filed at 12/01/2021 0855 Gross per 24 hour  Intake 683.91 ml  Output --  Net 683.91 ml   Filed Weights   11/28/21 1915  Weight: 51.3 kg    Examination:  General exam: appear weak, aaox3 Respiratory system: . Diminished, no wheezing,no rales, no rhonchi, Respiratory effort normal. Cardiovascular system:  RRR.  Gastrointestinal system: Abdomen is nondistended, soft and nontender.  Normal bowel sounds heard. Central nervous system: Alert and oriented. No focal neurological deficits. Extremities:  left lower extremity pitting edema, left knee in knee brace Skin: No rashes, lesions or ulcers Psychiatry: Judgement and insight appear normal. Mood & affect appropriate.     Data Reviewed: I have personally reviewed  labs and visualized  imaging studies since the last encounter and formulate the plan        Scheduled Meds:  aspirin EC  81 mg Oral QODAY   bisacodyl  10 mg Rectal Daily   carvedilol  6.25 mg Oral BID   cephALEXin  500 mg Oral Q12H   cholecalciferol  1,000 Units Oral Daily   enoxaparin (LOVENOX) injection  40 mg Subcutaneous Q24H   ezetimibe  10 mg Oral QHS   And   simvastatin  80 mg Oral QHS   melatonin  3 mg Oral QHS    pantoprazole  40 mg Oral BID AC   polyethylene glycol  17 g Oral Daily   senna-docusate  1 tablet Oral BID   sertraline  100 mg Oral Daily  sodium chloride flush  3 mL Intravenous Q12H   [START ON 12/02/2021] vitamin B-12  1,000 mcg Oral Daily   Continuous Infusions:  sodium chloride 10 mL/hr at 11/29/21 0908     LOS: 3 days      Florencia Reasons, MD PhD FACP [facility]  Available via Epic secure chat 7am-7pm for nonurgent issues Please page for urgent issues To page the attending provider between 7A-7P or the covering provider during after hours 7P-7A, please log into the web site [email or web address] and access using universal [place] password for that web site. If you do not have the password, please call the hospital operator.    12/01/2021, 5:47 PM

## 2021-12-02 DIAGNOSIS — J9601 Acute respiratory failure with hypoxia: Secondary | ICD-10-CM | POA: Diagnosis not present

## 2021-12-02 DIAGNOSIS — R319 Hematuria, unspecified: Secondary | ICD-10-CM | POA: Diagnosis not present

## 2021-12-02 DIAGNOSIS — N390 Urinary tract infection, site not specified: Secondary | ICD-10-CM | POA: Diagnosis not present

## 2021-12-02 DIAGNOSIS — R627 Adult failure to thrive: Secondary | ICD-10-CM | POA: Diagnosis not present

## 2021-12-02 LAB — CBC WITH DIFFERENTIAL/PLATELET
Abs Immature Granulocytes: 0.06 10*3/uL (ref 0.00–0.07)
Basophils Absolute: 0 10*3/uL (ref 0.0–0.1)
Basophils Relative: 0 %
Eosinophils Absolute: 0.2 10*3/uL (ref 0.0–0.5)
Eosinophils Relative: 3 %
HCT: 27.8 % — ABNORMAL LOW (ref 36.0–46.0)
Hemoglobin: 8.8 g/dL — ABNORMAL LOW (ref 12.0–15.0)
Immature Granulocytes: 1 %
Lymphocytes Relative: 21 %
Lymphs Abs: 1.5 10*3/uL (ref 0.7–4.0)
MCH: 32.7 pg (ref 26.0–34.0)
MCHC: 31.7 g/dL (ref 30.0–36.0)
MCV: 103.3 fL — ABNORMAL HIGH (ref 80.0–100.0)
Monocytes Absolute: 0.9 10*3/uL (ref 0.1–1.0)
Monocytes Relative: 12 %
Neutro Abs: 4.5 10*3/uL (ref 1.7–7.7)
Neutrophils Relative %: 63 %
Platelets: 141 10*3/uL — ABNORMAL LOW (ref 150–400)
RBC: 2.69 MIL/uL — ABNORMAL LOW (ref 3.87–5.11)
RDW: 14.4 % (ref 11.5–15.5)
WBC: 7.2 10*3/uL (ref 4.0–10.5)
nRBC: 0 % (ref 0.0–0.2)

## 2021-12-02 LAB — COMPREHENSIVE METABOLIC PANEL
ALT: 22 U/L (ref 0–44)
AST: 25 U/L (ref 15–41)
Albumin: 2.1 g/dL — ABNORMAL LOW (ref 3.5–5.0)
Alkaline Phosphatase: 165 U/L — ABNORMAL HIGH (ref 38–126)
Anion gap: 4 — ABNORMAL LOW (ref 5–15)
BUN: 13 mg/dL (ref 8–23)
CO2: 25 mmol/L (ref 22–32)
Calcium: 8.4 mg/dL — ABNORMAL LOW (ref 8.9–10.3)
Chloride: 108 mmol/L (ref 98–111)
Creatinine, Ser: 0.72 mg/dL (ref 0.44–1.00)
GFR, Estimated: >60 mL/min (ref >60)
Glucose, Bld: 105 mg/dL — ABNORMAL HIGH (ref 70–99)
Potassium: 4.6 mmol/L (ref 3.5–5.1)
Sodium: 137 mmol/L (ref 135–145)
Total Bilirubin: 0.5 mg/dL (ref 0.3–1.2)
Total Protein: 5 g/dL — ABNORMAL LOW (ref 6.5–8.1)

## 2021-12-02 LAB — PROCALCITONIN: Procalcitonin: 0.39 ng/mL

## 2021-12-02 LAB — TSH: TSH: 3.639 u[IU]/mL (ref 0.350–4.500)

## 2021-12-02 MED ORDER — MAGIC MOUTHWASH W/LIDOCAINE
5.0000 mL | Freq: Three times a day (TID) | ORAL | Status: DC | PRN
  Administered 2021-12-02 – 2021-12-03 (×2): 5 mL via ORAL
  Filled 2021-12-02 (×3): qty 5

## 2021-12-02 MED ORDER — CARVEDILOL 3.125 MG PO TABS
3.1250 mg | ORAL_TABLET | Freq: Two times a day (BID) | ORAL | Status: DC
Start: 2021-12-02 — End: 2021-12-03
  Administered 2021-12-02 – 2021-12-03 (×2): 3.125 mg via ORAL
  Filled 2021-12-02 (×3): qty 1

## 2021-12-02 NOTE — Care Management Important Message (Signed)
Important Message  Patient Details IM Letter given to the Patient. Name: Heather Garrett MRN: [id number] Date of Birth: 1950-11-11   Medicare Important Message Given:  Yes     Kerin Salen 12/02/2021, 10:10 AM

## 2021-12-02 NOTE — Progress Notes (Signed)
Occupational Therapy Treatment Patient Details Name: Heather Garrett MRN: [id number] DOB: 02-Oct-1950 Today's Date: 12/02/2021   History of present illness 71 yo female admitted with UTI, fall at home, L LE pain/weakness. Hx of L THA for femoral neck fx 08/2021, L pubic rami fx 2/23, falls, osteopenia, bladder Ca, CAD, MI, OSA   OT comments  Patient progressing and showed improved ability to stand to RW with Moderate assist of 1 person, compared to previous session where pt required Max assist and a 2nd person.  Pt does demonstrate some unsafe behaviors with RW and will require more practice to improve her transition to a full stand without lifting RW u p from floor due to posterior leaning.  Pt is motivated and willing to work with therapy despite increased pain with weight bearing.   Patient remains limited by LLE pain, anxiety with standing, generalized weakness and decreased activity tolerance along with deficits noted below. Pt continues to demonstrate good rehab potential and would benefit from continued skilled OT to increase safety and independence with ADLs and functional transfers to allow pt to return home safely and reduce caregiver burden and fall risk.   ?    Recommendations for follow up therapy are one component of a multi-disciplinary discharge planning process, led by the attending physician.  Recommendations may be updated based on patient status, additional functional criteria and insurance authorization.    Follow Up Recommendations  Skilled nursing-short term rehab (<3 hours/day)    Assistance Recommended at Discharge Frequent or constant Supervision/Assistance  Patient can return home with the following  A lot of help with bathing/dressing/bathroom;Two people to help with walking and/or transfers;Assistance with cooking/housework;Assist for transportation;Help with stairs or ramp for entrance   Equipment Recommendations  None recommended by OT    Recommendations  for Other Services      Precautions / Restrictions Precautions Precautions: Fall Precaution Comments: L LE pain-pt wearing knee sleeve issued in ED Restrictions Weight Bearing Restrictions: Yes LLE Weight Bearing: Weight bearing as tolerated       Mobility Bed Mobility Overal bed mobility: Needs Assistance Bed Mobility: Supine to Sit     Supine to sit: Min assist, HOB elevated     General bed mobility comments: Increased time/effort for pt to slowly transition from supine to sit with assist to support LLE.    Transfers                         Balance Overall balance assessment: Needs assistance, History of Falls Sitting-balance support: Bilateral upper extremity supported, Feet supported Sitting balance-Leahy Scale: Good     Standing balance support: Bilateral upper extremity supported, Reliant on assistive device for balance, During functional activity Standing balance-Leahy Scale: Poor Standing balance comment: Picks RW up from floor, needs max cues to use UEs to push through RW to steady. Pt anxious and stating that she does not trust the RW.                           ADL either performed or assessed with clinical judgement   ADL Overall ADL's : Needs assistance/impaired     Grooming: Wash/dry hands;Oral care;Wash/dry face;Set up;Sitting;Brushing hair           Upper Body Dressing : Set up;Sitting Upper Body Dressing Details (indicate cue type and reason): To don fresh anterior hospital gown in recliner     Toilet Transfer: Moderate assistance;Maximal assistance;Rolling walker (2  wheels);Stand-pivot;Cueing for safety;Cueing for sequencing Toilet Transfer Details (indicate cue type and reason): Pt stood from EOB to RW with Moderate assist, but could not achieve a good BOS, was anxious and lifing RW off the floor despite cues, and ultimately pt initiated uncontrolled descent back to EOB without warning and Max As used for safety. Time spent  teaching pt on transfers and proper use of RW before 2ns stand.  Pt stood again from EOB to RW with Moderate assist and increased time was needed for pt to find BOS and learn how to handle RW to keep on floor. Once pt steady, she performed a step pivot to chair taking ~4 forward steps and 2 backwards steps with cues for sequencing and Moderate assist for pivot and descent to chair and cues for UE reach back.         Functional mobility during ADLs: Moderate assistance;Maximal assistance;Rolling walker (2 wheels)      Extremity/Trunk Assessment Upper Extremity Assessment Upper Extremity Assessment: Overall WFL for tasks assessed   Lower Extremity Assessment Lower Extremity Assessment: Defer to PT evaluation   Cervical / Trunk Assessment Cervical / Trunk Assessment: Kyphotic    Vision   Vision Assessment?: No apparent visual deficits   Perception     Praxis      Cognition Arousal/Alertness: Awake/alert Behavior During Therapy: WFL for tasks assessed/performed, Anxious, Impulsive Overall Cognitive Status: Within Functional Limits for tasks assessed                                          Exercises      Shoulder Instructions       General Comments      Pertinent Vitals/ Pain       Pain Assessment Pain Assessment: 0-10 Pain Score: 5  Pain Location: L LE with weight bearing Pain Descriptors / Indicators: Discomfort, Sore, Aching, Guarding, Grimacing Pain Intervention(s): Limited activity within patient's tolerance, Monitored during session, Repositioned, RN gave pain meds during session  Home Living                                          Prior Functioning/Environment              Frequency  Min 2X/week        Progress Toward Goals  OT Goals(current goals can now be found in the care plan section)  Progress towards OT goals: Progressing toward goals  Acute Rehab OT Goals Patient Stated Goal: Decreased LLE pain OT  Goal Formulation: With patient Time For Goal Achievement: 12/14/21 Potential to Achieve Goals: Good  Plan Discharge plan remains appropriate    Co-evaluation                 AM-PAC OT "6 Clicks" Daily Activity     Outcome Measure   Help from another person eating meals?: None Help from another person taking care of personal grooming?: None Help from another person toileting, which includes using toliet, bedpan, or urinal?: A Lot Help from another person bathing (including washing, rinsing, drying)?: A Lot Help from another person to put on and taking off regular upper body clothing?: A Little Help from another person to put on and taking off regular lower body clothing?: A Lot 6 Click Score: 17    End  of Session Equipment Utilized During Treatment: Rolling walker (2 wheels);Oxygen  OT Visit Diagnosis: Other abnormalities of gait and mobility (R26.89);Unsteadiness on feet (R26.81)   Activity Tolerance Patient limited by fatigue   Patient Left in chair;with call bell/phone within reach;with nursing/sitter in room   Nurse Communication Mobility status        Time: 7356-7014 OT Time Calculation (min): 34 min  Charges: OT General Charges $OT Visit: 1 Visit OT Treatments $Self Care/Home Management : 8-22 mins $Therapeutic Activity: 8-22 mins  Anderson Malta, OT Acute Rehab Services Office: [phone] 12/02/2021  Julien Girt 12/02/2021, 1:14 PM

## 2021-12-02 NOTE — Progress Notes (Signed)
PROGRESS NOTE    Heather Garrett  [id number]:[id number] DOB: 1950/12/03 DOA: 11/28/2021 PCP: Pcp, No     Brief Narrative:    coronary artery disease, bladder cancer in remission per patient report, hypertension, OSA not on CPAP, and left hip fracture in February status post total hip arthroplasty, now presenting to the emergency department with left knee pain after fall.  Patient reports a few days of dysuria and low back pain, but had been going about her usual activities and was ambulating with a walker in her home when she fell and developed left knee pain, left knee x ray no fracture Found to have uti  Subjective:   Left leg edema Left knee pain No bm, would like to have suppository  Assessment & Plan:  Principal Problem:   UTI (urinary tract infection) Active Problems:   Coronary arteriosclerosis in native artery   Hypertensive disorder   Prolonged QT interval    Assessment and Plan:  Ecoli UTI, POA Urine culture positive for E. coli intermediate to ampicillin sensitive to the rest of antibiotic tested Blood culture no growth Leukocytosis normalized  Acute hypoxia Oxygen dropped to 83% on room air while working with PT Denies feeling short of breath, does not appear significantly volume overloaded Chest x-ray : Cardiomegaly. Small right pleural effusion. Additional ill-defined opacity at the right lung base, which may reflect atelectasis and/or consolidation. Minimal left basilar atelectasis. 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   Constipation Reports not resolving needs Senokot and MiraLAX, had bm after suppository    Left knee pain/left lower extremity edema, recent history of left hip surgery on 2/27 Left knee x-ray no fractures Continue knee brace  left lower extremity Doppler negative for  DVT Need to follow up with ortho   H/o bladder cancer in remission  H/o breast cancer /breast surgery, in remission    FTT, seen by PT recommend snf placement        I have Reviewed nursing notes, Vitals, pain scores, I/o's, Lab results and  imaging results since pt's last encounter, details please see discussion above  I ordered the following labs:  Unresulted Labs (From admission, onward)     Start     Ordered   12/05/21 0500  Creatinine, serum  (enoxaparin (LOVENOX)    CrCl >/= 30 ml/min)  Weekly,   R     Comments: while on enoxaparin therapy    11/28/21 2154   12/03/21 0500  CBC with Differential/Platelet  Tomorrow morning,   R        12/02/21 1046   12/03/21 6962  Basic metabolic panel  Tomorrow morning,   R        12/02/21 1046  12/03/21 0500  Magnesium  Tomorrow morning,   R        12/02/21 1046   12/02/21 0500  Procalcitonin  Daily,   R      12/01/21 1418   Unscheduled  Occult blood card to lab, stool  As needed,   R      12/02/21 0834             DVT prophylaxis: enoxaparin (LOVENOX) injection 40 mg Start: 11/29/21 1000   Code Status:   Code Status: Full Code  Family Communication: Husband at bedside on 5/25 Disposition:   Status is: Inpatient   Dispo: The patient is from: home              Anticipated d/c is to:  SNF              Anticipated d/c date is: 5/26 if hgb stable   Antimicrobials:    Anti-infectives (From admission, onward)    Start     Dose/Rate Route Frequency Ordered Stop   12/01/21 2200  cephALEXin (KEFLEX) capsule 500 mg        500 mg Oral Every 12 hours 12/01/21 1607 12/05/21 2159   11/29/21 1800  cefTRIAXone (ROCEPHIN) 2 g in sodium chloride 0.9 % 100 mL IVPB  Status:  Discontinued        2 g 200 mL/hr over 30 Minutes Intravenous Every 24 hours 11/28/21 2154 12/01/21 1607   11/28/21 2345  cefTRIAXone (ROCEPHIN) 1 g in sodium chloride 0.9 % 100 mL IVPB        1 g 200 mL/hr over 30 Minutes Intravenous  Once 11/28/21 2258 11/29/21 0213   11/28/21 1800  cefTRIAXone (ROCEPHIN) 1 g in sodium chloride 0.9 % 100 mL IVPB        1 g 200 mL/hr over 30 Minutes Intravenous  Once 11/28/21 1749 11/28/21 1840          Objective: Vitals:   12/01/21 1331 12/01/21 1935 12/02/21 0602 12/02/21 1422  BP: 104/71 1'24/86 91/62 91/63 '$  Pulse: 73 79 (!) 59 67  Resp: '18 20 18 15  '$ Temp: 98.2 F (36.8 C) 98.6 F (37 C) 98.5 F (36.9 C) 98.7 F (37.1 C)  TempSrc: Oral Oral Oral Oral  SpO2: 98% 99% 99% 99%  Weight:   54.7 kg   Height:        Intake/Output Summary (Last 24 hours) at 12/02/2021 1915 Last data filed at 12/02/2021 1235 Gross per 24 hour  Intake 357 ml  Output 300 ml  Net 57 ml   Filed Weights   11/28/21 1915 12/02/21 0602  Weight: 51.3 kg 54.7 kg    Examination:  General exam: appear weak, aaox3 Respiratory system: . Diminished, no wheezing,no rales, no rhonchi, Respiratory effort normal. Cardiovascular system:  RRR.  Gastrointestinal system: Abdomen is nondistended, soft and nontender.  Normal bowel sounds heard. Central nervous system: Alert and oriented. No focal neurological deficits. Extremities:  left lower extremity pitting edema, left knee in knee brace Skin: No rashes, lesions or ulcers Psychiatry: Judgement and insight appear normal. Mood & affect  appropriate.     Data Reviewed: I have personally reviewed  labs and visualized  imaging studies since the last encounter and formulate the plan        Scheduled Meds:  aspirin EC  81 mg Oral QODAY   bisacodyl  10 mg Rectal Daily   carvedilol  3.125 mg Oral BID   cephALEXin  500 mg Oral Q12H  cholecalciferol  1,000 Units Oral Daily   enoxaparin (LOVENOX) injection  40 mg Subcutaneous Q24H   ezetimibe  10 mg Oral QHS   And   simvastatin  80 mg Oral QHS   melatonin  3 mg Oral QHS   pantoprazole  40 mg Oral BID AC   polyethylene glycol  17 g Oral Daily   senna-docusate  1 tablet Oral BID   sertraline  100 mg Oral Daily   sodium chloride flush  3 mL Intravenous Q12H   vitamin B-12  1,000 mcg Oral Daily   Continuous Infusions:     LOS: 4 days      Florencia Reasons, MD PhD FACP [facility]  Available via Epic secure chat 7am-7pm for nonurgent issues Please page for urgent issues To page the attending provider between 7A-7P or the covering provider during after hours 7P-7A, please log into the web site [email or web address] and access using universal [place] password for that web site. If you do not have the password, please call the hospital operator.    12/02/2021, 7:15 PM

## 2021-12-02 NOTE — Plan of Care (Signed)

## 2021-12-03 DIAGNOSIS — Z7401 Bed confinement status: Secondary | ICD-10-CM | POA: Diagnosis not present

## 2021-12-03 DIAGNOSIS — B9620 Unspecified Escherichia coli [E. coli] as the cause of diseases classified elsewhere: Secondary | ICD-10-CM

## 2021-12-03 DIAGNOSIS — R319 Hematuria, unspecified: Secondary | ICD-10-CM | POA: Diagnosis not present

## 2021-12-03 DIAGNOSIS — R627 Adult failure to thrive: Secondary | ICD-10-CM | POA: Diagnosis not present

## 2021-12-03 DIAGNOSIS — J159 Unspecified bacterial pneumonia: Secondary | ICD-10-CM | POA: Diagnosis not present

## 2021-12-03 DIAGNOSIS — I5030 Unspecified diastolic (congestive) heart failure: Secondary | ICD-10-CM | POA: Diagnosis not present

## 2021-12-03 DIAGNOSIS — J9601 Acute respiratory failure with hypoxia: Secondary | ICD-10-CM | POA: Diagnosis not present

## 2021-12-03 DIAGNOSIS — M6281 Muscle weakness (generalized): Secondary | ICD-10-CM | POA: Diagnosis not present

## 2021-12-03 DIAGNOSIS — I2510 Atherosclerotic heart disease of native coronary artery without angina pectoris: Secondary | ICD-10-CM | POA: Diagnosis not present

## 2021-12-03 DIAGNOSIS — R2689 Other abnormalities of gait and mobility: Secondary | ICD-10-CM | POA: Diagnosis not present

## 2021-12-03 DIAGNOSIS — I10 Essential (primary) hypertension: Secondary | ICD-10-CM | POA: Diagnosis not present

## 2021-12-03 DIAGNOSIS — D649 Anemia, unspecified: Secondary | ICD-10-CM | POA: Diagnosis not present

## 2021-12-03 DIAGNOSIS — N390 Urinary tract infection, site not specified: Secondary | ICD-10-CM | POA: Diagnosis not present

## 2021-12-03 DIAGNOSIS — R41841 Cognitive communication deficit: Secondary | ICD-10-CM | POA: Diagnosis not present

## 2021-12-03 DIAGNOSIS — D508 Other iron deficiency anemias: Secondary | ICD-10-CM

## 2021-12-03 DIAGNOSIS — I519 Heart disease, unspecified: Secondary | ICD-10-CM | POA: Diagnosis not present

## 2021-12-03 DIAGNOSIS — J90 Pleural effusion, not elsewhere classified: Secondary | ICD-10-CM | POA: Diagnosis not present

## 2021-12-03 DIAGNOSIS — W19XXXA Unspecified fall, initial encounter: Secondary | ICD-10-CM | POA: Diagnosis not present

## 2021-12-03 LAB — CBC WITH DIFFERENTIAL/PLATELET
Abs Immature Granulocytes: 0.06 10*3/uL (ref 0.00–0.07)
Basophils Absolute: 0 10*3/uL (ref 0.0–0.1)
Basophils Relative: 0 %
Eosinophils Absolute: 0.3 10*3/uL (ref 0.0–0.5)
Eosinophils Relative: 4 %
HCT: 29.7 % — ABNORMAL LOW (ref 36.0–46.0)
Hemoglobin: 9 g/dL — ABNORMAL LOW (ref 12.0–15.0)
Immature Granulocytes: 1 %
Lymphocytes Relative: 20 %
Lymphs Abs: 1.6 10*3/uL (ref 0.7–4.0)
MCH: 31.9 pg (ref 26.0–34.0)
MCHC: 30.3 g/dL (ref 30.0–36.0)
MCV: 105.3 fL — ABNORMAL HIGH (ref 80.0–100.0)
Monocytes Absolute: 1.1 10*3/uL — ABNORMAL HIGH (ref 0.1–1.0)
Monocytes Relative: 14 %
Neutro Abs: 4.8 10*3/uL (ref 1.7–7.7)
Neutrophils Relative %: 61 %
Platelets: 171 10*3/uL (ref 150–400)
RBC: 2.82 MIL/uL — ABNORMAL LOW (ref 3.87–5.11)
RDW: 14.7 % (ref 11.5–15.5)
WBC: 7.8 10*3/uL (ref 4.0–10.5)
nRBC: 0 % (ref 0.0–0.2)

## 2021-12-03 LAB — BASIC METABOLIC PANEL
Anion gap: 6 (ref 5–15)
BUN: 15 mg/dL (ref 8–23)
CO2: 23 mmol/L (ref 22–32)
Calcium: 8.3 mg/dL — ABNORMAL LOW (ref 8.9–10.3)
Chloride: 109 mmol/L (ref 98–111)
Creatinine, Ser: 0.69 mg/dL (ref 0.44–1.00)
GFR, Estimated: >60 mL/min (ref >60)
Glucose, Bld: 99 mg/dL (ref 70–99)
Potassium: 3.6 mmol/L (ref 3.5–5.1)
Sodium: 138 mmol/L (ref 135–145)

## 2021-12-03 LAB — PROCALCITONIN: Procalcitonin: 0.15 ng/mL

## 2021-12-03 LAB — MAGNESIUM: Magnesium: 2 mg/dL (ref 1.7–2.4)

## 2021-12-03 MED ORDER — CEPHALEXIN 500 MG PO CAPS
500.0000 mg | ORAL_CAPSULE | Freq: Two times a day (BID) | ORAL | 0 refills | Status: AC
Start: 2021-12-03 — End: 2021-12-05

## 2021-12-03 MED ORDER — FOLIC ACID 1 MG PO TABS: 1.0000 mg | ORAL_TABLET | Freq: Every day | ORAL | 3 refills | Status: DC

## 2021-12-03 MED ORDER — THIAMINE HCL 100 MG PO TABS: 100.0000 mg | ORAL_TABLET | Freq: Every day | ORAL | Status: DC

## 2021-12-03 MED ORDER — CARVEDILOL 3.125 MG PO TABS: 3.1250 mg | ORAL_TABLET | Freq: Two times a day (BID) | ORAL | Status: DC

## 2021-12-03 MED ORDER — LIDOCAINE VISCOUS HCL 2 % MT SOLN: 15.0000 mL | Freq: Four times a day (QID) | OROMUCOSAL | 0 refills | Status: DC | PRN

## 2021-12-03 MED ORDER — OXYCODONE HCL 5 MG PO TABS: 5.0000 mg | ORAL_TABLET | ORAL | 0 refills | Status: DC | PRN

## 2021-12-03 NOTE — TOC Transition Note (Signed)
Transition of Care [facility]) - CM/SW Discharge Note   Patient Details  Name: DEVINE DANT MRN: [id number] Date of Birth: [birth year]  [facility]) CM/SW Contact:  Bethann Berkshire, [place] Phone Number: 12/03/2021, 11:02 AM   Clinical Narrative:     Patient will DC to: [facility] Anticipated DC date: 12/03/21 Family notified: Pt notified family Transport by: Corey Harold   Per MD patient ready for DC to [facility]. RN, patient, and facility notified of DC. Discharge Summary and FL2 sent to facility. RN to call report prior to discharge [phone]). DC packet on chart. Ambulance transport requested for patient.   CSW will sign off for now as social work intervention is no longer needed. Please consult [place] again if new needs arise.   Final next level of care: [place] Barriers to Discharge: No Barriers Identified   Patient Goals and CMS Choice Patient states their goals for this hospitalization and ongoing recovery are:: Patient plans to go to SNF then go home. CMS Medicare.gov Compare Post Acute Care list provided to:: Patient Choice offered to / list presented to : Patient  Discharge Placement                       Discharge Plan and Services     Post Acute Care Choice: Skilled Nursing Facility                               Social Determinants of Health (SDOH) Interventions     Readmission Risk Interventions     View : No data to display.

## 2021-12-03 NOTE — Progress Notes (Signed)
Patient discharged to [facility] via [place].

## 2021-12-03 NOTE — Discharge Summary (Addendum)
Discharge Summary  Heather Garrett KZS:[id number] DOB: 09-30-1950  PCP: Pcp, No  Admit date: 11/28/2021 Discharge date: 12/03/2021  Time spent: 17mns, more than 50% time spent on coordination of care.   Recommendations for Outpatient Follow-up:  F/u with SNF MD  for hospital discharge follow up, repeat cbc/bmp at follow up F/u with ortho Dr MZachery Garrett two weeks for left knee and left hip F/u with GI Dr Heather Garrett 6/5 [facility]   Discharge Diagnoses:  Active Hospital Problems   Diagnosis Date Noted   UTI (urinary tract infection) 11/28/2021   Prolonged QT interval 11/28/2021   Hypertensive disorder 05/17/2021   Coronary arteriosclerosis in native artery 09/28/2016    Resolved Hospital Problems  No resolved problems to display.    Discharge Condition: stable  Diet recommendation: heart healthy, avoid alcohol  Filed Weights   11/28/21 1915 12/02/21 0602 12/03/21 0603  Weight: 51.3 kg 54.7 kg 57.7 kg    History of present illness: (Per admitting MD Dr. OMyna Garrett  Patient coming from: Home    Chief Complaint: Fall with left knee pain    HPI: Heather MUNis a pleasant 71y.o. female with medical history significant for coronary artery disease, bladder cancer in remission per patient report, hypertension, OSA not on CPAP, and left hip fracture in February status post total hip arthroplasty, now presenting to the emergency department with left knee pain after fall.  Patient reports a few days of dysuria and low back pain, but had been going about her usual activities and was ambulating with a walker in her home when she fell and developed left knee pain.  She reports frequent dizzy spells to be a longstanding problem for her but she does not recall if she was dizzy at the time of the fall.  She is not exactly sure how she fell.  Aside from the knee pain, she denies any other injury.  She has not had any recent chest pain, cough,  shortness of breath, and denies any fever or chills.  She has had some mild nausea without vomiting. Her chronic leg swelling has been worse for a couple months, always more on the left than right. She has had venous [place] for left leg DVT previously.    [facility] ED Course: Upon arrival to the ED, patient is found to be afebrile with tachycardia and stable blood pressure.  ED work-up notable for WBC 15,800, lactic acid 2.3, and pyuria with positive nitrite and > 50 WBC per hpf on UA.  No acute findings on CT of the head or cervical spine.  Plain radiographs of the left knee are negative for acute osseous process but notable for small effusion.  Urine was sent for culture and the patient was treated with morphine, Zofran, Percocet, a liter of saline, and IV Rocephin.  [facility]   H[facility] Course:  Principal Problem:   UTI (urinary tract infection) Active Problems:   Coronary arteriosclerosis in native artery   Hypertensive disorder   Prolonged QT interval   Assessment and Plan:  Ecoli UTI, POA Urine culture positive for E. coli intermediate to ampicillin sensitive to the rest of antibiotic tested Blood culture no growth Received Rocephin initially, transition to Keflex to finish total of 7 days of that biotic treatment, end date on 5/28 Leukocytosis normalized   Acute hypoxia Oxygen dropped to 83% on room air while working with PT Denies feeling  short of breath, does not appear significantly volume overloaded Chest x-ray : Cardiomegaly. Small right pleural effusion. Additional ill-defined opacity at the right lung base, which may reflect atelectasis and/or consolidation. Minimal left basilar atelectasis. 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    Constipation Reports not resolving needs Senokot and MiraLAX, had bm after suppository      Left knee pain/left lower extremity edema, recent history of left hip surgery on 2/27 Left knee x-ray no fractures Continue knee brace  left lower extremity Doppler negative for  DVT Need to follow up with ortho     H/o bladder cancer in remission  H/o breast cancer /breast surgery, in remission    FTT, seen by PT recommend snf placement      Discharge Exam: BP 117/77 (BP Location: Left Arm)   Pulse 73   Temp 98.4 F (36.9 C) (Oral)   Resp 18   Ht '4\' 8"'$  (1.422 Heather)   Wt 57.7 kg   SpO2 98%   BMI 28.52 kg/Heather   General: frail, aaox4 Cardiovascular: RRR Respiratory: normal respiratory effort     Discharge Instructions     Diet - low sodium heart healthy   Complete by: As directed    Increase activity slowly   Complete by: As directed       Allergies as of 12/03/2021       Reactions   Contrast Media [iodinated Contrast Media] Anaphylaxis, Hives   Ciprofloxacin Other (See Comments)   Interaction w other medicine   Erythromycin Base Nausea And Vomiting   Sulfabenzamide Other (See Comments)   UNK reaction        Medication List     STOP taking these  medications    famotidine 40 MG tablet Commonly known as: PEPCID       TAKE these medications    Abreva 10 % Crea Generic drug: Docosanol Apply 1 application. topically 2 (two) times daily as needed (mouth ulcer).   acetaminophen 500 MG tablet Commonly known as: TYLENOL Take 1,000 mg by mouth every 6 (six) hours as needed for mild pain.   aspirin EC 81 MG tablet Take 1 tablet (81 mg total) by mouth every other day. Swallow whole.  Hold aspirin while taking eliquis, What changed:  when to take this additional instructions   Blink Tears 0.25 % Soln Generic drug: Polyethylene Glycol 400 Place 1 drop into both eyes daily as needed (dry eyes).   carvedilol 3.125 MG tablet Commonly known as: COREG Take 1 tablet (3.125 mg total) by mouth 2 (two) times daily. Hold if sbp less than  100 or heart rate less than 50 What changed:  medication strength how much to take additional instructions   cephALEXin 500 MG capsule Commonly known as: KEFLEX Take 1 capsule (500 mg total) by mouth every 12 (twelve) hours for 2 days.   Cholecalciferol 25 MCG (1000 UT) tablet Take 1,000 Units by mouth daily.   ezetimibe-simvastatin 10-80 MG tablet Commonly known as: VYTORIN Take 1 tablet by mouth at bedtime.   ferrous sulfate 325 (65 FE) MG EC tablet Take 1 tablet (325 mg total) by mouth every Monday, Wednesday, and Friday.   folic acid 1 MG tablet Commonly known as: FOLVITE Take 1 tablet (1 mg total) by mouth daily.   hydrochlorothiazide 25 MG tablet Commonly known as: HYDRODIURIL Take 25 mg by mouth daily as needed (swelling/fluid).   lidocaine 2 % solution Commonly known as: XYLOCAINE Use as directed 15 mLs in the mouth or throat every 6 (six) hours as needed for mouth pain.   lidocaine 5 % ointment Commonly known as: XYLOCAINE Apply 1 application topically as needed.   magic mouthwash Soln 15 mLs by Mouth Rinse route 2 (two) times daily as needed for throat irritation /  pain.   Nurtec 75 MG Tbdp Generic drug: Rimegepant Sulfate Take 75 mg by mouth daily as needed for headache.   oxyCODONE 5 MG immediate release tablet Commonly known as: Oxy IR/ROXICODONE Take 1 tablet (5 mg total) by mouth every 4 (four) hours as needed for severe pain.   pantoprazole 40 MG tablet Commonly known as: PROTONIX Take 1 tablet (40 mg total) by mouth 2 (two) times daily before a meal.   polyethylene glycol 17 g packet Commonly known as: MIRALAX / GLYCOLAX Take 17 g by mouth daily. What changed:  when to take this reasons to take this   senna-docusate 8.6-50 MG tablet Commonly known as: Senokot-S Take 1 tablet by mouth at bedtime.   sertraline 100 MG tablet Commonly known as: ZOLOFT Take 100 mg by mouth daily.   thiamine 100 MG tablet Take 1 tablet (100 mg total) by mouth daily.   tiZANidine 4 MG tablet Commonly known as: ZANAFLEX Take 4 mg by mouth at bedtime.   vitamin B-12 1000 MCG tablet Commonly known as: CYANOCOBALAMIN Take 1,000 mcg by mouth daily.       Allergies  Allergen Reactions   Contrast Media [Iodinated Contrast Media] Anaphylaxis and Hives   Ciprofloxacin Other (See Comments)    Interaction w other medicine   Erythromycin Base Nausea And Vomiting   Sulfabenzamide Other (See Comments)    UNK reaction    Follow-up Information     Willaim Sheng, MD Follow up.   Specialty: Orthopedic Surgery Why: for left hip surgery also for left knee pain Contact information: [place] [address] [phone]                  The results of significant diagnostics from this hospitalization (including imaging, microbiology, ancillary and laboratory) are listed below for reference.    Significant Diagnostic Studies: DG Tibia/Fibula Left  Result Date: 11/28/2021 CLINICAL DATA:  Recent fall, knee pain EXAM: LEFT TIBIA AND FIBULA - 2 VIEW COMPARISON:  11/28/2021 FINDINGS: Bones are osteopenic. No acute  osseous finding, displaced fracture or malalignment. Peripheral atherosclerosis noted. IMPRESSION: Osteopenia.  No acute finding by plain radiography Peripheral atherosclerosis Electronically Signed   By: Jerilynn Mages.  Shick Heather.D.   On: 11/28/2021 15:27   CT Head Wo Contrast  Result  Date: 11/28/2021 CLINICAL DATA:  Head trauma, minor (Age >= 65y); Neck trauma (Age >= 65y) EXAM: CT HEAD WITHOUT CONTRAST CT CERVICAL SPINE WITHOUT CONTRAST TECHNIQUE: Multidetector CT imaging of the head and cervical spine was performed following the standard protocol without intravenous contrast. Multiplanar CT image reconstructions of the cervical spine were also generated. RADIATION DOSE REDUCTION: This exam was performed according to the departmental dose-optimization program which includes automated exposure control, adjustment of the mA and/or kV according to patient size and/or use of iterative reconstruction technique. COMPARISON:  MRI 01/18/2021 FINDINGS: CT HEAD FINDINGS Brain: No evidence of acute infarction, hemorrhage, hydrocephalus, extra-axial collection or mass lesion/mass effect. Patchy low-density changes within the periventricular and subcortical white matter compatible with chronic microvascular ischemic change. Mild-to-moderate diffuse cerebral volume loss. Vascular: No hyperdense vessel or unexpected calcification. Skull: Normal. Negative for fracture or focal lesion. Sinuses/Orbits: No acute finding. Other: None. CT CERVICAL SPINE FINDINGS Alignment: Facet joints are aligned without dislocation or traumatic listhesis. Dens and lateral masses are aligned. Mild grade 1 anterolisthesis of C3 on C4 and C4 on C5 mediated by degenerative facet arthropathy. Skull base and vertebrae: Mild superior endplate compression deformity of T2 with mild sclerosis at the superior endplate suggesting a subacute or chronic process. No evidence of an acute fracture of the cervical spine. No suspicious lytic or sclerotic bone lesion. Soft  tissues and spinal canal: No prevertebral fluid or swelling. No visible canal hematoma. Disc levels: Intervertebral disc heights are preserved. Degenerative facet arthropathy within the upper cervical spine. Upper chest: Negative. Other: None. IMPRESSION: 1. No acute intracranial abnormality. 2. No acute cervical spine fracture. 3. Mild superior endplate compression deformity of T2 with mild sclerosis at the superior endplate suggesting a subacute or chronic process. Correlate with point tenderness. 4. Chronic microvascular ischemic change and cerebral volume loss. Electronically Signed   By: Davina Poke D.O.   On: 11/28/2021 15:28   CT Cervical Spine Wo Contrast  Result Date: 11/28/2021 CLINICAL DATA:  Head trauma, minor (Age >= 65y); Neck trauma (Age >= 65y) EXAM: CT HEAD WITHOUT CONTRAST CT CERVICAL SPINE WITHOUT CONTRAST TECHNIQUE: Multidetector CT imaging of the head and cervical spine was performed following the standard protocol without intravenous contrast. Multiplanar CT image reconstructions of the cervical spine were also generated. RADIATION DOSE REDUCTION: This exam was performed according to the departmental dose-optimization program which includes automated exposure control, adjustment of the mA and/or kV according to patient size and/or use of iterative reconstruction technique. COMPARISON:  MRI 01/18/2021 FINDINGS: CT HEAD FINDINGS Brain: No evidence of acute infarction, hemorrhage, hydrocephalus, extra-axial collection or mass lesion/mass effect. Patchy low-density changes within the periventricular and subcortical white matter compatible with chronic microvascular ischemic change. Mild-to-moderate diffuse cerebral volume loss. Vascular: No hyperdense vessel or unexpected calcification. Skull: Normal. Negative for fracture or focal lesion. Sinuses/Orbits: No acute finding. Other: None. CT CERVICAL SPINE FINDINGS Alignment: Facet joints are aligned without dislocation or traumatic  listhesis. Dens and lateral masses are aligned. Mild grade 1 anterolisthesis of C3 on C4 and C4 on C5 mediated by degenerative facet arthropathy. Skull base and vertebrae: Mild superior endplate compression deformity of T2 with mild sclerosis at the superior endplate suggesting a subacute or chronic process. No evidence of an acute fracture of the cervical spine. No suspicious lytic or sclerotic bone lesion. Soft tissues and spinal canal: No prevertebral fluid or swelling. No visible canal hematoma. Disc levels: Intervertebral disc heights are preserved. Degenerative facet arthropathy within the upper cervical spine. Upper chest:  Negative. Other: None. IMPRESSION: 1. No acute intracranial abnormality. 2. No acute cervical spine fracture. 3. Mild superior endplate compression deformity of T2 with mild sclerosis at the superior endplate suggesting a subacute or chronic process. Correlate with point tenderness. 4. Chronic microvascular ischemic change and cerebral volume loss. Electronically Signed   By: Davina Poke D.O.   On: 11/28/2021 15:28   DG CHEST PORT 1 VIEW  Result Date: 12/01/2021 CLINICAL DATA:  Provided history: Hypoxia. EXAM: PORTABLE CHEST 1 VIEW COMPARISON:  Chest radiograph 09/05/2021. FINDINGS: Mild cardiomegaly. Aortic atherosclerosis. Small right pleural effusion. Additional ill-defined opacity at the right lung base which may reflect atelectasis and/or consolidation. Minimal left basilar atelectasis. No evidence of pneumothorax. No acute bony abnormality identified. IMPRESSION: Cardiomegaly. Small right pleural effusion. Additional ill-defined opacity at the right lung base, which may reflect atelectasis and/or consolidation. Minimal left basilar atelectasis. Aortic Atherosclerosis (ICD10-I70.0). Electronically Signed   By: Kellie Simmering D.O.   On: 12/01/2021 11:26   DG Knee Complete 4 Views Left  Result Date: 11/28/2021 CLINICAL DATA:  Fall. EXAM: LEFT KNEE - COMPLETE 4+ VIEW  COMPARISON:  None Available. FINDINGS: The bones are diffusely osteopenic. Small joint effusion present. Vascular calcifications are seen in the soft tissues. There is no acute fracture or dislocation identified. There is patellofemoral compartment joint space narrowing. Alignment is anatomic. IMPRESSION: 1. Small joint effusion. 2. No acute bony abnormality. 3. Mild degenerative changes. 4. Diffuse osteopenia. Electronically Signed   By: Ronney Asters Heather.D.   On: 11/28/2021 15:28   VAS [place] LOWER EXTREMITY VENOUS (DVT)  Result Date: 12/01/2021  Lower Venous DVT Study Patient Name:  Heather Garrett  Date of Exam:   12/01/2021 Medical Rec #: [id number]            Accession #:    [id number] Date of Birth: Apr 09, 1951            Patient Gender: F Patient Age:   53 years Exam [facility] Procedure:      VAS [place] LOWER EXTREMITY VENOUS (DVT) Referring Phys: Annamaria Boots Skarlet Lyons --------------------------------------------------------------------------------  Indications: Edema.  Risk Factors: Trauma. Limitations: Poor ultrasound/tissue interface and patient pain tolerance. Comparison Study: No prior studies. Performing Technologist: Oliver Hum RVT  Examination Guidelines: A complete evaluation includes B-mode imaging, spectral Doppler, color Doppler, and power Doppler as needed of all accessible portions of each vessel. Bilateral testing is considered an integral part of a complete examination. Limited examinations for reoccurring indications may be performed as noted. The reflux portion of the exam is performed with the patient in reverse Trendelenburg.  +-----+---------------+---------+-----------+----------+--------------+ RIGHTCompressibilityPhasicitySpontaneityPropertiesThrombus Aging +-----+---------------+---------+-----------+----------+--------------+ CFV  Full           Yes      Yes                                 +-----+---------------+---------+-----------+----------+--------------+    +---------+---------------+---------+-----------+----------+-------------------+ LEFT     CompressibilityPhasicitySpontaneityPropertiesThrombus Aging      +---------+---------------+---------+-----------+----------+-------------------+ CFV      Full           Yes      Yes                                      +---------+---------------+---------+-----------+----------+-------------------+ SFJ      Full                                                             +---------+---------------+---------+-----------+----------+-------------------+  FV Prox  Full                                                             +---------+---------------+---------+-----------+----------+-------------------+ FV Mid   Full                                                             +---------+---------------+---------+-----------+----------+-------------------+ FV DistalFull                                                             +---------+---------------+---------+-----------+----------+-------------------+ PFV      Full                                                             +---------+---------------+---------+-----------+----------+-------------------+ POP      Full           Yes      Yes                                      +---------+---------------+---------+-----------+----------+-------------------+ PTV      Full                                                             +---------+---------------+---------+-----------+----------+-------------------+ PERO                                                  Patency shown with                                                        color doppler       +---------+---------------+---------+-----------+----------+-------------------+     Summary: RIGHT: - No evidence of common femoral vein obstruction.  LEFT: - There is no evidence of deep vein thrombosis in the lower extremity. However, portions  of this examination were limited- see technologist comments above.  - No cystic structure found in the popliteal fossa.  *See table(s) above for measurements and observations. Electronically signed by Harold Barban MD on 12/01/2021 at 8:53:38 PM.    Final     Microbiology: Recent Results (from the past 240 hour(s))  Urine Culture  Status: Abnormal   Collection Time: 11/28/21  5:50 PM   Specimen: Urine, Clean Catch  Result Value Ref Range Status   Specimen Description   Final    URINE, CLEAN CATCH Performed at [facility], [address], [place], [place] [address]    Special Requests   Final    NONE Performed at [facility], [address], [place], [place] [address]    Culture >=100,000 COLONIES/mL ESCHERICHIA COLI (A)  Final   Report Status 12/01/2021 FINAL  Final   Organism ID, Bacteria ESCHERICHIA COLI (A)  Final      Susceptibility   Escherichia coli - MIC*    AMPICILLIN 16 INTERMEDIATE Intermediate     CEFAZOLIN <=4 SENSITIVE Sensitive     CEFEPIME <=0.12 SENSITIVE Sensitive     CEFTRIAXONE <=0.25 SENSITIVE Sensitive     CIPROFLOXACIN <=0.25 SENSITIVE Sensitive     GENTAMICIN <=1 SENSITIVE Sensitive     IMIPENEM <=0.25 SENSITIVE Sensitive     NITROFURANTOIN <=16 SENSITIVE Sensitive     TRIMETH/SULFA <=20 SENSITIVE Sensitive     AMPICILLIN/SULBACTAM 4 SENSITIVE Sensitive     PIP/TAZO <=4 SENSITIVE Sensitive     * >=100,000 COLONIES/mL ESCHERICHIA COLI  Culture, blood (Routine X 2) w Reflex to ID Panel     Status: None (Preliminary result)   Collection Time: 11/29/21  2:35 AM   Specimen: BLOOD  Result Value Ref Range Status   Specimen Description   Final    BLOOD BLOOD LEFT HAND Performed at [facility] [address] [place], [place] [address]    Special Requests   Final    BOTTLES DRAWN AEROBIC AND ANAEROBIC Blood Culture adequate volume Performed at [facility] [address] [place], [place] [address]    Culture   Final    NO GROWTH 4 DAYS Performed at [facility], [place] [address] [place], [place] [address]    Report Status PENDING  Incomplete  Culture, blood (Routine X 2) w Reflex to ID Panel     Status: None (Preliminary result)   Collection Time: 11/29/21  2:45 AM   Specimen: BLOOD  Result Value Ref Range Status   Specimen Description   Final    BLOOD BLOOD RIGHT HAND Performed at [facility] [address] [place], [place] [address]    Special Requests   Final    BOTTLES DRAWN AEROBIC ONLY Blood Culture adequate volume Performed at [facility] [address] [place], [place] [address]    Culture   Final    NO GROWTH 4 DAYS Performed at [facility], [place] [address], [place], [place] [address]    Report Status PENDING  Incomplete     Labs: Basic Metabolic Panel: Recent Labs  Lab 11/28/21 2210 11/29/21 0235 11/30/21 0438 12/01/21 0458 12/02/21 0520 12/03/21 0637  NA  --  143 137 136 137 138  K  --  4.6 4.3 4.0 4.6 3.6  CL  --  113* 107 106 108 109  CO2  --  20* 21* '22 25 23  '$ GLUCOSE  --  144* 111* 120* 105* 99  BUN  --  '12 15 14 13 15  '$ CREATININE  --  0.68 0.84 0.74 0.72 0.69  CALCIUM  --  8.4* 8.4* 8.7* 8.4* 8.3*  MG 2.0  --   --   --   --  2.0   Liver Function Tests: Recent Labs  Lab 12/02/21 0520  AST 25  ALT 22  ALKPHOS 165*  BILITOT 0.5  PROT 5.0*  ALBUMIN 2.1*   No results for input(s): LIPASE, AMYLASE in the last 168 hours. No results for input(s): AMMONIA in the last 168 hours. CBC: Recent Labs  Lab 11/28/21 1437 11/29/21 0235 11/30/21 0438 12/01/21 0458 12/02/21 0520 12/03/21 0637  WBC 15.5* 12.3* 12.5* 10.4 7.2 7.8  NEUTROABS 11.2*  --   --   --  4.5 4.8  HGB 12.1 10.8* 10.4* 10.3* 8.8* 9.0*  HCT 37.5 33.2* 32.3* 32.5* 27.8* 29.7*  MCV 97.9 102.8* 103.2* 104.2* 103.3* 105.3*  PLT 258 166 151 166 141* 171   Cardiac Enzymes: No results for  input(s): CKTOTAL, CKMB, CKMBINDEX, TROPONINI in the last 168 hours. BNP: BNP (last 3 results) No results for input(s): BNP in the last 8760 hours.  ProBNP (last 3 results) No results for input(s): PROBNP in the last 8760 hours.  CBG: No results for input(s): GLUCAP in the last 168 hours.  FURTHER DISCHARGE INSTRUCTIONS:   Get Medicines reviewed and adjusted: Please take all your medications with you for your next visit with your Primary MD   Laboratory/radiological data: Please request your Primary MD to go over all hospital tests and procedure/radiological results at the follow up, please ask your Primary MD to get all Hospital records sent to his/her office.   In some cases, they will be blood work, cultures and biopsy results pending at the time of your discharge. Please request that your primary care Heather.D. goes through all the records of your hospital data and follows up on these results.   Also Note the following: If you experience worsening of your admission symptoms, develop shortness of breath, life threatening emergency, suicidal or homicidal thoughts you must seek medical attention immediately by calling 911 or calling your MD immediately  if symptoms less severe.   You must read complete instructions/literature along with all the possible adverse reactions/side effects for all the Medicines you take and that have been prescribed to you. Take any new Medicines after you have completely understood and accpet all the possible adverse reactions/side effects.    Do not drive when taking Pain medications or sleeping medications (Benzodaizepines)   Do not take more than prescribed Pain, Sleep and Anxiety Medications. It is not advisable to combine anxiety,sleep and pain medications without talking with your primary care practitioner   Special Instructions: If you have smoked or chewed Tobacco  in the last 2 yrs please stop smoking, stop any regular Alcohol  and or any Recreational  drug use.   Wear Seat belts while driving.   Please note: You were cared for by a hospitalist during your hospital stay. Once you are discharged, your primary care physician will handle any further medical issues. Please note that NO REFILLS for any discharge medications will be authorized once you are discharged, as it is imperative that you return to your primary care physician (or establish a relationship with a primary care physician if you do not have one) for your post hospital discharge needs so that they can reassess your need for medications and monitor your lab values.     Signed:  Florencia Reasons MD, PhD, FACP  [facility] 12/03/2021, 10:48 AM

## 2021-12-03 NOTE — Progress Notes (Signed)
Report called to Lanette at [facility].

## 2021-12-04 LAB — CULTURE, BLOOD (ROUTINE X 2)
Culture: NO GROWTH
Culture: NO GROWTH
Special Requests: ADEQUATE
Special Requests: ADEQUATE

## 2021-12-06 DIAGNOSIS — B9620 Unspecified Escherichia coli [E. coli] as the cause of diseases classified elsewhere: Secondary | ICD-10-CM | POA: Diagnosis not present

## 2021-12-06 DIAGNOSIS — I2510 Atherosclerotic heart disease of native coronary artery without angina pectoris: Secondary | ICD-10-CM | POA: Diagnosis not present

## 2021-12-06 DIAGNOSIS — J90 Pleural effusion, not elsewhere classified: Secondary | ICD-10-CM | POA: Diagnosis not present

## 2021-12-06 DIAGNOSIS — M6281 Muscle weakness (generalized): Secondary | ICD-10-CM | POA: Diagnosis not present

## 2021-12-08 DIAGNOSIS — J159 Unspecified bacterial pneumonia: Secondary | ICD-10-CM | POA: Diagnosis not present

## 2021-12-09 ENCOUNTER — Emergency Department (HOSPITAL_COMMUNITY): Payer: Medicare PPO

## 2021-12-09 ENCOUNTER — Inpatient Hospital Stay (HOSPITAL_COMMUNITY)
Admission: EM | Admit: 2021-12-09 | Discharge: 2021-12-17 | DRG: 189 | Disposition: A | Discharge status: Skilled Nursing Facility | Payer: Medicare PPO | Source: Skilled Nursing Facility | Attending: Internal Medicine | Admitting: Internal Medicine

## 2021-12-09 ENCOUNTER — Other Ambulatory Visit: Payer: Self-pay

## 2021-12-09 DIAGNOSIS — I5033 Acute on chronic diastolic (congestive) heart failure: Secondary | ICD-10-CM | POA: Diagnosis present

## 2021-12-09 DIAGNOSIS — R0902 Hypoxemia: Principal | ICD-10-CM

## 2021-12-09 DIAGNOSIS — R918 Other nonspecific abnormal finding of lung field: Secondary | ICD-10-CM | POA: Diagnosis not present

## 2021-12-09 DIAGNOSIS — J810 Acute pulmonary edema: Secondary | ICD-10-CM | POA: Diagnosis present

## 2021-12-09 DIAGNOSIS — I5032 Chronic diastolic (congestive) heart failure: Secondary | ICD-10-CM | POA: Diagnosis not present

## 2021-12-09 DIAGNOSIS — D649 Anemia, unspecified: Secondary | ICD-10-CM | POA: Diagnosis present

## 2021-12-09 DIAGNOSIS — R6520 Severe sepsis without septic shock: Secondary | ICD-10-CM | POA: Diagnosis not present

## 2021-12-09 DIAGNOSIS — I2699 Other pulmonary embolism without acute cor pulmonale: Secondary | ICD-10-CM | POA: Diagnosis present

## 2021-12-09 DIAGNOSIS — D638 Anemia in other chronic diseases classified elsewhere: Secondary | ICD-10-CM | POA: Diagnosis not present

## 2021-12-09 DIAGNOSIS — I2510 Atherosclerotic heart disease of native coronary artery without angina pectoris: Secondary | ICD-10-CM | POA: Diagnosis not present

## 2021-12-09 DIAGNOSIS — R778 Other specified abnormalities of plasma proteins: Secondary | ICD-10-CM | POA: Diagnosis not present

## 2021-12-09 DIAGNOSIS — J9601 Acute respiratory failure with hypoxia: Secondary | ICD-10-CM | POA: Diagnosis present

## 2021-12-09 DIAGNOSIS — E785 Hyperlipidemia, unspecified: Secondary | ICD-10-CM | POA: Diagnosis present

## 2021-12-09 DIAGNOSIS — A419 Sepsis, unspecified organism: Secondary | ICD-10-CM | POA: Diagnosis not present

## 2021-12-09 DIAGNOSIS — R0602 Shortness of breath: Secondary | ICD-10-CM

## 2021-12-09 DIAGNOSIS — Z20822 Contact with and (suspected) exposure to covid-19: Secondary | ICD-10-CM | POA: Diagnosis present

## 2021-12-09 DIAGNOSIS — M6281 Muscle weakness (generalized): Secondary | ICD-10-CM | POA: Diagnosis not present

## 2021-12-09 DIAGNOSIS — J189 Pneumonia, unspecified organism: Secondary | ICD-10-CM | POA: Diagnosis present

## 2021-12-09 DIAGNOSIS — Z7401 Bed confinement status: Secondary | ICD-10-CM | POA: Diagnosis not present

## 2021-12-09 DIAGNOSIS — I10 Essential (primary) hypertension: Secondary | ICD-10-CM | POA: Diagnosis not present

## 2021-12-09 DIAGNOSIS — R531 Weakness: Secondary | ICD-10-CM | POA: Diagnosis not present

## 2021-12-09 DIAGNOSIS — R9431 Abnormal electrocardiogram [ECG] [EKG]: Secondary | ICD-10-CM | POA: Diagnosis present

## 2021-12-09 DIAGNOSIS — F32A Depression, unspecified: Secondary | ICD-10-CM | POA: Diagnosis present

## 2021-12-09 DIAGNOSIS — R5383 Other fatigue: Secondary | ICD-10-CM | POA: Diagnosis not present

## 2021-12-09 DIAGNOSIS — R41841 Cognitive communication deficit: Secondary | ICD-10-CM | POA: Diagnosis not present

## 2021-12-09 DIAGNOSIS — N179 Acute kidney failure, unspecified: Secondary | ICD-10-CM | POA: Diagnosis present

## 2021-12-09 DIAGNOSIS — R2681 Unsteadiness on feet: Secondary | ICD-10-CM | POA: Diagnosis not present

## 2021-12-09 DIAGNOSIS — E876 Hypokalemia: Secondary | ICD-10-CM | POA: Diagnosis present

## 2021-12-09 DIAGNOSIS — K219 Gastro-esophageal reflux disease without esophagitis: Secondary | ICD-10-CM | POA: Diagnosis present

## 2021-12-09 DIAGNOSIS — M25562 Pain in left knee: Secondary | ICD-10-CM | POA: Diagnosis not present

## 2021-12-09 DIAGNOSIS — Z87891 Personal history of nicotine dependence: Secondary | ICD-10-CM | POA: Diagnosis not present

## 2021-12-09 DIAGNOSIS — J90 Pleural effusion, not elsewhere classified: Secondary | ICD-10-CM | POA: Diagnosis not present

## 2021-12-09 LAB — CBC WITH DIFFERENTIAL/PLATELET
Abs Immature Granulocytes: 0.22 10*3/uL — ABNORMAL HIGH (ref 0.00–0.07)
Basophils Absolute: 0.1 10*3/uL (ref 0.0–0.1)
Basophils Relative: 1 %
Eosinophils Absolute: 0.5 10*3/uL (ref 0.0–0.5)
Eosinophils Relative: 3 %
HCT: 31.4 % — ABNORMAL LOW (ref 36.0–46.0)
Hemoglobin: 9.9 g/dL — ABNORMAL LOW (ref 12.0–15.0)
Immature Granulocytes: 1 %
Lymphocytes Relative: 9 %
Lymphs Abs: 1.6 10*3/uL (ref 0.7–4.0)
MCH: 32 pg (ref 26.0–34.0)
MCHC: 31.5 g/dL (ref 30.0–36.0)
MCV: 101.6 fL — ABNORMAL HIGH (ref 80.0–100.0)
Monocytes Absolute: 1.2 10*3/uL — ABNORMAL HIGH (ref 0.1–1.0)
Monocytes Relative: 7 %
Neutro Abs: 13.9 10*3/uL — ABNORMAL HIGH (ref 1.7–7.7)
Neutrophils Relative %: 79 %
Platelets: 393 10*3/uL (ref 150–400)
RBC: 3.09 MIL/uL — ABNORMAL LOW (ref 3.87–5.11)
RDW: 14.6 % (ref 11.5–15.5)
WBC: 17.4 10*3/uL — ABNORMAL HIGH (ref 4.0–10.5)
nRBC: 0 % (ref 0.0–0.2)

## 2021-12-09 IMAGING — DX DG CHEST 1V PORT
1 series · 1 of 1 positions shown · non-contrast
Comparison: [DATE]

CLINICAL DATA: Shortness of breath

EXAM:
PORTABLE CHEST 1 VIEW

[chest ap]
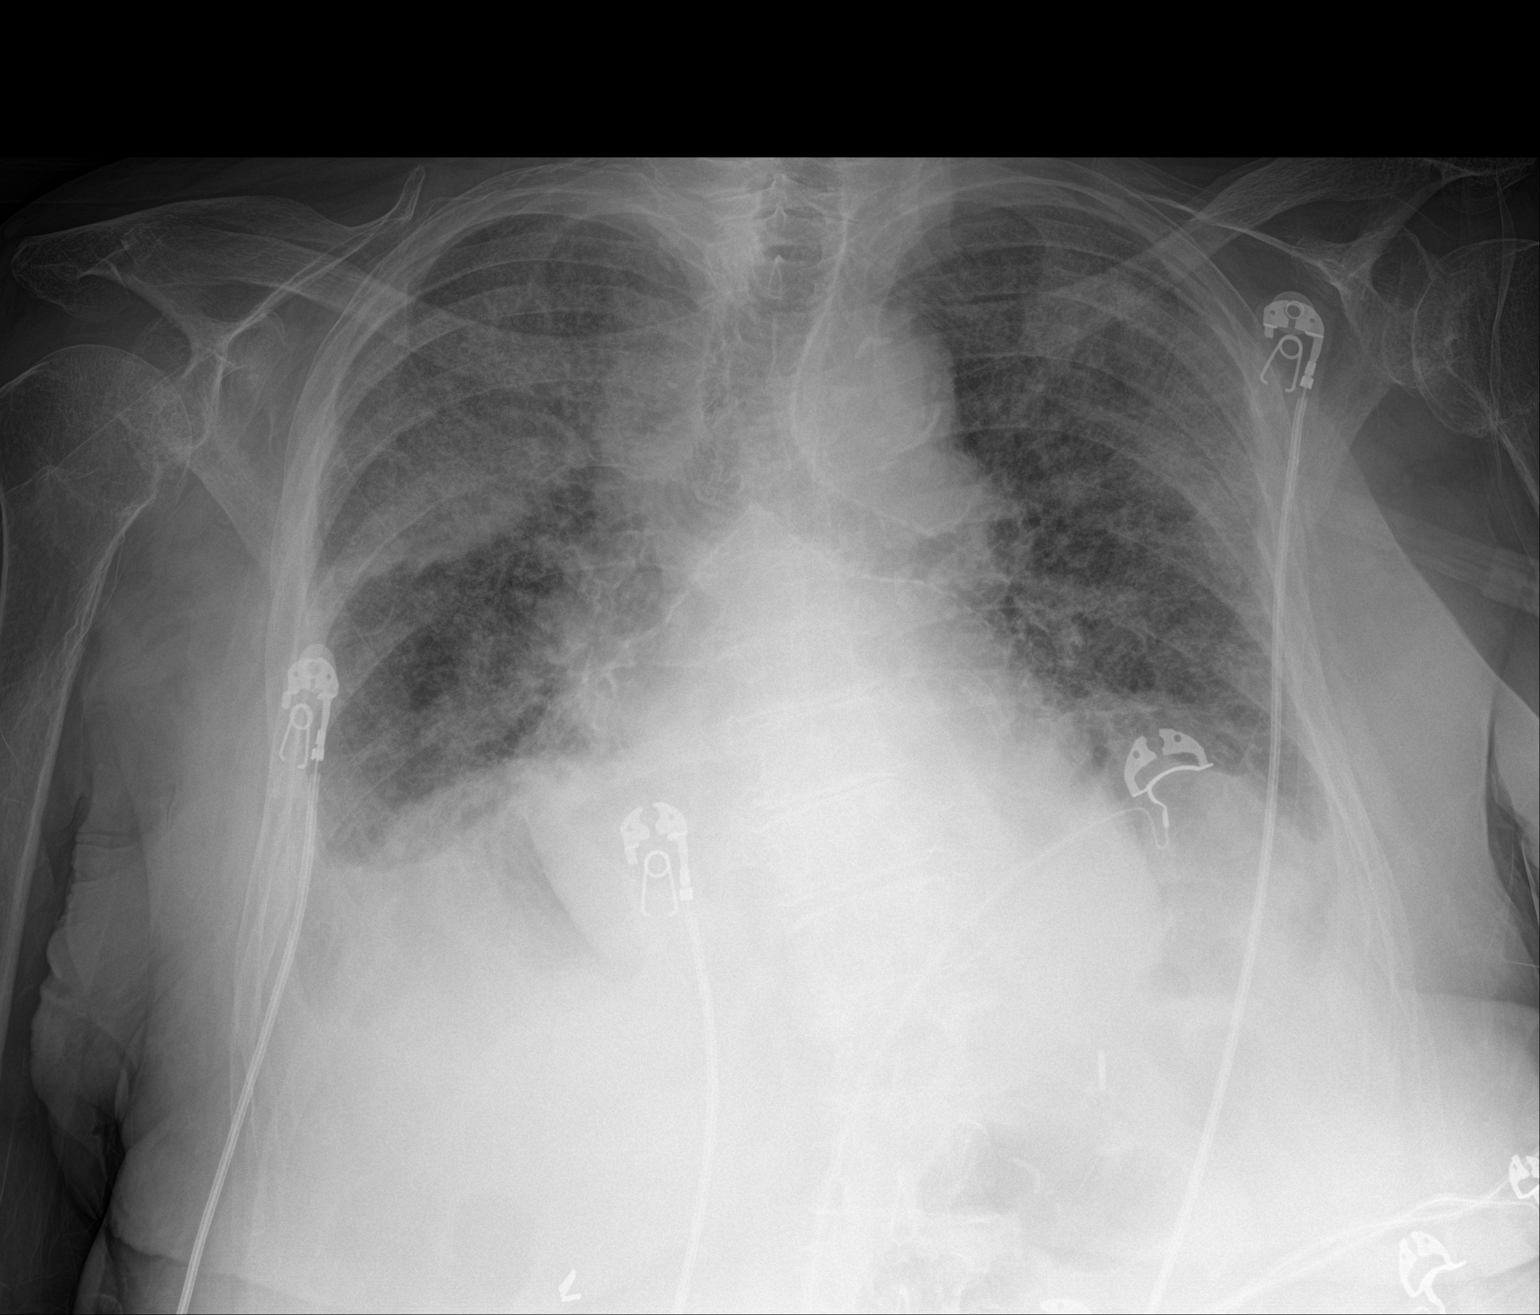

[1 of 1 positions shown; findings below may reference images not displayed]

FINDINGS: Diffuse bilateral interstitial and airspace disease with
cardiomegaly. Findings likely reflect edema/CHF. Suspect small
bilateral effusions. No acute bony abnormality.
IMPRESSION: Cardiomegaly with diffuse interstitial and alveolar opacities,
likely CHF.

Small effusions.

## 2021-12-09 MED ORDER — VANCOMYCIN HCL IN DEXTROSE 1-5 GM/200ML-% IV SOLN
1000.0000 mg | Freq: Once | INTRAVENOUS | Status: AC
Start: 2021-12-09 — End: 2021-12-10
  Administered 2021-12-09: 1000 mg via INTRAVENOUS
  Filled 2021-12-09: qty 200

## 2021-12-09 MED ORDER — SODIUM CHLORIDE 0.9 % IV SOLN
2.0000 g | Freq: Two times a day (BID) | INTRAVENOUS | Status: AC
  Administered 2021-12-10 – 2021-12-14 (×10): 2 g via INTRAVENOUS
  Filled 2021-12-09 (×11): qty 12.5

## 2021-12-09 MED ORDER — FENTANYL CITRATE PF 50 MCG/ML IJ SOSY
50.0000 ug | PREFILLED_SYRINGE | Freq: Once | INTRAMUSCULAR | Status: AC
  Administered 2021-12-09: 50 ug via INTRAVENOUS
  Filled 2021-12-09: qty 1

## 2021-12-09 MED ORDER — VANCOMYCIN HCL 750 MG/150ML IV SOLN: 750.0000 mg | INTRAVENOUS | Status: DC

## 2021-12-09 MED ORDER — SODIUM CHLORIDE 0.9 % IV SOLN
2.0000 g | Freq: Once | INTRAVENOUS | Status: AC
  Administered 2021-12-09: 2 g via INTRAVENOUS
  Filled 2021-12-09: qty 12.5

## 2021-12-09 NOTE — Progress Notes (Signed)
Pharmacy Antibiotic Note  Heather Garrett is a 71 y.o. female admitted on 12/09/2021 with with a past medical history significant for recent admission for UTI and pneumonia currently on antibiotics, prolonged QT syndrome, previous MI, previous bladder and breast cancer, gastric bypass surgery, hyperlipidemia, hypertension, and migraines who presents with hypoxia, fatigue, chills, shortness of breath, and worsening cough.  Pharmacy has been consulted for vancomycin and cefepime dosing.  Plan: Vancomycin 1gm IV x1 then '750mg'$  q24h (AUC 514.7, used Scr 0.8 Cefepime 2gm IV q12h Follow renal function, cultures and clinical course     Temp (24hrs), Avg:97.8 F (36.6 C), Min:97.8 F (36.6 C), Max:97.8 F (36.6 C)  Recent Labs  Lab 12/03/21 0637  WBC 7.8  CREATININE 0.69    Estimated Creatinine Clearance: 45.7 mL/min (by C-G formula based on SCr of 0.69 mg/dL).    Allergies  Allergen Reactions   Contrast Media [Iodinated Contrast Media] Anaphylaxis and Hives   Ciprofloxacin Other (See Comments)    Interaction w other medicine   Erythromycin Base Nausea And Vomiting   Sulfabenzamide Other (See Comments)    UNK reaction    Antimicrobials this admission: 6/1 vanc >> 6/1 cefepime >>  Dose adjustments this admission:   Microbiology results:   Thank you for allowing pharmacy to be a part of this patient's care.  Dolly Rias RPh 12/09/2021, 11:03 PM

## 2021-12-09 NOTE — ED Notes (Signed)
XR at bedside

## 2021-12-09 NOTE — ED Provider Notes (Signed)
[facility] DEPT Provider Note   CSN: [id number] Arrival date & time: 12/09/21  2138     History  Chief Complaint  Patient presents with   Shortness of Breath    Heather Garrett is a 71 y.o. female.  The history is provided by the patient and medical records. No language interpreter was used.  Shortness of Breath Severity:  Severe Onset quality:  Gradual Duration:  1 day Timing:  Constant Progression:  Worsening Chronicity:  Recurrent Context: URI   Relieved by:  Nothing Worsened by:  Deep breathing Ineffective treatments:  None tried Associated symptoms: chest pain (tightness) and cough   Associated symptoms: no abdominal pain, no diaphoresis, no fever, no headaches, no neck pain, no rash, no sputum production, no vomiting and no wheezing       Home Medications Prior to Admission medications   Medication Sig Start Date End Date Taking? Authorizing Provider  aspirin 81 MG EC tablet Take 1 tablet (81 mg total) by mouth every other day. Swallow whole.  Hold aspirin while taking eliquis, Patient taking differently: Take 81 mg by mouth daily. Swallow whole. 09/10/21   Florencia Reasons, MD  carvedilol (COREG) 3.125 MG tablet Take 1 tablet (3.125 mg total) by mouth 2 (two) times daily. Hold if sbp less than 100 or heart rate less than 50 12/03/21   Florencia Reasons, MD  Cholecalciferol 25 MCG (1000 UT) tablet Take 1,000 Units by mouth daily.    [provider]  ezetimibe-simvastatin (VYTORIN) 10-80 MG tablet Take 1 tablet by mouth at bedtime.    [provider]  ferrous sulfate 325 (65 FE) MG EC tablet Take 1 tablet (325 mg total) by mouth every Monday, Wednesday, and Friday. 09/10/21 11/29/21  Florencia Reasons, MD  folic acid (FOLVITE) 1 MG tablet Take 1 tablet (1 mg total) by mouth daily. 12/03/21 12/03/22  Florencia Reasons, MD  hydrochlorothiazide (HYDRODIURIL) 25 MG tablet Take 25 mg by mouth daily as needed (swelling/fluid).    [provider]   lidocaine (XYLOCAINE) 2 % solution Use as directed 15 mLs in the mouth or throat every 6 (six) hours as needed for mouth pain. 12/03/21   Florencia Reasons, MD  lidocaine (XYLOCAINE) 5 % ointment Apply 1 application topically as needed. 06/30/21   Long, Wonda Olds, MD  NURTEC 75 MG TBDP Take 75 mg by mouth daily as needed for headache. 05/19/21   [provider]  oxyCODONE (OXY IR/ROXICODONE) 5 MG immediate release tablet Take 1 tablet (5 mg total) by mouth every 4 (four) hours as needed for severe pain. 12/03/21   Florencia Reasons, MD  pantoprazole (PROTONIX) 40 MG tablet Take 1 tablet (40 mg total) by mouth 2 (two) times daily before a meal. 11/01/21   Vladimir Crofts, PA-C  polyethylene glycol (MIRALAX / GLYCOLAX) 17 g packet Take 17 g by mouth daily. Patient taking differently: Take 17 g by mouth daily as needed for mild constipation. 09/11/21   Florencia Reasons, MD  Polyethylene Glycol 400 (BLINK TEARS) 0.25 % SOLN Place 1 drop into both eyes daily as needed (dry eyes).    [provider]  senna-docusate (SENOKOT-S) 8.6-50 MG tablet Take 1 tablet by mouth at bedtime. 09/10/21   Florencia Reasons, MD  sertraline (ZOLOFT) 100 MG tablet Take 100 mg by mouth daily.    [provider]  thiamine 100 MG tablet Take 1 tablet (100 mg total) by mouth daily. 12/03/21   Florencia Reasons, MD  tiZANidine (ZANAFLEX)  4 MG tablet Take 4 mg by mouth at bedtime.    [provider]  valsartan (DIOVAN) 320 MG tablet Take 320 mg by mouth daily. 11/28/21   [provider]  vitamin B-12 (CYANOCOBALAMIN) 1000 MCG tablet Take 1,000 mcg by mouth daily.    [provider]      Allergies    Contrast media [iodinated contrast media], Ciprofloxacin, Erythromycin base, and Sulfabenzamide    Review of Systems   Review of Systems  Constitutional:  Positive for chills and fatigue. Negative for diaphoresis and fever.  HENT:  Negative for congestion.   Respiratory:  Positive for cough, chest tightness and shortness of  breath. Negative for sputum production, wheezing and stridor.   Cardiovascular:  Positive for chest pain (tightness) and leg swelling. Negative for palpitations.  Gastrointestinal:  Positive for diarrhea. Negative for abdominal pain, constipation, nausea and vomiting.  Genitourinary:  Negative for flank pain.  Musculoskeletal:  Negative for back pain, neck pain and neck stiffness.  Skin:  Negative for rash and wound.  Neurological:  Negative for light-headedness and headaches.  Psychiatric/Behavioral:  Negative for agitation.   All other systems reviewed and are negative.  Physical Exam Updated Vital Signs BP (!) 147/94   Pulse 80   Temp 97.8 F (36.6 C) (Oral)   Resp (!) 32   SpO2 92%  Physical Exam Vitals and nursing note reviewed.  Constitutional:      General: She is not in acute distress.    Appearance: She is well-developed. She is ill-appearing. She is not toxic-appearing or diaphoretic.  HENT:     Head: Normocephalic and atraumatic.  Eyes:     Extraocular Movements: Extraocular movements intact.     Conjunctiva/sclera: Conjunctivae normal.     Pupils: Pupils are equal, round, and reactive to light.  Cardiovascular:     Rate and Rhythm: Normal rate and regular rhythm.     Heart sounds: No murmur heard. Pulmonary:     Effort: Pulmonary effort is normal. Tachypnea present. No respiratory distress.     Breath sounds: Rhonchi and rales present. No wheezing.  Chest:     Chest wall: No tenderness.  Abdominal:     Palpations: Abdomen is soft.     Tenderness: There is no abdominal tenderness.  Musculoskeletal:        General: No swelling.     Cervical back: Neck supple.     Right lower leg: No tenderness. Edema present.     Left lower leg: No tenderness. Edema present.  Skin:    General: Skin is warm and dry.     Capillary Refill: Capillary refill takes less than 2 seconds.     Findings: No erythema or rash.  Neurological:     General: No focal deficit present.      Mental Status: She is alert.    ED Results / Procedures / Treatments   Labs (all labs ordered are listed, but only abnormal results are displayed) Labs Reviewed  SARS CORONAVIRUS 2 BY RT PCR  CBC WITH DIFFERENTIAL/PLATELET  COMPREHENSIVE METABOLIC PANEL  LACTIC ACID, PLASMA  LACTIC ACID, PLASMA  LIPASE, BLOOD  BRAIN NATRIURETIC PEPTIDE  TROPONIN I (HIGH SENSITIVITY)    EKG EKG Interpretation  Date/Time:  Thursday December 09 2021 21:52:07 EDT Ventricular Rate:  79 PR Interval:  112 QRS Duration: 96 QT Interval:  482 QTC Calculation: 553 R Axis:   3 Text Interpretation: Sinus rhythm Borderline short PR interval Abnormal R-wave progression, early transition Borderline repolarization  abnormality Prolonged QT interval when compared to prior, similar appearance with less t wave inversion in leads V1 and V2. No STEMI Confirmed by Antony Blackbird [phone]) on 12/09/2021 10:13:18 PM  Radiology DG Chest Portable 1 View  Result Date: 12/09/2021 CLINICAL DATA:  Shortness of breath EXAM: PORTABLE CHEST 1 VIEW COMPARISON:  12/01/2021 FINDINGS: Diffuse bilateral interstitial and airspace disease with cardiomegaly. Findings likely reflect edema/CHF. Suspect small bilateral effusions. No acute bony abnormality. IMPRESSION: Cardiomegaly with diffuse interstitial and alveolar opacities, likely CHF. Small effusions. Electronically Signed   By: Rolm Baptise M.D.   On: 12/09/2021 22:09    Procedures Procedures    CRITICAL CARE Performed by: Gwenyth Allegra Litzy Dicker Total critical care time: 35 minutes Critical care time was exclusive of separately billable procedures and treating other patients. Critical care was necessary to treat or prevent imminent or life-threatening deterioration. Critical care was time spent personally by me on the following activities: development of treatment plan with patient and/or surrogate as well as nursing, discussions with consultants, evaluation of patient's response to  treatment, examination of patient, obtaining history from patient or surrogate, ordering and performing treatments and interventions, ordering and review of laboratory studies, ordering and review of radiographic studies, pulse oximetry and re-evaluation of patient's condition.   Medications Ordered in ED Medications  vancomycin (VANCOCIN) IVPB 1000 mg/200 mL premix (has no administration in time range)  ceFEPIme (MAXIPIME) 2 g in sodium chloride 0.9 % 100 mL IVPB (2 g Intravenous New Bag/Given 12/09/21 2302)  ceFEPIme (MAXIPIME) 2 g in sodium chloride 0.9 % 100 mL IVPB (has no administration in time range)  vancomycin (VANCOREADY) IVPB 750 mg/150 mL (has no administration in time range)  fentaNYL (SUBLIMAZE) injection 50 mcg (has no administration in time range)    ED Course/ Medical Decision Making/ A&P                           Medical Decision Making Amount and/or Complexity of Data Reviewed Labs: ordered. Radiology: ordered.  Risk Prescription drug management.    QUANTINA DERSHEM is a 71 y.o. female with a past medical history significant for recent admission for UTI and pneumonia currently on antibiotics, prolonged QT syndrome, previous MI, previous bladder and breast cancer, gastric bypass surgery, hyperlipidemia, hypertension, and migraines who presents with hypoxia, fatigue, chills, shortness of breath, and worsening cough.  According to patient, she woke up this morning and was having oxygen saturations in the 50s despite being on several liters of oxygen.  She reports she is not normally on oxygen but was escalating it throughout the day but she is still remained hypoxic.  She reports some chest tightness but no chest pain reported.  She reports she is having a continued dry cough.  She reports no fevers but has occasional chills.  She reports no nausea, vomiting, constipation.  She reports some mild diarrhea but not today and she also says that her urine has improved since being  on the antibiotics at discharge.  The urine is still dark but not quite as dark or infected looking.  She denies any back or flank pain.  Denies any rashes.  Denies any headache, neck pain, or neck stiffness.  On arrival, patient had to be escalated to 12 L high flow nasal cannula to maintain oxygen saturations in the 90s.  On my exam, lungs are coarse bilaterally.  Chest and abdomen were nontender.  Flanks nontender.  Legs are edematous and patient  reports that they are similar to how they have been.  Patient says that she was having more pain in her left leg but that has improved.  She reports he had a negative ultrasound last time she was in the hospital.  She denies history of DVT or PE.  Patient remains tachypneic but is nontachycardic and is now maintaining oxygen saturations in the 90s on the 12 L high flow nasal cannula.  She is afebrile and is not hypotensive.  EKG does not show STEMI.  Clinically I am concerned about worsening pneumonia, fluid overload in the lungs, cardiac cause of symptoms, or even a pulmonary embolism.  Unfortunately, patient does have a contrast allergy that is anaphylaxis so she will need a VQ scan.  Do not believe there is a V/Q option overnight in the emergency department so we will order it to likely be done tomorrow.  Due to patient's allergies, intolerances, prolonged QT, and recent antibiotics, I spoke to pharmacy about best antibiotic regimen for what I suspect is worsened pneumonia with the opacities on x-ray and exam with rhonchi.  They recommended vancomycin and cefepime as she cannot tolerate azithromycin or other QT prolonging agents.  We will get screening labs but she will need admission for further management.  11:07 PM Care be transferred to oncoming team to await work-up to be completed.  She will need admission for suspected worsening pneumonia, hypoxia, and increased oxygen requirement.  She will also likely need VQ scan tomorrow which I have already  ordered due to her contrast allergy with anaphylaxis.  She has received broad-spectrum antibiotics after discussion with pharmacy and she will be admitted after labs have been drawn and finalized.         Final Clinical Impression(s) / ED Diagnoses Final diagnoses:  Hypoxia  SOB (shortness of breath)  Community acquired pneumonia, unspecified laterality  Acute pulmonary edema (HCC)    Clinical Impression: 1. Hypoxia   2. SOB (shortness of breath)   3. Community acquired pneumonia, unspecified laterality   4. Acute pulmonary edema (HCC)     Disposition: Admit  This note was prepared with assistance of Dragon voice recognition software. Occasional wrong-word or sound-a-like substitutions may have occurred due to the inherent limitations of voice recognition software.      Giorgio Chabot, Gwenyth Allegra, MD 12/09/21 [phone]

## 2021-12-09 NOTE — ED Notes (Signed)
RT at bedside.

## 2021-12-09 NOTE — ED Triage Notes (Signed)
Pt BIB [facility] form [facility] d/t ongoing SOB. SOB ongoing for 3 days, was dx with pneumonia yesterday, SOB worsening today. Staff noticed decreased in SpO2 while PT today as low as 70s. SpO2 improved with rest. Tonight pt SpO2 continued to decrease despite supplemental oxygen. Currently simple mask 10L SpO2 92% with labored breathing. Denies CP/Dizziness. Pt not typically on oxygen.

## 2021-12-09 NOTE — ED Provider Notes (Signed)
Care assumed from Dr. Sherry Ruffing, patient with worsening dyspnea, chest x-ray suspicious for pneumonia. She currently is requiring high flow oxygen (12 liters/minute). Labs are pending, will need to be admitted.  I have reviewed and interpreted all of the laboratory test results.  There is a significant leukocytosis with slight left shift, consistent with pneumonia.  Macrocytic anemia is present, not significantly changed from baseline.  Comprehensive metabolic panel shows an elevated alkaline phosphatase which is unchanged from prior.  BNP is significantly elevated at 900.3 consistent with a component of heart failure.  Troponin is mildly elevated, likely secondary to demand ischemia.  Case is discussed with Dr. Velia Meyer of [facility], who agrees to admit the patient.  Results for orders placed or performed during the hospital encounter of 12/09/21  SARS Coronavirus 2 by RT PCR (hospital order, performed in [facility] hospital lab) *cepheid single result test* Anterior Nasal Swab   Specimen: Anterior Nasal Swab  Result Value Ref Range   SARS Coronavirus 2 by RT PCR NEGATIVE NEGATIVE  CBC with Differential  Result Value Ref Range   WBC 17.4 (H) 4.0 - 10.5 K/uL   RBC 3.09 (L) 3.87 - 5.11 MIL/uL   Hemoglobin 9.9 (L) 12.0 - 15.0 g/dL   HCT 31.4 (L) 36.0 - 46.0 %   MCV 101.6 (H) 80.0 - 100.0 fL   MCH 32.0 26.0 - 34.0 pg   MCHC 31.5 30.0 - 36.0 g/dL   RDW 14.6 11.5 - 15.5 %   Platelets 393 150 - 400 K/uL   nRBC 0.0 0.0 - 0.2 %   Neutrophils Relative % 79 %   Neutro Abs 13.9 (H) 1.7 - 7.7 K/uL   Lymphocytes Relative 9 %   Lymphs Abs 1.6 0.7 - 4.0 K/uL   Monocytes Relative 7 %   Monocytes Absolute 1.2 (H) 0.1 - 1.0 K/uL   Eosinophils Relative 3 %   Eosinophils Absolute 0.5 0.0 - 0.5 K/uL   Basophils Relative 1 %   Basophils Absolute 0.1 0.0 - 0.1 K/uL   Immature Granulocytes 1 %   Abs Immature Granulocytes 0.22 (H) 0.00 - 0.07 K/uL  Comprehensive metabolic panel  Result Value Ref  Range   Sodium 139 135 - 145 mmol/L   Potassium 3.7 3.5 - 5.1 mmol/L   Chloride 110 98 - 111 mmol/L   CO2 22 22 - 32 mmol/L   Glucose, Bld 117 (H) 70 - 99 mg/dL   BUN 21 8 - 23 mg/dL   Creatinine, Ser 0.82 0.44 - 1.00 mg/dL   Calcium 8.4 (L) 8.9 - 10.3 mg/dL   Total Protein 5.7 (L) 6.5 - 8.1 g/dL   Albumin 2.3 (L) 3.5 - 5.0 g/dL   AST 23 15 - 41 U/L   ALT 14 0 - 44 U/L   Alkaline Phosphatase 160 (H) 38 - 126 U/L   Total Bilirubin 0.5 0.3 - 1.2 mg/dL   GFR, Estimated >60 >60 mL/min   Anion gap 7 5 - 15  Lactic acid, plasma  Result Value Ref Range   Lactic Acid, Venous 1.2 0.5 - 1.9 mmol/L  Lipase, blood  Result Value Ref Range   Lipase 22 11 - 51 U/L  Brain natriuretic peptide  Result Value Ref Range   B Natriuretic Peptide 900.3 (H) 0.0 - 100.0 pg/mL  Troponin I (High Sensitivity)  Result Value Ref Range   Troponin I (High Sensitivity) 34 (H) <16 ng/L       Delora Fuel, MD 10/96/04 0310

## 2021-12-10 ENCOUNTER — Inpatient Hospital Stay (HOSPITAL_COMMUNITY): Payer: Medicare PPO

## 2021-12-10 ENCOUNTER — Encounter (HOSPITAL_COMMUNITY): Payer: Self-pay | Admitting: Internal Medicine

## 2021-12-10 DIAGNOSIS — K219 Gastro-esophageal reflux disease without esophagitis: Secondary | ICD-10-CM | POA: Diagnosis present

## 2021-12-10 DIAGNOSIS — R9431 Abnormal electrocardiogram [ECG] [EKG]: Secondary | ICD-10-CM

## 2021-12-10 DIAGNOSIS — J189 Pneumonia, unspecified organism: Secondary | ICD-10-CM | POA: Diagnosis present

## 2021-12-10 DIAGNOSIS — Z20822 Contact with and (suspected) exposure to covid-19: Secondary | ICD-10-CM | POA: Diagnosis present

## 2021-12-10 DIAGNOSIS — R778 Other specified abnormalities of plasma proteins: Secondary | ICD-10-CM | POA: Diagnosis not present

## 2021-12-10 DIAGNOSIS — E876 Hypokalemia: Secondary | ICD-10-CM | POA: Diagnosis present

## 2021-12-10 DIAGNOSIS — I2699 Other pulmonary embolism without acute cor pulmonale: Secondary | ICD-10-CM | POA: Diagnosis present

## 2021-12-10 DIAGNOSIS — Z87891 Personal history of nicotine dependence: Secondary | ICD-10-CM | POA: Diagnosis not present

## 2021-12-10 DIAGNOSIS — F32A Depression, unspecified: Secondary | ICD-10-CM | POA: Diagnosis present

## 2021-12-10 DIAGNOSIS — J9601 Acute respiratory failure with hypoxia: Secondary | ICD-10-CM

## 2021-12-10 DIAGNOSIS — D649 Anemia, unspecified: Secondary | ICD-10-CM | POA: Diagnosis present

## 2021-12-10 DIAGNOSIS — I5033 Acute on chronic diastolic (congestive) heart failure: Secondary | ICD-10-CM | POA: Diagnosis present

## 2021-12-10 DIAGNOSIS — A419 Sepsis, unspecified organism: Secondary | ICD-10-CM | POA: Diagnosis present

## 2021-12-10 DIAGNOSIS — I5032 Chronic diastolic (congestive) heart failure: Secondary | ICD-10-CM | POA: Diagnosis not present

## 2021-12-10 DIAGNOSIS — N179 Acute kidney failure, unspecified: Secondary | ICD-10-CM | POA: Diagnosis present

## 2021-12-10 DIAGNOSIS — E785 Hyperlipidemia, unspecified: Secondary | ICD-10-CM | POA: Diagnosis present

## 2021-12-10 DIAGNOSIS — J810 Acute pulmonary edema: Secondary | ICD-10-CM | POA: Diagnosis present

## 2021-12-10 DIAGNOSIS — R6520 Severe sepsis without septic shock: Secondary | ICD-10-CM

## 2021-12-10 LAB — COMPREHENSIVE METABOLIC PANEL
ALT: 14 U/L (ref 0–44)
ALT: 15 U/L (ref 0–44)
AST: 23 U/L (ref 15–41)
AST: 26 U/L (ref 15–41)
Albumin: 2.3 g/dL — ABNORMAL LOW (ref 3.5–5.0)
Albumin: 2.4 g/dL — ABNORMAL LOW (ref 3.5–5.0)
Alkaline Phosphatase: 160 U/L — ABNORMAL HIGH (ref 38–126)
Alkaline Phosphatase: 176 U/L — ABNORMAL HIGH (ref 38–126)
Anion gap: 7 (ref 5–15)
Anion gap: 7 (ref 5–15)
BUN: 21 mg/dL (ref 8–23)
BUN: 21 mg/dL (ref 8–23)
CO2: 22 mmol/L (ref 22–32)
CO2: 25 mmol/L (ref 22–32)
Calcium: 8.4 mg/dL — ABNORMAL LOW (ref 8.9–10.3)
Calcium: 8.5 mg/dL — ABNORMAL LOW (ref 8.9–10.3)
Chloride: 107 mmol/L (ref 98–111)
Chloride: 110 mmol/L (ref 98–111)
Creatinine, Ser: 0.8 mg/dL (ref 0.44–1.00)
Creatinine, Ser: 0.82 mg/dL (ref 0.44–1.00)
GFR, Estimated: >60 mL/min (ref >60)
GFR, Estimated: >60 mL/min (ref >60)
Glucose, Bld: 101 mg/dL — ABNORMAL HIGH (ref 70–99)
Glucose, Bld: 117 mg/dL — ABNORMAL HIGH (ref 70–99)
Potassium: 3.6 mmol/L (ref 3.5–5.1)
Potassium: 3.7 mmol/L (ref 3.5–5.1)
Sodium: 139 mmol/L (ref 135–145)
Sodium: 139 mmol/L (ref 135–145)
Total Bilirubin: 0.5 mg/dL (ref 0.3–1.2)
Total Bilirubin: 0.6 mg/dL (ref 0.3–1.2)
Total Protein: 5.7 g/dL — ABNORMAL LOW (ref 6.5–8.1)
Total Protein: 6.1 g/dL — ABNORMAL LOW (ref 6.5–8.1)

## 2021-12-10 LAB — URINALYSIS, COMPLETE (UACMP) WITH MICROSCOPIC
Bilirubin Urine: NEGATIVE
Glucose, UA: NEGATIVE mg/dL
Hgb urine dipstick: NEGATIVE
Ketones, ur: NEGATIVE mg/dL
Nitrite: NEGATIVE
Protein, ur: 30 mg/dL — AB
Specific Gravity, Urine: 1.028 (ref 1.005–1.030)
pH: 5 (ref 5.0–8.0)

## 2021-12-10 LAB — CBC WITH DIFFERENTIAL/PLATELET
Abs Immature Granulocytes: 0.14 10*3/uL — ABNORMAL HIGH (ref 0.00–0.07)
Basophils Absolute: 0.1 10*3/uL (ref 0.0–0.1)
Basophils Relative: 1 %
Eosinophils Absolute: 0.7 10*3/uL — ABNORMAL HIGH (ref 0.0–0.5)
Eosinophils Relative: 4 %
HCT: 33.2 % — ABNORMAL LOW (ref 36.0–46.0)
Hemoglobin: 10.3 g/dL — ABNORMAL LOW (ref 12.0–15.0)
Immature Granulocytes: 1 %
Lymphocytes Relative: 10 %
Lymphs Abs: 1.7 10*3/uL (ref 0.7–4.0)
MCH: 31.6 pg (ref 26.0–34.0)
MCHC: 31 g/dL (ref 30.0–36.0)
MCV: 101.8 fL — ABNORMAL HIGH (ref 80.0–100.0)
Monocytes Absolute: 1.2 10*3/uL — ABNORMAL HIGH (ref 0.1–1.0)
Monocytes Relative: 7 %
Neutro Abs: 13.4 10*3/uL — ABNORMAL HIGH (ref 1.7–7.7)
Neutrophils Relative %: 77 %
Platelets: 405 10*3/uL — ABNORMAL HIGH (ref 150–400)
RBC: 3.26 MIL/uL — ABNORMAL LOW (ref 3.87–5.11)
RDW: 14.6 % (ref 11.5–15.5)
WBC: 17.2 10*3/uL — ABNORMAL HIGH (ref 4.0–10.5)
nRBC: 0 % (ref 0.0–0.2)

## 2021-12-10 LAB — ECHOCARDIOGRAM COMPLETE
AR max vel: 1.62 cm2
AV Area VTI: 1.59 cm2
AV Area mean vel: 1.58 cm2
AV Mean grad: 5.7 mmHg
AV Peak grad: 10.7 mmHg
Ao pk vel: 1.64 m/s
Area-P 1/2: 3.68 cm2
Height: 56 in
S' Lateral: 2.2 cm
Single Plane A4C EF: 71 %
Weight: 2021.18 oz

## 2021-12-10 LAB — BLOOD GAS, VENOUS
Acid-Base Excess: 0.6 mmol/L (ref 0.0–2.0)
Bicarbonate: 26 mmol/L (ref 20.0–28.0)
O2 Saturation: 49.2 %
Patient temperature: 37
pCO2, Ven: 44 mmHg (ref 44–60)
pH, Ven: 7.38 (ref 7.25–7.43)
pO2, Ven: 33 mmHg (ref 32–45)

## 2021-12-10 LAB — BRAIN NATRIURETIC PEPTIDE: B Natriuretic Peptide: 900.3 pg/mL — ABNORMAL HIGH (ref 0.0–100.0)

## 2021-12-10 LAB — MAGNESIUM: Magnesium: 2 mg/dL (ref 1.7–2.4)

## 2021-12-10 LAB — STREP PNEUMONIAE URINARY ANTIGEN: Strep Pneumo Urinary Antigen: NEGATIVE

## 2021-12-10 LAB — TROPONIN I (HIGH SENSITIVITY)
Troponin I (High Sensitivity): 32 ng/L — ABNORMAL HIGH (ref <18)
Troponin I (High Sensitivity): 32 ng/L — ABNORMAL HIGH (ref <18)
Troponin I (High Sensitivity): 34 ng/L — ABNORMAL HIGH (ref <18)

## 2021-12-10 LAB — PROCALCITONIN: Procalcitonin: 0.15 ng/mL

## 2021-12-10 LAB — GLUCOSE, CAPILLARY: Glucose-Capillary: 104 mg/dL — ABNORMAL HIGH (ref 70–99)

## 2021-12-10 LAB — PROTIME-INR
INR: 1.2 (ref 0.8–1.2)
Prothrombin Time: 14.6 seconds (ref 11.4–15.2)

## 2021-12-10 LAB — APTT: aPTT: 36 seconds (ref 24–36)

## 2021-12-10 LAB — HEPARIN LEVEL (UNFRACTIONATED): Heparin Unfractionated: <0.1 IU/mL — ABNORMAL LOW (ref 0.30–0.70)

## 2021-12-10 LAB — PHOSPHORUS: Phosphorus: 3.6 mg/dL (ref 2.5–4.6)

## 2021-12-10 LAB — SARS CORONAVIRUS 2 BY RT PCR: SARS Coronavirus 2 by RT PCR: NEGATIVE

## 2021-12-10 LAB — LIPASE, BLOOD: Lipase: 22 U/L (ref 11–51)

## 2021-12-10 LAB — MRSA NEXT GEN BY PCR, NASAL: MRSA by PCR Next Gen: NOT DETECTED

## 2021-12-10 LAB — LACTIC ACID, PLASMA: Lactic Acid, Venous: 1.2 mmol/L (ref 0.5–1.9)

## 2021-12-10 MED ORDER — FUROSEMIDE 10 MG/ML IJ SOLN
60.0000 mg | Freq: Two times a day (BID) | INTRAMUSCULAR | Status: DC
Start: 2021-12-10 — End: 2021-12-10
  Administered 2021-12-10 (×2): 60 mg via INTRAVENOUS
  Filled 2021-12-10 (×2): qty 6

## 2021-12-10 MED ORDER — FUROSEMIDE 10 MG/ML IJ SOLN
20.0000 mg | Freq: Once | INTRAMUSCULAR | Status: AC
  Administered 2021-12-10: 20 mg via INTRAVENOUS
  Filled 2021-12-10: qty 4

## 2021-12-10 MED ORDER — TECHNETIUM TO 99M ALBUMIN AGGREGATED
4.2000 | Freq: Once | INTRAVENOUS | Status: AC
  Administered 2021-12-10: 4.2 via INTRAVENOUS

## 2021-12-10 MED ORDER — HEPARIN BOLUS VIA INFUSION
3000.0000 [IU] | Freq: Once | INTRAVENOUS | Status: AC
Start: 2021-12-10 — End: 2021-12-10
  Administered 2021-12-10: 3000 [IU] via INTRAVENOUS
  Filled 2021-12-10: qty 3000

## 2021-12-10 MED ORDER — ORAL CARE MOUTH RINSE
15.0000 mL | Freq: Two times a day (BID) | OROMUCOSAL | Status: DC
  Administered 2021-12-10 – 2021-12-17 (×6): 15 mL via OROMUCOSAL

## 2021-12-10 MED ORDER — PANTOPRAZOLE SODIUM 40 MG PO TBEC
40.0000 mg | DELAYED_RELEASE_TABLET | Freq: Two times a day (BID) | ORAL | Status: DC
  Administered 2021-12-10 – 2021-12-17 (×15): 40 mg via ORAL
  Filled 2021-12-10 (×15): qty 1

## 2021-12-10 MED ORDER — ACETAMINOPHEN 650 MG RE SUPP: 650.0000 mg | Freq: Four times a day (QID) | RECTAL | Status: DC | PRN

## 2021-12-10 MED ORDER — ACETAMINOPHEN 325 MG PO TABS
650.0000 mg | ORAL_TABLET | Freq: Four times a day (QID) | ORAL | Status: DC | PRN
  Administered 2021-12-10 – 2021-12-17 (×11): 650 mg via ORAL
  Filled 2021-12-10 (×11): qty 2

## 2021-12-10 MED ORDER — FUROSEMIDE 10 MG/ML IJ SOLN: 60.0000 mg | Freq: Two times a day (BID) | INTRAMUSCULAR | Status: DC

## 2021-12-10 MED ORDER — CHLORHEXIDINE GLUCONATE CLOTH 2 % EX PADS
6.0000 | MEDICATED_PAD | Freq: Every day | CUTANEOUS | Status: DC
  Administered 2021-12-11 – 2021-12-12 (×2): 6 via TOPICAL

## 2021-12-10 MED ORDER — HYDRALAZINE HCL 20 MG/ML IJ SOLN
10.0000 mg | INTRAMUSCULAR | Status: DC | PRN
  Administered 2021-12-10 (×2): 10 mg via INTRAVENOUS
  Filled 2021-12-10 (×2): qty 1

## 2021-12-10 MED ORDER — HEPARIN BOLUS VIA INFUSION
2000.0000 [IU] | Freq: Once | INTRAVENOUS | Status: AC
  Administered 2021-12-10: 2000 [IU] via INTRAVENOUS
  Filled 2021-12-10: qty 2000

## 2021-12-10 MED ORDER — MORPHINE SULFATE (PF) 4 MG/ML IV SOLN
4.0000 mg | Freq: Once | INTRAVENOUS | Status: AC
  Administered 2021-12-10: 4 mg via INTRAVENOUS
  Filled 2021-12-10: qty 1

## 2021-12-10 MED ORDER — HEPARIN (PORCINE) 25000 UT/250ML-% IV SOLN
1000.0000 [IU]/h | INTRAVENOUS | Status: DC
  Administered 2021-12-10: 850 [IU]/h via INTRAVENOUS
  Administered 2021-12-11: 1000 [IU]/h via INTRAVENOUS
  Filled 2021-12-10 (×2): qty 250

## 2021-12-10 MED ORDER — MELATONIN 3 MG PO TABS
3.0000 mg | ORAL_TABLET | Freq: Once | ORAL | Status: AC
  Administered 2021-12-10: 3 mg via ORAL
  Filled 2021-12-10: qty 1

## 2021-12-10 MED ORDER — ASPIRIN 81 MG PO TBEC
81.0000 mg | DELAYED_RELEASE_TABLET | Freq: Every day | ORAL | Status: DC
  Administered 2021-12-10 – 2021-12-17 (×8): 81 mg via ORAL
  Filled 2021-12-10 (×8): qty 1

## 2021-12-10 MED ORDER — MORPHINE SULFATE (PF) 2 MG/ML IV SOLN
1.0000 mg | INTRAVENOUS | Status: DC | PRN
  Administered 2021-12-10 – 2021-12-16 (×7): 1 mg via INTRAVENOUS
  Filled 2021-12-10 (×7): qty 1

## 2021-12-10 NOTE — Progress Notes (Signed)
  Progress Note   Patient: Heather Garrett [id number] DOB: 10-01-1950 DOA: 12/09/2021     0 DOS: the patient was seen and examined on 12/10/2021   Brief hospital course: 71 y.o. female with medical history significant for chronic diastolic heart failure, hyperlipidemia, GERD, depression, chronic anemia associated with baseline hemoglobin 9-11, who is admitted to [facility] on 12/09/2021 with acute hypoxic respiratory failure after presenting to [facility] ED complaining of shortness of breath.  Assessment and Plan: Acute Hypoxemic Respiratory Failure - Suspect secondary to PE vs PNA per below -VQ scan performed and reviewed. Findings of wedge-shaped peripheral perfusion defect in RUL -2d echo reviewed, RV function normal -Given trial of lasix this AM -LE dopplers ordered and reviewed, neg for DVT -Continued on heparin gtt -O2 requirements initially 12L high flow, decreased to 10L this afternoon  Pulmonary Embolus -Reviewed VQ scan with findings per above -LE dopplers neg for DVT -Continued on heparin gtt  Chronic diastolic CHF -Given trial of lasix today -O2 requirement initially 12L high flow, down to 10L this afternoon  Depression -Seems stable at this time  GERD -appears to be stable currently  PNA -Presented with WBC 17.4. Afebrile -Intially continued on vanc and cefepime. -MRSA swab neg with vanc d/c'd -Follow up on blood cultures     Subjective: Complaining of sob this AM. Reports abd pains when coughing  Physical Exam: Vitals:   12/10/21 1500 12/10/21 1600 12/10/21 1700 12/10/21 1800  BP: (!) 98/57 135/67 134/60 124/88  Pulse: 90 91 90 93  Resp: 20 (!) 24 20 (!) 21  Temp:  97.8 F (36.6 C)    TempSrc:  Oral    SpO2: 99% 97% 98% 97%  Weight:      Height:       General exam: Awake, laying in bed, in nad Respiratory system: Increased respiratory effort, no wheezing Cardiovascular system: regular rate, s1, s2 Gastrointestinal system: Soft,  nondistended, positive BS Central nervous system: CN2-12 grossly intact, strength intact Extremities: Perfused, no clubbing Skin: Normal skin turgor, no notable skin lesions seen Psychiatry: Mood normal // no visual hallucinations   Data Reviewed:  VQ reviewed: Findings consistent with intermediate probability of PE   Family Communication: Pt in room, family not at bedside  Disposition: Status is: Inpatient Remains inpatient appropriate because: Severity of illness  Planned Discharge Destination: Home    Author: Marylu Lund, MD 12/10/2021 6:09 PM  For on call review [email or web address].

## 2021-12-10 NOTE — Progress Notes (Signed)
ANTICOAGULATION CONSULT NOTE - Initial Consult  Pharmacy Consult for IV heparin Indication: pulmonary embolus  Allergies  Allergen Reactions   Contrast Media [Iodinated Contrast Media] Anaphylaxis and Hives   Ciprofloxacin Other (See Comments)    Interaction w other medicine   Erythromycin Base Nausea And Vomiting   Sulfabenzamide Other (See Comments)    UNK reaction    Patient Measurements: Height: '4\' 8"'$  (142.2 cm) Weight: 57.3 kg (126 lb 5.2 oz) IBW/kg (Calculated) : 36.3 Heparin Dosing Weight: 49 kg  Vital Signs: Temp: 98.3 F (36.8 C) (06/02 0829) Temp Source: Oral (06/02 0829) BP: 175/94 (06/02 0846) Pulse Rate: 79 (06/02 0846)  Labs: Recent Labs    12/09/21 2330 12/10/21 0300 12/10/21 0546 12/10/21 0957  HGB 9.9*  --  10.3*  --   HCT 31.4*  --  33.2*  --   PLT 393  --  405*  --   CREATININE 0.82  --  0.80  --   TROPONINIHS 34* 32*  --  32*    Estimated Creatinine Clearance: 45.5 mL/min (by C-G formula based on SCr of 0.8 mg/dL).   Medical History: Past Medical History:  Diagnosis Date   Biceps tendinitis    Bladder cancer ([place]) 2014   Breast cancer ([place]) 1990   Cobalamin deficiency    Coronary arteriosclerosis in native artery    Elevated liver function tests    Hiatal hernia    Hyperlipidemia    Migraine    Myocardial infarction ([facility])    Rotator cuff syndrome    Transitional cell carcinoma, bladder (HCC)    Vitamin D deficiency     Medications:  No prior to admission anticoagulants listed  Assessment: 71 y.o. female with medical history significant for chronic diastolic heart failure, hyperlipidemia, GERD, depression, chronic anemia associated with baseline hemoglobin 9-11, who is admitted to [facility] on 12/09/2021 with acute hypoxic respiratory failure after presenting to [facility] ED complaining of shortness of breath. Was recently hospitalized from 11/28/2021-12/03/2021 for acute cystitis.  Today, pharmacy has been consulted to dose IV  heparin for pulmonary embolism.  Today, 12/10/21 Hgb 10.3 and platelets WNL NM Pulmonary Perfusion: findings consistent with intermediate probability of pulmonary embolus   Goal of Therapy:  Heparin level 0.3-0.7 units/ml Monitor platelets by anticoagulation protocol: Yes   Plan:  Baseline aPTT and PT/INR STAT Give 3000 units heparin IV bolus x 1 Start heparin infusion at 850 units/hr Check 8 hour heparin level Monitor daily heparin level, CBC, signs/symptoms of bleeding    Royetta Asal, PharmD, BCPS Clinical Pharmacist [place] Please utilize Amion for appropriate phone number to reach the unit pharmacist ([place]) 12/10/2021 12:09 PM

## 2021-12-10 NOTE — Assessment & Plan Note (Signed)
 #)   Chronic anemia: Documented history of such, associated with baseline hemoglobin range of 9-11, with presenting hemoglobin consistent with this range.  Appears to be mildly macrocytic in nature, which also appears baseline for her.  No evidence to suggest active bleed at this time.  Plan: Repeat CBC in the morning.

## 2021-12-10 NOTE — Progress Notes (Signed)
Dr Wyline Copas notified of pt's lung scan results, new orders obtained.

## 2021-12-10 NOTE — Assessment & Plan Note (Signed)
 #)   QTc prolongation: Presenting EKG demonstrates QTc of 553 ms, which is in comparison to more mildly elevated QTc prolongation noted on most recent prior EKG, which was performed on 12/02/2021 and was associated QTc of 511 at that time. outpatient medications that may be contributing to QTc prolongation: Zoloft.    Plan: Monitor on telemetry.  Add-on serum magnesium level.  Hold outpatient Zoloft.

## 2021-12-10 NOTE — Assessment & Plan Note (Signed)
 #)   Chronic diastolic heart failure: documented history of such, with most recent echocardiogram performed in March 2022, which is notable for LVEF 50 to 55% as well as grade 1 diastolic dysfunction.  While the differential for the patient's presenting acute hypoxic respiratory failure includes potential contribution from acute on chronic diastolic heart failure, clinically, presentation appears more suggestive pneumonia, as outlined above.  It is possible that there may be a secondary contribution from acutely decompensated heart failure as a consequence of physiologic stress stemming from the associated severe sepsis thereof.  I have ordered a single dose of IV Lasix, will closely monitor ensuing urine output and response in supplemental oxygen requirements, and pursue echocardiogram in the morning.  Plan: monitor strict I's & O's and daily weights. Repeat BMP in AM. Check serum mag level.  Lasix 20 mg IV x1 now, as above.  Echocardiogram ordered for the morning.

## 2021-12-10 NOTE — Assessment & Plan Note (Signed)
  #)   GERD: documented h/o such; on Protonix as outpatient.   Plan: continue home PPI.    

## 2021-12-10 NOTE — Assessment & Plan Note (Signed)
 #)   Acute hypoxic respiratory failure: in the context of acute respiratory symptoms and no known baseline supplemental O2 requirements, presenting O2 sat noted to be in the high 80s on room air, subsequent improving into the mid 90s on 12 L high flow nasal cannula. Appears to be on basis of multilobular pneumonia in the setting of 3 to 4 days of progressive shortness of breath, new onset cough, subjective fever, with chest x-ray showing evidence of bilateral airspace interstitial opacities concerning for infection, although differential also included edema.  Clinically, presentation appears suggestive of acutely decompensated heart failure, in the absence of any associated orthopnea, PND, no recent worsening of peripheral edema.  BNP is noted to be 900, although no prior BNP data point available for point comparison.  No known chronic underlying pulmonary conditions.   In terms of other considered etiologies, ACS appears less likely at this time in the absence of any recent CP presenting EKG showing no e/o acute ischemic process, while mildly elevated initial troponin is felt to be on the basis of supply/demand mismatch in the context of severe sepsis due to pneumonia as well as from diminished oxygen delivery capacity as a consequence of acute hypoxic respiratory failure, as opposed to representing a type I process due to acute plaque rupture. COVID-19 PCR negative.   Plan: further evaluation/management of presenting severe sepsis due to multilobular pneumonia, as further detailed below, including broad-spectrum IV antibiotics. Monitor continuous pulse ox with prn supplemental O2 to maintain O2 sats greater than or equal to 92%. monitor on telemetry. CMP/CBC in the AM. Check serum Mg and Phos levels. Check blood gas. Flutter valve, incentive spirometry.  Have ordered a single dose of Lasix 20 mg IV x1 to monitor for ensuing supplemental oxygen requirement response to this intervention.  Add on procalcitonin  level.  Trend troponin.  Echo in the a.m.

## 2021-12-10 NOTE — Assessment & Plan Note (Signed)
(  please see severe sepsis) 

## 2021-12-10 NOTE — Progress Notes (Signed)
ANTICOAGULATION CONSULT NOTE - Initial Consult  Pharmacy Consult for IV heparin Indication: pulmonary embolus  Allergies  Allergen Reactions   Contrast Media [Iodinated Contrast Media] Anaphylaxis and Hives   Ciprofloxacin Other (See Comments)    Interaction w other medicine   Erythromycin Base Nausea And Vomiting   Sulfabenzamide Other (See Comments)    UNK reaction    Patient Measurements: Height: '4\' 8"'$  (142.2 cm) Weight: 57.3 kg (126 lb 5.2 oz) IBW/kg (Calculated) : 36.3 Heparin Dosing Weight: 49 kg  Vital Signs: Temp: 97.9 F (36.6 C) (06/02 2046) Temp Source: Oral (06/02 2046) BP: 118/68 (06/02 2000) Pulse Rate: 83 (06/02 2000)  Labs: Recent Labs    12/09/21 2330 12/10/21 0300 12/10/21 0546 12/10/21 0957 12/10/21 1232 12/10/21 2039  HGB 9.9*  --  10.3*  --   --   --   HCT 31.4*  --  33.2*  --   --   --   PLT 393  --  405*  --   --   --   APTT  --   --   --   --  36  --   LABPROT  --   --   --   --  14.6  --   INR  --   --   --   --  1.2  --   HEPARINUNFRC  --   --   --   --   --  <0.10*  CREATININE 0.82  --  0.80  --   --   --   TROPONINIHS 34* 32*  --  32*  --   --      Estimated Creatinine Clearance: 45.5 mL/min (by C-G formula based on SCr of 0.8 mg/dL).   Medical History: Past Medical History:  Diagnosis Date   Biceps tendinitis    Bladder cancer ([place]) 2014   Breast cancer ([place]) 1990   Cobalamin deficiency    Coronary arteriosclerosis in native artery    Elevated liver function tests    Hiatal hernia    Hyperlipidemia    Migraine    Myocardial infarction (H[facility])    Rotator cuff syndrome    Transitional cell carcinoma, bladder (HCC)    Vitamin D deficiency     Medications:  No prior to admission anticoagulants listed  Assessment: 71 y.o. female with medical history significant for chronic diastolic heart failure, hyperlipidemia, GERD, depression, chronic anemia associated with baseline hemoglobin 9-11, who is admitted to [facility] on 12/09/2021 with acute hypoxic respiratory failure after presenting to [facility] ED complaining of shortness of breath. Was recently hospitalized from 11/28/2021-12/03/2021 for acute cystitis.  Today, pharmacy has been consulted to dose IV heparin for pulmonary embolism.  Today, 12/10/21 Hgb 10.3 and platelets WNL NM Pulmonary Perfusion: findings consistent with intermediate probability of pulmonary embolus  PM UPDATE: - Heparin level undetectable on heparin 850 units/hr  - No issues with heparin being held or bleeding per discussion with RN  Goal of Therapy:  Heparin level 0.3-0.7 units/ml Monitor platelets by anticoagulation protocol: Yes   Plan:  Give 2000 units heparin IV bolus x 1 Increase heparin infusion to 1000 units/hr Check 8 hour heparin level Monitor daily heparin level, CBC, signs/symptoms of bleeding  Dimple Nanas, PharmD 12/10/2021 9:01 PM

## 2021-12-10 NOTE — Assessment & Plan Note (Signed)
 #)   Depression: Documented history of such, on Zoloft as an outpatient.  In the setting of QTc prolongation, will hold home Zoloft for now.  Plan: Hold home Zoloft for now.

## 2021-12-10 NOTE — Progress Notes (Signed)
Bilateral lower extremity venous duplex has been completed. Preliminary results can be found in CV Proc through chart review.   12/10/21 4:10 PM Carlos Levering RVT

## 2021-12-10 NOTE — Assessment & Plan Note (Signed)
  #)   Severe sepsis due to multifocal pneumonia: Diagnosis on the basis of 3 to 4 days of progressive shortness of breath associated with new onset cough, subjective fever, with chest x-ray showing evidence of bilateral airspace and interstitial opacities concerning for infection.  SIRS criteria met via presenting new onset leukocytosis as well as tachypnea. Lactic acid level: Nonelevated at 1.2. Of note, given the associated presence of suspected end organ damage in the form of concominant presenting acute Evoxac respiratory failure, criteria are met for pt's sepsis to be considered severe in nature. However, in the absence of lactic acid level that is greater than or equal to 4.0, and in the absence of any associated hypotension refractory to IVF's, there are no indications for administration of a 30 mL/kg IVF bolus at this time.   in setting of previous hospitalization within the last 90 days during which patient received IV abx, with consequential associated increased risk for MRSA/pseudomonas infxn, will continue expand spectrum of antibiotics to provide coverage against these organisms.  Specifically, we will continue IV vancomycin and cefepime initiated in the ED this evening. Will Check MRSA PCR, which, if negative, can consider discontinuation of IV vancomycin given the high negative predictive value for MRSA pneumonia associated with such a result.  Of note, IV antibiotics were initiated today prior to collection of blood cultures.  No overt evidence of additional underlying infectious process at this time.  Will check urinalysis for completeness, while noting interval improvement in the patient's acute urinary symptoms that she was experiencing at the time of her most recent previous hospitalization and status post interval IV antibiotics, as above.  COVID-negative.   Plan: CBC w/ diff and CMP in AM.  Abx: IV vancomycin and cefepime, as above add on procalcitonin level.  Monitor continuous pulse  oximetry.  Monitor on telemetry.  As needed acetaminophen for fever.  Flutter valve.  Incentive spirometry.  Check strep pneumoniae urine antigen, Legionella urine antigen, mycoplasma antibodies.  Check urinalysis.

## 2021-12-10 NOTE — H&P (Signed)
History and Physical    PLEASE NOTE THAT [employer]AGON DI[employer]ATION SOFTWARE WAS USED IN THE CONSTRUCTION OF THIS NOTE.   Heather Garrett YBW:[id number] DOB: 1951-05-27 DOA: 12/09/2021  PCP: Aura Dials, PA-C  Patient coming from: home   I have personally briefly reviewed patient's old medical records in [place]  Chief Complaint: Shortness of breath  HPI: Heather Garrett is a 71 y.o. female with medical history significant for chronic diastolic heart failure, hyperlipidemia, GERD, depression, chronic anemia associated with baseline hemoglobin 9-11, who is admitted to [facility] on 12/09/2021 with acute hypoxic respiratory failure after presenting to [facility] ED complaining of shortness of breath.   Following history is obtained via my discussions with the patient, my discussions with the EDP, and via chart review.  The patient was recently hospitalized from 11/28/2021 -12/03/2021 for acute cystitis during which she received IV antibiotics leading up to discharge, which time she wishes to send out on 2 additional days of Keflex for her urinary tract infection.  Over the last 4 days, she notes progressive shortness of breath associate with new onset nonproductive cough, subjective fever in the absence of chills, full body rigors, or generalized myalgias.  She denies any associated orthopnea, PND, and denies any associated worsening of peripheral edema.  Not associate with any chest pain, diaphoresis, palpitations, nausea, vomiting, presyncope, or syncope.  Denies any associated wheezing, mopped assist, calf tenderness, or new lower extremity erythema.  Denies any recent neck stiffness, preceding rhinitis, rhinorrhea, sore throat, abdominal pain, diarrhea, rash.  She also denies any residual dysuria following treatment of her urinary tract infection during most recent prior hospitalization.  She has a history of chronic diastolic heart failure, with most recent echocardiogram in March  2022 notable for LVEF 50 to 76%, grade 1 diastolic dysfunction, moderate enlargement of the left atrium, and trace mitral regurgitation.  Aside from HCTZ, she is not on any scheduled diuretic medications as an outpatient.  She notes that she is a former smoker, having completely quit smoking approximately 30 years ago, denies any known history of chronic underlying pulmonary pathology.  She also confirms no baseline supplemental oxygen requirements.  Of note, the patient has a documented history of anaphylactic response to contrast.     ED Course:  Vital signs in the ED were notable for the following: Afebrile; heart rate 77-81; blood pressure 133/86; respiratory rate 17-26, initial oxygen saturations in the high 80s on room air, subsequent proving into the range of 94 to 95% on 12 L high flow nasal cannula.   Labs were notable for the following: CMP notable for the following: Sodium 139, bicarbonate 22, creatinine 0.82 compared to most recent prior value of 0.69 on 12/03/2021, calcium, corrected for mild hypoalbuminemia noted to be 9.7, albumin 2.3, alkaline phosphatase 160, otherwise, liver enzymes found to be within normal limits.  BNP 900, without prior BNP data point available for point comparison.  High-sensitivity troponin I 34, without prior high-sensitivity troponin I data point available for point comparison.  CBC notable for white blood cell count of 17,400 with 79% neutrophils compared to most recent prior will blood cell count of 7800 on 11/13/2021, hemoglobin 9.9 compared to most recent prior value of 9.0 on 12/03/2021.  Lactate 1.2.  COVID-19 PCR negative.  Imaging and additional notable ED work-up: EKG, comparison to most recent prior EKG from 12/02/2021 shows sinus rhythm with heart rate 79, prolonged QTc of 553, nonspecific T wave inversion in leads II, V4, V5,  all of which appear unchanged from most recent prior EKG, also showing nonspecific T wave flattening in aVF and no evidence of ST  changes, including no evidence of ST elevation.  Chest x-ray shows bilateral airspace and interstitial opacities concerning for infection versus edema, also demonstrating small bilateral pleural effusions in the absence of any evidence of pneumothorax.  While in the ED, the following were administered: Fentanyl 50 mcg IV x1, morphine 4 mg IV x1, cefepime, vancomycin.  Subsequently, the patient was admitted for further evaluation management of presenting acute hypoxic respiratory failure, which was felt to be on the basis of severe sepsis due to pneumonia, with presentation also notable for mildly elevated high-sensitivity troponin I as well as QTc prolongation.   Review of Systems: As per HPI otherwise 10 point review of systems negative.   Past Medical History:  Diagnosis Date   Biceps tendinitis    Bladder cancer ([place]) 2014   Breast cancer ([place]) 1990   Cobalamin deficiency    Coronary arteriosclerosis in native artery    Elevated liver function tests    Hiatal hernia    Hyperlipidemia    Migraine    Myocardial infarction (HCC)    Rotator cuff syndrome    Transitional cell carcinoma, bladder (HCC)    Vitamin D deficiency     Past Surgical History:  Procedure Laterality Date   BREAST SURGERY     CARDIAC CATHETERIZATION     CATARACT EXTRACTION     CHOLECYSTECTOMY  1979   DG  BONE DENSITY ([place] [facility])     ENDOMETRIAL ABLATION     GASTRIC BYPASS     MASTECTOMY     OTHER SURGICAL HISTORY     CANCER SURGERY   TOTAL HIP ARTHROPLASTY Left 09/06/2021   Procedure: TOTAL HIP ARTHROPLASTY;  Surgeon: Willaim Sheng, MD;  Location: [facility];  Service: Orthopedics;  Laterality: Left;    Social History:  reports that she has quit smoking. She has never been exposed to tobacco smoke. She has never used smokeless tobacco. She reports current alcohol use of about 14.0 standard drinks per week. She reports that she does not use drugs.   Allergies  Allergen Reactions   Contrast Media  [Iodinated Contrast Media] Anaphylaxis and Hives   Ciprofloxacin Other (See Comments)    Interaction w other medicine   Erythromycin Base Nausea And Vomiting   Sulfabenzamide Other (See Comments)    UNK reaction    Family History  Problem Relation Age of Onset   Congestive Heart Failure Mother    Coronary artery disease Father    Heart attack Father 74       cause of death.   Diabetes Mellitus II Sister    Hypertension Sister    Breast cancer Sister    Migraines Neg Hx    Colon cancer Neg Hx    Esophageal cancer Neg Hx    Stomach cancer Neg Hx     Family history reviewed and not pertinent    Prior to Admission medications   Medication Sig Start Date End Date Taking? Authorizing Provider  aspirin 81 MG EC tablet Take 1 tablet (81 mg total) by mouth every other day. Swallow whole.  Hold aspirin while taking eliquis, Patient taking differently: Take 81 mg by mouth daily. Swallow whole. 09/10/21  Yes Florencia Reasons, MD  carvedilol (COREG) 3.125 MG tablet Take 1 tablet (3.125 mg total) by mouth 2 (two) times daily. Hold if sbp less than 100 or heart rate less  than 50 12/03/21  Yes Florencia Reasons, MD  Cholecalciferol 25 MCG (1000 UT) tablet Take 1,000 Units by mouth daily.   Yes [provider]  ezetimibe-simvastatin (VYTORIN) 10-80 MG tablet Take 1 tablet by mouth at bedtime.   Yes [provider]  ferrous sulfate 325 (65 FE) MG EC tablet Take 1 tablet (325 mg total) by mouth every Monday, Wednesday, and Friday. 09/10/21 12/09/21 Yes Florencia Reasons, MD  folic acid (FOLVITE) 1 MG tablet Take 1 tablet (1 mg total) by mouth daily. 12/03/21 12/03/22 Yes Florencia Reasons, MD  hydrochlorothiazide (HYDRODIURIL) 25 MG tablet Take 25 mg by mouth daily as needed (swelling/fluid).   Yes [provider]  NURTEC 75 MG TBDP Take 75 mg by mouth daily as needed for headache. 05/19/21  Yes [provider]  oxyCODONE (OXY IR/ROXICODONE) 5 MG immediate release tablet Take 1 tablet (5 mg total) by mouth  every 4 (four) hours as needed for severe pain. 12/03/21  Yes Florencia Reasons, MD  pantoprazole (PROTONIX) 40 MG tablet Take 1 tablet (40 mg total) by mouth 2 (two) times daily before a meal. 11/01/21  Yes Vicie Mutters R, PA-C  polyethylene glycol (MIRALAX / GLYCOLAX) 17 g packet Take 17 g by mouth daily. Patient taking differently: Take 17 g by mouth daily as needed for mild constipation. 09/11/21  Yes Florencia Reasons, MD  Polyethylene Glycol 400 (BLINK TEARS) 0.25 % SOLN Place 1 drop into both eyes daily as needed (dry eyes).   Yes [provider]  senna-docusate (SENOKOT-S) 8.6-50 MG tablet Take 1 tablet by mouth at bedtime. 09/10/21  Yes Florencia Reasons, MD  sertraline (ZOLOFT) 100 MG tablet Take 100 mg by mouth daily.   Yes [provider]  thiamine 100 MG tablet Take 1 tablet (100 mg total) by mouth daily. 12/03/21  Yes Florencia Reasons, MD  tiZANidine (ZANAFLEX) 4 MG tablet Take 4 mg by mouth at bedtime.   Yes [provider]  valsartan (DIOVAN) 320 MG tablet Take 320 mg by mouth daily. 11/28/21  Yes [provider]  vitamin B-12 (CYANOCOBALAMIN) 1000 MCG tablet Take 1,000 mcg by mouth daily.   Yes [provider]  lidocaine (XYLOCAINE) 2 % solution Use as directed 15 mLs in the mouth or throat every 6 (six) hours as needed for mouth pain. Patient not taking: Reported on 12/09/2021 12/03/21   Florencia Reasons, MD  lidocaine (XYLOCAINE) 5 % ointment Apply 1 application topically as needed. Patient not taking: Reported on 12/09/2021 06/30/21   Margette Fast, MD     Objective    Physical Exam: Vitals:   12/10/21 0100 12/10/21 0133 12/10/21 0200 12/10/21 0236  BP: (!) 153/89 (!) 133/91 (!) 141/86 139/88  Pulse: 80 77 78 76  Resp: 17 (!) 21 (!) 21 19  Temp:      TempSrc:      SpO2: 95% 94% 93% 93%    General: appears to be stated age; alert, oriented; mildly increased work of breathing noted Skin: warm, dry, no rash Head:  AT/ Mouth:  Oral mucosa membranes appear moist, normal  dentition Neck: supple; trachea midline Heart:  RRR; did not appreciate any M/R/G Lungs: CTAB, did not appreciate any wheezes, rales, or rhonchi Abdomen: + BS; soft, ND, NT Vascular: 2+ pedal pulses b/l; 2+ radial pulses b/l Extremities: Trace edema bilateral lower extremities,  no muscle wasting Neuro: strength and sensation intact in upper and lower extremities b/l     Labs on Admission: I have personally  reviewed following labs and imaging studies  CBC: Recent Labs  Lab 12/03/21 0637 12/09/21 2330  WBC 7.8 17.4*  NEUTROABS 4.8 13.9*  HGB 9.0* 9.9*  HCT 29.7* 31.4*  MCV 105.3* 101.6*  PLT 171 173   Basic Metabolic Panel: Recent Labs  Lab 12/03/21 0637 12/09/21 2330  NA 138 139  K 3.6 3.7  CL 109 110  CO2 23 22  GLUCOSE 99 117*  BUN 15 21  CREATININE 0.69 0.82  CALCIUM 8.3* 8.4*  MG 2.0  --    GFR: Estimated Creatinine Clearance: 44.6 mL/min (by C-G formula based on SCr of 0.82 mg/dL). Liver Function Tests: Recent Labs  Lab 12/09/21 2330  AST 23  ALT 14  ALKPHOS 160*  BILITOT 0.5  PROT 5.7*  ALBUMIN 2.3*   Recent Labs  Lab 12/09/21 2330  LIPASE 22   No results for input(s): AMMONIA in the last 168 hours. Coagulation Profile: No results for input(s): INR, PROTIME in the last 168 hours. Cardiac Enzymes: No results for input(s): CKTOTAL, CKMB, CKMBINDEX, TROPONINI in the last 168 hours. BNP (last 3 results) No results for input(s): PROBNP in the last 8760 hours. HbA1C: No results for input(s): HGBA1C in the last 72 hours. CBG: No results for input(s): GLUCAP in the last 168 hours. Lipid Profile: No results for input(s): CHOL, HDL, LDLCALC, TRIG, CHOLHDL, LDLDIRECT in the last 72 hours. Thyroid Function Tests: No results for input(s): TSH, T4TOTAL, FREET4, T3FREE, THYROIDAB in the last 72 hours. Anemia Panel: No results for input(s): VITAMINB12, FOLATE, FERRITIN, TIBC, IRON, RETICCTPCT in the last 72 hours. Urine analysis:    Component Value  Date/Time   COLORURINE YELLOW 11/28/2021 1723   APPEARANCEUR CLOUDY (A) 11/28/2021 1723   LABSPEC 1.028 11/28/2021 1723   PHURINE 5.5 11/28/2021 1723   GLUCOSEU NEGATIVE 11/28/2021 1723   HGBUR SMALL (A) 11/28/2021 1723   BILIRUBINUR NEGATIVE 11/28/2021 1723   KETONESUR TRACE (A) 11/28/2021 1723   PROTEINUR 100 (A) 11/28/2021 1723   NITRITE POSITIVE (A) 11/28/2021 1723   LEUKOCYTESUR LARGE (A) 11/28/2021 1723    Radiological Exams on Admission: DG Chest Portable 1 View  Result Date: 12/09/2021 CLINICAL DATA:  Shortness of breath EXAM: PORTABLE CHEST 1 VIEW COMPARISON:  12/01/2021 FINDINGS: Diffuse bilateral interstitial and airspace disease with cardiomegaly. Findings likely reflect edema/CHF. Suspect small bilateral effusions. No acute bony abnormality. IMPRESSION: Cardiomegaly with diffuse interstitial and alveolar opacities, likely CHF. Small effusions. Electronically Signed   By: Rolm Baptise M.D.   On: 12/09/2021 22:09     EKG: Independently reviewed, with result as described above.    Assessment/Plan    Principal Problem:   Acute respiratory failure with hypoxia (HCC) Active Problems:   Prolonged QT interval   Severe sepsis (HCC)   Multifocal pneumonia   GERD (gastroesophageal reflux disease)   Depression   Chronic diastolic CHF (congestive heart failure) (HCC)   Chronic anemia      #) Acute hypoxic respiratory failure: in the context of acute respiratory symptoms and no known baseline supplemental O2 requirements, presenting O2 sat noted to be in the high 80s on room air, subsequent improving into the mid 90s on 12 L high flow nasal cannula. Appears to be on basis of multilobular pneumonia in the setting of 3 to 4 days of progressive shortness of breath, new onset cough, subjective fever, with chest x-ray showing evidence of bilateral airspace interstitial opacities concerning for infection, although differential also included edema.  Clinically, presentation appears  suggestive of acutely  decompensated heart failure, in the absence of any associated orthopnea, PND, no recent worsening of peripheral edema.  BNP is noted to be 900, although no prior BNP data point available for point comparison.  No known chronic underlying pulmonary conditions.   In terms of other considered etiologies, ACS appears less likely at this time in the absence of any recent CP presenting EKG showing no e/o acute ischemic process, while mildly elevated initial troponin is felt to be on the basis of supply/demand mismatch in the context of severe sepsis due to pneumonia as well as from diminished oxygen delivery capacity as a consequence of acute hypoxic respiratory failure, as opposed to representing a type I process due to acute plaque rupture. COVID-19 PCR negative.   Plan: further evaluation/management of presenting severe sepsis due to multilobular pneumonia, as further detailed below, including broad-spectrum IV antibiotics. Monitor continuous pulse ox with prn supplemental O2 to maintain O2 sats greater than or equal to 92%. monitor on telemetry. CMP/CBC in the AM. Check serum Mg and Phos levels. Check blood gas. Flutter valve, incentive spirometry.  Have ordered a single dose of Lasix 20 mg IV x1 to monitor for ensuing supplemental oxygen requirement response to this intervention.  Add on procalcitonin level.  Trend troponin.  Echo in the a.m.           #) Severe sepsis due to multifocal pneumonia: Diagnosis on the basis of 3 to 4 days of progressive shortness of breath associated with new onset cough, subjective fever, with chest x-ray showing evidence of bilateral airspace and interstitial opacities concerning for infection.  SIRS criteria met via presenting new onset leukocytosis as well as tachypnea. Lactic acid level: Nonelevated at 1.2. Of note, given the associated presence of suspected end organ damage in the form of concominant presenting acute Evoxac respiratory  failure, criteria are met for pt's sepsis to be considered severe in nature. However, in the absence of lactic acid level that is greater than or equal to 4.0, and in the absence of any associated hypotension refractory to IVF's, there are no indications for administration of a 30 mL/kg IVF bolus at this time.   in setting of previous hospitalization within the last 90 days during which patient received IV abx, with consequential associated increased risk for MRSA/pseudomonas infxn, will continue expand spectrum of antibiotics to provide coverage against these organisms.  Specifically, we will continue IV vancomycin and cefepime initiated in the ED this evening. Will Check MRSA PCR, which, if negative, can consider discontinuation of IV vancomycin given the high negative predictive value for MRSA pneumonia associated with such a result.  Of note, IV antibiotics were initiated today prior to collection of blood cultures.  No overt evidence of additional underlying infectious process at this time.  Will check urinalysis for completeness, while noting interval improvement in the patient's acute urinary symptoms that she was experiencing at the time of her most recent previous hospitalization and status post interval IV antibiotics, as above.  COVID-negative.   Plan: CBC w/ diff and CMP in AM.  Abx: IV vancomycin and cefepime, as above add on procalcitonin level.  Monitor continuous pulse oximetry.  Monitor on telemetry.  As needed acetaminophen for fever.  Flutter valve.  Incentive spirometry.  Check strep pneumoniae urine antigen, Legionella urine antigen, mycoplasma antibodies.  Check urinalysis.           #) QTc prolongation: Presenting EKG demonstrates QTc of 553 ms, which is in comparison to more mildly elevated QTc  prolongation noted on most recent prior EKG, which was performed on 12/02/2021 and was associated QTc of 511 at that time. outpatient medications that may be contributing to QTc  prolongation: Zoloft.    Plan: Monitor on telemetry.  Add-on serum magnesium level.  Hold outpatient Zoloft.           #) GERD: documented h/o such; on Protonix as outpatient.   Plan: continue home PPI.          #) Depression: Documented history of such, on Zoloft as an outpatient.  In the setting of QTc prolongation, will hold home Zoloft for now.  Plan: Hold home Zoloft for now.             #) Chronic diastolic heart failure: documented history of such, with most recent echocardiogram performed in March 2022, which is notable for LVEF 50 to 55% as well as grade 1 diastolic dysfunction.  While the differential for the patient's presenting acute hypoxic respiratory failure includes potential contribution from acute on chronic diastolic heart failure, clinically, presentation appears more suggestive pneumonia, as outlined above.  It is possible that there may be a secondary contribution from acutely decompensated heart failure as a consequence of physiologic stress stemming from the associated severe sepsis thereof.  I have ordered a single dose of IV Lasix, will closely monitor ensuing urine output and response in supplemental oxygen requirements, and pursue echocardiogram in the morning.  Plan: monitor strict I's & O's and daily weights. Repeat BMP in AM. Check serum mag level.  Lasix 20 mg IV x1 now, as above.  Echocardiogram ordered for the morning.           #) Chronic anemia: Documented history of such, associated with baseline hemoglobin range of 9-11, with presenting hemoglobin consistent with this range.  Appears to be mildly macrocytic in nature, which also appears baseline for her.  No evidence to suggest active bleed at this time.  Plan: Repeat CBC in the morning.       DVT prophylaxis: SCD's   Code Status: Full code (as confirmed per my discussions with the patient this evening) Family Communication: none Disposition Plan: Per Rounding  Team Consults called: none;  Admission status: Inpatient   PLEASE NOTE THAT DRAGON DICTATION SOFTWARE WAS USED IN THE CONSTRUCTION OF THIS NOTE.   [place] DO [facility] [place]   12/10/2021, 4:09 AM

## 2021-12-10 NOTE — Hospital Course (Signed)
71 y.o. female with medical history significant for chronic diastolic heart failure, hyperlipidemia, GERD, depression, chronic anemia associated with baseline hemoglobin 9-11, who is admitted to [facility] on 12/09/2021 with acute hypoxic respiratory failure after presenting to [facility] ED complaining of shortness of breath.

## 2021-12-11 DIAGNOSIS — J9601 Acute respiratory failure with hypoxia: Secondary | ICD-10-CM | POA: Diagnosis not present

## 2021-12-11 DIAGNOSIS — J189 Pneumonia, unspecified organism: Secondary | ICD-10-CM | POA: Diagnosis not present

## 2021-12-11 DIAGNOSIS — D649 Anemia, unspecified: Secondary | ICD-10-CM | POA: Diagnosis not present

## 2021-12-11 LAB — COMPREHENSIVE METABOLIC PANEL
ALT: 14 U/L (ref 0–44)
AST: 22 U/L (ref 15–41)
Albumin: 2.3 g/dL — ABNORMAL LOW (ref 3.5–5.0)
Alkaline Phosphatase: 170 U/L — ABNORMAL HIGH (ref 38–126)
Anion gap: 10 (ref 5–15)
BUN: 20 mg/dL (ref 8–23)
CO2: 27 mmol/L (ref 22–32)
Calcium: 8.1 mg/dL — ABNORMAL LOW (ref 8.9–10.3)
Chloride: 101 mmol/L (ref 98–111)
Creatinine, Ser: 0.9 mg/dL (ref 0.44–1.00)
GFR, Estimated: >60 mL/min (ref >60)
Glucose, Bld: 105 mg/dL — ABNORMAL HIGH (ref 70–99)
Potassium: 2.9 mmol/L — ABNORMAL LOW (ref 3.5–5.1)
Sodium: 138 mmol/L (ref 135–145)
Total Bilirubin: 0.7 mg/dL (ref 0.3–1.2)
Total Protein: 6 g/dL — ABNORMAL LOW (ref 6.5–8.1)

## 2021-12-11 LAB — CBC
HCT: 34.6 % — ABNORMAL LOW (ref 36.0–46.0)
Hemoglobin: 10.9 g/dL — ABNORMAL LOW (ref 12.0–15.0)
MCH: 31.5 pg (ref 26.0–34.0)
MCHC: 31.5 g/dL (ref 30.0–36.0)
MCV: 100 fL (ref 80.0–100.0)
Platelets: 391 10*3/uL (ref 150–400)
RBC: 3.46 MIL/uL — ABNORMAL LOW (ref 3.87–5.11)
RDW: 14.7 % (ref 11.5–15.5)
WBC: 16.2 10*3/uL — ABNORMAL HIGH (ref 4.0–10.5)
nRBC: 0 % (ref 0.0–0.2)

## 2021-12-11 LAB — HEPARIN LEVEL (UNFRACTIONATED): Heparin Unfractionated: <0.1 IU/mL — ABNORMAL LOW (ref 0.30–0.70)

## 2021-12-11 MED ORDER — APIXABAN 5 MG PO TABS
10.0000 mg | ORAL_TABLET | Freq: Two times a day (BID) | ORAL | Status: DC
  Administered 2021-12-11 – 2021-12-17 (×12): 10 mg via ORAL
  Filled 2021-12-11 (×12): qty 2

## 2021-12-11 MED ORDER — FUROSEMIDE 10 MG/ML IJ SOLN
60.0000 mg | Freq: Once | INTRAMUSCULAR | Status: AC
  Administered 2021-12-11: 60 mg via INTRAVENOUS
  Filled 2021-12-11: qty 6

## 2021-12-11 MED ORDER — MELATONIN 3 MG PO TABS
3.0000 mg | ORAL_TABLET | Freq: Once | ORAL | Status: AC
  Administered 2021-12-11: 3 mg via ORAL
  Filled 2021-12-11: qty 1

## 2021-12-11 MED ORDER — POTASSIUM CHLORIDE CRYS ER 20 MEQ PO TBCR
60.0000 meq | EXTENDED_RELEASE_TABLET | ORAL | Status: AC
  Administered 2021-12-11 (×2): 60 meq via ORAL
  Filled 2021-12-11 (×2): qty 3

## 2021-12-11 MED ORDER — APIXABAN 5 MG PO TABS: 5.0000 mg | ORAL_TABLET | Freq: Two times a day (BID) | ORAL | Status: DC

## 2021-12-11 MED ORDER — HEPARIN (PORCINE) 25000 UT/250ML-% IV SOLN: 1150.0000 [IU]/h | INTRAVENOUS | Status: DC

## 2021-12-11 MED ORDER — HEPARIN BOLUS VIA INFUSION
2000.0000 [IU] | Freq: Once | INTRAVENOUS | Status: AC
Start: 2021-12-11 — End: 2021-12-11
  Administered 2021-12-11: 2000 [IU] via INTRAVENOUS
  Filled 2021-12-11: qty 2000

## 2021-12-11 NOTE — Progress Notes (Signed)
ANTICOAGULATION CONSULT NOTE - Initial Consult  Pharmacy Consult for IV heparin Indication: pulmonary embolus  Allergies  Allergen Reactions   Contrast Media [Iodinated Contrast Media] Anaphylaxis and Hives   Ciprofloxacin Other (See Comments)    Interaction w other medicine   Erythromycin Base Nausea And Vomiting   Sulfabenzamide Other (See Comments)    UNK reaction    Patient Measurements: Height: '4\' 8"'$  (142.2 cm) Weight: 57.8 kg (127 lb 6.8 oz) IBW/kg (Calculated) : 36.3 Heparin Dosing Weight: 49 kg  Vital Signs: Temp: 98.3 F (36.8 C) (06/03 0823) Temp Source: Oral (06/03 0823) BP: 151/82 (06/03 0900) Pulse Rate: 88 (06/03 0900)  Labs: Recent Labs    12/09/21 2330 12/10/21 0300 12/10/21 0546 12/10/21 0957 12/10/21 1232 12/10/21 2039 12/11/21 0928  HGB 9.9*  --  10.3*  --   --   --  10.9*  HCT 31.4*  --  33.2*  --   --   --  34.6*  PLT 393  --  405*  --   --   --  391  APTT  --   --   --   --  36  --   --   LABPROT  --   --   --   --  14.6  --   --   INR  --   --   --   --  1.2  --   --   HEPARINUNFRC  --   --   --   --   --  <0.10* <0.10*  CREATININE 0.82  --  0.80  --   --   --  0.90  TROPONINIHS 34* 32*  --  32*  --   --   --      Estimated Creatinine Clearance: 40.6 mL/min (by C-G formula based on SCr of 0.9 mg/dL).   Medical History: Past Medical History:  Diagnosis Date   Biceps tendinitis    Bladder cancer ([place]) 2014   Breast cancer ([place]) 1990   Cobalamin deficiency    Coronary arteriosclerosis in native artery    Elevated liver function tests    Hiatal hernia    Hyperlipidemia    Migraine    Myocardial infarction (HCC)    Rotator cuff syndrome    Transitional cell carcinoma, bladder (HCC)    Vitamin D deficiency     Medications:  No prior to admission anticoagulants listed  Assessment: 71 y.o. female with medical history significant for chronic diastolic heart failure, hyperlipidemia, GERD, depression, chronic anemia associated  with baseline hemoglobin 9-11, who is admitted to [facility] on 12/09/2021 with acute hypoxic respiratory failure after presenting to [facility] ED complaining of shortness of breath. Was recently hospitalized from 11/28/2021-12/03/2021 for acute cystitis.  On 6/2, findings from [facility] scan were consistent with intermediate probability of pulmonary embolus and pharmacy was consulted to dose IV heparin for pulmonary embolism.  Today, 12/11/21 Hgb steady at 10.9 and platelets WNL Heparin level undetectable on heparin 1000 units/hr  No issues with heparin being held or bleeding per discussion with RN  Goal of Therapy:  Heparin level 0.3-0.7 units/ml Monitor platelets by anticoagulation protocol: Yes   Plan:  Give 2000 units heparin IV bolus x 1 Increase heparin infusion to 1150 units/hr Check 8 hour heparin level Monitor daily heparin level, CBC, signs/symptoms of bleeding   Royetta Asal, PharmD, BCPS Clinical Pharmacist [place] Please utilize Amion for appropriate phone number to reach the unit pharmacist ([place])  12/11/2021 10:36 AM]

## 2021-12-11 NOTE — Progress Notes (Signed)
  Progress Note   Patient: Heather Garrett JKK:[id number] DOB: [birth year] DOA: 12/09/2021     1 DOS: the patient was seen and examined on 12/11/2021   Brief hospital course: 71 y.o. female with medical history significant for chronic diastolic heart failure, hyperlipidemia, GERD, depression, chronic anemia associated with baseline hemoglobin 9-11, who is admitted to [facility] on 12/09/2021 with acute hypoxic respiratory failure after presenting to [facility] ED complaining of shortness of breath.  Assessment and Plan: Acute Hypoxemic Respiratory Failure - Suspect secondary to PE vs PNA per below -VQ scan performed and reviewed. Findings of wedge-shaped peripheral perfusion defect in RUL -2d echo reviewed, RV function normal -Given trial of lasix with improvement. Will give another trial of lasix -LE dopplers ordered and reviewed, neg for DVT -Initially on heparin gtt, now on eliquis -O2 requirements initially 12L high flow, decreased to 8L this AM. Pt reports feeling much improved already  Pulmonary Embolus -Reviewed VQ scan with findings per above -LE dopplers neg for DVT -Now on eliquis  Chronic diastolic CHF -Given trial of lasix today -O2 requirement initially 12L high flow, down to 8L this AM  Depression -Seems stable at this time  GERD -appears to be stable currently  PNA -Presented with WBC 16.2. Afebrile -Intially continued on vanc and cefepime. -MRSA swab neg with vanc d/c'd -Recheck cbc in AM  Hypokalemia -Will replace -repeat bmet in AM     Subjective: Reports breathing much better today  Physical Exam: Vitals:   12/11/21 0900 12/11/21 1000 12/11/21 1200 12/11/21 1400  BP: (!) 151/82 (!) 142/77    Pulse: 88 (!) 102 (!) 101 84  Resp: 20 (!) 22 (!) 24 20  Temp:      TempSrc:      SpO2: 95% 94% 92% 99%  Weight:      Height:       General exam: Conversant, in no acute distress Respiratory system: normal chest rise, clear, no audible  wheezing Cardiovascular system: regular rhythm, s1-s2 Gastrointestinal system: Nondistended, nontender, pos BS Central nervous system: No seizures, no tremors Extremities: No cyanosis, no joint deformities Skin: No rashes, no pallor Psychiatry: Affect normal // no auditory hallucinations   Data Reviewed:  Labs reviewed: 138, K 2.9, Cr 0.90, WBC 16.2  Family Communication: Pt in room, family not at bedside  Disposition: Status is: Inpatient Remains inpatient appropriate because: Severity of illness  Planned Discharge Destination: Home    Author: Marylu Lund, MD 12/11/2021 2:29 PM  For on call review [email or web address].

## 2021-12-12 DIAGNOSIS — J9601 Acute respiratory failure with hypoxia: Secondary | ICD-10-CM | POA: Diagnosis not present

## 2021-12-12 DIAGNOSIS — I5032 Chronic diastolic (congestive) heart failure: Secondary | ICD-10-CM | POA: Diagnosis not present

## 2021-12-12 DIAGNOSIS — J810 Acute pulmonary edema: Secondary | ICD-10-CM | POA: Diagnosis not present

## 2021-12-12 LAB — COMPREHENSIVE METABOLIC PANEL
ALT: 13 U/L (ref 0–44)
AST: 34 U/L (ref 15–41)
Albumin: 1.9 g/dL — ABNORMAL LOW (ref 3.5–5.0)
Alkaline Phosphatase: 149 U/L — ABNORMAL HIGH (ref 38–126)
Anion gap: 9 (ref 5–15)
BUN: 20 mg/dL (ref 8–23)
CO2: 28 mmol/L (ref 22–32)
Calcium: 8.3 mg/dL — ABNORMAL LOW (ref 8.9–10.3)
Chloride: 104 mmol/L (ref 98–111)
Creatinine, Ser: 0.91 mg/dL (ref 0.44–1.00)
GFR, Estimated: >60 mL/min (ref >60)
Glucose, Bld: 144 mg/dL — ABNORMAL HIGH (ref 70–99)
Potassium: 4.4 mmol/L (ref 3.5–5.1)
Sodium: 141 mmol/L (ref 135–145)
Total Bilirubin: 1 mg/dL (ref 0.3–1.2)
Total Protein: 5.5 g/dL — ABNORMAL LOW (ref 6.5–8.1)

## 2021-12-12 LAB — CBC
HCT: 32.3 % — ABNORMAL LOW (ref 36.0–46.0)
Hemoglobin: 10.4 g/dL — ABNORMAL LOW (ref 12.0–15.0)
MCH: 32.1 pg (ref 26.0–34.0)
MCHC: 32.2 g/dL (ref 30.0–36.0)
MCV: 99.7 fL (ref 80.0–100.0)
Platelets: 417 10*3/uL — ABNORMAL HIGH (ref 150–400)
RBC: 3.24 MIL/uL — ABNORMAL LOW (ref 3.87–5.11)
RDW: 14.7 % (ref 11.5–15.5)
WBC: 15.8 10*3/uL — ABNORMAL HIGH (ref 4.0–10.5)
nRBC: 0 % (ref 0.0–0.2)

## 2021-12-12 MED ORDER — PROCHLORPERAZINE EDISYLATE 10 MG/2ML IJ SOLN
10.0000 mg | INTRAMUSCULAR | Status: DC | PRN
  Administered 2021-12-12: 10 mg via INTRAVENOUS
  Filled 2021-12-12 (×2): qty 2

## 2021-12-12 MED ORDER — FUROSEMIDE 10 MG/ML IJ SOLN
60.0000 mg | Freq: Two times a day (BID) | INTRAMUSCULAR | Status: DC
  Administered 2021-12-12 – 2021-12-15 (×7): 60 mg via INTRAVENOUS
  Filled 2021-12-12 (×7): qty 6

## 2021-12-12 MED ORDER — POLYETHYLENE GLYCOL 3350 17 G PO PACK
17.0000 g | PACK | Freq: Every day | ORAL | Status: DC | PRN
  Administered 2021-12-12: 17 g via ORAL
  Filled 2021-12-12: qty 1

## 2021-12-12 NOTE — Progress Notes (Signed)
  Progress Note   Patient: STEELE LEDONNE Garrett:[id number] DOB: 17-Nov-1950 DOA: 12/09/2021     2 DOS: the patient was seen and examined on 12/12/2021   Brief hospital course: 71 y.o. female with medical history significant for chronic diastolic heart failure, hyperlipidemia, GERD, depression, chronic anemia associated with baseline hemoglobin 9-11, who is admitted to [facility] on 12/09/2021 with acute hypoxic respiratory failure after presenting to [facility] ED complaining of shortness of breath.  Assessment and Plan: Acute Hypoxemic Respiratory Failure - Suspect secondary to PE, acutely decompensated diastolic CHF vs PNA per below -VQ scan performed and reviewed. Findings of wedge-shaped peripheral perfusion defect in RUL -Presenting CXR with findings of cardiomegaly with diffuse interstitial and alveolar opacities consistent with CHF -2d echo reviewed, RV function normal -Given trial of lasix with improvement. Will give another trial of lasix -LE dopplers ordered and reviewed, neg for DVT -Initially on heparin gtt, now transitioned to eliquis -O2 requirements initially 12L high flow, weaning O2 as tolerated. Pt reports notable improvement with lasix. Will continue  Pulmonary Embolus -Reviewed VQ scan with findings per above -LE dopplers neg for DVT -Now on eliquis  Chronic diastolic CHF -Clinically improving with lasix -O2 requirement initially 12L high flow, continue to wean O2 as tolerated  Depression -Seems stable at this time  GERD -appears to be stable currently  PNA -Presented with WBC 16.2. Afebrile -Intially continued on vanc and cefepime. -MRSA swab neg with vanc d/c'd -Cont to follow CBC trends  Hypokalemia -Replaced -Cont to follow bmet trends, replace as needed     Subjective: States feeling more sob upon awakening, improved shortly after receiving dose of lasix  Physical Exam: Vitals:   12/12/21 0500 12/12/21 0600 12/12/21 0700 12/12/21 0800  BP:  (!)  129/59 (!) 146/100 (!) 128/53  Pulse:  90 86 (!) 108  Resp:  (!) 23 (!) 21 (!) 27  Temp:    97.6 F (36.4 C)  TempSrc:    Oral  SpO2:  92% 97% 95%  Weight: 53.4 kg     Height:       General exam: Awake, laying in bed, in nad Respiratory system: Normal respiratory effort, no wheezing Cardiovascular system: regular rate, s1, s2 Gastrointestinal system: Soft, nondistended, positive BS Central nervous system: CN2-12 grossly intact, strength intact Extremities: Perfused, no clubbing Skin: Normal skin turgor, no notable skin lesions seen Psychiatry: Mood normal // no visual hallucinations   Data Reviewed:  Labs reviewed: 141, K 4.4, Cr 0.91, WBC 15.8  Family Communication: Pt in room, family not at bedside  Disposition: Status is: Inpatient Remains inpatient appropriate because: Severity of illness  Planned Discharge Destination: Home    Author: Marylu Lund, MD 12/12/2021 10:30 AM  For on call review [email or web address].

## 2021-12-12 NOTE — Progress Notes (Signed)
Report given. Pt's belongings collected. Pt stable at time of transfer

## 2021-12-12 NOTE — Discharge Instructions (Addendum)
Information on my medicine - ELIQUIS (apixaban)   Why was Eliquis prescribed for you? Eliquis was prescribed to treat blood clots that may have been found in the veins of your legs (deep vein thrombosis) or in your lungs (pulmonary embolism) and to reduce the risk of them occurring again.  What do You need to know about Eliquis ? The starting dose is 10 mg (two 5 mg tablets) taken TWICE daily for the FIRST SEVEN (7) DAYS, then on the evening of 6/10  the dose is reduced to ONE 5 mg tablet taken TWICE daily.  Eliquis may be taken with or without food.   Try to take the dose about the same time in the morning and in the evening. If you have difficulty swallowing the tablet whole please discuss with your pharmacist how to take the medication safely.  Take Eliquis exactly as prescribed and DO NOT stop taking Eliquis without talking to the doctor who prescribed the medication.  Stopping may increase your risk of developing a new blood clot.  Refill your prescription before you run out.  After discharge, you should have regular check-up appointments with your healthcare provider that is prescribing your Eliquis.    What do you do if you miss a dose? If a dose of ELIQUIS is not taken at the scheduled time, take it as soon as possible on the same day and twice-daily administration should be resumed. The dose should not be doubled to make up for a missed dose.  Important Safety Information A possible side effect of Eliquis is bleeding. You should call your healthcare provider right away if you experience any of the following: Bleeding from an injury or your nose that does not stop. Unusual colored urine (red or dark brown) or unusual colored stools (red or black). Unusual bruising for unknown reasons. A serious fall or if you hit your head (even if there is no bleeding).  Some medicines may interact with Eliquis and might increase your risk of bleeding or clotting while on Eliquis. To  help avoid this, consult your healthcare provider or pharmacist prior to using any new prescription or non-prescription medications, including herbals, vitamins, non-steroidal anti-inflammatory drugs (NSAIDs) and supplements.  This website has more information on Eliquis (apixaban): http://www.eliquis.com/eliquis/home

## 2021-12-12 NOTE — Progress Notes (Signed)
Pharmacy Antibiotic Note  Heather Garrett is a 71 y.o. female admitted on 12/09/2021 with with a past medical history significant for recent admission for UTI and pneumonia currently on antibiotics, prolonged QT syndrome, previous MI, previous bladder and breast cancer, gastric bypass surgery, hyperlipidemia, hypertension, and migraines who presented with hypoxia, fatigue, chills, shortness of breath, and worsening cough.  Pharmacy remains consulted for cefepime dosing.  Plan: Continue Cefepime 2gm IV q12h Need for further dosage adjustment appears unlikely at present, so pharmacy will sign off at this time and provide further renal adjustments per protocol.  Please reconsult if a change in clinical status warrants re-evaluation of dosage.  Height: '4\' 8"'$  (142.2 cm) Weight: 53.4 kg (117 lb 11.6 oz) IBW/kg (Calculated) : 36.3  Temp (24hrs), Avg:98.1 F (36.7 C), Min:97.9 F (36.6 C), Max:98.3 F (36.8 C)  Recent Labs  Lab 12/09/21 2330 12/10/21 0546 12/11/21 0928 12/12/21 0301  WBC 17.4* 17.2* 16.2* 15.8*  CREATININE 0.82 0.80 0.90 0.91  LATICACIDVEN 1.2  --   --   --      Estimated Creatinine Clearance: 38.6 mL/min (by C-G formula based on SCr of 0.91 mg/dL).    Allergies  Allergen Reactions   Contrast Media [Iodinated Contrast Media] Anaphylaxis and Hives   Ciprofloxacin Other (See Comments)    Interaction w other medicine   Erythromycin Base Nausea And Vomiting   Sulfabenzamide Other (See Comments)    UNK reaction    Antimicrobials this admission: 6/1 vanc >>6/2 6/1 cefepime >>   Microbiology results: 6/2 MRSA PCR: not detected  Thank you for allowing pharmacy to be a part of this patient's care.  Royetta Asal, PharmD, BCPS Clinical Pharmacist  Please utilize Amion for appropriate phone number to reach the unit pharmacist ([place]) 12/12/2021 7:25 AM

## 2021-12-13 ENCOUNTER — Ambulatory Visit: Payer: Medicare PPO | Admitting: Gastroenterology

## 2021-12-13 LAB — COMPREHENSIVE METABOLIC PANEL
ALT: 12 U/L (ref 0–44)
AST: 22 U/L (ref 15–41)
Albumin: 2.1 g/dL — ABNORMAL LOW (ref 3.5–5.0)
Alkaline Phosphatase: 154 U/L — ABNORMAL HIGH (ref 38–126)
Anion gap: 8 (ref 5–15)
BUN: 23 mg/dL (ref 8–23)
CO2: 32 mmol/L (ref 22–32)
Calcium: 8.5 mg/dL — ABNORMAL LOW (ref 8.9–10.3)
Chloride: 102 mmol/L (ref 98–111)
Creatinine, Ser: 0.76 mg/dL (ref 0.44–1.00)
GFR, Estimated: >60 mL/min (ref >60)
Glucose, Bld: 92 mg/dL (ref 70–99)
Potassium: 3.8 mmol/L (ref 3.5–5.1)
Sodium: 142 mmol/L (ref 135–145)
Total Bilirubin: 0.8 mg/dL (ref 0.3–1.2)
Total Protein: 5.5 g/dL — ABNORMAL LOW (ref 6.5–8.1)

## 2021-12-13 LAB — CBC
HCT: 31.9 % — ABNORMAL LOW (ref 36.0–46.0)
Hemoglobin: 10 g/dL — ABNORMAL LOW (ref 12.0–15.0)
MCH: 31.8 pg (ref 26.0–34.0)
MCHC: 31.3 g/dL (ref 30.0–36.0)
MCV: 101.6 fL — ABNORMAL HIGH (ref 80.0–100.0)
Platelets: 370 10*3/uL (ref 150–400)
RBC: 3.14 MIL/uL — ABNORMAL LOW (ref 3.87–5.11)
RDW: 14.6 % (ref 11.5–15.5)
WBC: 14.5 10*3/uL — ABNORMAL HIGH (ref 4.0–10.5)
nRBC: 0 % (ref 0.0–0.2)

## 2021-12-13 LAB — LEGIONELLA PNEUMOPHILA SEROGP 1 UR AG: L. pneumophila Serogp 1 Ur Ag: NEGATIVE

## 2021-12-13 MED ORDER — MELATONIN 3 MG PO TABS
3.0000 mg | ORAL_TABLET | Freq: Once | ORAL | Status: AC
Start: 2021-12-13 — End: 2021-12-13
  Administered 2021-12-13: 3 mg via ORAL
  Filled 2021-12-13: qty 1

## 2021-12-13 MED ORDER — MELATONIN 3 MG PO TABS
3.0000 mg | ORAL_TABLET | Freq: Once | ORAL | Status: AC
  Administered 2021-12-13: 3 mg via ORAL
  Filled 2021-12-13: qty 1

## 2021-12-13 MED ORDER — CARVEDILOL 3.125 MG PO TABS
3.1250 mg | ORAL_TABLET | Freq: Two times a day (BID) | ORAL | Status: DC
  Administered 2021-12-13 – 2021-12-17 (×8): 3.125 mg via ORAL
  Filled 2021-12-13 (×8): qty 1

## 2021-12-13 NOTE — Evaluation (Signed)
Physical Therapy Evaluation Patient Details Name: Heather Garrett MRN: [id number] DOB: 07-01-1951 Today's Date: 12/13/2021  History of Present Illness  Pt is a 71 y.o. female with medical history significant for chronic diastolic heart failure, hyperlipidemia, GERD, depression, chronic anemia associated with baseline hemoglobin 9-11, L THA for femoral neck fx 08/2021, L pubic rami fx 2/23, falls, CAD and who is admitted to [facility] on 12/09/2021 with acute hypoxic respiratory failure due to PE and chronic diastolic CHF  Clinical Impression  Pt admitted with above diagnosis.  Pt currently with functional limitations due to the deficits listed below (see PT Problem List). Pt will benefit from skilled PT to increase their independence and safety with mobility to allow discharge to the venue listed below.    Pt admitted from SNF (for rehab) and reports working with physical therapy on strengthening and safe transfers.  Pt just weaned to 2L O2 [place] today so kept oxygen in place for mobility and SPO2 90% on 2L O2 after transferring from bed to recliner.  Pt performed a few exercises upon sitting in recliner and reports fatigue.  Pt reports breathing overall much improved since admission.  Pt requiring at least mod assist for mobility at this time.  Recommend pt return to SNF to complete rehab prior to d/c home.      Recommendations for follow up therapy are one component of a multi-disciplinary discharge planning process, led by the attending physician.  Recommendations may be updated based on patient status, additional functional criteria and insurance authorization.  Follow Up Recommendations Skilled nursing-short term rehab (<3 hours/day)    Assistance Recommended at Discharge Frequent or constant Supervision/Assistance  Patient can return home with the following  Two people to help with walking and/or transfers;Two people to help with bathing/dressing/bathroom;Assistance with  cooking/housework;Assist for transportation;Help with stairs or ramp for entrance    Equipment Recommendations None recommended by PT  Recommendations for Other Services       Functional Status Assessment Patient has had a recent decline in their functional status and demonstrates the ability to make significant improvements in function in a reasonable and predictable amount of time.     Precautions / Restrictions Precautions Precautions: Fall Precaution Comments: has Left knee sleeve and PRAFO from previous admission however not in use on arrival      Mobility  Bed Mobility Overal bed mobility: Needs Assistance Bed Mobility: Supine to Sit     Supine to sit: Min assist, HOB elevated     General bed mobility comments: Increased time/effort for pt, required assist for trunk upright    Transfers Overall transfer level: Needs assistance Equipment used: Rolling walker (2 wheels) Transfers: Sit to/from Stand, Bed to chair/wheelchair/BSC Sit to Stand: Mod assist, Min assist Stand pivot transfers: Min assist         General transfer comment: verbal cues for technique and weight shifting, pt has posterior bias upon standing and limiting weight bearing on L LE, assist to rise and for stability in standing and transferring; SpO2 905 on 2L O2  after transfer    Ambulation/Gait               General Gait Details: pt declined attempting today has she had not even ambulated at SNF yet, also fatigued after transfer  Stairs            Wheelchair Mobility    Modified Rankin (Stroke Patients Only)       Balance  Pertinent Vitals/Pain Pain Assessment Pain Assessment: Faces Faces Pain Scale: Hurts a little bit Pain Location: left ankle with weight bearing - pt felt a pop however ROM not painful upon sitting Pain Descriptors / Indicators: Tender Pain Intervention(s): Repositioned, Monitored during  session    Home Living Family/patient expects to be discharged to:: Private residence Living Arrangements: Spouse/significant other Available Help at Discharge: Family Type of Home: House Home Access: Stairs to enter;Other (comment) (garage is flat entrance)       Home Layout: One level Home Equipment: Conservation officer, nature (2 wheels);Rollator (4 wheels);Wheelchair - manual;BSC/3in1 Additional Comments: from previous admission; pt discharged to SNF and then readmitted from SNF this admission    Prior Function Prior Level of Function : Needs assist       Physical Assist : ADLs (physical);Mobility (physical)   ADLs (physical): Dressing Mobility Comments: Pt walked with walker PTA since hip surgery in 08/2021; pt reports only working on transfer upon d/c to SNF last admission ADLs Comments: Pt's husband assists with dressing LE incluing pants and depends as well as socks and shoes since hip surgery.     Hand Dominance   Dominant Hand: Right    Extremity/Trunk Assessment        Lower Extremity Assessment Lower Extremity Assessment: Generalized weakness;LLE deficits/detail LLE Deficits / Details: left ankle limited active ROM, PRAFO in room from previous admission and pt aware to float heel; ankle rests in increased PF, Left knee a little more edematous compared to right knee (fall prior to last admission which pt reports pain in L LE has improved)    Cervical / Trunk Assessment Cervical / Trunk Assessment: Kyphotic  Communication   Communication: No difficulties  Cognition Arousal/Alertness: Awake/alert Behavior During Therapy: WFL for tasks assessed/performed Overall Cognitive Status: Within Functional Limits for tasks assessed                                          General Comments      Exercises General Exercises - Lower Extremity Ankle Circles/Pumps: AROM, Both, 10 reps Long Arc Quad: AROM, Both, 10 reps, Seated Heel Slides: AROM, Both, 10 reps Hip  ABduction/ADduction: AROM, Both, 10 reps Hip Flexion/Marching: AROM, Seated, Both, 10 reps   Assessment/Plan    PT Assessment Patient needs continued PT services  PT Problem List Decreased strength;Decreased mobility;Decreased range of motion;Decreased activity tolerance;Decreased balance;Decreased knowledge of use of DME       PT Treatment Interventions DME instruction;Gait training;Therapeutic activities;Therapeutic exercise;Patient/family education;Balance training;Functional mobility training    PT Goals (Current goals can be found in the Care Plan section)  Acute Rehab PT Goals PT Goal Formulation: With patient Time For Goal Achievement: 12/27/21 Potential to Achieve Goals: Good    Frequency Min 2X/week     Co-evaluation               AM-PAC PT "6 Clicks" Mobility  Outcome Measure Help needed turning from your back to your side while in a flat bed without using bedrails?: A Little Help needed moving from lying on your back to sitting on the side of a flat bed without using bedrails?: A Lot Help needed moving to and from a bed to a chair (including a wheelchair)?: A Lot Help needed standing up from a chair using your arms (e.g., wheelchair or bedside chair)?: A Lot Help needed to walk in hospital room?: Total Help needed  climbing 3-5 steps with a railing? : Total 6 Click Score: 11    End of Session Equipment Utilized During Treatment: Gait belt Activity Tolerance: Patient tolerated treatment well Patient left: in chair;with call bell/phone within reach (aware to use call bell for OOB)   PT Visit Diagnosis: Difficulty in walking, not elsewhere classified (R26.2);Muscle weakness (generalized) (M62.81);History of falling (Z91.81)    Time: 2763-9432 PT Time Calculation (min) (ACUTE ONLY): 16 min   Charges:   PT Evaluation $PT Eval Low Complexity: 1 Low         Kati PT, DPT Acute Rehabilitation Services Pager: [phone] Office: [place] 12/13/2021, 3:34 PM

## 2021-12-13 NOTE — TOC Initial Note (Signed)
Transition of Care [facility]) - Initial/Assessment Note    Patient Details  Name: Heather Garrett MRN: [id number] Date of Birth: November 03, 1950  [facility]) CM/SW Contact:    Dessa Phi, RN Phone Number: 12/13/2021, 3:40 PM  Clinical Narrative: From [facility] to return. Await PT recc.                  Expected Discharge Plan: Skilled Nursing Facility Barriers to Discharge: Continued Medical Work up   Patient Goals and CMS Choice Patient states their goals for this hospitalization and ongoing recovery are:: Retrun back to SNF CMS Medicare.gov Compare Post Acute Care list provided to:: Patient Represenative (must comment) [name] spouse)    Expected Discharge Plan and Services Expected Discharge Plan: [place]   Discharge Planning Services: CM Consult                                          Prior Living Arrangements/Services   Lives with:: Spouse Patient language and need for interpreter reviewed:: Yes Do you feel safe going back to the place where you live?: Yes      Need for Family Participation in Patient Care: Yes (Comment) Care giver support system in place?: Yes (comment)   Criminal Activity/Legal Involvement Pertinent to Current Situation/Hospitalization: No - Comment as needed  Activities of Daily Living Home Assistive Devices/Equipment: Cane (specify quad or straight) ADL Screening (condition at time of admission) Patient's cognitive ability adequate to safely complete daily activities?: Yes Is the patient deaf or have difficulty hearing?: No Does the patient have difficulty seeing, even when wearing glasses/contacts?: No Does the patient have difficulty concentrating, remembering, or making decisions?: No Patient able to express need for assistance with ADLs?: Yes Does the patient have difficulty dressing or bathing?: No Independently performs ADLs?: Yes (appropriate for developmental age) Does the patient have  difficulty walking or climbing stairs?: Yes Weakness of Legs: Left Weakness of Arms/Hands: None  Permission Sought/Granted Permission sought to share information with : Case Manager Permission granted to share information with : Yes, Verbal Permission Granted              Emotional Assessment Appearance:: Appears stated age Attitude/Demeanor/Rapport: Gracious Affect (typically observed): Accepting        Admission diagnosis:  Acute pulmonary edema ([place]) [J81.0] SOB (shortness of breath) [R06.02] Hypoxia [R09.02] Acute respiratory failure with hypoxia ([place]) [J96.01] Community acquired pneumonia, unspecified laterality [J18.9] Patient Active Problem List   Diagnosis Date Noted   Acute respiratory failure with hypoxia ([place]) 12/10/2021   Severe sepsis ([place]) 12/10/2021   Multifocal pneumonia 12/10/2021   GERD (gastroesophageal reflux disease) 12/10/2021   Depression 12/10/2021   Chronic diastolic CHF (congestive heart failure) ([place]) 12/10/2021   Chronic anemia 12/10/2021   UTI (urinary tract infection) 11/28/2021   Prolonged QT interval 11/28/2021   Normocytic anemia 09/05/2021   Hyperkalemia 09/05/2021   Triggering of finger 05/17/2021   Heart disease 05/17/2021   Malignant tumor of breast ([place]) 05/17/2021   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[place]) 05/17/2021   Hypertensive disorder 05/17/2021   Lower extremity edema 05/14/2021   Benign paroxysmal positional vertigo of left ear 03/23/2021   Daytime somnolence 03/23/2021   History of sleep apnea 03/23/2021   Orthostatic hypotension 03/23/2021   Migraine without aura and without status migrainosus, not intractable 03/23/2021   Coronary arteriosclerosis in native artery 09/28/2016  Elevated LFTs 09/28/2016   Rotator cuff syndrome 09/28/2016   Biceps tendinitis 09/28/2016   Cobalamin deficiency 09/28/2016   Hyperlipidemia 09/28/2016   Migraine 09/28/2016   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

## 2021-12-13 NOTE — Progress Notes (Signed)
  Progress Note   Patient: Heather Garrett MOQ:[id number] DOB: [birth year] DOA: 12/09/2021     3 DOS: the patient was seen and examined on 12/13/2021   Brief hospital course: 71 y.o. female with medical history significant for chronic diastolic heart failure, hyperlipidemia, GERD, depression, chronic anemia associated with baseline hemoglobin 9-11, who is admitted to [facility] on 12/09/2021 with acute hypoxic respiratory failure after presenting to [facility] ED complaining of shortness of breath.  Assessment and Plan: Acute Hypoxemic Respiratory Failure - Suspect secondary to PE, acutely decompensated diastolic CHF vs PNA per below -VQ scan performed and reviewed. Findings of wedge-shaped peripheral perfusion defect in RUL -Presenting CXR with findings of cardiomegaly with diffuse interstitial and alveolar opacities consistent with CHF -2d echo reviewed, RV function normal -Given trial of lasix with improvement. Will give another trial of lasix -LE dopplers ordered and reviewed, neg for DVT -Initially on heparin gtt, now transitioned to eliquis -O2 requirements initially 12L high flow, weaning O2 to [facility] today. Pt reports notable improvement with lasix. Will continue  Pulmonary Embolus -Reviewed VQ scan with findings per above -LE dopplers neg for DVT -Now on eliquis  Chronic diastolic CHF -Clinically improving with lasix -O2 requirement initially 12L high flow, weaned to [facility] today  Depression -Seems stable at this time  GERD -appears to be stable currently  PNA -Presented with WBC 16.2. Afebrile -Intially continued on vanc and cefepime. -MRSA swab neg with vanc d/c'd -Cont to follow CBC trends  Hypokalemia -Replaced -Cont to follow bmet trends, replace as needed     Subjective: Reports breathing much better today  Physical Exam: Vitals:   12/13/21 0937 12/13/21 1012 12/13/21 1213 12/13/21 1352  BP:    122/78  Pulse:    88  Resp:    18  Temp:    99 F (37.2 C)   TempSrc:    Oral  SpO2: 98% 97% 95% 96%  Weight:      Height:       General exam: Conversant, in no acute distress Respiratory system: normal chest rise, clear, no audible wheezing Cardiovascular system: regular rhythm, s1-s2 Gastrointestinal system: Nondistended, nontender, pos BS Central nervous system: No seizures, no tremors Extremities: No cyanosis, no joint deformities Skin: No rashes, no pallor Psychiatry: Affect normal // no auditory hallucinations   Data Reviewed:  Labs reviewed: 142, K 3.8, Cr 0.76, WBC 14.5  Family Communication: Pt in room, family not at bedside  Disposition: Status is: Inpatient Remains inpatient appropriate because: Severity of illness  Planned Discharge Destination: Home    Author: Marylu Lund, MD 12/13/2021 1:53 PM  For on call review [email or web address].

## 2021-12-14 DIAGNOSIS — J189 Pneumonia, unspecified organism: Secondary | ICD-10-CM | POA: Diagnosis not present

## 2021-12-14 DIAGNOSIS — D649 Anemia, unspecified: Secondary | ICD-10-CM | POA: Diagnosis not present

## 2021-12-14 DIAGNOSIS — J9601 Acute respiratory failure with hypoxia: Secondary | ICD-10-CM | POA: Diagnosis not present

## 2021-12-14 LAB — COMPREHENSIVE METABOLIC PANEL
ALT: 11 U/L (ref 0–44)
AST: 20 U/L (ref 15–41)
Albumin: 2 g/dL — ABNORMAL LOW (ref 3.5–5.0)
Alkaline Phosphatase: 160 U/L — ABNORMAL HIGH (ref 38–126)
Anion gap: 10 (ref 5–15)
BUN: 25 mg/dL — ABNORMAL HIGH (ref 8–23)
CO2: 32 mmol/L (ref 22–32)
Calcium: 8.1 mg/dL — ABNORMAL LOW (ref 8.9–10.3)
Chloride: 95 mmol/L — ABNORMAL LOW (ref 98–111)
Creatinine, Ser: 0.84 mg/dL (ref 0.44–1.00)
GFR, Estimated: >60 mL/min (ref >60)
Glucose, Bld: 97 mg/dL (ref 70–99)
Potassium: 2.8 mmol/L — ABNORMAL LOW (ref 3.5–5.1)
Sodium: 137 mmol/L (ref 135–145)
Total Bilirubin: 0.6 mg/dL (ref 0.3–1.2)
Total Protein: 5.7 g/dL — ABNORMAL LOW (ref 6.5–8.1)

## 2021-12-14 LAB — CBC
HCT: 33.8 % — ABNORMAL LOW (ref 36.0–46.0)
Hemoglobin: 10.6 g/dL — ABNORMAL LOW (ref 12.0–15.0)
MCH: 31.4 pg (ref 26.0–34.0)
MCHC: 31.4 g/dL (ref 30.0–36.0)
MCV: 100 fL (ref 80.0–100.0)
Platelets: 380 10*3/uL (ref 150–400)
RBC: 3.38 MIL/uL — ABNORMAL LOW (ref 3.87–5.11)
RDW: 14.4 % (ref 11.5–15.5)
WBC: 16 10*3/uL — ABNORMAL HIGH (ref 4.0–10.5)
nRBC: 0 % (ref 0.0–0.2)

## 2021-12-14 LAB — MAGNESIUM: Magnesium: 1.6 mg/dL — ABNORMAL LOW (ref 1.7–2.4)

## 2021-12-14 LAB — MYCOPLASMA PNEUMONIAE ANTIBODY, IGM: Mycoplasma pneumo IgM: <770 U/mL (ref 0–769)

## 2021-12-14 MED ORDER — POTASSIUM CHLORIDE CRYS ER 20 MEQ PO TBCR
60.0000 meq | EXTENDED_RELEASE_TABLET | ORAL | Status: AC
  Administered 2021-12-14 (×2): 60 meq via ORAL
  Filled 2021-12-14 (×2): qty 3

## 2021-12-14 MED ORDER — MELATONIN 3 MG PO TABS
3.0000 mg | ORAL_TABLET | Freq: Once | ORAL | Status: AC
  Administered 2021-12-14: 3 mg via ORAL
  Filled 2021-12-14: qty 1

## 2021-12-14 MED ORDER — MAGNESIUM SULFATE 4 GM/100ML IV SOLN
4.0000 g | Freq: Once | INTRAVENOUS | Status: AC
Start: 2021-12-14 — End: 2021-12-14
  Administered 2021-12-14: 4 g via INTRAVENOUS
  Filled 2021-12-14: qty 100

## 2021-12-14 NOTE — Progress Notes (Addendum)
  Progress Note   Patient: Heather Garrett [id number]:[id number] DOB: 06-08-1951 DOA: 12/09/2021     4 DOS: the patient was seen and examined on 12/14/2021   Brief hospital course: 71 y.o. female with medical history significant for chronic diastolic heart failure, hyperlipidemia, GERD, depression, chronic anemia associated with baseline hemoglobin 9-11, who is admitted to [facility] on 12/09/2021 with acute hypoxic respiratory failure after presenting to [facility] ED complaining of shortness of breath.  Assessment and Plan: Acute Hypoxemic Respiratory Failure - Suspect secondary to PE, acutely decompensated diastolic CHF vs PNA per below -VQ scan performed and reviewed. Findings of wedge-shaped peripheral perfusion defect in RUL -Presenting CXR with findings of cardiomegaly with diffuse interstitial and alveolar opacities consistent with CHF -2d echo reviewed, RV function normal -LE dopplers ordered and reviewed, neg for DVT -Initially on heparin gtt, now transitioned to eliquis -O2 requirements initially 12L high flow, weaned to [facility]. Improving with lasix, diuresing well  Pulmonary Embolus -Reviewed VQ scan with findings per above -LE dopplers neg for DVT -Now on eliquis  Acute on Chronic diastolic CHF -O2 requirement initially 12L high flow, weaned to [facility] today -Improving with lasix -cont to follow I/o -recheck bmet in AM  Depression -Seems stable at this time  GERD -appears to be stable currently  PNA -Presented with WBC 16.2. Afebrile -Intially continued on vanc and cefepime. -MRSA swab neg with vanc d/c'd -Would complete total 5 days of cefepime, to complete 6/6 -Cont to follow CBC trends  Hypokalemia -Replaced -Cont to follow bmet   Hypomagnesemia -Replaced     Subjective: States feeling better   Physical Exam: Vitals:   12/13/21 2002 12/14/21 0500 12/14/21 0645 12/14/21 1340  BP: 126/74  (!) 130/95 106/76  Pulse: 98  85 89  Resp: (!) 21  18   Temp: 98.7 F  (37.1 C)  98.8 F (37.1 C) 99 F (37.2 C)  TempSrc: Oral  Oral Oral  SpO2: 93%  94% 94%  Weight:  50.5 kg    Height:       General exam: Awake, laying in bed, in nad Respiratory system: Normal respiratory effort, no wheezing Cardiovascular system: regular rate, s1, s2 Gastrointestinal system: Soft, nondistended, positive BS Central nervous system: CN2-12 grossly intact, strength intact Extremities: Perfused, no clubbing Skin: Normal skin turgor, no notable skin lesions seen Psychiatry: Mood normal // no visual hallucinations   Data Reviewed:  Labs reviewed: Na 137, K 2.8, Mg 1.6, Cr 0.84, WBC 16.0  Family Communication: Pt in room, family not at bedside  Disposition: Status is: Inpatient Remains inpatient appropriate because: Severity of illness  Planned Discharge Destination: Home and Skilled nursing facility    Author: Marylu Lund, MD 12/14/2021 2:27 PM  For on call review [email or web address].

## 2021-12-15 DIAGNOSIS — J9601 Acute respiratory failure with hypoxia: Secondary | ICD-10-CM | POA: Diagnosis not present

## 2021-12-15 LAB — COMPREHENSIVE METABOLIC PANEL
ALT: 13 U/L (ref 0–44)
AST: 26 U/L (ref 15–41)
Albumin: 2.3 g/dL — ABNORMAL LOW (ref 3.5–5.0)
Alkaline Phosphatase: 178 U/L — ABNORMAL HIGH (ref 38–126)
Anion gap: 9 (ref 5–15)
BUN: 33 mg/dL — ABNORMAL HIGH (ref 8–23)
CO2: 31 mmol/L (ref 22–32)
Calcium: 8.6 mg/dL — ABNORMAL LOW (ref 8.9–10.3)
Chloride: 96 mmol/L — ABNORMAL LOW (ref 98–111)
Creatinine, Ser: 1.04 mg/dL — ABNORMAL HIGH (ref 0.44–1.00)
GFR, Estimated: 57 mL/min — ABNORMAL LOW (ref >60)
Glucose, Bld: 125 mg/dL — ABNORMAL HIGH (ref 70–99)
Potassium: 5.1 mmol/L (ref 3.5–5.1)
Sodium: 136 mmol/L (ref 135–145)
Total Bilirubin: 0.5 mg/dL (ref 0.3–1.2)
Total Protein: 6.1 g/dL — ABNORMAL LOW (ref 6.5–8.1)

## 2021-12-15 LAB — CBC
HCT: 35.3 % — ABNORMAL LOW (ref 36.0–46.0)
Hemoglobin: 11 g/dL — ABNORMAL LOW (ref 12.0–15.0)
MCH: 31.3 pg (ref 26.0–34.0)
MCHC: 31.2 g/dL (ref 30.0–36.0)
MCV: 100.3 fL — ABNORMAL HIGH (ref 80.0–100.0)
Platelets: 430 10*3/uL — ABNORMAL HIGH (ref 150–400)
RBC: 3.52 MIL/uL — ABNORMAL LOW (ref 3.87–5.11)
RDW: 14.6 % (ref 11.5–15.5)
WBC: 15.6 10*3/uL — ABNORMAL HIGH (ref 4.0–10.5)
nRBC: 0 % (ref 0.0–0.2)

## 2021-12-15 LAB — BRAIN NATRIURETIC PEPTIDE: B Natriuretic Peptide: 144.3 pg/mL — ABNORMAL HIGH (ref 0.0–100.0)

## 2021-12-15 LAB — PROCALCITONIN: Procalcitonin: 0.3 ng/mL

## 2021-12-15 MED ORDER — SENNOSIDES-DOCUSATE SODIUM 8.6-50 MG PO TABS: 1.0000 | ORAL_TABLET | Freq: Every evening | ORAL | Status: DC | PRN

## 2021-12-15 MED ORDER — IBUPROFEN 200 MG PO TABS
600.0000 mg | ORAL_TABLET | Freq: Once | ORAL | Status: AC
Start: 2021-12-15 — End: 2021-12-15
  Administered 2021-12-15: 600 mg via ORAL
  Filled 2021-12-15: qty 3

## 2021-12-15 MED ORDER — METOPROLOL TARTRATE 5 MG/5ML IV SOLN: 5.0000 mg | INTRAVENOUS | Status: DC | PRN

## 2021-12-15 MED ORDER — TRAZODONE HCL 50 MG PO TABS: 50.0000 mg | ORAL_TABLET | Freq: Every evening | ORAL | Status: DC | PRN

## 2021-12-15 MED ORDER — OXYCODONE HCL 5 MG PO TABS
5.0000 mg | ORAL_TABLET | Freq: Once | ORAL | Status: AC
  Administered 2021-12-15: 5 mg via ORAL
  Filled 2021-12-15: qty 1

## 2021-12-15 MED ORDER — GUAIFENESIN 100 MG/5ML PO LIQD: 5.0000 mL | ORAL | Status: DC | PRN

## 2021-12-15 MED ORDER — HYDRALAZINE HCL 20 MG/ML IJ SOLN: 10.0000 mg | INTRAMUSCULAR | Status: DC | PRN

## 2021-12-15 MED ORDER — IPRATROPIUM-ALBUTEROL 0.5-2.5 (3) MG/3ML IN SOLN: 3.0000 mL | RESPIRATORY_TRACT | Status: DC | PRN

## 2021-12-15 MED ORDER — FUROSEMIDE 10 MG/ML IJ SOLN: 40.0000 mg | Freq: Every day | INTRAMUSCULAR | Status: DC

## 2021-12-15 NOTE — Care Management Important Message (Signed)
Important Message  Patient Details IM Letter given to the Patient. Name: Heather Garrett MRN: [id number] Date of Birth: 30-Jun-1951   Medicare Important Message Given:  Yes     Kerin Salen 12/15/2021, 2:18 PM

## 2021-12-15 NOTE — Progress Notes (Signed)
Physical Therapy Treatment Patient Details Name: Heather Garrett MRN: [id number] DOB: 24-Sep-1950 Today's Date: 12/15/2021   History of Present Illness Pt is a 71 y.o. female with medical history significant for chronic diastolic heart failure, hyperlipidemia, GERD, depression, chronic anemia associated with baseline hemoglobin 9-11, L THA for femoral neck fx 08/2021, L pubic rami fx 2/23, falls, CAD and who is admitted to [facility] on 12/09/2021 with acute hypoxic respiratory failure due to PE and chronic diastolic CHF    PT Comments    Patient is making steady progress with mobility and required min assist for sit<>stands with good technique using bil UE for power up. Pt initiated gait training with close chair follow for safety. Min assist to guide walker and steady balance, pt has slight posterior lean with manual cues provided to improve posture. EOS pt returned to recliner and instructed on LE exercises. Will continue to progress as able in acute setting, recommend SNF rehab for follow up.     Recommendations for follow up therapy are one component of a multi-disciplinary discharge planning process, led by the attending physician.  Recommendations may be updated based on patient status, additional functional criteria and insurance authorization.  Follow Up Recommendations  Skilled nursing-short term rehab (<3 hours/day)     Assistance Recommended at Discharge Frequent or constant Supervision/Assistance  Patient can return home with the following Two people to help with walking and/or transfers;Assistance with cooking/housework;Assist for transportation;Help with stairs or ramp for entrance;A lot of help with bathing/dressing/bathroom   Equipment Recommendations  None recommended by PT    Recommendations for Other Services       Precautions / Restrictions Precautions Precautions: Fall Precaution Comments: has Left knee sleeve and PRAFO from previous admission however not  in use on arrival Restrictions Weight Bearing Restrictions: No LLE Weight Bearing: Weight bearing as tolerated     Mobility  Bed Mobility                    Transfers Overall transfer level: Needs assistance Equipment used: Rolling walker (2 wheels) Transfers: Sit to/from Stand Sit to Stand: Min assist           General transfer comment: pt usign bil UE to rise from recliner and reach back to control lowering. 4x sit<>stand this session    Ambulation/Gait Ambulation/Gait assistance: Min assist, +2 safety/equipment Gait Distance (Feet): 30 Feet (4, 6, 10, 10) Assistive device: Rolling walker (2 wheels) Gait Pattern/deviations: Decreased stride length, Step-through pattern, Trunk flexed, Shuffle, Decreased stance time - left, Decreased step length - right, Decreased weight shift to left Gait velocity: decr     General Gait Details: min assist to advance walker and facilitate anterior lean and progress pace. min assist to prevent posterior LOB intermittently. mild antalgia at Lt ankle and pt with reduced stance time on Lt and shortened Rt step length.   Stairs             Wheelchair Mobility    Modified Rankin (Stroke Patients Only)       Balance Overall balance assessment: Needs assistance, History of Falls Sitting-balance support: Feet supported, No upper extremity supported, Bilateral upper extremity supported Sitting balance-Leahy Scale: Good     Standing balance support: Bilateral upper extremity supported, Reliant on assistive device for balance, During functional activity Standing balance-Leahy Scale: Poor Standing balance comment: occassional posterior lean  Cognition Arousal/Alertness: Awake/alert Behavior During Therapy: WFL for tasks assessed/performed Overall Cognitive Status: Within Functional Limits for tasks assessed                                          Exercises General  Exercises - Lower Extremity Ankle Circles/Pumps: AROM, Both, 10 reps Long Arc Quad: AROM, Both, 10 reps, Seated    General Comments        Pertinent Vitals/Pain Pain Assessment Pain Assessment: Faces Faces Pain Scale: Hurts a little bit Pain Location: LT ankle with weight bearing Pain Descriptors / Indicators: Tender Pain Intervention(s): Limited activity within patient's tolerance, Monitored during session, Repositioned    Home Living                          Prior Function            PT Goals (current goals can now be found in the care plan section) Acute Rehab PT Goals Patient Stated Goal: less pain. regain PLOF/independence PT Goal Formulation: With patient Time For Goal Achievement: 12/27/21 Potential to Achieve Goals: Good Progress towards PT goals: Progressing toward goals    Frequency    Min 2X/week      PT Plan Current plan remains appropriate    Co-evaluation              AM-PAC PT "6 Clicks" Mobility   Outcome Measure  Help needed turning from your back to your side while in a flat bed without using bedrails?: A Little Help needed moving from lying on your back to sitting on the side of a flat bed without using bedrails?: A Little Help needed moving to and from a bed to a chair (including a wheelchair)?: A Little Help needed standing up from a chair using your arms (e.g., wheelchair or bedside chair)?: A Little Help needed to walk in hospital room?: A Little Help needed climbing 3-5 steps with a railing? : A Lot 6 Click Score: 17    End of Session Equipment Utilized During Treatment: Gait belt Activity Tolerance: Patient tolerated treatment well Patient left: in chair;with call bell/phone within reach;with chair alarm set Nurse Communication: Mobility status PT Visit Diagnosis: Difficulty in walking, not elsewhere classified (R26.2);Muscle weakness (generalized) (M62.81);History of falling (Z91.81) Pain - Right/Left: Left Pain -  part of body: Leg;Ankle and joints of foot     Time: 7902-4097 PT Time Calculation (min) (ACUTE ONLY): 20 min  Charges:  $Gait Training: 8-22 mins                     Verner Mould, DPT Acute Rehabilitation Services Office [phone] Pager [phone]  12/15/21 12:04 PM

## 2021-12-15 NOTE — Progress Notes (Signed)
PROGRESS NOTE    Heather Garrett  TXM:[id number] DOB: 19-Apr-1951 DOA: 12/09/2021 PCP: Selinda Orion   Brief Narrative:  71 y.o. female with medical history significant for chronic diastolic heart failure, hyperlipidemia, GERD, depression, chronic anemia associated with baseline hemoglobin 9-11, who is admitted to [facility] on 12/09/2021 with acute hypoxic respiratory failure after presenting to [facility] ED complaining of shortness of breath.  VQ scan showed peripheral perfusion defect in right upper lobe.  He was diagnosed with pulmonary embolism and diastolic CHF.  Currently patient is on Eliquis and getting diuretics.   Assessment & Plan:  Principal Problem:   Acute respiratory failure with hypoxia (HCC) Active Problems:   Prolonged QT interval   Severe sepsis (HCC)   Multifocal pneumonia   GERD (gastroesophageal reflux disease)   Depression   Chronic diastolic CHF (congestive heart failure) (HCC)   Chronic anemia     Assessment and Plan:  Acute Hypoxemic Respiratory Failure, improved - Initially requiring 10 L of high flow now weaned down to room air.  This is combination of pulmonary embolism and diastolic CHF.   Pulmonary Embolus w/o Cor Pulmonale -VQ scan head showed wedge-shaped peripheral perfusion defect in right upper lobe.  Lower extremity Dopplers negative for DVT.  2D echo overall looks normal.  Initially on heparin drip now on Eliquis twice daily   Acute on Chronic diastolic CHF -Currently on Lasix 60 mg IV twice daily > Lasix IV daily.  Coreg twice daily   Depression -Stable   GERD -PPI   PNA -Concerns of right upper lobe pneumonia.  MRSA swab is negative.  Initially treated with vancomycin and cefepime thereafter completed course of cefepime.   Hypokalemia -Repleted   Hypomagnesemia -Repleted.  Monitor as needed.   PT= SNF   DVT prophylaxis: Eliquis Code Status: Full code Family Communication:  [name] called, left a voicemail.    Subjective: Feels better, sitting up on the recline today.   Examination:  General exam: Appears calm and comfortable  Respiratory system: Clear to auscultation. Respiratory effort normal. Cardiovascular system: S1 & S2 heard, RRR. No JVD, murmurs, rubs, gallops or clicks. No pedal edema. Gastrointestinal system: Abdomen is nondistended, soft and nontender. No organomegaly or masses felt. Normal bowel sounds heard. Central nervous system: Alert and oriented. No focal neurological deficits. Extremities: Symmetric 5 x 5 power. Skin: No rashes, lesions or ulcers Psychiatry: Judgement and insight appear normal. Mood & affect appropriate.     Objective: Vitals:   12/14/21 2040 12/15/21 0447 12/15/21 0500 12/15/21 0813  BP: (!) 124/96 (!) 144/89    Pulse: 92 79    Resp: 19 18    Temp: 98.5 F (36.9 C) 98.3 F (36.8 C)    TempSrc: Oral Oral    SpO2: 98% 98%  93%  Weight:   49.7 kg   Height:        Intake/Output Summary (Last 24 hours) at 12/15/2021 0827 Last data filed at 12/15/2021 0800 Gross per 24 hour  Intake 680 ml  Output 1900 ml  Net -1220 ml   Filed Weights   12/13/21 0500 12/14/21 0500 12/15/21 0500  Weight: 52 kg 50.5 kg 49.7 kg     Data Reviewed:   CBC: Recent Labs  Lab 12/09/21 2330 12/10/21 0546 12/11/21 0928 12/12/21 0301 12/13/21 0437 12/14/21 0512 12/15/21 0441  WBC 17.4* 17.2* 16.2* 15.8* 14.5* 16.0* 15.6*  NEUTROABS 13.9* 13.4*  --   --   --   --   --  HGB 9.9* 10.3* 10.9* 10.4* 10.0* 10.6* 11.0*  HCT 31.4* 33.2* 34.6* 32.3* 31.9* 33.8* 35.3*  MCV 101.6* 101.8* 100.0 99.7 101.6* 100.0 100.3*  PLT 393 405* 391 417* 370 380 253*   Basic Metabolic Panel: Recent Labs  Lab 12/10/21 0546 12/11/21 0928 12/12/21 0301 12/13/21 0437 12/14/21 0512 12/15/21 0441  NA 139 138 141 142 137 136  K 3.6 2.9* 4.4 3.8 2.8* 5.1  CL 107 101 104 102 95* 96*  CO2 '25 27 28 '$ 32 32 31  GLUCOSE 101* 105* 144* 92 97 125*  BUN '21 20 20 23 '$ 25* 33*  CREATININE  0.80 0.90 0.91 0.76 0.84 1.04*  CALCIUM 8.5* 8.1* 8.3* 8.5* 8.1* 8.6*  MG 2.0  --   --   --  1.6*  --   PHOS 3.6  --   --   --   --   --    GFR: Estimated Creatinine Clearance: 32.7 mL/min (A) (by C-G formula based on SCr of 1.04 mg/dL (H)). Liver Function Tests: Recent Labs  Lab 12/11/21 0928 12/12/21 0301 12/13/21 0437 12/14/21 0512 12/15/21 0441  AST 22 34 '22 20 26  '$ ALT '14 13 12 11 13  '$ ALKPHOS 170* 149* 154* 160* 178*  BILITOT 0.7 1.0 0.8 0.6 0.5  PROT 6.0* 5.5* 5.5* 5.7* 6.1*  ALBUMIN 2.3* 1.9* 2.1* 2.0* 2.3*   Recent Labs  Lab 12/09/21 2330  LIPASE 22   No results for input(s): AMMONIA in the last 168 hours. Coagulation Profile: Recent Labs  Lab 12/10/21 1232  INR 1.2   Cardiac Enzymes: No results for input(s): CKTOTAL, CKMB, CKMBINDEX, TROPONINI in the last 168 hours. BNP (last 3 results) No results for input(s): PROBNP in the last 8760 hours. HbA1C: No results for input(s): HGBA1C in the last 72 hours. CBG: Recent Labs  Lab 12/10/21 0756  GLUCAP 104*   Lipid Profile: No results for input(s): CHOL, HDL, LDLCALC, TRIG, CHOLHDL, LDLDIRECT in the last 72 hours. Thyroid Function Tests: No results for input(s): TSH, T4TOTAL, FREET4, T3FREE, THYROIDAB in the last 72 hours. Anemia Panel: No results for input(s): VITAMINB12, FOLATE, FERRITIN, TIBC, IRON, RETICCTPCT in the last 72 hours. Sepsis Labs: Recent Labs  Lab 12/09/21 2330 12/10/21 0546  PROCALCITON  --  0.15  LATICACIDVEN 1.2  --     Recent Results (from the past 240 hour(s))  SARS Coronavirus 2 by RT PCR (hospital order, performed in [facility] hospital lab) *cepheid single result test* Anterior Nasal Swab     Status: None   Collection Time: 12/10/21 12:00 AM   Specimen: Anterior Nasal Swab  Result Value Ref Range Status   SARS Coronavirus 2 by RT PCR NEGATIVE NEGATIVE Final    Comment: (NOTE) SARS-CoV-2 target nucleic acids are NOT DETECTED.  The SARS-CoV-2 RNA is generally detectable in  upper and lower respiratory specimens during the acute phase of infection. The lowest concentration of SARS-CoV-2 viral copies this assay can detect is 250 copies / mL. A negative result does not preclude SARS-CoV-2 infection and should not be used as the sole basis for treatment or other patient management decisions.  A negative result may occur with improper specimen collection / handling, submission of specimen other than nasopharyngeal swab, presence of viral mutation(s) within the areas targeted by this assay, and inadequate number of viral copies (<250 copies / mL). A negative result must be combined with clinical observations, patient history, and epidemiological information.  Fact Sheet for Patients:   https://www.patel.info/  Fact Sheet for  Healthcare Providers: https://hall.com/  This test is not yet approved or  cleared by the [place] and has been authorized for detection and/or diagnosis of SARS-CoV-2 by FDA under an Emergency Use Authorization (EUA).  This EUA will remain in effect (meaning this test can be used) for the duration of the COVID-19 declaration under Section 564(b)(1) of the Act, 21 U.S.C. section 360bbb-3(b)(1), unless the authorization is terminated or revoked sooner.  Performed at [facility], [place] [address] [place], [place] [address]   MRSA Next Gen by PCR, Nasal     Status: None   Collection Time: 12/10/21  3:42 AM   Specimen: Nasal Mucosa; Nasal Swab  Result Value Ref Range Status   MRSA by PCR Next Gen NOT DETECTED NOT DETECTED Final    Comment: (NOTE) The GeneXpert MRSA Assay (FDA approved for NASAL specimens only), is one component of a comprehensive MRSA colonization surveillance program. It is not intended to diagnose MRSA infection nor to guide or monitor treatment for MRSA infections. Test performance is not FDA approved in patients less than 72 years old. Performed  at [facility], [place] [address] [place], [place] [address]          Radiology Studies: No results found.      Scheduled Meds:  apixaban  10 mg Oral BID   Followed by   Derrill Memo ON 12/18/2021] apixaban  5 mg Oral BID   aspirin EC  81 mg Oral Daily   carvedilol  3.125 mg Oral BID   furosemide  60 mg Intravenous BID   mouth rinse  15 mL Mouth Rinse BID   pantoprazole  40 mg Oral BID AC   Continuous Infusions:   LOS: 5 days   Time spent= 35 mins    Deva Ron Arsenio Loader, MD [facility]  If 7PM-7AM, please contact night-coverage  12/15/2021, 8:27 AM

## 2021-12-16 DIAGNOSIS — J9601 Acute respiratory failure with hypoxia: Secondary | ICD-10-CM | POA: Diagnosis not present

## 2021-12-16 LAB — BASIC METABOLIC PANEL
Anion gap: 12 (ref 5–15)
BUN: 40 mg/dL — ABNORMAL HIGH (ref 8–23)
CO2: 27 mmol/L (ref 22–32)
Calcium: 8.6 mg/dL — ABNORMAL LOW (ref 8.9–10.3)
Chloride: 96 mmol/L — ABNORMAL LOW (ref 98–111)
Creatinine, Ser: 1.35 mg/dL — ABNORMAL HIGH (ref 0.44–1.00)
GFR, Estimated: 42 mL/min — ABNORMAL LOW (ref >60)
Glucose, Bld: 90 mg/dL (ref 70–99)
Potassium: 4.1 mmol/L (ref 3.5–5.1)
Sodium: 135 mmol/L (ref 135–145)

## 2021-12-16 LAB — CBC
HCT: 38.9 % (ref 36.0–46.0)
Hemoglobin: 11.8 g/dL — ABNORMAL LOW (ref 12.0–15.0)
MCH: 31.7 pg (ref 26.0–34.0)
MCHC: 30.3 g/dL (ref 30.0–36.0)
MCV: 104.6 fL — ABNORMAL HIGH (ref 80.0–100.0)
Platelets: 398 10*3/uL (ref 150–400)
RBC: 3.72 MIL/uL — ABNORMAL LOW (ref 3.87–5.11)
RDW: 14.5 % (ref 11.5–15.5)
WBC: 16 10*3/uL — ABNORMAL HIGH (ref 4.0–10.5)
nRBC: 0 % (ref 0.0–0.2)

## 2021-12-16 LAB — CREATININE, SERUM
Creatinine, Ser: 1.12 mg/dL — ABNORMAL HIGH (ref 0.44–1.00)
GFR, Estimated: 53 mL/min — ABNORMAL LOW (ref >60)

## 2021-12-16 LAB — MAGNESIUM: Magnesium: 2.7 mg/dL — ABNORMAL HIGH (ref 1.7–2.4)

## 2021-12-16 MED ORDER — TIZANIDINE HCL 4 MG PO TABS: 4.0000 mg | ORAL_TABLET | Freq: Every day | ORAL | 0 refills | Status: DC

## 2021-12-16 MED ORDER — SODIUM CHLORIDE 0.9 % IV BOLUS
500.0000 mL | Freq: Once | INTRAVENOUS | Status: AC
  Administered 2021-12-16: 500 mL via INTRAVENOUS

## 2021-12-16 MED ORDER — OXYCODONE HCL 5 MG PO TABS: 5.0000 mg | ORAL_TABLET | ORAL | 0 refills | Status: DC | PRN

## 2021-12-16 MED ORDER — APIXABAN 5 MG PO TABS: ORAL_TABLET | ORAL | 0 refills | Status: DC

## 2021-12-16 NOTE — TOC Progression Note (Addendum)
Transition of Care [facility]) - Progression Note    Patient Details  Name: ZOOEY SCHREURS MRN: [id number] Date of Birth: [birth year]  Transition of Care [facility]) CM/SW Contact  Genora Arp, Juliann Pulse, RN Phone Number: 12/16/2021, 9:53 AM  Clinical Narrative:Awaiting Arlyce Harman VB#[id number] for [place].   -1:15p-auth still pending.  -3p auth still pending asked rep for case to be next to be reviewed -3:40p-still pending.MD updated.    Expected Discharge Plan: Skilled Nursing Facility Barriers to Discharge: Insurance Authorization  Expected Discharge Plan and Services Expected Discharge Plan: [place]   Discharge Planning Services: CM Consult                                           Social Determinants of Health (SDOH) Interventions    Readmission Risk Interventions    12/13/2021    3:40 PM  Readmission Risk Prevention Plan  Transportation Screening Complete  Medication Review (RN Care Manager) Complete  PCP or Specialist appointment within 3-5 days of discharge Complete  HRI or Home Care Consult Complete  SW Recovery Care/Counseling Consult Complete  Palliative Care Screening Complete  Skilled Nursing Facility Complete

## 2021-12-16 NOTE — Discharge Summary (Addendum)
Physician Discharge Summary  DONNAE MICHELS IDP:[id number] DOB: 1950-09-21 DOA: 12/09/2021  PCP: Aura Dials, PA-C  Admit date: 12/09/2021 Discharge date: 12/17/2021  Admitted From: [facility] Disposition:  [place]  Recommendations for Outpatient Follow-up:  Follow up with PCP in 1-2 weeks Repeat BMP on 12/20/2021. Start Eliquis 10 mg twice daily for 2 more days thereafter 5 mg twice daily for pulmonary embolism Pain medication with bowel regimen as needed.  Needs to have at least 1-2 soft bowel movements daily  Discharge Condition: Stable CODE STATUS: Full code Diet recommendation: Heart healthy  Brief/Interim Summary: 71 y.o. female with medical history significant for chronic diastolic heart failure, hyperlipidemia, GERD, depression, chronic anemia associated with baseline hemoglobin 9-11, who is admitted to [facility] on 12/09/2021 with acute hypoxic respiratory failure after presenting to [facility] ED complaining of shortness of breath.  VQ scan showed peripheral perfusion defect in right upper lobe.  He was diagnosed with pulmonary embolism and diastolic CHF.  Currently patient is on Eliquis and getting diuretics.     Assessment & Plan:  Principal Problem:   Acute respiratory failure with hypoxia (HCC) Active Problems:   Prolonged QT interval   Severe sepsis (HCC)   Multifocal pneumonia   GERD (gastroesophageal reflux disease)   Depression   Chronic diastolic CHF (congestive heart failure) (HCC)   Chronic anemia       Assessment and Plan:   Acute Hypoxemic Respiratory Failure, Resolved.  - Patient has been weaned off oxygen after treatment of PE and CHF.   Pulmonary Embolus w/o Cor Pulmonale -VQ scan head showed wedge-shaped peripheral perfusion defect in right upper lobe.  Lower extremity Dopplers negative for DVT.  2D echo overall looks normal.  Initially on heparin drip now on Eliquis twice daily.  She will be on 10 mg twice daily for 2 more days after  5 mg twice daily.  Mild acute kidney injury - Baseline creatinine 0.8.  It trended upwards to 1.3 with diuretics.  Improving after fluids.  Repeat blood work on 12/20/2021. Cr on dc 0.8   Acute on Chronic diastolic CHF - Patient is now euvolemic, resume home medication Coreg.  In the future patient can be started on Coreg as needed or scheduled if starts developing volume overload.   Depression -Stable   GERD -PPI   PNA - Completed course of vancomycin and cefepime.   Hypokalemia -Repleted   Hypomagnesemia -Repleted.  Monitor as needed.   PT= SNF     Discharge Diagnoses:  Principal Problem:   Acute respiratory failure with hypoxia (HCC) Active Problems:   Prolonged QT interval   Severe sepsis (HCC)   Multifocal pneumonia   GERD (gastroesophageal reflux disease)   Depression   Chronic diastolic CHF (congestive heart failure) (HCC)   Chronic anemia      Consultations: None  Subjective: Doing great this morning, does not any complaints.  Discharge Exam: Vitals:   12/16/21 2108 12/17/21 0500  BP: 136/83 (!) 154/101  Pulse: 84 69  Resp: 18 18  Temp: 98.4 F (36.9 C) 98.7 F (37.1 C)  SpO2: 98% 99%   Vitals:   12/16/21 0456 12/16/21 1335 12/16/21 2108 12/17/21 0500  BP: (!) 144/83 108/90 136/83 (!) 154/101  Pulse: 71 81 84 69  Resp:   18 18  Temp: 97.6 F (36.4 C) 98.7 F (37.1 C) 98.4 F (36.9 C) 98.7 F (37.1 C)  TempSrc: Oral Oral Oral Oral  SpO2: 94% 94% 98% 99%  Weight:  49.8 kg   50.2 kg  Height:        General: Pt is alert, awake, not in acute distress Cardiovascular: RRR, S1/S2 +, no rubs, no gallops Respiratory: CTA bilaterally, no wheezing, no rhonchi Abdominal: Soft, NT, ND, bowel sounds + Extremities: no edema, no cyanosis  Discharge Instructions   Allergies as of 12/17/2021       Reactions   Contrast Media [iodinated Contrast Media] Anaphylaxis, Hives   Ciprofloxacin Other (See Comments)   Interaction w other medicine    Erythromycin Base Nausea And Vomiting   Sulfabenzamide Other (See Comments)   UNK reaction        Medication List     TAKE these medications    apixaban 5 MG Tabs tablet Commonly known as: ELIQUIS Take 2 tablets (10 mg total) by mouth 2 (two) times daily for 2 days, THEN 1 tablet (5 mg total) 2 (two) times daily for 28 days. Start taking on: December 18, 2021   aspirin EC 81 MG tablet Take 1 tablet (81 mg total) by mouth every other day. Swallow whole.  Hold aspirin while taking eliquis, What changed:  when to take this additional instructions   Blink Tears 0.25 % Soln Generic drug: Polyethylene Glycol 400 Place 1 drop into both eyes daily as needed (dry eyes).   carvedilol 3.125 MG tablet Commonly known as: COREG Take 1 tablet (3.125 mg total) by mouth 2 (two) times daily. Hold if sbp less than 100 or heart rate less than 50   Cholecalciferol 25 MCG (1000 UT) tablet Take 1,000 Units by mouth daily.   ezetimibe-simvastatin 10-80 MG tablet Commonly known as: VYTORIN Take 1 tablet by mouth at bedtime.   ferrous sulfate 325 (65 FE) MG EC tablet Take 1 tablet (325 mg total) by mouth every Monday, Wednesday, and Friday.   folic acid 1 MG tablet Commonly known as: FOLVITE Take 1 tablet (1 mg total) by mouth daily.   hydrochlorothiazide 25 MG tablet Commonly known as: HYDRODIURIL Take 25 mg by mouth daily as needed (swelling/fluid).   lidocaine 2 % solution Commonly known as: XYLOCAINE Use as directed 15 mLs in the mouth or throat every 6 (six) hours as needed for mouth pain.   lidocaine 5 % ointment Commonly known as: XYLOCAINE Apply 1 application topically as needed.   Nurtec 75 MG Tbdp Generic drug: Rimegepant Sulfate Take 75 mg by mouth daily as needed for headache.   oxyCODONE 5 MG immediate release tablet Commonly known as: Oxy IR/ROXICODONE Take 1 tablet (5 mg total) by mouth every 4 (four) hours as needed for severe pain.   pantoprazole 40 MG  tablet Commonly known as: PROTONIX Take 1 tablet (40 mg total) by mouth 2 (two) times daily before a meal.   polyethylene glycol 17 g packet Commonly known as: MIRALAX / GLYCOLAX Take 17 g by mouth daily. What changed:  when to take this reasons to take this   senna-docusate 8.6-50 MG tablet Commonly known as: Senokot-S Take 1 tablet by mouth at bedtime.   sertraline 100 MG tablet Commonly known as: ZOLOFT Take 100 mg by mouth daily.   thiamine 100 MG tablet Take 1 tablet (100 mg total) by mouth daily.   tiZANidine 4 MG tablet Commonly known as: ZANAFLEX Take 1 tablet (4 mg total) by mouth at bedtime.   valsartan 320 MG tablet Commonly known as: DIOVAN Take 320 mg by mouth daily.   vitamin B-12 1000 MCG tablet Commonly known as: CYANOCOBALAMIN Take  1,000 mcg by mouth daily.        Follow-up Information     Aura Dials, PA-C Follow up in 1 week(s).   Specialty: Physician Assistant Contact information: [place] [address] [phone]         Jerline Pain, MD .   Specialty: Cardiology Contact information: [phone] [place]  [address] [phone]                Allergies  Allergen Reactions   Contrast Media [Iodinated Contrast Media] Anaphylaxis and Hives   Ciprofloxacin Other (See Comments)    Interaction w other medicine   Erythromycin Base Nausea And Vomiting   Sulfabenzamide Other (See Comments)    UNK reaction    You were cared for by a hospitalist during your hospital stay. If you have any questions about your discharge medications or the care you received while you were in the hospital after you are discharged, you can call the unit and asked to speak with the hospitalist on call if the hospitalist that took care of you is not available. Once you are discharged, your primary care physician will handle any further medical issues. Please note that no refills for any discharge medications  will be authorized once you are discharged, as it is imperative that you return to your primary care physician (or establish a relationship with a primary care physician if you do not have one) for your aftercare needs so that they can reassess your need for medications and monitor your lab values.   Procedures/Studies: VAS [place] LOWER EXTREMITY VENOUS (DVT)  Result Date: 12/10/2021  Lower Venous DVT Study Patient Name:  CLEONE HULICK  Date of Exam:   12/10/2021 Medical Rec #: [id number]            Accession #:    [id number] Date of Birth: 07-05-1951            Patient Gender: F Patient Age:   63 years Exam Location:  [facility] Procedure:      VAS [place] LOWER EXTREMITY VENOUS (DVT) Referring Phys: STEPHEN CHIU --------------------------------------------------------------------------------  Indications: Pulmonary embolism.  Risk Factors: None identified. Limitations: Poor ultrasound/tissue interface. Comparison Study: 12/01/2021 - Negative for left DVT. Performing Technologist: Oliver Hum RVT  Examination Guidelines: A complete evaluation includes B-mode imaging, spectral Doppler, color Doppler, and power Doppler as needed of all accessible portions of each vessel. Bilateral testing is considered an integral part of a complete examination. Limited examinations for reoccurring indications may be performed as noted. The reflux portion of the exam is performed with the patient in reverse Trendelenburg.  +---------+---------------+---------+-----------+----------+--------------+ RIGHT    CompressibilityPhasicitySpontaneityPropertiesThrombus Aging +---------+---------------+---------+-----------+----------+--------------+ CFV      Full           Yes      Yes                                 +---------+---------------+---------+-----------+----------+--------------+ SFJ      Full                                                         +---------+---------------+---------+-----------+----------+--------------+ FV Prox  Full                                                        +---------+---------------+---------+-----------+----------+--------------+  FV Mid   Full                                                        +---------+---------------+---------+-----------+----------+--------------+ FV DistalFull                                                        +---------+---------------+---------+-----------+----------+--------------+ PFV      Full                                                        +---------+---------------+---------+-----------+----------+--------------+ POP      Full           Yes      Yes                                 +---------+---------------+---------+-----------+----------+--------------+ PTV      Full                                                        +---------+---------------+---------+-----------+----------+--------------+ PERO     Full                                                        +---------+---------------+---------+-----------+----------+--------------+   +---------+---------------+---------+-----------+----------+--------------+ LEFT     CompressibilityPhasicitySpontaneityPropertiesThrombus Aging +---------+---------------+---------+-----------+----------+--------------+ CFV      Full           Yes      Yes                                 +---------+---------------+---------+-----------+----------+--------------+ SFJ      Full                                                        +---------+---------------+---------+-----------+----------+--------------+ FV Prox  Full                                                        +---------+---------------+---------+-----------+----------+--------------+ FV Mid   Full                                                         +---------+---------------+---------+-----------+----------+--------------+  FV DistalFull                                                        +---------+---------------+---------+-----------+----------+--------------+ PFV      Full                                                        +---------+---------------+---------+-----------+----------+--------------+ POP      Full           Yes      Yes                                 +---------+---------------+---------+-----------+----------+--------------+ PTV      Full                                                        +---------+---------------+---------+-----------+----------+--------------+ PERO     Full                                                        +---------+---------------+---------+-----------+----------+--------------+     Summary: RIGHT: - There is no evidence of deep vein thrombosis in the lower extremity. However, portions of this examination were limited- see technologist comments above.  - No cystic structure found in the popliteal fossa.  LEFT: - There is no evidence of deep vein thrombosis in the lower extremity. However, portions of this examination were limited- see technologist comments above.  - No cystic structure found in the popliteal fossa.  *See table(s) above for measurements and observations. Electronically signed by Deitra Mayo MD on 12/10/2021 at 5:49:46 PM.    Final    ECHOCARDIOGRAM COMPLETE  Result Date: 12/10/2021    ECHOCARDIOGRAM REPORT   Patient Name:   ZENDAYA GROSECLOSE Date of Exam: 12/10/2021 Medical Rec #:  [id number]           Height:       56.0 in Accession #:    [id number]          Weight:       126.3 lb Date of Birth:  10-21-1950           BSA:          1.460 m Patient Age:    48 years            BP:           175/94 mmHg Patient Gender: F                   HR:           91 bpm. Exam Location:  Inpatient Procedure: 2D Echo, Cardiac Doppler and Color Doppler  Indications:    elevated troponin  History:        Patient has prior  history of Echocardiogram examinations, most                 recent 09/24/2020. Previous Myocardial Infarction; Risk                 Factors:Dyslipidemia.  Sonographer:    Luisa Hart RDCS Referring Phys: [id number] Rhetta Mura  Sonographer Comments: Technically challenging study due to limited acoustic windows. Image acquisition challenging due to patient body habitus. IMPRESSIONS  1. Left ventricular ejection fraction, by estimation, is 55 to 60%. The left ventricle has normal function. The left ventricle has no regional wall motion abnormalities. There is mild left ventricular hypertrophy. Left ventricular diastolic parameters are consistent with Grade I diastolic dysfunction (impaired relaxation).  2. Right ventricular systolic function is normal. The right ventricular size is normal. There is moderately elevated pulmonary artery systolic pressure.  3. The mitral valve is grossly normal. No evidence of mitral valve regurgitation.  4. Tricuspid valve regurgitation is mild to moderate.  5. Aortic valve regurgitation is not visualized. Aortic valve sclerosis/calcification is present, without any evidence of aortic stenosis.  6. Aortic no significant ascending aortic aneurysm.  7. The inferior vena cava is normal in size with greater than 50% respiratory variability, suggesting right atrial pressure of 3 mmHg. Comparison(s): No prior Echocardiogram. FINDINGS  Left Ventricle: Left ventricular ejection fraction, by estimation, is 55 to 60%. The left ventricle has normal function. The left ventricle has no regional wall motion abnormalities. The left ventricular internal cavity size was normal in size. There is  mild left ventricular hypertrophy. Left ventricular diastolic parameters are consistent with Grade I diastolic dysfunction (impaired relaxation). Right Ventricle: The right ventricular size is normal. Right ventricular systolic function is  normal. There is moderately elevated pulmonary artery systolic pressure. The tricuspid regurgitant velocity is 3.26 m/s, and with an assumed right atrial pressure of 3 mmHg, the estimated right ventricular systolic pressure is 41.3 mmHg. Left Atrium: Left atrial size was normal in size. Right Atrium: Right atrial size was normal in size. Pericardium: There is no evidence of pericardial effusion. Mitral Valve: The mitral valve is grossly normal. No evidence of mitral valve regurgitation. Tricuspid Valve: The tricuspid valve is normal in structure. Tricuspid valve regurgitation is mild to moderate. Aortic Valve: Aortic valve regurgitation is not visualized. Aortic valve sclerosis/calcification is present, without any evidence of aortic stenosis. Aortic valve mean gradient measures 5.7 mmHg. Aortic valve peak gradient measures 10.7 mmHg. Aortic valve area, by VTI measures 1.59 cm. Pulmonic Valve: The pulmonic valve was not well visualized. Pulmonic valve regurgitation is trivial. Aorta: No significant ascending aortic aneurysm. Venous: The inferior vena cava is normal in size with greater than 50% respiratory variability, suggesting right atrial pressure of 3 mmHg. IAS/Shunts: No atrial level shunt detected by color flow Doppler.  LEFT VENTRICLE PLAX 2D LVIDd:         3.20 cm     Diastology LVIDs:         2.20 cm     LV e' medial:    5.44 cm/s LV PW:         1.10 cm     LV E/e' medial:  12.6 LV IVS:        1.00 cm     LV e' lateral:   8.59 cm/s LVOT diam:     1.80 cm     LV E/e' lateral: 8.0 LV SV:         41 LV SV Index:   28  LVOT Area:     2.54 cm  LV Volumes (MOD) LV vol d, MOD A4C: 41.7 ml LV vol s, MOD A4C: 12.1 ml LV SV MOD A4C:     41.7 ml RIGHT VENTRICLE RV Basal diam:  3.80 cm RV Mid diam:    2.60 cm RV S prime:     14.70 cm/s LEFT ATRIUM             Index        RIGHT ATRIUM           Index LA diam:        2.60 cm 1.78 cm/m   RA Area:     13.40 cm LA Vol (A2C):   42.2 ml 28.90 ml/m  RA Volume:   33.20 ml   22.74 ml/m LA Vol (A4C):   42.1 ml 28.83 ml/m LA Biplane Vol: 44.1 ml 30.20 ml/m  AORTIC VALVE                     PULMONIC VALVE AV Area (Vmax):    1.62 cm      PV Vmax:          0.98 m/s AV Area (Vmean):   1.58 cm      PV Vmean:         63.650 cm/s AV Area (VTI):     1.59 cm      PV VTI:           0.156 m AV Vmax:           163.67 cm/s   PV Peak grad:     3.9 mmHg AV Vmean:          107.667 cm/s  PV Mean grad:     2.0 mmHg AV VTI:            0.258 m       PR End Diast Vel: 5.05 msec AV Peak Grad:      10.7 mmHg AV Mean Grad:      5.7 mmHg LVOT Vmax:         104.00 cm/s LVOT Vmean:        66.700 cm/s LVOT VTI:          0.161 m LVOT/AV VTI ratio: 0.62  AORTA Ao Root diam: 2.60 cm Ao Asc diam:  3.20 cm MITRAL VALVE               TRICUSPID VALVE MV Area (PHT): 3.68 cm    TR Peak grad:   42.5 mmHg MV Decel Time: 206 msec    TR Vmax:        326.00 cm/s MV E velocity: 68.35 cm/s MV A velocity: 90.25 cm/s  SHUNTS MV E/A ratio:  0.76        Systemic VTI:  0.16 m                            Systemic Diam: 1.80 cm Phineas Inches Electronically signed by Phineas Inches Signature Date/Time: 12/10/2021/12:22:44 PM    Final    NM Pulmonary Perfusion  Result Date: 12/10/2021 CLINICAL DATA:  Shortness of breath concern for pulmonary embolus. EXAM: NUCLEAR MEDICINE PERFUSION LUNG SCAN TECHNIQUE: Perfusion images were obtained in multiple projections after intravenous injection of radiopharmaceutical. Ventilation scans intentionally deferred if perfusion scan and chest x-ray adequate for interpretation during COVID 19 epidemic. RADIOPHARMACEUTICALS:  4.2  mCi Tc-25mMAA IV COMPARISON:  chest  radiograph December 09, 2021 FINDINGS: Multifocal bilateral pulmonary perfusion defects the majority of which correspond with opacity seen on prior day chest radiograph including in the largest wedge-shaped peripheral perfusion defect in the right upper lobe. IMPRESSION: Scintigraphic findings consistent with intermediate probability of pulmonary  embolus. Electronically Signed   By: Dahlia Bailiff M.D.   On: 12/10/2021 11:01   DG Chest Portable 1 View  Result Date: 12/09/2021 CLINICAL DATA:  Shortness of breath EXAM: PORTABLE CHEST 1 VIEW COMPARISON:  12/01/2021 FINDINGS: Diffuse bilateral interstitial and airspace disease with cardiomegaly. Findings likely reflect edema/CHF. Suspect small bilateral effusions. No acute bony abnormality. IMPRESSION: Cardiomegaly with diffuse interstitial and alveolar opacities, likely CHF. Small effusions. Electronically Signed   By: Rolm Baptise M.D.   On: 12/09/2021 22:09   VAS [place] LOWER EXTREMITY VENOUS (DVT)  Result Date: 12/01/2021  Lower Venous DVT Study Patient Name:  LORAINA STAUFFER  Date of Exam:   12/01/2021 Medical Rec #: [id number]            Accession #:    [id number] Date of Birth: [birth year]            Patient Gender: F Patient Age:   60 years Exam Location:  [facility] Procedure:      VAS [place] LOWER EXTREMITY VENOUS (DVT) Referring Phys: Annamaria Boots XU --------------------------------------------------------------------------------  Indications: Edema.  Risk Factors: Trauma. Limitations: Poor ultrasound/tissue interface and patient pain tolerance. Comparison Study: No prior studies. Performing Technologist: Oliver Hum RVT  Examination Guidelines: A complete evaluation includes B-mode imaging, spectral Doppler, color Doppler, and power Doppler as needed of all accessible portions of each vessel. Bilateral testing is considered an integral part of a complete examination. Limited examinations for reoccurring indications may be performed as noted. The reflux portion of the exam is performed with the patient in reverse Trendelenburg.  +-----+---------------+---------+-----------+----------+--------------+ RIGHTCompressibilityPhasicitySpontaneityPropertiesThrombus Aging +-----+---------------+---------+-----------+----------+--------------+ CFV  Full           Yes      Yes                                  +-----+---------------+---------+-----------+----------+--------------+   +---------+---------------+---------+-----------+----------+-------------------+ LEFT     CompressibilityPhasicitySpontaneityPropertiesThrombus Aging      +---------+---------------+---------+-----------+----------+-------------------+ CFV      Full           Yes      Yes                                      +---------+---------------+---------+-----------+----------+-------------------+ SFJ      Full                                                             +---------+---------------+---------+-----------+----------+-------------------+ FV Prox  Full                                                             +---------+---------------+---------+-----------+----------+-------------------+ FV Mid   Full                                                             +---------+---------------+---------+-----------+----------+-------------------+  FV DistalFull                                                             +---------+---------------+---------+-----------+----------+-------------------+ PFV      Full                                                             +---------+---------------+---------+-----------+----------+-------------------+ POP      Full           Yes      Yes                                      +---------+---------------+---------+-----------+----------+-------------------+ PTV      Full                                                             +---------+---------------+---------+-----------+----------+-------------------+ PERO                                                  Patency shown with                                                        color doppler       +---------+---------------+---------+-----------+----------+-------------------+     Summary: RIGHT: - No evidence of common femoral vein obstruction.  LEFT: -  There is no evidence of deep vein thrombosis in the lower extremity. However, portions of this examination were limited- see technologist comments above.  - No cystic structure found in the popliteal fossa.  *See table(s) above for measurements and observations. Electronically signed by Harold Barban MD on 12/01/2021 at 8:53:38 PM.    Final    DG CHEST PORT 1 VIEW  Result Date: 12/01/2021 CLINICAL DATA:  Provided history: Hypoxia. EXAM: PORTABLE CHEST 1 VIEW COMPARISON:  Chest radiograph 09/05/2021. FINDINGS: Mild cardiomegaly. Aortic atherosclerosis. Small right pleural effusion. Additional ill-defined opacity at the right lung base which may reflect atelectasis and/or consolidation. Minimal left basilar atelectasis. No evidence of pneumothorax. No acute bony abnormality identified. IMPRESSION: Cardiomegaly. Small right pleural effusion. Additional ill-defined opacity at the right lung base, which may reflect atelectasis and/or consolidation. Minimal left basilar atelectasis. Aortic Atherosclerosis (ICD10-I70.0). Electronically Signed   By: Kellie Simmering D.O.   On: 12/01/2021 11:26   CT Head Wo Contrast  Result Date: 11/28/2021 CLINICAL DATA:  Head trauma, minor (Age >= 65y); Neck trauma (Age >= 65y) EXAM: CT HEAD WITHOUT CONTRAST CT CERVICAL SPINE WITHOUT CONTRAST TECHNIQUE: Multidetector CT imaging of the head and cervical  spine was performed following the standard protocol without intravenous contrast. Multiplanar CT image reconstructions of the cervical spine were also generated. RADIATION DOSE REDUCTION: This exam was performed according to the departmental dose-optimization program which includes automated exposure control, adjustment of the mA and/or kV according to patient size and/or use of iterative reconstruction technique. COMPARISON:  MRI 01/18/2021 FINDINGS: CT HEAD FINDINGS Brain: No evidence of acute infarction, hemorrhage, hydrocephalus, extra-axial collection or mass lesion/mass effect.  Patchy low-density changes within the periventricular and subcortical white matter compatible with chronic microvascular ischemic change. Mild-to-moderate diffuse cerebral volume loss. Vascular: No hyperdense vessel or unexpected calcification. Skull: Normal. Negative for fracture or focal lesion. Sinuses/Orbits: No acute finding. Other: None. CT CERVICAL SPINE FINDINGS Alignment: Facet joints are aligned without dislocation or traumatic listhesis. Dens and lateral masses are aligned. Mild grade 1 anterolisthesis of C3 on C4 and C4 on C5 mediated by degenerative facet arthropathy. Skull base and vertebrae: Mild superior endplate compression deformity of T2 with mild sclerosis at the superior endplate suggesting a subacute or chronic process. No evidence of an acute fracture of the cervical spine. No suspicious lytic or sclerotic bone lesion. Soft tissues and spinal canal: No prevertebral fluid or swelling. No visible canal hematoma. Disc levels: Intervertebral disc heights are preserved. Degenerative facet arthropathy within the upper cervical spine. Upper chest: Negative. Other: None. IMPRESSION: 1. No acute intracranial abnormality. 2. No acute cervical spine fracture. 3. Mild superior endplate compression deformity of T2 with mild sclerosis at the superior endplate suggesting a subacute or chronic process. Correlate with point tenderness. 4. Chronic microvascular ischemic change and cerebral volume loss. Electronically Signed   By: Davina Poke D.O.   On: 11/28/2021 15:28   CT Cervical Spine Wo Contrast  Result Date: 11/28/2021 CLINICAL DATA:  Head trauma, minor (Age >= 65y); Neck trauma (Age >= 65y) EXAM: CT HEAD WITHOUT CONTRAST CT CERVICAL SPINE WITHOUT CONTRAST TECHNIQUE: Multidetector CT imaging of the head and cervical spine was performed following the standard protocol without intravenous contrast. Multiplanar CT image reconstructions of the cervical spine were also generated. RADIATION DOSE  REDUCTION: This exam was performed according to the departmental dose-optimization program which includes automated exposure control, adjustment of the mA and/or kV according to patient size and/or use of iterative reconstruction technique. COMPARISON:  MRI 01/18/2021 FINDINGS: CT HEAD FINDINGS Brain: No evidence of acute infarction, hemorrhage, hydrocephalus, extra-axial collection or mass lesion/mass effect. Patchy low-density changes within the periventricular and subcortical white matter compatible with chronic microvascular ischemic change. Mild-to-moderate diffuse cerebral volume loss. Vascular: No hyperdense vessel or unexpected calcification. Skull: Normal. Negative for fracture or focal lesion. Sinuses/Orbits: No acute finding. Other: None. CT CERVICAL SPINE FINDINGS Alignment: Facet joints are aligned without dislocation or traumatic listhesis. Dens and lateral masses are aligned. Mild grade 1 anterolisthesis of C3 on C4 and C4 on C5 mediated by degenerative facet arthropathy. Skull base and vertebrae: Mild superior endplate compression deformity of T2 with mild sclerosis at the superior endplate suggesting a subacute or chronic process. No evidence of an acute fracture of the cervical spine. No suspicious lytic or sclerotic bone lesion. Soft tissues and spinal canal: No prevertebral fluid or swelling. No visible canal hematoma. Disc levels: Intervertebral disc heights are preserved. Degenerative facet arthropathy within the upper cervical spine. Upper chest: Negative. Other: None. IMPRESSION: 1. No acute intracranial abnormality. 2. No acute cervical spine fracture. 3. Mild superior endplate compression deformity of T2 with mild sclerosis at the superior endplate suggesting a subacute or chronic  process. Correlate with point tenderness. 4. Chronic microvascular ischemic change and cerebral volume loss. Electronically Signed   By: Davina Poke D.O.   On: 11/28/2021 15:28   DG Knee Complete 4 Views  Left  Result Date: 11/28/2021 CLINICAL DATA:  Fall. EXAM: LEFT KNEE - COMPLETE 4+ VIEW COMPARISON:  None Available. FINDINGS: The bones are diffusely osteopenic. Small joint effusion present. Vascular calcifications are seen in the soft tissues. There is no acute fracture or dislocation identified. There is patellofemoral compartment joint space narrowing. Alignment is anatomic. IMPRESSION: 1. Small joint effusion. 2. No acute bony abnormality. 3. Mild degenerative changes. 4. Diffuse osteopenia. Electronically Signed   By: Ronney Asters M.D.   On: 11/28/2021 15:28   DG Tibia/Fibula Left  Result Date: 11/28/2021 CLINICAL DATA:  Recent fall, knee pain EXAM: LEFT TIBIA AND FIBULA - 2 VIEW COMPARISON:  11/28/2021 FINDINGS: Bones are osteopenic. No acute osseous finding, displaced fracture or malalignment. Peripheral atherosclerosis noted. IMPRESSION: Osteopenia.  No acute finding by plain radiography Peripheral atherosclerosis Electronically Signed   By: Jerilynn Mages.  Shick M.D.   On: 11/28/2021 15:27     The results of significant diagnostics from this hospitalization (including imaging, microbiology, ancillary and laboratory) are listed below for reference.     Microbiology: Recent Results (from the past 240 hour(s))  SARS Coronavirus 2 by RT PCR (hospital order, performed in [facility] hospital lab) *cepheid single result test* Anterior Nasal Swab     Status: None   Collection Time: 12/10/21 12:00 AM   Specimen: Anterior Nasal Swab  Result Value Ref Range Status   SARS Coronavirus 2 by RT PCR NEGATIVE NEGATIVE Final    Comment: (NOTE) SARS-CoV-2 target nucleic acids are NOT DETECTED.  The SARS-CoV-2 RNA is generally detectable in upper and lower respiratory specimens during the acute phase of infection. The lowest concentration of SARS-CoV-2 viral copies this assay can detect is 250 copies / mL. A negative result does not preclude SARS-CoV-2 infection and should not be used as the sole basis for  treatment or other patient management decisions.  A negative result may occur with improper specimen collection / handling, submission of specimen other than nasopharyngeal swab, presence of viral mutation(s) within the areas targeted by this assay, and inadequate number of viral copies (<250 copies / mL). A negative result must be combined with clinical observations, patient history, and epidemiological information.  Fact Sheet for Patients:   https://www.patel.info/  Fact Sheet for Healthcare Providers: https://hall.com/  This test is not yet approved or  cleared by the [place] FDA and has been authorized for detection and/or diagnosis of SARS-CoV-2 by FDA under an Emergency Use Authorization (EUA).  This EUA will remain in effect (meaning this test can be used) for the duration of the COVID-19 declaration under Section 564(b)(1) of the Act, 21 U.S.C. section 360bbb-3(b)(1), unless the authorization is terminated or revoked sooner.  Performed at [facility], [place] [address], [place], [place] [address]   MRSA Next Gen by PCR, Nasal     Status: None   Collection Time: 12/10/21  3:42 AM   Specimen: Nasal Mucosa; Nasal Swab  Result Value Ref Range Status   MRSA by PCR Next Gen NOT DETECTED NOT DETECTED Final    Comment: (NOTE) The GeneXpert MRSA Assay (FDA approved for NASAL specimens only), is one component of a comprehensive MRSA colonization surveillance program. It is not intended to diagnose MRSA infection nor to guide or monitor treatment for MRSA infections. Test performance  is not FDA approved in patients less than 64 years old. Performed at [facility], [place] [address], [place], [place] [address]      Labs: BNP (last 3 results) Recent Labs    12/09/21 2330 12/15/21 0441  BNP 900.3* 132.4*   Basic Metabolic Panel: Recent Labs  Lab 12/13/21 0437 12/14/21 0512 12/15/21 0441  12/16/21 0520 12/16/21 1035 12/17/21 0501  NA 142 137 136 135  --  141  K 3.8 2.8* 5.1 4.1  --  3.9  CL 102 95* 96* 96*  --  104  CO2 32 32 31 27  --  29  GLUCOSE 92 97 125* 90  --  89  BUN 23 25* 33* 40*  --  30*  CREATININE 0.76 0.84 1.04* 1.35* 1.12* 0.82  CALCIUM 8.5* 8.1* 8.6* 8.6*  --  8.5*  MG  --  1.6*  --  2.7*  --  2.5*   Liver Function Tests: Recent Labs  Lab 12/11/21 0928 12/12/21 0301 12/13/21 0437 12/14/21 0512 12/15/21 0441  AST 22 34 '22 20 26  '$ ALT '14 13 12 11 13  '$ ALKPHOS 170* 149* 154* 160* 178*  BILITOT 0.7 1.0 0.8 0.6 0.5  PROT 6.0* 5.5* 5.5* 5.7* 6.1*  ALBUMIN 2.3* 1.9* 2.1* 2.0* 2.3*   No results for input(s): "LIPASE", "AMYLASE" in the last 168 hours.  No results for input(s): "AMMONIA" in the last 168 hours. CBC: Recent Labs  Lab 12/13/21 0437 12/14/21 0512 12/15/21 0441 12/16/21 0520 12/17/21 0501  WBC 14.5* 16.0* 15.6* 16.0* 12.5*  HGB 10.0* 10.6* 11.0* 11.8* 10.7*  HCT 31.9* 33.8* 35.3* 38.9 35.5*  MCV 101.6* 100.0 100.3* 104.6* 103.2*  PLT 370 380 430* 398 349   Cardiac Enzymes: No results for input(s): "CKTOTAL", "CKMB", "CKMBINDEX", "TROPONINI" in the last 168 hours. BNP: Invalid input(s): "POCBNP" CBG: Recent Labs  Lab 12/10/21 0756  GLUCAP 104*   D-Dimer No results for input(s): "DDIMER" in the last 72 hours. Hgb A1c No results for input(s): "HGBA1C" in the last 72 hours. Lipid Profile No results for input(s): "CHOL", "HDL", "LDLCALC", "TRIG", "CHOLHDL", "LDLDIRECT" in the last 72 hours. Thyroid function studies No results for input(s): "TSH", "T4TOTAL", "T3FREE", "THYROIDAB" in the last 72 hours.  Invalid input(s): "FREET3" Anemia work up No results for input(s): "VITAMINB12", "FOLATE", "FERRITIN", "TIBC", "IRON", "RETICCTPCT" in the last 72 hours. Urinalysis    Component Value Date/Time   COLORURINE YELLOW 12/10/2021 0410   APPEARANCEUR HAZY (A) 12/10/2021 0410   LABSPEC 1.028 12/10/2021 0410   PHURINE 5.0  12/10/2021 0410   GLUCOSEU NEGATIVE 12/10/2021 0410   HGBUR NEGATIVE 12/10/2021 0410   BILIRUBINUR NEGATIVE 12/10/2021 0410   KETONESUR NEGATIVE 12/10/2021 0410   PROTEINUR 30 (A) 12/10/2021 0410   NITRITE NEGATIVE 12/10/2021 0410   LEUKOCYTESUR TRACE (A) 12/10/2021 0410   Sepsis Labs Recent Labs  Lab 12/14/21 0512 12/15/21 0441 12/16/21 0520 12/17/21 0501  WBC 16.0* 15.6* 16.0* 12.5*   Microbiology Recent Results (from the past 240 hour(s))  SARS Coronavirus 2 by RT PCR (hospital order, performed in [facility] hospital lab) *cepheid single result test* Anterior Nasal Swab     Status: None   Collection Time: 12/10/21 12:00 AM   Specimen: Anterior Nasal Swab  Result Value Ref Range Status   SARS Coronavirus 2 by RT PCR NEGATIVE NEGATIVE Final    Comment: (NOTE) SARS-CoV-2 target nucleic acids are NOT DETECTED.  The SARS-CoV-2 RNA is generally detectable in upper and lower respiratory specimens during  the acute phase of infection. The lowest concentration of SARS-CoV-2 viral copies this assay can detect is 250 copies / mL. A negative result does not preclude SARS-CoV-2 infection and should not be used as the sole basis for treatment or other patient management decisions.  A negative result may occur with improper specimen collection / handling, submission of specimen other than nasopharyngeal swab, presence of viral mutation(s) within the areas targeted by this assay, and inadequate number of viral copies (<250 copies / mL). A negative result must be combined with clinical observations, patient history, and epidemiological information.  Fact Sheet for Patients:   https://www.patel.info/  Fact Sheet for Healthcare Providers: https://hall.com/  This test is not yet approved or  cleared by the [place] FDA and has been authorized for detection and/or diagnosis of SARS-CoV-2 by FDA under an Emergency Use Authorization (EUA).   This EUA will remain in effect (meaning this test can be used) for the duration of the COVID-19 declaration under Section 564(b)(1) of the Act, 21 U.S.C. section 360bbb-3(b)(1), unless the authorization is terminated or revoked sooner.  Performed at [facility], [place] [address] [place], [place] [address]   MRSA Next Gen by PCR, Nasal     Status: None   Collection Time: 12/10/21  3:42 AM   Specimen: Nasal Mucosa; Nasal Swab  Result Value Ref Range Status   MRSA by PCR Next Gen NOT DETECTED NOT DETECTED Final    Comment: (NOTE) The GeneXpert MRSA Assay (FDA approved for NASAL specimens only), is one component of a comprehensive MRSA colonization surveillance program. It is not intended to diagnose MRSA infection nor to guide or monitor treatment for MRSA infections. Test performance is not FDA approved in patients less than 53 years old. Performed at [facility], [place] [address] [place], [place] [address]      Time coordinating discharge:  I have spent 35 minutes face to face with the patient and on the ward discussing the patients care, assessment, plan and disposition with other care givers. >50% of the time was devoted counseling the patient about the risks and benefits of treatment/Discharge disposition and coordinating care.   SIGNED:   Damita Lack, MD  [facility] 12/17/2021, 7:51 AM   If 7PM-7AM, please contact night-coverage

## 2021-12-17 DIAGNOSIS — R0902 Hypoxemia: Secondary | ICD-10-CM | POA: Diagnosis not present

## 2021-12-17 DIAGNOSIS — C679 Malignant neoplasm of bladder, unspecified: Secondary | ICD-10-CM | POA: Diagnosis not present

## 2021-12-17 DIAGNOSIS — J9601 Acute respiratory failure with hypoxia: Secondary | ICD-10-CM | POA: Diagnosis not present

## 2021-12-17 DIAGNOSIS — R41841 Cognitive communication deficit: Secondary | ICD-10-CM | POA: Diagnosis not present

## 2021-12-17 DIAGNOSIS — D638 Anemia in other chronic diseases classified elsewhere: Secondary | ICD-10-CM | POA: Diagnosis not present

## 2021-12-17 DIAGNOSIS — I2510 Atherosclerotic heart disease of native coronary artery without angina pectoris: Secondary | ICD-10-CM | POA: Diagnosis not present

## 2021-12-17 DIAGNOSIS — I2699 Other pulmonary embolism without acute cor pulmonale: Secondary | ICD-10-CM | POA: Diagnosis not present

## 2021-12-17 DIAGNOSIS — I10 Essential (primary) hypertension: Secondary | ICD-10-CM | POA: Diagnosis not present

## 2021-12-17 DIAGNOSIS — R2689 Other abnormalities of gait and mobility: Secondary | ICD-10-CM | POA: Diagnosis not present

## 2021-12-17 DIAGNOSIS — D649 Anemia, unspecified: Secondary | ICD-10-CM | POA: Diagnosis not present

## 2021-12-17 DIAGNOSIS — F331 Major depressive disorder, recurrent, moderate: Secondary | ICD-10-CM | POA: Diagnosis not present

## 2021-12-17 DIAGNOSIS — Z7401 Bed confinement status: Secondary | ICD-10-CM | POA: Diagnosis not present

## 2021-12-17 DIAGNOSIS — F32A Depression, unspecified: Secondary | ICD-10-CM | POA: Diagnosis not present

## 2021-12-17 DIAGNOSIS — R5383 Other fatigue: Secondary | ICD-10-CM | POA: Diagnosis not present

## 2021-12-17 DIAGNOSIS — R531 Weakness: Secondary | ICD-10-CM | POA: Diagnosis not present

## 2021-12-17 DIAGNOSIS — I5032 Chronic diastolic (congestive) heart failure: Secondary | ICD-10-CM | POA: Diagnosis not present

## 2021-12-17 DIAGNOSIS — R2681 Unsteadiness on feet: Secondary | ICD-10-CM | POA: Diagnosis not present

## 2021-12-17 DIAGNOSIS — J90 Pleural effusion, not elsewhere classified: Secondary | ICD-10-CM | POA: Diagnosis not present

## 2021-12-17 DIAGNOSIS — K219 Gastro-esophageal reflux disease without esophagitis: Secondary | ICD-10-CM | POA: Diagnosis not present

## 2021-12-17 DIAGNOSIS — M25562 Pain in left knee: Secondary | ICD-10-CM | POA: Diagnosis not present

## 2021-12-17 DIAGNOSIS — M25572 Pain in left ankle and joints of left foot: Secondary | ICD-10-CM | POA: Diagnosis not present

## 2021-12-17 DIAGNOSIS — M6281 Muscle weakness (generalized): Secondary | ICD-10-CM | POA: Diagnosis not present

## 2021-12-17 LAB — CBC
HCT: 35.5 % — ABNORMAL LOW (ref 36.0–46.0)
Hemoglobin: 10.7 g/dL — ABNORMAL LOW (ref 12.0–15.0)
MCH: 31.1 pg (ref 26.0–34.0)
MCHC: 30.1 g/dL (ref 30.0–36.0)
MCV: 103.2 fL — ABNORMAL HIGH (ref 80.0–100.0)
Platelets: 349 10*3/uL (ref 150–400)
RBC: 3.44 MIL/uL — ABNORMAL LOW (ref 3.87–5.11)
RDW: 14.4 % (ref 11.5–15.5)
WBC: 12.5 10*3/uL — ABNORMAL HIGH (ref 4.0–10.5)
nRBC: 0 % (ref 0.0–0.2)

## 2021-12-17 LAB — BASIC METABOLIC PANEL
Anion gap: 8 (ref 5–15)
BUN: 30 mg/dL — ABNORMAL HIGH (ref 8–23)
CO2: 29 mmol/L (ref 22–32)
Calcium: 8.5 mg/dL — ABNORMAL LOW (ref 8.9–10.3)
Chloride: 104 mmol/L (ref 98–111)
Creatinine, Ser: 0.82 mg/dL (ref 0.44–1.00)
GFR, Estimated: >60 mL/min (ref >60)
Glucose, Bld: 89 mg/dL (ref 70–99)
Potassium: 3.9 mmol/L (ref 3.5–5.1)
Sodium: 141 mmol/L (ref 135–145)

## 2021-12-17 LAB — MAGNESIUM: Magnesium: 2.5 mg/dL — ABNORMAL HIGH (ref 1.7–2.4)

## 2021-12-17 NOTE — NC FL2 (Signed)
[place] LEVEL OF CARE SCREENING TOOL     IDENTIFICATION  Patient Name: Heather Garrett Birthdate: Sep 20, 1950 Sex: female Admission Date (Current Location): 12/09/2021  [facility] and [place] Number:  Herbalist and Address:  [facility],  [place], [place]      Provider Number: [id number]  Attending Physician Name and Address:  Damita Lack, MD  Relative Name and Phone Number:  Dinora Hemm spouse [phone]    Current Level of Care: Hospital Recommended Level of Care: [place] Prior Approval Number:    Date Approved/Denied:   PASRR Number:  [id number])  Discharge Plan: SNF    Current Diagnoses: Patient Active Problem List   Diagnosis Date Noted   Acute respiratory failure with hypoxia ([place]) 12/10/2021   Severe sepsis ([place]) 12/10/2021   Multifocal pneumonia 12/10/2021   GERD (gastroesophageal reflux disease) 12/10/2021   Depression 12/10/2021   Chronic diastolic CHF (congestive heart failure) ([place]) 12/10/2021   Chronic anemia 12/10/2021   UTI (urinary tract infection) 11/28/2021   Prolonged QT interval 11/28/2021   Normocytic anemia 09/05/2021   Hyperkalemia 09/05/2021   Triggering of finger 05/17/2021   Heart disease 05/17/2021   Malignant tumor of breast ([place]) 05/17/2021   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[place]) 05/17/2021   Hypertensive disorder 05/17/2021   Lower extremity edema 05/14/2021   Benign paroxysmal positional vertigo of left ear 03/23/2021   Daytime somnolence 03/23/2021   History of sleep apnea 03/23/2021   Orthostatic hypotension 03/23/2021   Migraine without aura and without status migrainosus, not intractable 03/23/2021   Coronary arteriosclerosis in native artery 09/28/2016   Elevated LFTs 09/28/2016   Rotator cuff syndrome 09/28/2016   Biceps tendinitis 09/28/2016   Cobalamin deficiency 09/28/2016   Hyperlipidemia 09/28/2016   Migraine 09/28/2016    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   Vitamin D deficiency 09/28/2016    Orientation RESPIRATION BLADDER Height & Weight     Self, Time, Situation, Place  Normal Incontinent Weight: 50.2 kg Height:  '4\' 8"'$  (142.2 cm)  BEHAVIORAL SYMPTOMS/MOOD NEUROLOGICAL BOWEL NUTRITION STATUS      Incontinent Diet (Regular)  AMBULATORY STATUS COMMUNICATION OF NEEDS Skin   Limited Assist   Normal                       Personal Care Assistance Level of Assistance  Bathing, Feeding, Dressing Bathing Assistance: Limited assistance Feeding assistance: Limited assistance Dressing Assistance: Limited assistance     Functional Limitations Info  Sight, Hearing, Speech Sight Info: Impaired (eyeglasses) Hearing Info: Adequate Speech Info: Adequate    SPECIAL CARE FACTORS FREQUENCY  PT (By licensed PT), OT (By licensed OT)     PT Frequency:  (5x week) OT Frequency:  (5x week)            Contractures Contractures Info: Not present    Additional Factors Info  Code Status, Allergies, Psychotropic Code Status Info:  (Full) Allergies Info:  (Contrast Media (Iodinated Contrast Media), Ciprofloxacin, Erythromycin Base, Sulfabenzamide) Psychotropic Info:  (Sertraline see d/c summary)         Current Medications (12/17/2021):  This is the current hospital active medication list Current Facility-Administered Medications  Medication Dose Route Frequency Provider Last Rate Last Admin   acetaminophen (TYLENOL) tablet 650 mg  650 mg Oral Q6H PRN Donne Hazel, MD   650 mg at 12/16/21 1524   Or   acetaminophen (TYLENOL) suppository 650  mg  650 mg Rectal Q6H PRN Donne Hazel, MD       apixaban Arne [place]) tablet 10 mg  10 mg Oral BID Donne Hazel, MD   10 mg at 12/16/21 2154   Followed by   Derrill Memo ON 12/18/2021] apixaban (ELIQUIS) tablet 5 mg  5 mg Oral BID Donne Hazel, MD       aspirin EC tablet 81 mg  81 mg Oral Daily Donne Hazel, MD   81 mg at 12/16/21 0936    carvedilol (COREG) tablet 3.125 mg  3.125 mg Oral BID Donne Hazel, MD   3.125 mg at 12/16/21 2154   guaiFENesin (ROBITUSSIN) 100 MG/5ML liquid 5 mL  5 mL Oral Q4H PRN Amin, Ankit Chirag, MD       hydrALAZINE (APRESOLINE) injection 10 mg  10 mg Intravenous Q4H PRN Amin, Ankit Chirag, MD       ipratropium-albuterol (DUONEB) 0.5-2.5 (3) MG/3ML nebulizer solution 3 mL  3 mL Nebulization Q4H PRN Amin, Ankit Chirag, MD       MEDLINE mouth rinse  15 mL Mouth Rinse BID Donne Hazel, MD   15 mL at 12/16/21 0938   metoprolol tartrate (LOPRESSOR) injection 5 mg  5 mg Intravenous Q4H PRN Amin, Ankit Chirag, MD       morphine (PF) 2 MG/ML injection 1 mg  1 mg Intravenous Q3H PRN Donne Hazel, MD   1 mg at 12/16/21 2153   pantoprazole (PROTONIX) EC tablet 40 mg  40 mg Oral BID AC Donne Hazel, MD   40 mg at 12/16/21 1834   polyethylene glycol (MIRALAX / GLYCOLAX) packet 17 g  17 g Oral Daily PRN Donne Hazel, MD   17 g at 12/12/21 1826   prochlorperazine (COMPAZINE) injection 10 mg  10 mg Intravenous Q4H PRN Donne Hazel, MD   10 mg at 12/12/21 0844   senna-docusate (Senokot-S) tablet 1 tablet  1 tablet Oral QHS PRN Amin, Ankit Chirag, MD       traZODone (DESYREL) tablet 50 mg  50 mg Oral QHS PRN Amin, Jeanella Flattery, MD         Discharge Medications: Please see discharge summary for a list of discharge medications.  Relevant Imaging Results:  Relevant Lab Results:   Additional Information  (SS# [id number])  Donique Hammonds, Juliann Pulse, RN

## 2021-12-17 NOTE — TOC Transition Note (Addendum)
Transition of Care [facility]) - CM/SW Discharge Note   Patient Details  Name: Heather Garrett MRN: [id number] Date of Birth: 09/24/1950  Transition of Care [facility]) CM/SW Contact:  Dessa Phi, RN Phone Number: 12/17/2021, 9:13 AM   Clinical Narrative:   received [name] ID [id number] to [facility]-await rm#,tel# report,then PTAR. -10:28a going to rm#[phone],nsg report NAT#[phone].PTAR called. No further CM needs.   Final next level of care: Skilled Nursing Facility Barriers to Discharge: No Barriers Identified   Patient Goals and CMS Choice Patient states their goals for this hospitalization and ongoing recovery are:: Retrun back to SNF CMS Medicare.gov Compare Post Acute Care list provided to:: Patient Represenative (must comment) [name] spouse)    Discharge Placement   Existing PASRR number confirmed : 12/15/21          [facility] at: [facility] Patient to be transferred to facility by: [place] Name of family member notified: [name] spouse Patient and family notified of of transfer: 12/17/21  Discharge Plan and Services   Discharge Planning Services: CM Consult                                 Social Determinants of Health ([place]) Interventions     Readmission Risk Interventions    12/13/2021    3:40 PM  Readmission Risk Prevention Plan  Transportation Screening Complete  Medication Review (RN Care Manager) Complete  PCP or Specialist appointment within 3-5 days of discharge Complete  HRI or [place] Complete  SW Recovery Care/Counseling Consult Complete  Palliative Care Screening Complete  Skilled Nursing Facility Complete

## 2021-12-17 NOTE — Plan of Care (Signed)
  Problem: Fluid Volume: Goal: Hemodynamic stability will improve Outcome: Adequate for Discharge   Problem: Clinical Measurements: Goal: Diagnostic test results will improve Outcome: Adequate for Discharge Goal: Signs and symptoms of infection will decrease Outcome: Adequate for Discharge   Problem: Respiratory: Goal: Ability to maintain adequate ventilation will improve Outcome: Adequate for Discharge   Problem: Education: Goal: Knowledge of General Education information will improve Description: Including pain rating scale, medication(s)/side effects and non-pharmacologic comfort measures Outcome: Adequate for Discharge   Problem: Health Behavior/Discharge Planning: Goal: Ability to manage health-related needs will improve Outcome: Adequate for Discharge   Problem: Clinical Measurements: Goal: Ability to maintain clinical measurements within normal limits will improve Outcome: Adequate for Discharge Goal: Will remain free from infection Outcome: Adequate for Discharge Goal: Diagnostic test results will improve Outcome: Adequate for Discharge Goal: Respiratory complications will improve Outcome: Adequate for Discharge Goal: Cardiovascular complication will be avoided Outcome: Adequate for Discharge   Problem: Activity: Goal: Risk for activity intolerance will decrease Outcome: Adequate for Discharge   Problem: Nutrition: Goal: Adequate nutrition will be maintained Outcome: Adequate for Discharge   Problem: Coping: Goal: Level of anxiety will decrease Outcome: Adequate for Discharge   Problem: Elimination: Goal: Will not experience complications related to bowel motility Outcome: Adequate for Discharge Goal: Will not experience complications related to urinary retention Outcome: Adequate for Discharge   Problem: Pain Managment: Goal: General experience of comfort will improve Outcome: Adequate for Discharge   Problem: Safety: Goal: Ability to remain free  from injury will improve Outcome: Adequate for Discharge   Problem: Skin Integrity: Goal: Risk for impaired skin integrity will decrease Outcome: Adequate for Discharge   Problem: Activity: Goal: Ability to tolerate increased activity will improve Outcome: Adequate for Discharge   Problem: Clinical Measurements: Goal: Ability to maintain a body temperature in the normal range will improve Outcome: Adequate for Discharge   Problem: Respiratory: Goal: Ability to maintain adequate ventilation will improve Outcome: Adequate for Discharge Goal: Ability to maintain a clear airway will improve Outcome: Adequate for Discharge

## 2021-12-17 NOTE — Progress Notes (Signed)
Patient seen and examined at bedside, no other complaints this morning.  All signs remained stable. Discharge summary completed 6/8, updated today on 6/9.  Discussed case with patient's RN and the patient herself.  Gerlean Ren MD [facility]

## 2021-12-20 DIAGNOSIS — M25572 Pain in left ankle and joints of left foot: Secondary | ICD-10-CM | POA: Diagnosis not present

## 2021-12-20 DIAGNOSIS — M6281 Muscle weakness (generalized): Secondary | ICD-10-CM | POA: Diagnosis not present

## 2021-12-20 DIAGNOSIS — M25562 Pain in left knee: Secondary | ICD-10-CM | POA: Diagnosis not present

## 2021-12-21 DIAGNOSIS — I2510 Atherosclerotic heart disease of native coronary artery without angina pectoris: Secondary | ICD-10-CM | POA: Diagnosis not present

## 2021-12-21 DIAGNOSIS — M6281 Muscle weakness (generalized): Secondary | ICD-10-CM | POA: Diagnosis not present

## 2021-12-21 DIAGNOSIS — M25572 Pain in left ankle and joints of left foot: Secondary | ICD-10-CM | POA: Diagnosis not present

## 2021-12-21 DIAGNOSIS — J90 Pleural effusion, not elsewhere classified: Secondary | ICD-10-CM | POA: Diagnosis not present

## 2021-12-23 DIAGNOSIS — F331 Major depressive disorder, recurrent, moderate: Secondary | ICD-10-CM | POA: Diagnosis not present

## 2021-12-23 DIAGNOSIS — C679 Malignant neoplasm of bladder, unspecified: Secondary | ICD-10-CM | POA: Diagnosis not present

## 2021-12-23 DIAGNOSIS — J9601 Acute respiratory failure with hypoxia: Secondary | ICD-10-CM | POA: Diagnosis not present

## 2021-12-23 DIAGNOSIS — M6281 Muscle weakness (generalized): Secondary | ICD-10-CM | POA: Diagnosis not present

## 2021-12-23 DIAGNOSIS — I2699 Other pulmonary embolism without acute cor pulmonale: Secondary | ICD-10-CM | POA: Diagnosis not present

## 2021-12-23 DIAGNOSIS — I5032 Chronic diastolic (congestive) heart failure: Secondary | ICD-10-CM | POA: Diagnosis not present

## 2021-12-23 DIAGNOSIS — R2689 Other abnormalities of gait and mobility: Secondary | ICD-10-CM | POA: Diagnosis not present

## 2021-12-23 DIAGNOSIS — D638 Anemia in other chronic diseases classified elsewhere: Secondary | ICD-10-CM | POA: Diagnosis not present

## 2021-12-27 DIAGNOSIS — R2689 Other abnormalities of gait and mobility: Secondary | ICD-10-CM | POA: Diagnosis not present

## 2021-12-27 DIAGNOSIS — I5032 Chronic diastolic (congestive) heart failure: Secondary | ICD-10-CM | POA: Diagnosis not present

## 2021-12-27 DIAGNOSIS — F331 Major depressive disorder, recurrent, moderate: Secondary | ICD-10-CM | POA: Diagnosis not present

## 2021-12-27 DIAGNOSIS — J9601 Acute respiratory failure with hypoxia: Secondary | ICD-10-CM | POA: Diagnosis not present

## 2021-12-27 DIAGNOSIS — M6281 Muscle weakness (generalized): Secondary | ICD-10-CM | POA: Diagnosis not present

## 2021-12-27 DIAGNOSIS — D638 Anemia in other chronic diseases classified elsewhere: Secondary | ICD-10-CM | POA: Diagnosis not present

## 2021-12-27 DIAGNOSIS — C679 Malignant neoplasm of bladder, unspecified: Secondary | ICD-10-CM | POA: Diagnosis not present

## 2021-12-27 DIAGNOSIS — I2699 Other pulmonary embolism without acute cor pulmonale: Secondary | ICD-10-CM | POA: Diagnosis not present

## 2021-12-30 DIAGNOSIS — R2689 Other abnormalities of gait and mobility: Secondary | ICD-10-CM | POA: Diagnosis not present

## 2021-12-30 DIAGNOSIS — D638 Anemia in other chronic diseases classified elsewhere: Secondary | ICD-10-CM | POA: Diagnosis not present

## 2021-12-30 DIAGNOSIS — F331 Major depressive disorder, recurrent, moderate: Secondary | ICD-10-CM | POA: Diagnosis not present

## 2021-12-30 DIAGNOSIS — I2699 Other pulmonary embolism without acute cor pulmonale: Secondary | ICD-10-CM | POA: Diagnosis not present

## 2021-12-30 DIAGNOSIS — I5032 Chronic diastolic (congestive) heart failure: Secondary | ICD-10-CM | POA: Diagnosis not present

## 2021-12-30 DIAGNOSIS — M6281 Muscle weakness (generalized): Secondary | ICD-10-CM | POA: Diagnosis not present

## 2021-12-30 DIAGNOSIS — C679 Malignant neoplasm of bladder, unspecified: Secondary | ICD-10-CM | POA: Diagnosis not present

## 2021-12-30 DIAGNOSIS — J9601 Acute respiratory failure with hypoxia: Secondary | ICD-10-CM | POA: Diagnosis not present

## 2021-12-31 DIAGNOSIS — J90 Pleural effusion, not elsewhere classified: Secondary | ICD-10-CM | POA: Diagnosis not present

## 2021-12-31 DIAGNOSIS — M6281 Muscle weakness (generalized): Secondary | ICD-10-CM | POA: Diagnosis not present

## 2021-12-31 DIAGNOSIS — F32A Depression, unspecified: Secondary | ICD-10-CM | POA: Diagnosis not present

## 2021-12-31 DIAGNOSIS — K219 Gastro-esophageal reflux disease without esophagitis: Secondary | ICD-10-CM | POA: Diagnosis not present

## 2022-01-18 ENCOUNTER — Other Ambulatory Visit: Payer: Self-pay

## 2022-01-18 ENCOUNTER — Ambulatory Visit
Admission: EM | Admit: 2022-01-18 | Discharge: 2022-01-18 | Disposition: A | Discharge status: Home / Self Care | Payer: Medicare PPO | Attending: Family Medicine | Admitting: Family Medicine

## 2022-01-18 ENCOUNTER — Encounter: Payer: Self-pay | Admitting: Emergency Medicine

## 2022-01-18 DIAGNOSIS — N390 Urinary tract infection, site not specified: Secondary | ICD-10-CM | POA: Diagnosis not present

## 2022-01-18 LAB — POCT URINALYSIS DIP (MANUAL ENTRY)
Bilirubin, UA: NEGATIVE
Glucose, UA: NEGATIVE mg/dL
Ketones, POC UA: NEGATIVE mg/dL
Nitrite, UA: NEGATIVE
Protein Ur, POC: NEGATIVE mg/dL
Spec Grav, UA: 1.015 (ref 1.010–1.025)
Urobilinogen, UA: 1 E.U./dL
pH, UA: 6 (ref 5.0–8.0)

## 2022-01-18 MED ORDER — CEPHALEXIN 500 MG PO CAPS: 500.0000 mg | ORAL_CAPSULE | Freq: Two times a day (BID) | ORAL | 0 refills | Status: DC

## 2022-01-18 NOTE — ED Triage Notes (Signed)
Pt reports dysuria, intermittent abd pain, fever for last several days.

## 2022-01-19 LAB — URINE CULTURE

## 2022-01-21 NOTE — ED Provider Notes (Signed)
[facility]    CSN: [id number] Arrival date & time: 01/18/22  0160      History   Chief Complaint Chief Complaint  Patient presents with   Dysuria    HPI Heather Garrett is a 71 y.o. female.   Presenting today with several day history of dysuria, intermittent lower abdominal pressure, fever off-and-on.  Denies hematuria, vaginal discharge, bowel changes, vomiting, nausea.  So far not tried anything over-the-counter for symptoms.  History of urinary tract infections and bladder cancer.    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[place])    Vitamin D deficiency     Patient Active Problem List   Diagnosis Date Noted   Acute respiratory failure with hypoxia ([place]) 12/10/2021   Severe sepsis ([place]) 12/10/2021   Multifocal pneumonia 12/10/2021   GERD (gastroesophageal reflux disease) 12/10/2021   Depression 12/10/2021   Chronic diastolic CHF (congestive heart failure) ([place]) 12/10/2021   Chronic anemia 12/10/2021   UTI (urinary tract infection) 11/28/2021   Prolonged QT interval 11/28/2021   Normocytic anemia 09/05/2021   Hyperkalemia 09/05/2021   Triggering of finger 05/17/2021   Heart disease 05/17/2021   Malignant tumor of breast ([place]) 05/17/2021   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[place]) 05/17/2021   Hypertensive disorder 05/17/2021   Lower extremity edema 05/14/2021   Benign paroxysmal positional vertigo of left ear 03/23/2021   Daytime somnolence 03/23/2021   History of sleep apnea 03/23/2021   Orthostatic hypotension 03/23/2021   Migraine without aura and without status migrainosus, not intractable 03/23/2021   Coronary  arteriosclerosis in native artery 09/28/2016   Elevated LFTs 09/28/2016   Rotator cuff syndrome 09/28/2016   Biceps tendinitis 09/28/2016   Cobalamin deficiency 09/28/2016   Hyperlipidemia 09/28/2016   Migraine 09/28/2016   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[place]) 09/28/2016   Vitamin D deficiency 09/28/2016    Past Surgical History:  Procedure Laterality Date   BREAST SURGERY     CARDIAC CATHETERIZATION     CATARACT EXTRACTION     CHOLECYSTECTOMY  1979   DG  BONE DENSITY ([place] HX)     ENDOMETRIAL ABLATION     GASTRIC BYPASS     MASTECTOMY     OTHER SURGICAL HISTORY     CANCER SURGERY   TOTAL HIP ARTHROPLASTY Left 09/06/2021   Procedure: TOTAL HIP ARTHROPLASTY;  Surgeon: Willaim Sheng, MD;  Location: [facility];  Service: Orthopedics;  Laterality: Left;    OB History     Gravida  2   Para  2   Term  2   Preterm  0   AB  0   Living  2      SAB  0   IAB  0   Ectopic  0   Multiple  0   Live Births  2            Home Medications    Prior to Admission medications   Medication Sig Start Date End Date Taking? Authorizing Provider  cephALEXin (KEFLEX) 500 MG capsule Take 1 capsule (500 mg total) by mouth 2 (two) times daily.  01/18/22  Yes Volney American, PA-C  apixaban (ELIQUIS) 5 MG TABS tablet Take 2 tablets (10 mg total) by mouth 2 (two) times daily for 2 days, THEN 1 tablet (5 mg total) 2 (two) times daily for 28 days. 12/18/21 01/17/22  Damita Lack, MD  aspirin 81 MG EC tablet Take 1 tablet (81 mg total) by mouth every other day. Swallow whole.  Hold aspirin while taking eliquis, Patient taking differently: Take 81 mg by mouth daily. Swallow whole. 09/10/21   Florencia Reasons, MD  carvedilol (COREG) 3.125 MG tablet Take 1 tablet (3.125 mg total) by mouth 2 (two) times daily. Hold if sbp less than 100 or heart rate less than 50 12/03/21   Florencia Reasons, MD  Cholecalciferol 25 MCG (1000 UT) tablet Take 1,000 Units by mouth daily.    [provider]  ezetimibe-simvastatin (VYTORIN) 10-80 MG tablet Take 1 tablet by mouth at bedtime.    [provider]  ferrous sulfate 325 (65 FE) MG EC tablet Take 1 tablet (325 mg total) by mouth every Monday, Wednesday, and Friday. 09/10/21 12/09/21  Florencia Reasons, MD  folic acid (FOLVITE) 1 MG tablet Take 1 tablet (1 mg total) by mouth daily. 12/03/21 12/03/22  Florencia Reasons, MD  hydrochlorothiazide (HYDRODIURIL) 25 MG tablet Take 25 mg by mouth daily as needed (swelling/fluid).    [provider]  lidocaine (XYLOCAINE) 2 % solution Use as directed 15 mLs in the mouth or throat every 6 (six) hours as needed for mouth pain. Patient not taking: Reported on 12/09/2021 12/03/21   Florencia Reasons, MD  lidocaine (XYLOCAINE) 5 % ointment Apply 1 application topically as needed. Patient not taking: Reported on 12/09/2021 06/30/21   LongWonda Olds, MD  NURTEC 75 MG TBDP Take 75 mg by mouth daily as needed for headache. 05/19/21   [provider]  oxyCODONE (OXY IR/ROXICODONE) 5 MG immediate release tablet Take 1 tablet (5 mg total) by mouth every 4 (four) hours as needed for severe pain. 12/16/21   Amin, Jeanella Flattery, MD  pantoprazole (PROTONIX) 40 MG tablet Take 1 tablet (40 mg total) by mouth 2 (two) times daily before a meal. 11/01/21   Vladimir Crofts, PA-C  polyethylene glycol (MIRALAX / GLYCOLAX) 17 g packet Take 17 g by mouth daily. Patient taking differently: Take 17 g by mouth daily as needed for mild constipation. 09/11/21   Florencia Reasons, MD  Polyethylene Glycol 400 (BLINK TEARS) 0.25 % SOLN Place 1 drop into both eyes daily as needed (dry eyes).    [provider]  senna-docusate (SENOKOT-S) 8.6-50 MG tablet Take 1 tablet by mouth at bedtime. 09/10/21   Florencia Reasons, MD  sertraline (ZOLOFT) 100 MG tablet Take 100 mg by mouth daily.    [provider]  thiamine 100 MG tablet Take 1 tablet (100 mg total) by mouth daily. 12/03/21   Florencia Reasons, MD  tiZANidine (ZANAFLEX) 4 MG tablet Take 1 tablet  (4 mg total) by mouth at bedtime. 12/16/21   Amin, Jeanella Flattery, MD  valsartan (DIOVAN) 320 MG tablet Take 320 mg by mouth daily. 11/28/21   [provider]  vitamin B-12 (CYANOCOBALAMIN) 1000 MCG tablet Take 1,000 mcg by mouth daily.    [provider]    Family History Family History  Problem Relation Age of Onset   Congestive Heart Failure Mother    Coronary artery disease Father    Heart attack Father 11       cause  of death.   Diabetes Mellitus II Sister    Hypertension Sister    Breast cancer Sister    Migraines Neg Hx    Colon cancer Neg Hx    Esophageal cancer Neg Hx    Stomach cancer Neg Hx     Social History Social History   Tobacco Use   Smoking status: Former    Passive exposure: Never   Smokeless tobacco: Never   Tobacco comments:    Quit 30 yrs ago  Vaping Use   Vaping Use: Never used  Substance Use Topics   Alcohol use: Yes    Alcohol/week: 14.0 standard drinks of alcohol    Types: 14 Glasses of wine per week    Comment: daily, 5 glasses wine   Drug use: Never     Allergies   Contrast media [iodinated contrast media], Ciprofloxacin, Erythromycin base, and Sulfabenzamide   Review of Systems Review of Systems Per HPI  Physical Exam Triage Vital Signs ED Triage Vitals  Enc Vitals Group     BP 01/18/22 0839 124/79     Pulse Rate 01/18/22 0839 97     Resp 01/18/22 0839 20     Temp 01/18/22 0839 99.4 F (37.4 C)     Temp Source 01/18/22 0839 Oral     SpO2 01/18/22 0839 94 %     Weight --      Height --      Head Circumference --      Peak Flow --      Pain Score 01/18/22 0840 7     Pain Loc --      Pain Edu? --      Excl. in [place]? --    No data found.  Updated Vital Signs BP 124/79 (BP Location: Right Arm)   Pulse 97   Temp 99.4 F (37.4 C) (Oral)   Resp 20   SpO2 94%   Visual Acuity Right Eye Distance:   Left Eye Distance:   Bilateral Distance:    Right Eye Near:   Left Eye Near:    Bilateral Near:      Physical Exam Vitals and nursing note reviewed.  Constitutional:      Appearance: Normal appearance. She is not ill-appearing.  HENT:     Head: Atraumatic.  Eyes:     Extraocular Movements: Extraocular movements intact.     Conjunctiva/sclera: Conjunctivae normal.  Cardiovascular:     Rate and Rhythm: Normal rate and regular rhythm.     Heart sounds: Normal heart sounds.  Pulmonary:     Effort: Pulmonary effort is normal.     Breath sounds: Normal breath sounds.  Abdominal:     General: Bowel sounds are normal. There is no distension.     Palpations: Abdomen is soft.     Tenderness: There is no abdominal tenderness. There is no right CVA tenderness, left CVA tenderness or guarding.  Musculoskeletal:        General: Normal range of motion.     Cervical back: Normal range of motion and neck supple.  Skin:    General: Skin is warm and dry.  Neurological:     Mental Status: She is alert and oriented to person, place, and time.  Psychiatric:        Mood and Affect: Mood normal.        Thought Content: Thought content normal.        Judgment: Judgment normal.      UC Treatments / Results  Labs (all labs ordered are listed, but only abnormal results are displayed) Labs Reviewed  URINE CULTURE - Abnormal; Notable for the following components:      Result Value   Culture MULTIPLE SPECIES PRESENT, SUGGEST RECOLLECTION (*)    All other components within normal limits  POCT URINALYSIS DIP (MANUAL ENTRY) - Abnormal; Notable for the following components:   Clarity, UA cloudy (*)    Blood, UA trace-intact (*)    Leukocytes, UA Large (3+) (*)    All other components within normal limits    EKG   Radiology No results found.  Procedures Procedures (including critical care time)  Medications Ordered in UC Medications - No data to display  Initial Impression / Assessment and Plan / UC Course  I have reviewed the triage vital signs and the nursing notes.  Pertinent labs  & imaging results that were available during my care of the patient were reviewed by me and considered in my medical decision making (see chart for details).     Urinalysis with evidence of urinary tract infection.  Treat with Keflex while awaiting urine culture results.  Push fluids, return for worsening symptoms.  Final Clinical Impressions(s) / UC Diagnoses   Final diagnoses:  Acute lower UTI   Discharge Instructions   None    ED Prescriptions     Medication Sig Dispense Auth. Provider   cephALEXin (KEFLEX) 500 MG capsule Take 1 capsule (500 mg total) by mouth 2 (two) times daily. 10 capsule Volney American, [place]      PDMP not reviewed this encounter.   Volney American, [place] 01/21/22 1844

## 2022-01-25 DIAGNOSIS — F331 Major depressive disorder, recurrent, moderate: Secondary | ICD-10-CM | POA: Diagnosis not present

## 2022-02-03 ENCOUNTER — Ambulatory Visit: Payer: Medicare PPO | Admitting: Family Medicine

## 2022-02-03 ENCOUNTER — Encounter: Payer: Self-pay | Admitting: Family Medicine

## 2022-02-03 ENCOUNTER — Telehealth: Payer: Self-pay

## 2022-02-03 VITALS — BP 172/112 | HR 109 | Ht <= 58 in | Wt 176.2 lb

## 2022-02-03 DIAGNOSIS — R7301 Impaired fasting glucose: Secondary | ICD-10-CM | POA: Diagnosis not present

## 2022-02-03 DIAGNOSIS — Z13820 Encounter for screening for osteoporosis: Secondary | ICD-10-CM | POA: Diagnosis not present

## 2022-02-03 DIAGNOSIS — I2510 Atherosclerotic heart disease of native coronary artery without angina pectoris: Secondary | ICD-10-CM | POA: Diagnosis not present

## 2022-02-03 DIAGNOSIS — I10 Essential (primary) hypertension: Secondary | ICD-10-CM | POA: Diagnosis not present

## 2022-02-03 DIAGNOSIS — E559 Vitamin D deficiency, unspecified: Secondary | ICD-10-CM

## 2022-02-03 MED ORDER — TELMISARTAN 40 MG PO TABS
40.0000 mg | ORAL_TABLET | Freq: Every day | ORAL | 1 refills | Status: DC
Start: 2022-02-03 — End: 2022-02-25

## 2022-02-03 NOTE — Patient Instructions (Addendum)
I appreciate the opportunity to provide care to you today!    Follow up:  1 week  Labs: please stop by the lab today to get your blood drawn (CBC, BMP TSH, Lipid profile, HgA1c, Vit D)  Telmisartan -Take each dose at the same time each day, with or without food -  the first few days of therapy you may experience dizziness/light-headedness from your body adjusting to a lower blood pressure -Avoid use of over-the-counter NSAIDs (e.g. ibuprofen, naproxen) -Use of NSAIDs, including selective COX-2 inhibitors, with this medication may result in increased risk of renal impairment (including acute renal failure) and decreased antihypertensive effect.   Please continue to a heart-healthy diet and increase your physical activities. Try to exercise for 5mns at least three times a week.      It was a pleasure to see you and I look forward to continuing to work together on your health and well-being. Please do not hesitate to call the office if you need care or have questions about your care.   Have a wonderful day and week. With Gratitude, GAlvira MondayMSN, FNP-BC

## 2022-02-03 NOTE — Progress Notes (Signed)
New Patient Office Visit  Subjective:  Patient ID: Heather Garrett, female    DOB: 02-15-1951  Age: 71 y.o. MRN: [id number]  CC:  Chief Complaint  Patient presents with   New Patient (Initial Visit)    Pt establishing care, recently moved here from out of town, would like to discuss high blood pressure. Due for mammogram scheduled with Dr. Julien Girt in 04/2022.     HPI Heather Garrett is a 71 y.o. female with past medical history of chronic diastolic congestive heart failure, pulmonary hypertension, major depressive disorder, anemia, acute respiratory failure with hypoxia, atherosclerosis of aorta presents for establishing care. HTN: Uncontrolled. C/o of dizziness, lightheadedness, and blurred vision. Reports being out of her BP medication for 3 weeks. Reports taking lisinopril 5 mg daily but recently relocated and lost PCP. Ambulatory reading in the 962E-366Q systolic and 94T-65Y diastolic. Denies chest pain and dyspnea.   Past Medical History:  Diagnosis Date   Biceps tendinitis    Bladder cancer ([place]) 2014   Breast cancer ([place]) 1990   Cobalamin deficiency    Coronary arteriosclerosis in native artery    Elevated liver function tests    Hiatal hernia    Hyperlipidemia    Migraine    Myocardial infarction ([facility])    Rotator cuff syndrome    Transitional cell carcinoma, bladder ([facility])    Vitamin D deficiency     Past Surgical History:  Procedure Laterality Date   BREAST SURGERY     CARDIAC CATHETERIZATION     CATARACT EXTRACTION     CHOLECYSTECTOMY  1979   DG  BONE DENSITY ([place] HX)     ENDOMETRIAL ABLATION     GASTRIC BYPASS     MASTECTOMY     OTHER SURGICAL HISTORY     CANCER SURGERY   TOTAL HIP ARTHROPLASTY Left 09/06/2021   Procedure: TOTAL HIP ARTHROPLASTY;  Surgeon: Willaim Sheng, MD;  Location: [facility];  Service: Orthopedics;  Laterality: Left;    Family History  Problem Relation Age of Onset   Congestive Heart Failure Mother    Coronary artery  disease Father    Heart attack Father 62       cause of death.   Diabetes Mellitus II Sister    Hypertension Sister    Breast cancer Sister    Migraines Neg Hx    Colon cancer Neg Hx    Esophageal cancer Neg Hx    Stomach cancer Neg Hx     Social History   Socioeconomic History   Marital status: Married    Spouse name: Not on file   Number of children: Not on file   Years of education: Not on file   Highest education level: Not on file  Occupational History   Not on file  Tobacco Use   Smoking status: Former    Passive exposure: Never   Smokeless tobacco: Never   Tobacco comments:    Quit 30 yrs ago  Vaping Use   Vaping Use: Never used  Substance and Sexual Activity   Alcohol use: Yes    Alcohol/week: 14.0 standard drinks of alcohol    Types: 14 Glasses of wine per week    Comment: daily, 5 glasses wine   Drug use: Never   Sexual activity: Not on file  Other Topics Concern   Not on file  Social History Narrative   Not on file   Social Determinants of Health   Financial Resource Strain: Not on file  Food Insecurity: Not on file  Transportation Needs: Not on file  Physical Activity: Not on file  Stress: Not on file  Social Connections: Not on file  Intimate Partner Violence: Not on file    ROS Review of Systems  Constitutional:  Negative for fatigue and fever.  HENT:  Negative for rhinorrhea, sinus pressure and sinus pain.   Eyes:  Positive for visual disturbance. Negative for photophobia and redness.  Cardiovascular:  Negative for chest pain and palpitations.  Gastrointestinal:  Negative for diarrhea, nausea and vomiting.  Endocrine: Negative for polydipsia, polyphagia and polyuria.  Genitourinary:  Negative for frequency and urgency.  Musculoskeletal:  Negative for back pain and neck pain.  Skin:  Negative for rash and wound.  Neurological:  Positive for dizziness, light-headedness and headaches. Negative for tremors and weakness.  Hematological:   Bruises/bleeds easily.  Psychiatric/Behavioral:  Negative for confusion, self-injury and suicidal ideas.     Objective:   Today's Vitals: BP (!) 172/112   Pulse (!) 109   Ht '4\' 8"'$  (1.422 m)   Wt 176 lb 3.2 oz (79.9 kg)   SpO2 96%   BMI 39.50 kg/m   Physical Exam HENT:     Head: Normocephalic.     Right Ear: External ear normal.     Left Ear: External ear normal.     Nose: No congestion.     Mouth/Throat:     Pharynx: No oropharyngeal exudate.  Eyes:     Extraocular Movements: Extraocular movements intact.     Pupils: Pupils are equal, round, and reactive to light.  Cardiovascular:     Rate and Rhythm: Normal rate and regular rhythm.     Pulses: Normal pulses.     Heart sounds: Normal heart sounds.  Pulmonary:     Effort: Pulmonary effort is normal.  Abdominal:     Palpations: Abdomen is soft.  Musculoskeletal:     Cervical back: No rigidity.     Right lower leg: No edema.     Left lower leg: No edema.  Skin:    General: Skin is warm.  Neurological:     Mental Status: She is alert and oriented to person, place, and time.  Psychiatric:     Comments: Normal affect     Assessment & Plan:   Problem List Items Addressed This Visit       Cardiovascular and Mediastinum   Coronary arteriosclerosis in native artery   Relevant Medications   telmisartan (MICARDIS) 40 MG tablet   Other Relevant Orders   Lipid Profile   Hypertensive disorder - Primary    BP Readings from Last 3 Encounters:  02/03/22 (!) 172/112  01/18/22 124/79  12/17/21 132/86  Uncontrolled -C/o of dizziness, lightheadedness, and blurred vision. -According to the American Heart Association, Hypertensive urgency or emergency is defined as >180/120 -Given that she's symptomatic with her elevated BP, the patient was advised to go to the ED, but she politely declined.  -Will start pt on telmisartan 40 mg and f/u in 1 week -EKG shows no signs of acute ischemia  -Encouraged pt to get labs in 1  week -Encouraged heart-healthy diet and increased physical activities -Recommended monitoring BP daily and bringing ambulatory readings at her next appointment -Reviewed Dash's eating plan and limiting salt intake -Encouraged exercising X3 a week  for 30 mins       Relevant Medications   telmisartan (MICARDIS) 40 MG tablet   Other Relevant Orders   TSH + free T4  CBC with Differential/Platelet   Basic Metabolic Panel (BMET)   EKG 12-Lead (Completed)     Other   Vitamin D deficiency   Relevant Orders   Vitamin D (25 hydroxy)   Other Visit Diagnoses     Osteoporosis screening       Relevant Orders   DG Bone Density   IFG (impaired fasting glucose)       Relevant Orders   Hemoglobin A1C       Outpatient Encounter Medications as of 02/03/2022  Medication Sig   aspirin 81 MG EC tablet Take 1 tablet (81 mg total) by mouth every other day. Swallow whole.  Hold aspirin while taking eliquis, (Patient taking differently: Take 81 mg by mouth daily. Swallow whole.)   carvedilol (COREG) 3.125 MG tablet Take 1 tablet (3.125 mg total) by mouth 2 (two) times daily. Hold if sbp less than 100 or heart rate less than 50   Cholecalciferol 25 MCG (1000 UT) tablet Take 1,000 Units by mouth daily.   ezetimibe-simvastatin (VYTORIN) 10-80 MG tablet Take 1 tablet by mouth at bedtime.   folic acid (FOLVITE) 1 MG tablet Take 1 tablet (1 mg total) by mouth daily.   hydrochlorothiazide (HYDRODIURIL) 25 MG tablet Take 25 mg by mouth daily as needed (swelling/fluid).   NURTEC 75 MG TBDP Take 75 mg by mouth daily as needed for headache.   pantoprazole (PROTONIX) 40 MG tablet Take 1 tablet (40 mg total) by mouth 2 (two) times daily before a meal.   polyethylene glycol (MIRALAX / GLYCOLAX) 17 g packet Take 17 g by mouth daily. (Patient taking differently: Take 17 g by mouth daily as needed for mild constipation.)   Polyethylene Glycol 400 (BLINK TEARS) 0.25 % SOLN Place 1 drop into both eyes daily as  needed (dry eyes).   sertraline (ZOLOFT) 100 MG tablet Take 100 mg by mouth daily.   telmisartan (MICARDIS) 40 MG tablet Take 1 tablet (40 mg total) by mouth daily.   thiamine 100 MG tablet Take 1 tablet (100 mg total) by mouth daily.   tiZANidine (ZANAFLEX) 4 MG tablet Take 1 tablet (4 mg total) by mouth at bedtime.   vitamin B-12 (CYANOCOBALAMIN) 1000 MCG tablet Take 1,000 mcg by mouth daily.   apixaban (ELIQUIS) 5 MG TABS tablet Take 2 tablets (10 mg total) by mouth 2 (two) times daily for 2 days, THEN 1 tablet (5 mg total) 2 (two) times daily for 28 days.   cephALEXin (KEFLEX) 500 MG capsule Take 1 capsule (500 mg total) by mouth 2 (two) times daily. (Patient not taking: Reported on 02/03/2022)   ferrous sulfate 325 (65 FE) MG EC tablet Take 1 tablet (325 mg total) by mouth every Monday, Wednesday, and Friday.   lidocaine (XYLOCAINE) 2 % solution Use as directed 15 mLs in the mouth or throat every 6 (six) hours as needed for mouth pain. (Patient not taking: Reported on 02/03/2022)   lidocaine (XYLOCAINE) 5 % ointment Apply 1 application topically as needed. (Patient not taking: Reported on 02/03/2022)   oxyCODONE (OXY IR/ROXICODONE) 5 MG immediate release tablet Take 1 tablet (5 mg total) by mouth every 4 (four) hours as needed for severe pain.   senna-docusate (SENOKOT-S) 8.6-50 MG tablet Take 1 tablet by mouth at bedtime.   [DISCONTINUED] valsartan (DIOVAN) 320 MG tablet Take 320 mg by mouth daily. (Patient not taking: Reported on 02/03/2022)   No facility-administered encounter medications on file as of 02/03/2022.    Follow-up: Return in about 1  week (around 02/10/2022) for BP.   [name], FNP

## 2022-02-03 NOTE — Assessment & Plan Note (Signed)
BP Readings from Last 3 Encounters:  02/03/22 (!) 172/112  01/18/22 124/79  12/17/21 132/86  Uncontrolled -C/o of dizziness, lightheadedness, and blurred vision. -According to the American Heart Association, Hypertensive urgency or emergency is defined as >180/120 -Given that she's symptomatic with her elevated BP, the patient was advised to go to the ED, but she politely declined.  -Will start pt on telmisartan 40 mg and f/u in 1 week -EKG shows no signs of acute ischemia  -Encouraged pt to get labs in 1 week -Encouraged heart-healthy diet and increased physical activities -Recommended monitoring BP daily and bringing ambulatory readings at her next appointment -Reviewed Dash's eating plan and limiting salt intake -Encouraged exercising X3 a week  for 30 mins

## 2022-02-03 NOTE — Telephone Encounter (Signed)
[place] called from [facility] needs to know will patient continue home health care services?  If so needs to confirm that Peter [facility] will sign home health orders for patient. [place] call back # [phone].

## 2022-02-04 NOTE — Telephone Encounter (Signed)
Returned call left vm asking for forms to be faxed to our office for completion.

## 2022-02-04 NOTE — Telephone Encounter (Signed)
Spoke to pt states she is in need of physical and occupational therapy.

## 2022-02-08 ENCOUNTER — Telehealth: Payer: Self-pay

## 2022-02-08 ENCOUNTER — Other Ambulatory Visit: Payer: Self-pay

## 2022-02-08 NOTE — Telephone Encounter (Signed)
Patient called said Heather Garrett would fill her meds no longer gets from other providers   tiZANidine (ZANAFLEX) 4 MG tablet    Pharmacy: [facility]

## 2022-02-09 ENCOUNTER — Other Ambulatory Visit: Payer: Self-pay

## 2022-02-09 DIAGNOSIS — I2510 Atherosclerotic heart disease of native coronary artery without angina pectoris: Secondary | ICD-10-CM | POA: Diagnosis not present

## 2022-02-09 DIAGNOSIS — M75100 Unspecified rotator cuff tear or rupture of unspecified shoulder, not specified as traumatic: Secondary | ICD-10-CM

## 2022-02-09 DIAGNOSIS — E559 Vitamin D deficiency, unspecified: Secondary | ICD-10-CM | POA: Diagnosis not present

## 2022-02-09 DIAGNOSIS — I10 Essential (primary) hypertension: Secondary | ICD-10-CM | POA: Diagnosis not present

## 2022-02-09 DIAGNOSIS — R7301 Impaired fasting glucose: Secondary | ICD-10-CM | POA: Diagnosis not present

## 2022-02-09 MED ORDER — TIZANIDINE HCL 4 MG PO TABS: 4.0000 mg | ORAL_TABLET | Freq: Every day | ORAL | 0 refills | Status: DC

## 2022-02-09 NOTE — Telephone Encounter (Signed)
Refill sent.

## 2022-02-09 NOTE — Progress Notes (Signed)
Rx refill

## 2022-02-11 ENCOUNTER — Ambulatory Visit: Payer: Medicare PPO | Admitting: Family Medicine

## 2022-02-12 LAB — CBC WITH DIFFERENTIAL/PLATELET
Basophils Absolute: 0.1 10*3/uL (ref 0.0–0.2)
Basos: 1 %
EOS (ABSOLUTE): 0.3 10*3/uL (ref 0.0–0.4)
Eos: 3 %
Hematocrit: 34.1 % (ref 34.0–46.6)
Hemoglobin: 11 g/dL — ABNORMAL LOW (ref 11.1–15.9)
Immature Grans (Abs): 0 10*3/uL (ref 0.0–0.1)
Immature Granulocytes: 0 %
Lymphocytes Absolute: 2 10*3/uL (ref 0.7–3.1)
Lymphs: 24 %
MCH: 31.3 pg (ref 26.6–33.0)
MCHC: 32.3 g/dL (ref 31.5–35.7)
MCV: 97 fL (ref 79–97)
Monocytes Absolute: 0.9 10*3/uL (ref 0.1–0.9)
Monocytes: 11 %
Neutrophils Absolute: 4.9 10*3/uL (ref 1.4–7.0)
Neutrophils: 61 %
Platelets: 329 10*3/uL (ref 150–450)
RBC: 3.52 x10E6/uL — ABNORMAL LOW (ref 3.77–5.28)
RDW: 15.5 % — ABNORMAL HIGH (ref 11.7–15.4)
WBC: 8.1 10*3/uL (ref 3.4–10.8)

## 2022-02-12 LAB — BASIC METABOLIC PANEL
BUN/Creatinine Ratio: 14 (ref 12–28)
BUN: 11 mg/dL (ref 8–27)
CO2: 16 mmol/L — ABNORMAL LOW (ref 20–29)
Calcium: 9.2 mg/dL (ref 8.7–10.3)
Chloride: 104 mmol/L (ref 96–106)
Creatinine, Ser: 0.77 mg/dL (ref 0.57–1.00)
Glucose: 100 mg/dL — ABNORMAL HIGH (ref 70–99)
Potassium: 5.7 mmol/L — ABNORMAL HIGH (ref 3.5–5.2)
Sodium: 142 mmol/L (ref 134–144)
eGFR: 82 mL/min/{1.73_m2} (ref >59)

## 2022-02-12 LAB — LIPID PANEL
Chol/HDL Ratio: 1.4 ratio (ref 0.0–4.4)
Cholesterol, Total: 221 mg/dL — ABNORMAL HIGH (ref 100–199)
HDL: 155 mg/dL (ref >39)
LDL Chol Calc (NIH): 52 mg/dL (ref 0–99)
Triglycerides: 86 mg/dL (ref 0–149)
VLDL Cholesterol Cal: 14 mg/dL (ref 5–40)

## 2022-02-12 LAB — HEMOGLOBIN A1C
Est. average glucose Bld gHb Est-mCnc: 100 mg/dL
Hgb A1c MFr Bld: 5.1 % (ref 4.8–5.6)

## 2022-02-12 LAB — TSH+FREE T4
Free T4: 0.83 ng/dL (ref 0.82–1.77)
TSH: 2.57 u[IU]/mL (ref 0.450–4.500)

## 2022-02-12 LAB — VITAMIN D 25 HYDROXY (VIT D DEFICIENCY, FRACTURES): Vit D, 25-Hydroxy: 11.3 ng/mL — ABNORMAL LOW (ref 30.0–100.0)

## 2022-02-15 ENCOUNTER — Ambulatory Visit (HOSPITAL_COMMUNITY)
Admission: RE | Admit: 2022-02-15 | Discharge: 2022-02-15 | Disposition: A | Discharge status: Home / Self Care | Payer: Medicare PPO | Source: Ambulatory Visit | Attending: Family Medicine | Admitting: Family Medicine

## 2022-02-15 DIAGNOSIS — Z13820 Encounter for screening for osteoporosis: Secondary | ICD-10-CM | POA: Diagnosis not present

## 2022-02-15 DIAGNOSIS — Z780 Asymptomatic menopausal state: Secondary | ICD-10-CM | POA: Diagnosis not present

## 2022-02-15 DIAGNOSIS — M810 Age-related osteoporosis without current pathological fracture: Secondary | ICD-10-CM | POA: Diagnosis not present

## 2022-02-16 ENCOUNTER — Encounter: Payer: Self-pay | Admitting: Family Medicine

## 2022-02-17 ENCOUNTER — Other Ambulatory Visit: Payer: Self-pay | Admitting: Family Medicine

## 2022-02-17 DIAGNOSIS — E559 Vitamin D deficiency, unspecified: Secondary | ICD-10-CM

## 2022-02-17 DIAGNOSIS — M810 Age-related osteoporosis without current pathological fracture: Secondary | ICD-10-CM

## 2022-02-17 MED ORDER — VITAMIN D (ERGOCALCIFEROL) 1.25 MG (50000 UNIT) PO CAPS: 50000.0000 [IU] | ORAL_CAPSULE | ORAL | 1 refills | Status: DC

## 2022-02-17 MED ORDER — ALENDRONATE SODIUM 70 MG PO TABS: 70.0000 mg | ORAL_TABLET | ORAL | 11 refills | Status: DC

## 2022-02-17 NOTE — Telephone Encounter (Signed)
Please ask the patient if she would be available for a tele visit today

## 2022-02-18 ENCOUNTER — Ambulatory Visit (HOSPITAL_COMMUNITY)
Admission: RE | Admit: 2022-02-18 | Discharge: 2022-02-18 | Disposition: A | Discharge status: Home / Self Care | Payer: Medicare PPO | Source: Ambulatory Visit | Attending: Family Medicine | Admitting: Family Medicine

## 2022-02-18 ENCOUNTER — Encounter: Payer: Self-pay | Admitting: Family Medicine

## 2022-02-18 ENCOUNTER — Ambulatory Visit: Payer: Medicare PPO | Admitting: Family Medicine

## 2022-02-18 VITALS — BP 136/88 | HR 72 | Ht <= 58 in | Wt 177.0 lb

## 2022-02-18 DIAGNOSIS — M25572 Pain in left ankle and joints of left foot: Secondary | ICD-10-CM | POA: Diagnosis not present

## 2022-02-18 DIAGNOSIS — G8929 Other chronic pain: Secondary | ICD-10-CM | POA: Diagnosis not present

## 2022-02-18 DIAGNOSIS — M7989 Other specified soft tissue disorders: Secondary | ICD-10-CM | POA: Diagnosis not present

## 2022-02-18 NOTE — Patient Instructions (Signed)
I appreciate the opportunity to provide care to you today!    Please stop by [facility] hospital anytime to get an x-ray of the left ankle   I recommend OTC capsaicin cream to the affected site  Please stop by your local pharmacy and get your Tdap and Shingles vaccine  Referrals today-    Please continue to a heart-healthy diet and increase your physical activities. Try to exercise for 32mns at least three times a week.      It was a pleasure to see you and I look forward to continuing to work together on your health and well-being. Please do not hesitate to call the office if you need care or have questions about your care.   Have a wonderful day and week. With Gratitude, GAlvira MondayMSN, FNP-BC

## 2022-02-18 NOTE — Progress Notes (Addendum)
Established Patient Office Visit  Subjective:  Patient ID: Heather Garrett, female    DOB: 31-Oct-1950  Age: 71 y.o. MRN: [id number]  CC:  Chief Complaint  Patient presents with   Leg Pain    Pt c/o leg pain and swelling has been ongoing since 6 months ago after a fall.    Headache    Pt c/o waking up with a slight headache, some days worse than others only been using tylenol but hasnt been helping.     HPI Heather Garrett is a 71 y.o. female with past medical history of HTN presents for f/u of  chronic medical conditions. Ankle pain: c/o of ankle pain and left lower extremity edema since falling 6 months ago. She reports that her swelling has subsided, but the pain persists. She notes pain with dorsiflexion, ambulation, and prolonged standing. Pain is located at the tibiotalar joint of the left ankle.     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[facility])    Vitamin D deficiency     Past Surgical History:  Procedure Laterality Date   BREAST SURGERY     CARDIAC CATHETERIZATION     CATARACT EXTRACTION     CHOLECYSTECTOMY  1979   DG  BONE DENSITY ([place] [facility])     ENDOMETRIAL ABLATION     GASTRIC BYPASS     MASTECTOMY     OTHER SURGICAL HISTORY     CANCER SURGERY   TOTAL HIP ARTHROPLASTY Left 09/06/2021   Procedure: TOTAL HIP ARTHROPLASTY;  Surgeon: Willaim Sheng, MD;  Location: [facility];  Service: Orthopedics;  Laterality: Left;    Family History  Problem Relation Age of Onset   Congestive Heart Failure Mother    Coronary artery disease Father    Heart attack Father 51       cause of death.   Diabetes Mellitus II Sister    Hypertension  Sister    Breast cancer Sister    Migraines Neg Hx    Colon cancer Neg Hx    Esophageal cancer Neg Hx    Stomach cancer Neg Hx     Social History   Socioeconomic History   Marital status: Married    Spouse name: Not on file   Number of children: Not on file   Years of education: Not on file   Highest education level: Not on file  Occupational History   Not on file  Tobacco Use   Smoking status: Former    Passive exposure: Never   Smokeless tobacco: Never   Tobacco comments:    Quit 30 yrs ago  Vaping Use   Vaping Use: Never used  Substance and Sexual Activity   Alcohol use: Yes    Alcohol/week: 14.0 standard drinks of alcohol    Types: 14 Glasses of wine per week    Comment: daily, 5 glasses wine   Drug use: Never   Sexual activity: Not on file  Other Topics Concern   Not on file  Social History Narrative   Not on file   Social Determinants of Health   Financial  Resource Strain: Not on file  Food Insecurity: Not on file  Transportation Needs: Not on file  Physical Activity: Not on file  Stress: Not on file  Social Connections: Not on file  Intimate Partner Violence: Not on file    Outpatient Medications Prior to Visit  Medication Sig Dispense Refill   aspirin 81 MG EC tablet Take 1 tablet (81 mg total) by mouth every other day. Swallow whole.  Hold aspirin while taking eliquis, (Patient taking differently: Take 81 mg by mouth daily. Swallow whole.) 30 tablet 0   carvedilol (COREG) 3.125 MG tablet Take 1 tablet (3.125 mg total) by mouth 2 (two) times daily. Hold if sbp less than 100 or heart rate less than 50     Cholecalciferol 25 MCG (1000 UT) tablet Take 1,000 Units by mouth daily.     folic acid (FOLVITE) 1 MG tablet Take 1 tablet (1 mg total) by mouth daily.  3   hydrochlorothiazide (HYDRODIURIL) 25 MG tablet Take 25 mg by mouth daily as needed (swelling/fluid).     NURTEC 75 MG TBDP Take 75 mg by mouth daily as needed for headache.     pantoprazole  (PROTONIX) 40 MG tablet Take 1 tablet (40 mg total) by mouth 2 (two) times daily before a meal. 60 tablet 2   polyethylene glycol (MIRALAX / GLYCOLAX) 17 g packet Take 17 g by mouth daily. (Patient taking differently: Take 17 g by mouth daily as needed for mild constipation.) 14 each 0   Polyethylene Glycol 400 (BLINK TEARS) 0.25 % SOLN Place 1 drop into both eyes daily as needed (dry eyes).     thiamine 100 MG tablet Take 1 tablet (100 mg total) by mouth daily.     Vitamin D, Ergocalciferol, (DRISDOL) 1.25 MG (50000 UNIT) CAPS capsule Take 1 capsule (50,000 Units total) by mouth every 7 (seven) days. 5 capsule 1   ezetimibe-simvastatin (VYTORIN) 10-80 MG tablet Take 1 tablet by mouth at bedtime.     sertraline (ZOLOFT) 100 MG tablet Take 100 mg by mouth daily.     telmisartan (MICARDIS) 40 MG tablet Take 1 tablet (40 mg total) by mouth daily. 30 tablet 1   tiZANidine (ZANAFLEX) 4 MG tablet Take 1 tablet (4 mg total) by mouth at bedtime. 14 tablet 0   ferrous sulfate 325 (65 FE) MG EC tablet Take 1 tablet (325 mg total) by mouth every Monday, Wednesday, and Friday. 30 tablet 0   senna-docusate (SENOKOT-S) 8.6-50 MG tablet Take 1 tablet by mouth at bedtime. 14 tablet 0   vitamin B-12 (CYANOCOBALAMIN) 1000 MCG tablet Take 1,000 mcg by mouth daily.     alendronate (FOSAMAX) 70 MG tablet Take 1 tablet (70 mg total) by mouth every 7 (seven) days. Take with a full glass of water on an empty stomach. 4 tablet 11   apixaban (ELIQUIS) 5 MG TABS tablet Take 2 tablets (10 mg total) by mouth 2 (two) times daily for 2 days, THEN 1 tablet (5 mg total) 2 (two) times daily for 28 days. (Patient not taking: Reported on 02/28/2022) 64 tablet 0   cephALEXin (KEFLEX) 500 MG capsule Take 1 capsule (500 mg total) by mouth 2 (two) times daily. (Patient not taking: Reported on 02/18/2022) 10 capsule 0   lidocaine (XYLOCAINE) 2 % solution Use as directed 15 mLs in the mouth or throat every 6 (six) hours as needed for mouth  pain. (Patient not taking: Reported on 02/03/2022)  0   lidocaine (XYLOCAINE) 5 %  ointment Apply 1 application topically as needed. (Patient not taking: Reported on 02/03/2022) 35.44 g 0   oxyCODONE (OXY IR/ROXICODONE) 5 MG immediate release tablet Take 1 tablet (5 mg total) by mouth every 4 (four) hours as needed for severe pain. (Patient not taking: Reported on 02/28/2022) 15 tablet 0   No facility-administered medications prior to visit.    Allergies  Allergen Reactions   Contrast Media [Iodinated Contrast Media] Anaphylaxis and Hives   Ciprofloxacin Other (See Comments)    Interaction w other medicine   Erythromycin Base Nausea And Vomiting   Sulfabenzamide Other (See Comments)    UNK reaction    ROS Review of Systems  HENT:  Negative for sinus pressure and sore throat.   Gastrointestinal:  Negative for nausea and vomiting.  Musculoskeletal:  Positive for arthralgias and joint swelling. Negative for neck pain and neck stiffness.       Left ankle pain with left lower extremity swelling  Neurological:  Negative for dizziness and headaches.      Objective:    Physical Exam Cardiovascular:     Rate and Rhythm: Normal rate and regular rhythm.     Heart sounds: Normal heart sounds.  Pulmonary:     Effort: Pulmonary effort is normal.     Breath sounds: Normal breath sounds.  Musculoskeletal:     Left ankle: No swelling or deformity. Normal range of motion.     Comments: ROM intact bilaterally, pain with dorsiflexion of the left ankle +2 DP and PT  Neurological:     Mental Status: She is alert.     BP 136/88 (BP Location: Left Arm)   Pulse 72   Ht 4' 8"  (1.422 m)   Wt 177 lb 0.6 oz (80.3 kg)   SpO2 96%   BMI 39.69 kg/m  Wt Readings from Last 3 Encounters:  03/21/22 114 lb (51.7 kg)  02/28/22 115 lb 3.2 oz (52.3 kg)  02/18/22 177 lb 0.6 oz (80.3 kg)    Lab Results  Component Value Date   TSH 2.570 02/09/2022   Lab Results  Component Value Date   WBC 8.1  02/09/2022   HGB 11.0 (L) 02/09/2022   HCT 34.1 02/09/2022   MCV 97 02/09/2022   PLT 329 02/09/2022   Lab Results  Component Value Date   NA 142 02/09/2022   K 5.7 (H) 02/09/2022   CO2 16 (L) 02/09/2022   GLUCOSE 100 (H) 02/09/2022   BUN 11 02/09/2022   CREATININE 0.77 02/09/2022   BILITOT 0.5 12/15/2021   ALKPHOS 178 (H) 12/15/2021   AST 26 12/15/2021   ALT 13 12/15/2021   PROT 6.1 (L) 12/15/2021   ALBUMIN 2.3 (L) 12/15/2021   CALCIUM 9.2 02/09/2022   ANIONGAP 8 12/17/2021   EGFR 82 02/09/2022   GFR 72.00 06/16/2021   Lab Results  Component Value Date   CHOL 221 (H) 02/09/2022   Lab Results  Component Value Date   HDL 155 02/09/2022   Lab Results  Component Value Date   LDLCALC 52 02/09/2022   Lab Results  Component Value Date   TRIG 86 02/09/2022   Lab Results  Component Value Date   CHOLHDL 1.4 02/09/2022   Lab Results  Component Value Date   HGBA1C 5.1 02/09/2022      Assessment & Plan:   Problem List Items Addressed This Visit       Other   Left ankle pain - Primary    C/o of ankle pain and left  lower extremity edema since falling 6 months ago She reports that her swelling has subsided, but the pain persists She notes pain with dorsiflexion, ambulation, and prolonged standing  Pain is located at the tibiotalar joint of the left ankle Symptoms likely of ankle impingement ROM intact bilaterally pain reported with dorsiflexion Imaging studies ordered Pt cannot take NSAIDs as she is on Eliquis Opioids were offered, and pt declined Encouraged conservative management with Tylenol and heat application        Relevant Orders   DG Ankle Complete Left (Completed)    No orders of the defined types were placed in this encounter.   Follow-up: Return if symptoms worsen or fail to improve.    [name], FNP

## 2022-02-18 NOTE — Progress Notes (Signed)
I Reviewed labs with patient in office today

## 2022-02-20 ENCOUNTER — Other Ambulatory Visit: Payer: Self-pay | Admitting: Family Medicine

## 2022-02-20 DIAGNOSIS — M25572 Pain in left ankle and joints of left foot: Secondary | ICD-10-CM | POA: Insufficient documentation

## 2022-02-20 DIAGNOSIS — G8929 Other chronic pain: Secondary | ICD-10-CM

## 2022-02-20 NOTE — Assessment & Plan Note (Signed)
C/o of ankle pain and left lower extremity edema since falling 6 months ago She reports that her swelling has subsided, but the pain persists She notes pain with dorsiflexion, ambulation, and prolonged standing  Pain is located at the tibiotalar joint of the left ankle Symptoms likely of ankle impingement ROM intact bilaterally pain reported with dorsiflexion Imaging studies ordered Pt cannot take NSAIDs as she is on Eliquis Opioids were offered, and pt declined Encouraged conservative management with Tylenol and heat application

## 2022-02-20 NOTE — Progress Notes (Signed)
Please inform the patient that a referral has been placed to orthopedics surgery. 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

## 2022-02-21 ENCOUNTER — Other Ambulatory Visit: Payer: Self-pay | Admitting: Family Medicine

## 2022-02-21 ENCOUNTER — Ambulatory Visit: Payer: Medicare PPO | Admitting: Family Medicine

## 2022-02-21 DIAGNOSIS — M75100 Unspecified rotator cuff tear or rupture of unspecified shoulder, not specified as traumatic: Secondary | ICD-10-CM

## 2022-02-24 ENCOUNTER — Encounter: Payer: Self-pay | Admitting: Family Medicine

## 2022-02-24 NOTE — Telephone Encounter (Signed)
Pt scheduled today for october

## 2022-02-24 NOTE — Telephone Encounter (Signed)
Please advise 

## 2022-02-25 ENCOUNTER — Other Ambulatory Visit: Payer: Self-pay | Admitting: Nurse Practitioner

## 2022-02-25 DIAGNOSIS — I10 Essential (primary) hypertension: Secondary | ICD-10-CM

## 2022-02-25 MED ORDER — AMLODIPINE BESYLATE 2.5 MG PO TABS: 2.5000 mg | ORAL_TABLET | Freq: Every day | ORAL | 0 refills | Status: DC

## 2022-02-25 NOTE — Telephone Encounter (Signed)
Spoke with pt she is willing to try the amlodipine. Her leg does swell at times but this is due to her hurting it months ago not the medicine. Please advise

## 2022-02-28 ENCOUNTER — Ambulatory Visit (INDEPENDENT_AMBULATORY_CARE_PROVIDER_SITE_OTHER): Payer: Medicare PPO

## 2022-02-28 ENCOUNTER — Encounter: Payer: Self-pay | Admitting: Gastroenterology

## 2022-02-28 ENCOUNTER — Ambulatory Visit: Payer: Medicare PPO | Admitting: Gastroenterology

## 2022-02-28 ENCOUNTER — Other Ambulatory Visit: Payer: Medicare PPO

## 2022-02-28 ENCOUNTER — Telehealth: Payer: Self-pay

## 2022-02-28 VITALS — BP 134/80 | HR 74 | Ht <= 58 in | Wt 115.2 lb

## 2022-02-28 DIAGNOSIS — K700 Alcoholic fatty liver: Secondary | ICD-10-CM

## 2022-02-28 DIAGNOSIS — R159 Full incontinence of feces: Secondary | ICD-10-CM | POA: Diagnosis not present

## 2022-02-28 DIAGNOSIS — R1013 Epigastric pain: Secondary | ICD-10-CM | POA: Diagnosis not present

## 2022-02-28 DIAGNOSIS — R197 Diarrhea, unspecified: Secondary | ICD-10-CM

## 2022-02-28 DIAGNOSIS — Z780 Asymptomatic menopausal state: Secondary | ICD-10-CM | POA: Diagnosis not present

## 2022-02-28 DIAGNOSIS — Z0001 Encounter for general adult medical examination with abnormal findings: Secondary | ICD-10-CM | POA: Diagnosis not present

## 2022-02-28 DIAGNOSIS — Z0000 Encounter for general adult medical examination without abnormal findings: Secondary | ICD-10-CM

## 2022-02-28 DIAGNOSIS — Z86010 Personal history of colonic polyps: Secondary | ICD-10-CM | POA: Diagnosis not present

## 2022-02-28 NOTE — Progress Notes (Signed)
HPI : Heather Garrett is a very pleasant 71 year old female who I initially saw March 08, 2021 with a chief complaint of postprandial epigastric pain with weight loss and sitophobia.  Based on her symptoms and risk factors, there is high suspicion for chronic mesenteric ischemia, however ultrasound with Dopplers was not consistent with this.  She underwent an EGD and colonoscopy.  The EGD showed enlarged gastric pouch and hiatal hernia, but was otherwise unremarkable.  Colonoscopy was notable for 10 adenomatous polyps and she was recommended repeat colonoscopy in 1 year. At her follow-up in December, she reported that her abdominal pain had improved.  Her weight had stabilized.  She complained of problems with diarrhea with urgency at that time, and was recommended to start taking Metamucil and as needed Imodium.  She was noted to have elevated liver enzymes with AST predominance as well as a macrocytosis.  She admitted to excessive alcohol use but was engaged with a counselor to help curb her drinking.  An evaluation for other causes of chronic liver disease was negative.  She was noted to have hepatic steatosis on imaging, without any evidence of portal hypertension.  At her follow-up in April, she complained of both diarrhea and constipation.  The constipation had started after surgery for a hip replacement.  A plain film of the abdomen at that time showed moderate stool retention and it was thought that her diarrhea is more likely related to overflow incontinence.  She was recommended to take MiraLAX 1-2 times per day in addition to [place].  Today, she still reports ongoing issues with postprandial abdominal pain with nausea and early satiety.  The pain is not as severe as it was last year, but is still very bothersome.  Pain typically resolves within 30 minutes of eating.  It does not seem to matter what she eats, she will have the symptoms.  She has tried cutting out red meat and dairy, but did not  experience any improvement.  She denies any problems with pain or nausea if she does not eat.  Her weight has been stable. She denies reflux symptoms such as heartburn or acid regurgitation.  Her diarrhea seems to be better than it was.  She has stopped taking the MiraLAX and the Benefiber.  She has a bowel movement most days, but does have issues with urgency and incontinence.  Her stool consistency varies  Past Medical History:  Diagnosis Date   Biceps tendinitis    Bladder cancer ([place]) 2014   Breast cancer ([place]) 1990   Cobalamin deficiency    Coronary arteriosclerosis in native artery    Elevated liver function tests    Hiatal hernia    Hyperlipidemia    Migraine    Myocardial infarction (HCC)    Rotator cuff syndrome    Transitional cell carcinoma, bladder (HCC)    Vitamin D deficiency      Past Surgical History:  Procedure Laterality Date   BREAST SURGERY     CARDIAC CATHETERIZATION     CATARACT EXTRACTION     CHOLECYSTECTOMY  1979   DG  BONE DENSITY ([place] [facility])     ENDOMETRIAL ABLATION     GASTRIC BYPASS     MASTECTOMY     OTHER SURGICAL HISTORY     CANCER SURGERY   TOTAL HIP ARTHROPLASTY Left 09/06/2021   Procedure: TOTAL HIP ARTHROPLASTY;  Surgeon: Willaim Sheng, MD;  Location: [facility];  Service: Orthopedics;  Laterality: Left;   Family History  Problem  Relation Age of Onset   Congestive Heart Failure Mother    Coronary artery disease Father    Heart attack Father 49       cause of death.   Diabetes Mellitus II Sister    Hypertension Sister    Breast cancer Sister    Migraines Neg Hx    Colon cancer Neg Hx    Esophageal cancer Neg Hx    Stomach cancer Neg Hx    Social History   Tobacco Use   Smoking status: Former    Passive exposure: Never   Smokeless tobacco: Never   Tobacco comments:    Quit 30 yrs ago  Vaping Use   Vaping Use: Never used  Substance Use Topics   Alcohol use: Yes    Alcohol/week: 14.0 standard drinks of alcohol    Types:  14 Glasses of wine per week    Comment: daily, 5 glasses wine   Drug use: Never   Current Outpatient Medications  Medication Sig Dispense Refill   alendronate (FOSAMAX) 70 MG tablet Take 1 tablet (70 mg total) by mouth every 7 (seven) days. Take with a full glass of water on an empty stomach. (Patient not taking: Reported on 02/18/2022) 4 tablet 11   amLODipine (NORVASC) 2.5 MG tablet Take 1 tablet (2.5 mg total) by mouth daily. 60 tablet 0   apixaban (ELIQUIS) 5 MG TABS tablet Take 2 tablets (10 mg total) by mouth 2 (two) times daily for 2 days, THEN 1 tablet (5 mg total) 2 (two) times daily for 28 days. 64 tablet 0   aspirin 81 MG EC tablet Take 1 tablet (81 mg total) by mouth every other day. Swallow whole.  Hold aspirin while taking eliquis, (Patient taking differently: Take 81 mg by mouth daily. Swallow whole.) 30 tablet 0   carvedilol (COREG) 3.125 MG tablet Take 1 tablet (3.125 mg total) by mouth 2 (two) times daily. Hold if sbp less than 100 or heart rate less than 50     cephALEXin (KEFLEX) 500 MG capsule Take 1 capsule (500 mg total) by mouth 2 (two) times daily. (Patient not taking: Reported on 02/18/2022) 10 capsule 0   Cholecalciferol 25 MCG (1000 UT) tablet Take 1,000 Units by mouth daily.     ezetimibe-simvastatin (VYTORIN) 10-80 MG tablet Take 1 tablet by mouth at bedtime.     ferrous sulfate 325 (65 FE) MG EC tablet Take 1 tablet (325 mg total) by mouth every Monday, Wednesday, and Friday. 30 tablet 0   folic acid (FOLVITE) 1 MG tablet Take 1 tablet (1 mg total) by mouth daily.  3   hydrochlorothiazide (HYDRODIURIL) 25 MG tablet Take 25 mg by mouth daily as needed (swelling/fluid).     lidocaine (XYLOCAINE) 2 % solution Use as directed 15 mLs in the mouth or throat every 6 (six) hours as needed for mouth pain. (Patient not taking: Reported on 02/03/2022)  0   lidocaine (XYLOCAINE) 5 % ointment Apply 1 application topically as needed. (Patient not taking: Reported on 02/03/2022) 35.44  g 0   NURTEC 75 MG TBDP Take 75 mg by mouth daily as needed for headache.     oxyCODONE (OXY IR/ROXICODONE) 5 MG immediate release tablet Take 1 tablet (5 mg total) by mouth every 4 (four) hours as needed for severe pain. 15 tablet 0   pantoprazole (PROTONIX) 40 MG tablet Take 1 tablet (40 mg total) by mouth 2 (two) times daily before a meal. 60 tablet 2   polyethylene  glycol (MIRALAX / GLYCOLAX) 17 g packet Take 17 g by mouth daily. (Patient taking differently: Take 17 g by mouth daily as needed for mild constipation.) 14 each 0   Polyethylene Glycol 400 (BLINK TEARS) 0.25 % SOLN Place 1 drop into both eyes daily as needed (dry eyes).     senna-docusate (SENOKOT-S) 8.6-50 MG tablet Take 1 tablet by mouth at bedtime. 14 tablet 0   sertraline (ZOLOFT) 100 MG tablet Take 100 mg by mouth daily.     thiamine 100 MG tablet Take 1 tablet (100 mg total) by mouth daily.     tiZANidine (ZANAFLEX) 4 MG tablet Take 1 tablet (4 mg total) by mouth at bedtime. 14 tablet 0   vitamin B-12 (CYANOCOBALAMIN) 1000 MCG tablet Take 1,000 mcg by mouth daily. (Patient not taking: Reported on 02/18/2022)     Vitamin D, Ergocalciferol, (DRISDOL) 1.25 MG (50000 UNIT) CAPS capsule Take 1 capsule (50,000 Units total) by mouth every 7 (seven) days. 5 capsule 1   No current facility-administered medications for this visit.   Allergies  Allergen Reactions   Contrast Media [Iodinated Contrast Media] Anaphylaxis and Hives   Ciprofloxacin Other (See Comments)    Interaction w other medicine   Erythromycin Base Nausea And Vomiting   Sulfabenzamide Other (See Comments)    UNK reaction     Review of Systems: All systems reviewed and negative except where noted in HPI.    DG Ankle Complete Left  Result Date: 02/20/2022 CLINICAL DATA:  Ankle pain EXAM: LEFT ANKLE COMPLETE - 3+ VIEW COMPARISON:  Radiographs 11/28/2021 FINDINGS: Demineralization. No definite acute fracture. Osteoarthritis about the ankle. Soft tissue swelling  about the ankle. Vascular calcifications. IMPRESSION: No acute osseous abnormality. Electronically Signed   By: Placido Sou M.D.   On: 02/20/2022 17:50   DG Bone Density  Result Date: 02/15/2022 EXAM: DUAL X-RAY ABSORPTIOMETRY (DXA) FOR BONE MINERAL DENSITY IMPRESSION: Your patient Madgeline Rayo completed a BMD test on 02/15/2022 using the [employer] (software version: 14.10) manufactured by [employer]. The following summarizes the results of our evaluation. Technologist:[facility] PATIENT BIOGRAPHICAL: Name: Averey, [place] Patient ID: [id number] Birth Date: [birth year] Height: 56.0 in. Gender: Female Exam Date: 02/15/2022 Weight: 123.0 lbs. Indications: Caucasian, Height Loss, History of Fracture (Adult), Hx Breast Ca, Low Body Weight, Post Menopausal, Unilateral oophrectomy Fractures: Ribs Treatments: Asprin, Multivitamin DENSITOMETRY RESULTS: Site         Region     Measured Date Measured Age WHO Classification Young Adult T-score BMD         %Change vs. Previous Significant Change (*) Right Femur Neck 02/15/20[age] Osteoporosis -4.3 0.439 g/cm2 - - Right Femur Total 02/15/2022 71.0 Osteoporosis -4.7 0.419 g/cm2 - - Left Forearm Radius 33% 02/15/20[age].0 Osteoporosis -4.7 0.375 g/cm2 - - ASSESSMENT: The BMD measured at Forearm Radius 33% is 0.375 g/cm2 with a T-score of -4.7. This patient is considered osteoporotic according to [facility]) criteria. The scan quality is good. Lumbar spine was excluded due to advanced degenerative changes. Lt. hip excluded due to surgical repair. World Pharmacologist [facility]) criteria for post-menopausal, Caucasian Women: Normal:       T-score at or above -1 SD Osteopenia:   T-score between -1 and -2.5 SD Osteoporosis: T-score at or below -2.5 SD RECOMMENDATIONS: 1. All patients should optimize calcium and vitamin D intake. 2. Consider FDA-approved medical therapies in postmenopausal women and med aged 45 years and older,  based on the following:  a. A hip or vertebral (clinical or morphometric) fracture b. T-score< -2.5 at the femoral neck or spine after appropriate evaluation to exclude secondary causes c. Low bone mass (T-score between -1.0 and -2.5 at the femoral neck or spine) and a 10-year probability of a hip fracture > 3% or a 10-year probability of a major osteoporosis-related fracture > 20% based on the [place]-adapted WHO algorithm d. Clinician judgment and/or patient preferences may indicate treatment for people with 10-year fracture probabilities above or below these levels FOLLOW-UP: Patients with diagnosis of osteoporsis or at high risk for fracture should have regular bone mineral density tests. For patients eligible for Medicare, routine testing is allowed once every 2 years. The testing frequency can be increased to one year for patients who have rapidly progressing disease, those who are receiving or discontinuing medical therapy to restore bone mass, or have additional risk factors. I have reviewed this report, and agree with the above findings. [facility] Electronically Signed   By: Elmer Picker M.D.   On: 02/15/2022 13:26    Physical Exam: BP 134/80   Pulse 74   Ht '4\' 8"'$  (1.422 m)   Wt 115 lb 3.2 oz (52.3 kg)   SpO2 98%   BMI 25.83 kg/m  Constitutional: Pleasant,well-developed, Caucasian female in no acute distress.  Uses a walker for ambulation HEENT: Normocephalic and atraumatic. Conjunctivae are normal. No scleral icterus. Cardiovascular: Normal rate, regular rhythm.  Pulmonary/chest: Effort normal and breath sounds normal. No wheezing, rales or rhonchi. Abdominal: Soft, nondistended, nontender. Bowel sounds active throughout. There are no masses palpable. No hepatomegaly. Extremities: no edema Neurological: Alert and oriented to person place and time. Skin: Skin is warm and dry. No rashes noted. Psychiatric: Normal mood and affect. Behavior is normal.  CBC    Component  Value Date/Time   WBC 8.1 02/09/2022 0807   WBC 12.5 (H) 12/17/2021 0501   RBC 3.52 (L) 02/09/2022 0807   RBC 3.44 (L) 12/17/2021 0501   HGB 11.0 (L) 02/09/2022 0807   HCT 34.1 02/09/2022 0807   PLT 329 02/09/2022 0807   MCV 97 02/09/2022 0807   MCH 31.3 02/09/2022 0807   MCH 31.1 12/17/2021 0501   MCHC 32.3 02/09/2022 0807   MCHC 30.1 12/17/2021 0501   RDW 15.5 (H) 02/09/2022 0807   LYMPHSABS 2.0 02/09/2022 0807   MONOABS 1.2 (H) 12/10/2021 0546   EOSABS 0.3 02/09/2022 0807   BASOSABS 0.1 02/09/2022 0807    CMP     Component Value Date/Time   NA 142 02/09/2022 0807   K 5.7 (H) 02/09/2022 0807   CL 104 02/09/2022 0807   CO2 16 (L) 02/09/2022 0807   GLUCOSE 100 (H) 02/09/2022 0807   GLUCOSE 89 12/17/2021 0501   BUN 11 02/09/2022 0807   CREATININE 0.77 02/09/2022 0807   CALCIUM 9.2 02/09/2022 0807   PROT 6.1 (L) 12/15/2021 0441   ALBUMIN 2.3 (L) 12/15/2021 0441   AST 26 12/15/2021 0441   ALT 13 12/15/2021 0441   ALKPHOS 178 (H) 12/15/2021 0441   BILITOT 0.5 12/15/2021 0441   GFRNONAA >60 12/17/2021 0501     ASSESSMENT AND PLAN: 71 year old female with history of coronary artery disease and bladder cancer, with persistent chronic postprandial abdominal pain, nausea and early satiety.  Work-up negative for mesenteric ischemia.  EGD unremarkable.  Patient's symptoms are suggestive of gastroparesis.  Given the patient's history of gastric bypass surgery, a gastric emptying study would not be accurate.  We discussed what gastroparesis is,  and how it is managed.  Given the inability to accurately assess for this condition, and the absence of other etiologies, I suggested that we go ahead and treat her empirically for gastroparesis.  We will start with just a gastroparesis diet.  Patient was given handouts on the principles of a gastric paretic diet.  If no improvement, we can consider treatment with Reglan, although we discussed the concerns about tardive dyskinesia, and the  patient seemed to be very hesitant to try this.  Although the absence of pain without eating would be unusual for chronic pancreatitis, I do think that checking a fecal elastase in this patient with history of heavy alcohol use would be reasonable to exclude as a cause of her abdominal pain.  We will check fecal elastase. The patient's bowel habits have improved, but she continues to be bothered by fecal urge incontinence.  She did not experience improvement with motility agents or fiber supplementation.  We discussed the role of pelvic floor physical therapy improving symptoms of fecal urgency and incontinence.  Patient was willing to meet with a physical therapist try to improve her symptoms. The patient had a colonoscopy in November of last year with 10 adenomas removed.  Is recommended she undergo a repeat colonoscopy in 1 year.  We are not yet scheduling into November at this time, so we will contact her when her November schedule is open and get her scheduled. With regards to patient's alcohol use, the patient reports continued slow reductions in her alcohol intake.  Currently she has a glass of wine with dinner a couple times a week.  Post prandial abdominal pain/early satiety - Gastroparesis diet - fecal elastase  Fecal incontinence - Pelvic floor PT  Colon polyps - [facility] in Nov  Alcoholic fatty liver disease - Continue to reduce alcohol intake  Teryn Gust E. [name], MD [facility]   Aura Dials, PA-C

## 2022-02-28 NOTE — Patient Instructions (Signed)
  Heather Garrett , Thank you for taking time to come for your Medicare Wellness Visit. I appreciate your ongoing commitment to your health goals. Please review the following plan we discussed and let me know if I can assist you in the future.   These are the goals we discussed:  Goals      Patient Stated     Patient states that her goal is to walk without assistance again         This is a list of the screening recommended for you and due dates:  Health Maintenance  Topic Date Due   COVID-19 Vaccine (1) Never done   Urine Protein Check  Never done   Zoster (Shingles) Vaccine (1 of 2) Never done   Mammogram  Never done   Pneumonia Vaccine (2 - PCV) 06/21/2017   Tetanus Vaccine  07/20/2020   Flu Shot  02/08/2022   Colon Cancer Screening  05/27/2022   DEXA scan (bone density measurement)  Completed   Hepatitis C Screening: USPSTF Recommendation to screen - Ages 52-79 yo.  Completed   HPV Vaccine  Aged Out

## 2022-02-28 NOTE — Telephone Encounter (Signed)
Yes

## 2022-02-28 NOTE — Patient Instructions (Addendum)
If you are age 72 or older, your body mass index should be between 23-30. Your Body mass index is 25.83 kg/m. If this is out of the aforementioned range listed, please consider follow up with your Primary Care Provider.  If you are age 7 or younger, your body mass index should be between 19-25. Your Body mass index is 25.83 kg/m. If this is out of the aformentioned range listed, please consider follow up with your Primary Care Provider.   Your provider has requested that you go to the basement level for lab work before leaving today. Press "B" on the elevator. The lab is located at the first door on the left as you exit the elevator.  We will contact you to schedule a Colonoscopy in November.   We have sent a referral to Pelvic floor therapy they will contact you to schedule an appointment.  The [facility] providers would like to encourage you to use [facility] to communicate with providers for non-urgent requests or questions.  Due to long hold times on the telephone, sending your provider a message by [facility] may be a faster and more efficient way to get a response.  Please allow 48 business hours for a response.  Please remember that this is for non-urgent requests.   It was a pleasure to see you today!  Thank you for trusting me with your gastrointestinal care!    Scott E.[name], MD

## 2022-02-28 NOTE — Progress Notes (Signed)
Subjective:   Heather Garrett is a 71 y.o. female who presents for Medicare Annual (Subsequent) preventive examination. I connected with  [name] on 02/28/22 by a audio enabled telemedicine application and verified that I am speaking with the correct person using two identifiers.  Patient Location: Home  Provider Location: Office/Clinic  I discussed the limitations of evaluation and management by telemedicine. The patient expressed understanding and agreed to proceed.  Review of Systems           Objective:    There were no vitals filed for this visit. There is no height or weight on file to calculate BMI.     12/10/2021    8:00 AM 11/28/2021   10:56 AM 09/05/2021    8:44 AM 06/30/2021   11:19 AM 04/02/2021    8:34 AM 01/26/2021    8:25 AM  Advanced Directives  Does Patient Have a Medical Advance Directive? No No No No Yes No  Does patient want to make changes to medical advance directive?     No - Patient declined   Would patient like information on creating a medical advance directive? No - Patient declined No - Patient declined No - Patient declined No - Patient declined  No - Patient declined    Current Medications (verified) Outpatient Encounter Medications as of 02/28/2022  Medication Sig   alendronate (FOSAMAX) 70 MG tablet Take 1 tablet (70 mg total) by mouth every 7 (seven) days. Take with a full glass of water on an empty stomach. (Patient not taking: Reported on 02/18/2022)   amLODipine (NORVASC) 2.5 MG tablet Take 1 tablet (2.5 mg total) by mouth daily.   apixaban (ELIQUIS) 5 MG TABS tablet Take 2 tablets (10 mg total) by mouth 2 (two) times daily for 2 days, THEN 1 tablet (5 mg total) 2 (two) times daily for 28 days. (Patient not taking: Reported on 02/28/2022)   aspirin 81 MG EC tablet Take 1 tablet (81 mg total) by mouth every other day. Swallow whole.  Hold aspirin while taking eliquis, (Patient taking differently: Take 81 mg by mouth daily. Swallow  whole.)   carvedilol (COREG) 3.125 MG tablet Take 1 tablet (3.125 mg total) by mouth 2 (two) times daily. Hold if sbp less than 100 or heart rate less than 50   cephALEXin (KEFLEX) 500 MG capsule Take 1 capsule (500 mg total) by mouth 2 (two) times daily. (Patient not taking: Reported on 02/18/2022)   Cholecalciferol 25 MCG (1000 UT) tablet Take 1,000 Units by mouth daily.   ezetimibe-simvastatin (VYTORIN) 10-80 MG tablet Take 1 tablet by mouth at bedtime.   famotidine (PEPCID) 40 MG tablet 1 tablet as needed.   ferrous sulfate 325 (65 FE) MG EC tablet Take 1 tablet (325 mg total) by mouth every Monday, Wednesday, and Friday.   folic acid (FOLVITE) 1 MG tablet Take 1 tablet (1 mg total) by mouth daily.   hydrochlorothiazide (HYDRODIURIL) 25 MG tablet Take 25 mg by mouth daily as needed (swelling/fluid).   lidocaine (XYLOCAINE) 2 % solution Use as directed 15 mLs in the mouth or throat every 6 (six) hours as needed for mouth pain. (Patient not taking: Reported on 02/03/2022)   lidocaine (XYLOCAINE) 5 % ointment Apply 1 application topically as needed. (Patient not taking: Reported on 02/03/2022)   NURTEC 75 MG TBDP Take 75 mg by mouth daily as needed for headache.   oxyCODONE (OXY IR/ROXICODONE) 5 MG immediate release tablet Take 1 tablet (5 mg total)  by mouth every 4 (four) hours as needed for severe pain. (Patient not taking: Reported on 02/28/2022)   pantoprazole (PROTONIX) 40 MG tablet Take 1 tablet (40 mg total) by mouth 2 (two) times daily before a meal.   polyethylene glycol (MIRALAX / GLYCOLAX) 17 g packet Take 17 g by mouth daily. (Patient taking differently: Take 17 g by mouth daily as needed for mild constipation.)   Polyethylene Glycol 400 (BLINK TEARS) 0.25 % SOLN Place 1 drop into both eyes daily as needed (dry eyes).   senna-docusate (SENOKOT-S) 8.6-50 MG tablet Take 1 tablet by mouth at bedtime. (Patient not taking: Reported on 02/28/2022)   sertraline (ZOLOFT) 100 MG tablet Take 100 mg by  mouth daily.   thiamine 100 MG tablet Take 1 tablet (100 mg total) by mouth daily.   tiZANidine (ZANAFLEX) 4 MG tablet Take 1 tablet (4 mg total) by mouth at bedtime.   vitamin B-12 (CYANOCOBALAMIN) 1000 MCG tablet Take 1,000 mcg by mouth daily. (Patient not taking: Reported on 02/18/2022)   Vitamin D, Ergocalciferol, (DRISDOL) 1.25 MG (50000 UNIT) CAPS capsule Take 1 capsule (50,000 Units total) by mouth every 7 (seven) days.   No facility-administered encounter medications on file as of 02/28/2022.    Allergies (verified) Contrast media [iodinated contrast media], Ciprofloxacin, Erythromycin base, and Sulfabenzamide   History: Past Medical History:  Diagnosis Date   Biceps tendinitis    Bladder cancer ([place]) 2014   Breast cancer ([place]) 1990   Cobalamin deficiency    Coronary arteriosclerosis in native artery    Elevated liver function tests    Hiatal hernia    Hyperlipidemia    Migraine    Myocardial infarction ([place])    Rotator cuff syndrome    Transitional cell carcinoma, bladder (HCC)    Vitamin D deficiency    Past Surgical History:  Procedure Laterality Date   BREAST SURGERY     CARDIAC CATHETERIZATION     CATARACT EXTRACTION     CHOLECYSTECTOMY  1979   DG  BONE DENSITY ([place] [facility])     ENDOMETRIAL ABLATION     GASTRIC BYPASS     MASTECTOMY     OTHER SURGICAL HISTORY     CANCER SURGERY   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   Diabetes Mellitus II Sister    Hypertension Sister    Breast cancer Sister    Migraines Neg Hx    Colon cancer Neg Hx    Esophageal cancer Neg Hx    Stomach cancer Neg Hx    Social History   Socioeconomic History   Marital status: Married     Spouse name: Not on file   Number of children: Not on file   Years of education: Not on file   Highest education level: Not on file  Occupational History   Not on file  Tobacco Use   Smoking status: Former    Passive exposure: Never   Smokeless tobacco: Never   Tobacco comments:    Quit 30 yrs ago  Vaping Use   Vaping Use: Never used  Substance and Sexual Activity   Alcohol use: Yes  Alcohol/week: 14.0 standard drinks of alcohol    Types: 14 Glasses of wine per week    Comment: daily, 5 glasses wine   Drug use: Never   Sexual activity: Not on file  Other Topics Concern   Not on file  Social History Narrative   Not on file   Social Determinants of Health   Financial Resource Strain: Not on file  Food Insecurity: Not on file  Transportation Needs: Not on file  Physical Activity: Not on file  Stress: Not on file  Social Connections: Not on file    Tobacco Counseling Counseling given: Not Answered Tobacco comments: Quit 30 yrs ago   Clinical Intake:                 Diabetic?no         Activities of Daily Living    12/10/2021    8:00 AM 11/29/2021    1:00 AM  In your present state of health, do you have any difficulty performing the following activities:  Hearing? 0   Vision? 0   Difficulty concentrating or making decisions? 0   Walking or climbing stairs? 1   Dressing or bathing? 0   Doing errands, shopping? 0 0    Patient Care Team: [name], FNP as PCP - General (Family Medicine) Jerline Pain, MD as PCP - Cardiology (Cardiology) [name] as Physician Assistant (Cardiology)  Indicate any recent Medical Services you may have received from other than [facility] providers in the past year (date may be approximate).     Assessment:   This is a routine wellness examination for [facility].  Hearing/Vision screen No results found.  Dietary issues and exercise activities discussed:     Goals Addressed   None     Depression Screen    02/18/2022    2:51 PM 02/03/2022    8:25 AM  PHQ 2/9 Scores  PHQ - 2 Score 0 0    Fall Risk    02/18/2022    2:51 PM 02/03/2022    8:25 AM  [place] in the past year? 0 1  Number falls in past yr: 0 1  Injury with Fall? 0 1  Risk for fall due to : No Fall Risks History of fall(s)  Follow up Falls evaluation completed Education provided;Falls evaluation completed;Falls prevention discussed    FALL RISK PREVENTION PERTAINING TO THE HOME:  Any stairs in or around the home? Yes  If so, are there any without handrails? No  Home free of loose throw rugs in walkways, pet beds, electrical cords, etc? Yes  Adequate lighting in your home to reduce risk of falls? Yes   ASSISTIVE DEVICES UTILIZED TO PREVENT FALLS:  Life alert? No  Use of a cane, walker or w/c? Yes  Grab bars in the bathroom? Yes  Shower chair or bench in shower? Yes  Elevated toilet seat or a handicapped toilet? Yes   TIMED UP AND GO:  Was the test performed? No .  Length of time to ambulate 10 feet:  sec.     Cognitive Function:        Immunizations Immunization History  Administered Date(s) Administered   Influenza, High Dose Seasonal PF 06/20/2016   Influenza,inj,Quad PF,6+ Mos 04/11/2018   Pneumococcal Polysaccharide-23 06/21/2016   Tetanus 07/20/2010    TDAP status: Due, Education has been provided regarding the importance of this vaccine. Advised may receive this vaccine at local pharmacy or Health Dept. Aware  to provide a copy of the vaccination record if obtained from local pharmacy or Health Dept. Verbalized acceptance and understanding.  Flu Vaccine status: Up to date  Pneumococcal vaccine status: Due, Education has been provided regarding the importance of this vaccine. Advised may receive this vaccine at local pharmacy or Health Dept. Aware to provide a copy of the vaccination record if obtained from local pharmacy or Health Dept. Verbalized acceptance and  understanding.  Covid-19 vaccine status: Information provided on how to obtain vaccines.   Qualifies for Shingles Vaccine? Yes   Zostavax completed No   Shingrix Completed?: No.    Education has been provided regarding the importance of this vaccine. Patient has been advised to call insurance company to determine out of pocket expense if they have not yet received this vaccine. Advised may also receive vaccine at local pharmacy or Health Dept. Verbalized acceptance and understanding.  Screening Tests Health Maintenance  Topic Date Due   COVID-19 Vaccine (1) Never done   URINE MICROALBUMIN  Never done   Zoster Vaccines- Shingrix (1 of 2) Never done   MAMMOGRAM  Never done   Pneumonia Vaccine 52+ Years old (2 - PCV) 06/21/2017   TETANUS/TDAP  07/20/2020   INFLUENZA VACCINE  02/08/2022   COLONOSCOPY (Pts 45-87yr Insurance coverage will need to be confirmed)  05/27/2022   DEXA SCAN  Completed   Hepatitis C Screening  Completed   HPV VACCINES  Aged Out    Health Maintenance  Health Maintenance Due  Topic Date Due   COVID-19 Vaccine (1) Never done   URINE MICROALBUMIN  Never done   Zoster Vaccines- Shingrix (1 of 2) Never done   MAMMOGRAM  Never done   Pneumonia Vaccine 72 Years old (2 - PCV) 06/21/2017   TETANUS/TDAP  07/20/2020   INFLUENZA VACCINE  02/08/2022    Colorectal cancer screening: Type of screening: Colonoscopy. Completed 05/27/21. Repeat every 10 years  Mammogram status: Ordered 02/28/22. Pt provided with contact info and advised to call to schedule appt.   Bone Density status: Completed 02/15/22. Results reflect: Bone density results: OSTEOPOROSIS. Repeat every   years.  Lung Cancer Screening: (Low Dose CT Chest recommended if Age 72-80years, 30 pack-year currently smoking OR have quit w/in 15years.) does not qualify.   Lung Cancer Screening Referral:   Additional Screening:  Hepatitis C Screening: does not qualify; Completed 06/16/21  Vision Screening:  Recommended annual ophthalmology exams for early detection of glaucoma and other disorders of the eye. Is the patient up to date with their annual eye exam?  No  Who is the provider or what is the name of the office in which the patient attends annual eye exams?  If pt is not established with a provider, would they like to be referred to a provider to establish care? No .   Dental Screening: Recommended annual dental exams for proper oral hygiene  Community Resource Referral / Chronic Care Management: CRR required this visit?  No   CCM required this visit?  No      Plan:     I have personally reviewed and noted the following in the patient's chart:   Medical and social history Use of alcohol, tobacco or illicit drugs  Current medications and supplements including opioid prescriptions. Patient is not currently taking opioid prescriptions. Functional ability and status Nutritional status Physical activity Advanced directives List of other physicians Hospitalizations, surgeries, and ER visits in previous 12 months Vitals Screenings to include cognitive, depression, and falls Referrals  and appointments  In addition, I have reviewed and discussed with patient certain preventive protocols, quality metrics, and best practice recommendations. A written personalized care plan for preventive services as well as general preventive health recommendations were provided to patient.     Jill Side, [place]   02/28/2022   Nurse Notes:

## 2022-03-01 ENCOUNTER — Other Ambulatory Visit: Payer: Self-pay | Admitting: Family Medicine

## 2022-03-01 ENCOUNTER — Other Ambulatory Visit: Payer: Self-pay

## 2022-03-01 ENCOUNTER — Encounter: Payer: Self-pay | Admitting: Family Medicine

## 2022-03-01 DIAGNOSIS — M75100 Unspecified rotator cuff tear or rupture of unspecified shoulder, not specified as traumatic: Secondary | ICD-10-CM

## 2022-03-01 MED ORDER — TIZANIDINE HCL 4 MG PO TABS: 4.0000 mg | ORAL_TABLET | Freq: Every day | ORAL | 0 refills | Status: DC

## 2022-03-01 MED ORDER — FAMOTIDINE 40 MG PO TABS: 40.0000 mg | ORAL_TABLET | ORAL | 2 refills | Status: DC | PRN

## 2022-03-01 NOTE — Telephone Encounter (Signed)
Refill sent.

## 2022-03-02 ENCOUNTER — Ambulatory Visit
Admission: EM | Admit: 2022-03-02 | Discharge: 2022-03-02 | Disposition: A | Discharge status: Home / Self Care | Payer: Medicare PPO | Attending: Nurse Practitioner | Admitting: Nurse Practitioner

## 2022-03-02 ENCOUNTER — Ambulatory Visit (INDEPENDENT_AMBULATORY_CARE_PROVIDER_SITE_OTHER): Payer: Medicare PPO

## 2022-03-02 ENCOUNTER — Ambulatory Visit: Payer: Medicare PPO | Admitting: Physical Therapy

## 2022-03-02 DIAGNOSIS — M25552 Pain in left hip: Secondary | ICD-10-CM

## 2022-03-02 DIAGNOSIS — W19XXXA Unspecified fall, initial encounter: Secondary | ICD-10-CM

## 2022-03-02 DIAGNOSIS — Z471 Aftercare following joint replacement surgery: Secondary | ICD-10-CM | POA: Diagnosis not present

## 2022-03-02 DIAGNOSIS — Z96642 Presence of left artificial hip joint: Secondary | ICD-10-CM | POA: Diagnosis not present

## 2022-03-02 NOTE — ED Notes (Signed)
Pt hit left forearm during xray and small skin tear noted. Bleeding controlled. Site cleaned with Hibiclens, Telfa applied, secured with coban.

## 2022-03-02 NOTE — ED Triage Notes (Signed)
Pt reports hip pain and left sided of head x 1 day. Reports she fell when she was using her rollator. Pt think she fell as she had 5 glass of wine and tizanide before bed. Pt wants to make sure her hip is ok, as she had a hip replacement 6 months ago.

## 2022-03-02 NOTE — Discharge Instructions (Addendum)
Based on the x-ray, there is no dislocation of the prosthesis of the left hip. Continue taking over-the-counter Tylenol arthritis strength for pain or discomfort. Recommend the use of ice or heat to the left hip.  Apply ice for pain or swelling, heat for spasm or stiffness.  Apply for 20 minutes, remove for 1 hour, then repeat. Total stretching and range of motion exercises while symptoms persist. As discussed, if your symptoms do not improve, please follow-up with the surgeon who performed the hip replacement or with orthopedics. Follow-up as needed.

## 2022-03-02 NOTE — ED Provider Notes (Addendum)
[facility]    CSN: [id number] Arrival date & time: 03/02/22  5284      History   Chief Complaint Chief Complaint  Patient presents with   Fall   Hip Pain    HPI Heather Garrett is a 71 y.o. female.   The history is provided by the patient.   Patient presents via wheelchair for complaints of left hip pain after a fall last evening.  Patient states that she drank about 5 glasses of wine and then took a muscle relaxer, tizanidine.  Patient states that she fell after she was getting up to go to bed.  Patient complains of left hip pain that radiates into the groin.  Patient states that she did have a left hip replacement approximately 6 months ago and is concerned that the hip is "out of place".  She also states that she has some numbness and tingling in the area.  She is currently in a wheelchair, states that she has been unable to walk since the fall.  She is able to stand.  States that she normally takes Tylenol, but has not taken any medication for her symptoms.  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[place])    Vitamin D deficiency     Patient Active Problem List   Diagnosis Date Noted   Left ankle pain 02/20/2022   Acute respiratory failure with hypoxia ([place]) 12/10/2021   Severe sepsis ([place]) 12/10/2021   Multifocal pneumonia 12/10/2021   GERD (gastroesophageal reflux disease) 12/10/2021   Depression 12/10/2021   Chronic diastolic CHF (congestive heart failure) ([place]) 12/10/2021   Chronic anemia 12/10/2021   UTI (urinary tract infection) 11/28/2021   Prolonged QT interval 11/28/2021   Normocytic anemia 09/05/2021   Hyperkalemia  09/05/2021   Triggering of finger 05/17/2021   Heart disease 05/17/2021   Malignant tumor of breast ([place]) 05/17/2021   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[place]) 05/17/2021   Hypertensive disorder 05/17/2021   Lower extremity edema 05/14/2021   Benign paroxysmal positional vertigo of left ear 03/23/2021   Daytime somnolence 03/23/2021   History of sleep apnea 03/23/2021   Orthostatic hypotension 03/23/2021   Migraine without aura and without status migrainosus, not intractable 03/23/2021   Coronary arteriosclerosis in native artery 09/28/2016   Elevated LFTs 09/28/2016   Rotator cuff syndrome 09/28/2016   Biceps tendinitis 09/28/2016   Cobalamin deficiency 09/28/2016   Hyperlipidemia 09/28/2016   Migraine 09/28/2016   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[place]) 09/28/2016   Vitamin D deficiency 09/28/2016    Past Surgical History:  Procedure Laterality Date   BREAST SURGERY     CARDIAC CATHETERIZATION     CATARACT EXTRACTION     CHOLECYSTECTOMY  1979   DG  BONE DENSITY ([place] [facility])     ENDOMETRIAL ABLATION     GASTRIC BYPASS     MASTECTOMY     OTHER SURGICAL HISTORY     CANCER SURGERY   TOTAL HIP ARTHROPLASTY Left 09/06/2021   Procedure: TOTAL HIP ARTHROPLASTY;  Surgeon: Willaim Sheng, MD;  Location: [facility];  Service: Orthopedics;  Laterality: Left;  OB History     Gravida  2   Para  2   Term  2   Preterm  0   AB  0   Living  2      SAB  0   IAB  0   Ectopic  0   Multiple  0   Live Births  2            Home Medications    Prior to Admission medications   Medication Sig Start Date End Date Taking? Authorizing Provider  alendronate (FOSAMAX) 70 MG tablet Take 1 tablet (70 mg total) by mouth every 7 (seven) days. Take with a full glass of water on an empty stomach. Patient not taking: Reported on 02/18/2022 02/17/22   [name], FNP  amLODipine (NORVASC) 2.5 MG tablet Take 1 tablet (2.5 mg total) by mouth daily. 02/25/22   [place],  Dewaine Conger, FNP  apixaban (ELIQUIS) 5 MG TABS tablet Take 2 tablets (10 mg total) by mouth 2 (two) times daily for 2 days, THEN 1 tablet (5 mg total) 2 (two) times daily for 28 days. Patient not taking: Reported on 02/28/2022 12/18/21 01/17/22  Damita Lack, MD  aspirin 81 MG EC tablet Take 1 tablet (81 mg total) by mouth every other day. Swallow whole.  Hold aspirin while taking eliquis, Patient taking differently: Take 81 mg by mouth daily. Swallow whole. 09/10/21   Florencia Reasons, MD  carvedilol (COREG) 3.125 MG tablet Take 1 tablet (3.125 mg total) by mouth 2 (two) times daily. Hold if sbp less than 100 or heart rate less than 50 12/03/21   Florencia Reasons, MD  Cholecalciferol 25 MCG (1000 UT) tablet Take 1,000 Units by mouth daily.    [provider]  ezetimibe-simvastatin (VYTORIN) 10-80 MG tablet Take 1 tablet by mouth at bedtime.    [provider]  famotidine (PEPCID) 40 MG tablet Take 1 tablet (40 mg total) by mouth as needed. 03/01/22   [name], FNP  ferrous sulfate 325 (65 FE) MG EC tablet Take 1 tablet (325 mg total) by mouth every Monday, Wednesday, and Friday. 09/10/21 12/09/21  Florencia Reasons, MD  folic acid (FOLVITE) 1 MG tablet Take 1 tablet (1 mg total) by mouth daily. 12/03/21 12/03/22  Florencia Reasons, MD  hydrochlorothiazide (HYDRODIURIL) 25 MG tablet Take 25 mg by mouth daily as needed (swelling/fluid).    [provider]  NURTEC 75 MG TBDP Take 75 mg by mouth daily as needed for headache. 05/19/21   [provider]  oxyCODONE (OXY IR/ROXICODONE) 5 MG immediate release tablet Take 1 tablet (5 mg total) by mouth every 4 (four) hours as needed for severe pain. Patient not taking: Reported on 02/28/2022 12/16/21   Damita Lack, MD  pantoprazole (PROTONIX) 40 MG tablet Take 1 tablet (40 mg total) by mouth 2 (two) times daily before a meal. 11/01/21   Vladimir Crofts, PA-C  polyethylene glycol (MIRALAX / GLYCOLAX) 17 g packet Take 17 g by mouth daily. Patient taking  differently: Take 17 g by mouth daily as needed for mild constipation. 09/11/21   Florencia Reasons, MD  Polyethylene Glycol 400 (BLINK TEARS) 0.25 % SOLN Place 1 drop into both eyes daily as needed (dry eyes).    [provider]  senna-docusate (SENOKOT-S) 8.6-50 MG tablet Take 1 tablet by mouth at bedtime. Patient not taking: Reported on 02/28/2022 09/10/21   Florencia Reasons, MD  sertraline (ZOLOFT) 100 MG tablet Take 100 mg by  mouth daily.    [provider]  thiamine 100 MG tablet Take 1 tablet (100 mg total) by mouth daily. 12/03/21   Florencia Reasons, MD  tiZANidine (ZANAFLEX) 4 MG tablet Take 1 tablet (4 mg total) by mouth at bedtime. 03/01/22   [name], FNP  vitamin B-12 (CYANOCOBALAMIN) 1000 MCG tablet Take 1,000 mcg by mouth daily. Patient not taking: Reported on 02/18/2022    [provider]  Vitamin D, Ergocalciferol, (DRISDOL) 1.25 MG (50000 UNIT) CAPS capsule Take 1 capsule (50,000 Units total) by mouth every 7 (seven) days. 02/17/22   [name], FNP    Family History Family History  Problem Relation Age of Onset   Congestive Heart Failure Mother    Coronary artery disease Father    Heart attack Father 84       cause of death.   Diabetes Mellitus II Sister    Hypertension Sister    Breast cancer Sister    Migraines Neg Hx    Colon cancer Neg Hx    Esophageal cancer Neg Hx    Stomach cancer Neg Hx     Social History Social History   Tobacco Use   Smoking status: Former    Passive exposure: Never   Smokeless tobacco: Never   Tobacco comments:    Quit 30 yrs ago  Vaping Use   Vaping Use: Never used  Substance Use Topics   Alcohol use: Yes    Alcohol/week: 14.0 standard drinks of alcohol    Types: 14 Glasses of wine per week    Comment: daily, 5 glasses wine   Drug use: Never     Allergies   Contrast media [iodinated contrast media], Ciprofloxacin, Erythromycin base, and Sulfabenzamide   Review of Systems Review of Systems Per HPI  Physical  Exam Triage Vital Signs ED Triage Vitals  Enc Vitals Group     BP 03/02/22 0844 104/68     Pulse Rate 03/02/22 0844 77     Resp 03/02/22 0844 16     Temp 03/02/22 0844 98 F (36.7 C)     Temp Source 03/02/22 0844 Oral     SpO2 03/02/22 0844 98 %     Weight --      Height --      Head Circumference --      Peak Flow --      Pain Score 03/02/22 0850 9     Pain Loc --      Pain Edu? --      Excl. in [place]? --    No data found.  Updated Vital Signs BP 104/68 (BP Location: Left Arm)   Pulse 77   Temp 98 F (36.7 C) (Oral)   Resp 16   SpO2 98%   Visual Acuity Right Eye Distance:   Left Eye Distance:   Bilateral Distance:    Right Eye Near:   Left Eye Near:    Bilateral Near:     Physical Exam Vitals and nursing note reviewed.  Constitutional:      Appearance: Normal appearance. She is not toxic-appearing.  HENT:     Head: Normocephalic.  Cardiovascular:     Rate and Rhythm: Normal rate and regular rhythm.     Pulses: Normal pulses.     Heart sounds: Normal heart sounds.  Pulmonary:     Effort: Pulmonary effort is normal.     Breath sounds: Normal breath sounds.  Abdominal:     General: Bowel sounds are normal.  Palpations: Abdomen is soft.  Musculoskeletal:     Left hip: Tenderness (greater trochanter) present. No deformity. Decreased range of motion. Decreased strength.  Skin:    General: Skin is warm and dry.  Neurological:     General: No focal deficit present.     Mental Status: She is alert and oriented to person, place, and time.  Psychiatric:        Mood and Affect: Mood normal.        Behavior: Behavior normal.      UC Treatments / Results  Labs (all labs ordered are listed, but only abnormal results are displayed) Labs Reviewed - No data to display  EKG   Radiology DG Hip Unilat With Pelvis 2-3 Views Left  Result Date: 03/02/2022 CLINICAL DATA:  Hip pain post fall EXAM: DG HIP (WITH OR WITHOUT PELVIS) 2-3V LEFT COMPARISON:  11/01/2021  FINDINGS: Components of left hip arthroplasty project in expected location. No fracture or dislocation. Chronic corticated fracture fragment from the greater trochanter stable. Old fracture deformity of left pubic bone. No acute fracture. Extensive iliofemoral arterial calcifications. Spondylitic changes in the visualized lower lumbar spine. IMPRESSION: No acute findings. Postop and chronic changes as above. Electronically Signed   By: Lucrezia Europe M.D.   On: 03/02/2022 09:30    Procedures Procedures (including critical care time)  Medications Ordered in UC Medications - No data to display  Initial Impression / Assessment and Plan / UC Course  I have reviewed the triage vital signs and the nursing notes.  Pertinent labs & imaging results that were available during my care of the patient were reviewed by me and considered in my medical decision making (see chart for details).  Patient presents for complaints of left hip pain after fall last evening.  On exam, patient has tenderness to the greater trochanter.  She is able to stand during the exam.  Patient presents in a wheelchair.  There is no obvious deformity or ecchymosis present.  X-rays are negative for any acute findings, patient does have a history of a left hip replacement.  X-ray was compared to x-ray performed of the left hip on 11/01/2021.  X-ray confirms there is no dislocation of the prosthesis of the left hip.  Supportive care recommendations were provided to the patient, to include avoiding alcohol while taking muscle relaxers as her injuries could be worse the next time if she were to fall..  Patient was advised that if her symptoms continue to persist or if they worsen, recommend following up with orthopedics for further evaluation.  Patient verbalizes understanding.  All questions were answered.  Final Clinical Impressions(s) / UC Diagnoses   Final diagnoses:  Fall, initial encounter  Left hip pain     Discharge Instructions       Based on the x-ray, there is no dislocation of the prosthesis of the left hip. Continue taking over-the-counter Tylenol arthritis strength for pain or discomfort. Recommend the use of ice or heat to the left hip.  Apply ice for pain or swelling, heat for spasm or stiffness.  Apply for 20 minutes, remove for 1 hour, then repeat. Total stretching and range of motion exercises while symptoms persist. As discussed, if your symptoms do not improve, please follow-up with the surgeon who performed the hip replacement or with orthopedics. Follow-up as needed.     ED Prescriptions   None    PDMP not reviewed this encounter.   Tish Men, NP 03/02/22 [phone]  Tish Men, NP 03/02/22 [phone]

## 2022-03-04 ENCOUNTER — Telehealth: Payer: Self-pay | Admitting: Gastroenterology

## 2022-03-04 NOTE — Telephone Encounter (Signed)
Discussed with pt that her stool test result is not back yet.

## 2022-03-04 NOTE — Telephone Encounter (Signed)
Inbound call from patient inquiring about test results. Please give a call back to further advise.  Thank you

## 2022-03-07 ENCOUNTER — Other Ambulatory Visit: Payer: Self-pay | Admitting: Orthopedic Surgery

## 2022-03-07 DIAGNOSIS — S72002A Fracture of unspecified part of neck of left femur, initial encounter for closed fracture: Secondary | ICD-10-CM

## 2022-03-07 DIAGNOSIS — S72002D Fracture of unspecified part of neck of left femur, subsequent encounter for closed fracture with routine healing: Secondary | ICD-10-CM | POA: Diagnosis not present

## 2022-03-08 ENCOUNTER — Ambulatory Visit: Payer: Medicare PPO | Admitting: Orthopedic Surgery

## 2022-03-08 LAB — PANCREATIC ELASTASE, FECAL: Pancreatic Elastase-1, Stool: 277 mcg/g

## 2022-03-10 ENCOUNTER — Telehealth: Payer: Self-pay

## 2022-03-10 ENCOUNTER — Other Ambulatory Visit: Payer: Self-pay | Admitting: Family Medicine

## 2022-03-10 ENCOUNTER — Telehealth: Payer: Self-pay | Admitting: Family Medicine

## 2022-03-10 ENCOUNTER — Other Ambulatory Visit: Payer: Self-pay

## 2022-03-10 MED ORDER — SERTRALINE HCL 100 MG PO TABS
100.0000 mg | ORAL_TABLET | Freq: Every day | ORAL | 3 refills | Status: DC
  Filled 2022-03-10: qty 30, 30d supply, fill #0

## 2022-03-10 NOTE — Telephone Encounter (Signed)
Patient needs refills to [facility]    sertraline (ZOLOFT) 100 MG tablet   ezetimibe-simvastatin (VYTORIN) 10-80 MG tablet

## 2022-03-10 NOTE — Telephone Encounter (Signed)
Patient called needs approval ezetimibe-simvastatin (VYTORIN) 10-80 MG tablet   Pharmacy: [facility]

## 2022-03-11 ENCOUNTER — Other Ambulatory Visit: Payer: Self-pay

## 2022-03-11 MED ORDER — EZETIMIBE-SIMVASTATIN 10-80 MG PO TABS: 1.0000 | ORAL_TABLET | Freq: Every day | ORAL | 1 refills | Status: DC

## 2022-03-11 MED ORDER — SERTRALINE HCL 100 MG PO TABS: 100.0000 mg | ORAL_TABLET | Freq: Every day | ORAL | 3 refills | Status: DC

## 2022-03-11 NOTE — Telephone Encounter (Signed)
Rx sent to [facility].

## 2022-03-11 NOTE — Telephone Encounter (Signed)
Rx sent to [facility]

## 2022-03-16 ENCOUNTER — Telehealth: Payer: Self-pay

## 2022-03-16 NOTE — Telephone Encounter (Signed)
Thanks so much. 

## 2022-03-16 NOTE — Progress Notes (Signed)
Heather Garrett,  Your fecal elastase level was normal, indicating that you do not have pancreatic insufficiency.  Please continue the dietary changes discussed and then follow up with me in the clinic as needed.  You will be due for repeat colonoscopy in November.  You will need to hold your Eliquis for one day prior to your colonoscopy.  Vaughan Basta, can you help Ms. Mabie get scheduled for a colonoscopy in November?  Will need to hold Eliquis 1 day.

## 2022-03-16 NOTE — Telephone Encounter (Signed)
Daryel November, MD  Ms. Mcnall,  Your fecal elastase level was normal, indicating that you do not have pancreatic insufficiency.  Please continue the dietary changes discussed and then follow up with me in the clinic as needed.   You will be due for repeat colonoscopy in November.  You will need to hold your Eliquis for one day prior to your colonoscopy.   Can you help Ms. Obriant get scheduled for a colonoscopy in November?  Will need to hold Eliquis 1 day

## 2022-03-16 NOTE — Telephone Encounter (Signed)
Awaiting November colonoscopy scheduled to be released. Will call patient as soon as calender is released.

## 2022-03-17 ENCOUNTER — Ambulatory Visit: Payer: Medicare PPO | Admitting: Nurse Practitioner

## 2022-03-17 ENCOUNTER — Encounter: Payer: Self-pay | Admitting: Nurse Practitioner

## 2022-03-17 ENCOUNTER — Ambulatory Visit
Admission: RE | Admit: 2022-03-17 | Discharge: 2022-03-17 | Disposition: A | Discharge status: Home / Self Care | Payer: Medicare PPO | Source: Ambulatory Visit | Attending: Orthopedic Surgery | Admitting: Orthopedic Surgery

## 2022-03-17 DIAGNOSIS — M25552 Pain in left hip: Secondary | ICD-10-CM | POA: Diagnosis not present

## 2022-03-17 DIAGNOSIS — R112 Nausea with vomiting, unspecified: Secondary | ICD-10-CM

## 2022-03-17 DIAGNOSIS — S72002A Fracture of unspecified part of neck of left femur, initial encounter for closed fracture: Secondary | ICD-10-CM

## 2022-03-17 MED ORDER — TRIMETHOBENZAMIDE HCL 300 MG PO CAPS: 300.0000 mg | ORAL_CAPSULE | Freq: Three times a day (TID) | ORAL | 0 refills | Status: DC

## 2022-03-17 NOTE — Assessment & Plan Note (Signed)
Nausea of unknown etiology Has history of prolonged QTc Rx Tigan 300 mg 3 times daily Patient encouraged to eat bland diet drink smaller amounts of water and drinks to help decrease nausea Call the office if her symptoms does not improve in 5 days

## 2022-03-17 NOTE — Progress Notes (Signed)
Virtual Visit via Telephone Note  I connected with Heather Garrett @ on 03/17/22 at  9:40 AM EDT by telephone and verified that I am speaking with the correct person using two identifiers.  I spent 8 minutes on this telephone encounter  Location: Patient:home Provider: office   I discussed the limitations, risks, security and privacy concerns of performing an evaluation and management service by telephone and the availability of in person appointments. I also discussed with the patient that there may be a patient responsible charge related to this service. The patient expressed understanding and agreed to proceed.   History of Present Illness: [name] with past medical history of migraine, CHF, malignant tumor of urinary bladder, malignant tumor of breast, hypokalemia, prolonged QT interval presents with complaints of nausea since 2 days ago,Had one yellowish colored  emesis yesterday.  Patient stated that she used to have nausea in the past when she was on chemotherapy .  Patient denies diarhea, HA, constipation,abdominal pain , fever, chills. she has not been sleeping well due to her nausea. She has not tried any medication for her symptoms    Observations/Objective:   Assessment and Plan: Nausea and vomiting Nausea of unknown etiology Has history of prolonged QTc Rx Tigan 300 mg 3 times daily Patient encouraged to eat bland diet drink smaller amounts of water and drinks to help decrease nausea Call the office if her symptoms does not improve in 5 days   Follow Up Instructions:    I discussed the assessment and treatment plan with the patient. The patient was provided an opportunity to ask questions and all were answered. The patient agreed with the plan and demonstrated an understanding of the instructions.   The patient was advised to call back or seek an in-person evaluation if the symptoms worsen or if the condition fails to improve as anticipated.

## 2022-03-18 ENCOUNTER — Other Ambulatory Visit: Payer: Self-pay | Admitting: Family Medicine

## 2022-03-18 DIAGNOSIS — M75100 Unspecified rotator cuff tear or rupture of unspecified shoulder, not specified as traumatic: Secondary | ICD-10-CM

## 2022-03-21 ENCOUNTER — Encounter: Payer: Self-pay | Admitting: Family Medicine

## 2022-03-21 ENCOUNTER — Ambulatory Visit: Payer: Medicare PPO | Admitting: Family Medicine

## 2022-03-21 VITALS — BP 124/76 | HR 87 | Ht <= 58 in | Wt 114.0 lb

## 2022-03-21 DIAGNOSIS — M75100 Unspecified rotator cuff tear or rupture of unspecified shoulder, not specified as traumatic: Secondary | ICD-10-CM | POA: Diagnosis not present

## 2022-03-21 DIAGNOSIS — R1013 Epigastric pain: Secondary | ICD-10-CM | POA: Diagnosis not present

## 2022-03-21 DIAGNOSIS — I10 Essential (primary) hypertension: Secondary | ICD-10-CM | POA: Diagnosis not present

## 2022-03-21 DIAGNOSIS — Z23 Encounter for immunization: Secondary | ICD-10-CM | POA: Diagnosis not present

## 2022-03-21 DIAGNOSIS — E7849 Other hyperlipidemia: Secondary | ICD-10-CM | POA: Diagnosis not present

## 2022-03-21 MED ORDER — EZETIMIBE-SIMVASTATIN 10-80 MG PO TABS: 1.0000 | ORAL_TABLET | Freq: Every day | ORAL | 2 refills | Status: DC

## 2022-03-21 MED ORDER — TIZANIDINE HCL 4 MG PO TABS: 4.0000 mg | ORAL_TABLET | Freq: Every day | ORAL | 2 refills | Status: DC

## 2022-03-21 MED ORDER — PANTOPRAZOLE SODIUM 40 MG PO TBEC: 40.0000 mg | DELAYED_RELEASE_TABLET | Freq: Two times a day (BID) | ORAL | 2 refills | Status: DC

## 2022-03-21 MED ORDER — HYDROCHLOROTHIAZIDE 25 MG PO TABS: 25.0000 mg | ORAL_TABLET | Freq: Every day | ORAL | 2 refills | Status: DC | PRN

## 2022-03-21 MED ORDER — AMLODIPINE BESYLATE 2.5 MG PO TABS: 2.5000 mg | ORAL_TABLET | Freq: Every day | ORAL | 2 refills | Status: DC

## 2022-03-21 NOTE — Progress Notes (Signed)
Established Patient Office Visit  Subjective:  Patient ID: Heather Garrett, female    DOB: Apr 27, 1951  Age: 71 y.o. MRN: [id number]  CC:  Chief Complaint  Patient presents with   Follow-up    Following up for bp, pt has readings she has been jotting down at home, readings fluctuate from 167/104 and lowest 128/73. Bp read 124/76 at check in.     HPI Heather Garrett is a 71 y.o. female with past medical history of migraines headaches, hypertensive disorder presents for f/u of  chronic medical conditions. HTN: BP was controlled today. Complaint with the treatment regimen. Notes to checking her BP at home with fluctuation in BP readings. She notes having systolic readings in the 035D and diastolic in the 97C. She denies visual changes and dizziness.      Past Medical History:  Diagnosis Date   Biceps tendinitis    Bladder cancer ([place]) 2014   Breast cancer ([place]) 1990   Cobalamin deficiency    Coronary arteriosclerosis in native artery    Elevated liver function tests    Hiatal hernia    Hyperlipidemia    Migraine    Myocardial infarction (H[facility])    Rotator cuff syndrome    Transitional cell carcinoma, bladder (HCC)    Vitamin D deficiency     Past Surgical History:  Procedure Laterality Date   BREAST SURGERY     CARDIAC CATHETERIZATION     CATARACT EXTRACTION     CHOLECYSTECTOMY  1979   DG  BONE DENSITY ([place] [facility]X)     ENDOMETRIAL ABLATION     GASTRIC BYPASS     MASTECTOMY     OTHER SURGICAL HISTORY     CANCER SURGERY   TOTAL HIP ARTHROPLASTY Left 09/06/2021   Procedure: TOTAL HIP ARTHROPLASTY;  Surgeon: Willaim Sheng, MD;  Location: [facility];  Service: Orthopedics;  Laterality: Left;    Family History  Problem Relation Age of Onset   Congestive Heart Failure Mother    Coronary artery disease Father    Heart attack Father 28       cause of death.   Diabetes Mellitus II Sister    Hypertension Sister    Breast cancer Sister    Migraines Neg Hx    Colon  cancer Neg Hx    Esophageal cancer Neg Hx    Stomach cancer Neg Hx     Social History   Socioeconomic History   Marital status: Married    Spouse name: Not on file   Number of children: Not on file   Years of education: Not on file   Highest education level: Not on file  Occupational History   Not on file  Tobacco Use   Smoking status: Former    Passive exposure: Never   Smokeless tobacco: Never   Tobacco comments:    Quit 30 yrs ago  Vaping Use   Vaping Use: Never used  Substance and Sexual Activity   Alcohol use: Yes    Alcohol/week: 14.0 standard drinks of alcohol    Types: 14 Glasses of wine per week    Comment: daily, 5 glasses wine   Drug use: Never   Sexual activity: Not on file  Other Topics Concern   Not on file  Social History Narrative   Not on file   Social Determinants of Health   Financial Resource Strain: Not on file  Food Insecurity: Not on file  Transportation Needs: Not on file  Physical Activity:  Not on file  Stress: Not on file  Social Connections: Not on file  Intimate Partner Violence: Not on file    Outpatient Medications Prior to Visit  Medication Sig Dispense Refill   aspirin 81 MG EC tablet Take 1 tablet (81 mg total) by mouth every other day. Swallow whole.  Hold aspirin while taking eliquis, (Patient taking differently: Take 81 mg by mouth daily. Swallow whole.) 30 tablet 0   carvedilol (COREG) 3.125 MG tablet Take 1 tablet (3.125 mg total) by mouth 2 (two) times daily. Hold if sbp less than 100 or heart rate less than 50     Cholecalciferol 25 MCG (1000 UT) tablet Take 1,000 Units by mouth daily.     famotidine (PEPCID) 40 MG tablet Take 1 tablet (40 mg total) by mouth as needed. 30 tablet 2   folic acid (FOLVITE) 1 MG tablet Take 1 tablet (1 mg total) by mouth daily.  3   NURTEC 75 MG TBDP Take 75 mg by mouth daily as needed for headache.     polyethylene glycol (MIRALAX / GLYCOLAX) 17 g packet Take 17 g by mouth daily. (Patient  taking differently: Take 17 g by mouth daily as needed for mild constipation.) 14 each 0   Polyethylene Glycol 400 (BLINK TEARS) 0.25 % SOLN Place 1 drop into both eyes daily as needed (dry eyes).     senna-docusate (SENOKOT-S) 8.6-50 MG tablet Take 1 tablet by mouth at bedtime. 14 tablet 0   sertraline (ZOLOFT) 100 MG tablet Take 1 tablet (100 mg total) by mouth daily. 30 tablet 3   thiamine 100 MG tablet Take 1 tablet (100 mg total) by mouth daily.     traMADol (ULTRAM) 50 MG tablet Take 50 mg by mouth every 8 (eight) hours as needed.     trimethobenzamide (TIGAN) 300 MG capsule Take 1 capsule (300 mg total) by mouth 3 (three) times daily. 15 capsule 0   vitamin B-12 (CYANOCOBALAMIN) 1000 MCG tablet Take 1,000 mcg by mouth daily.     Vitamin D, Ergocalciferol, (DRISDOL) 1.25 MG (50000 UNIT) CAPS capsule Take 1 capsule (50,000 Units total) by mouth every 7 (seven) days. 5 capsule 1   alendronate (FOSAMAX) 70 MG tablet Take 1 tablet (70 mg total) by mouth every 7 (seven) days. Take with a full glass of water on an empty stomach. 4 tablet 11   amLODipine (NORVASC) 2.5 MG tablet Take 1 tablet (2.5 mg total) by mouth daily. 60 tablet 0   ezetimibe-simvastatin (VYTORIN) 10-80 MG tablet Take 1 tablet by mouth at bedtime. 30 tablet 1   hydrochlorothiazide (HYDRODIURIL) 25 MG tablet Take 25 mg by mouth daily as needed (swelling/fluid).     pantoprazole (PROTONIX) 40 MG tablet Take 1 tablet (40 mg total) by mouth 2 (two) times daily before a meal. 60 tablet 2   tiZANidine (ZANAFLEX) 4 MG tablet Take 1 tablet (4 mg total) by mouth at bedtime. 14 tablet 0   ferrous sulfate 325 (65 FE) MG EC tablet Take 1 tablet (325 mg total) by mouth every Monday, Wednesday, and Friday. 30 tablet 0   No facility-administered medications prior to visit.    Allergies  Allergen Reactions   Contrast Media [Iodinated Contrast Media] Anaphylaxis and Hives   Ciprofloxacin Other (See Comments)    Interaction w other medicine    Erythromycin Base Nausea And Vomiting   Sulfabenzamide Other (See Comments)    UNK reaction    ROS Review of Systems  Constitutional:  Negative for fatigue and fever.  Eyes:  Negative for visual disturbance.  Respiratory:  Negative for chest tightness.   Cardiovascular:  Negative for chest pain.  Neurological:  Negative for dizziness and headaches.      Objective:    Physical Exam Cardiovascular:     Rate and Rhythm: Normal rate and regular rhythm.     Pulses: Normal pulses.     Heart sounds: Normal heart sounds.  Pulmonary:     Effort: Pulmonary effort is normal.     Breath sounds: Normal breath sounds.     BP 124/76   Pulse 87   Ht _0  (1.422 m)   Wt 114 lb (51.7 kg)   SpO2 95%   BMI 25.56 kg/m  Wt Readings from Last 3 Encounters:  03/21/22 114 lb (51.7 kg)  02/28/22 115 lb 3.2 oz (52.3 kg)  02/18/22 177 lb 0.6 oz (80.3 kg)    Lab Results  Component Value Date   TSH 2.570 02/09/2022   Lab Results  Component Value Date   WBC 8.1 02/09/2022   HGB 11.0 (L) 02/09/2022   HCT 34.1 02/09/2022   MCV 97 02/09/2022   PLT 329 02/09/2022   Lab Results  Component Value Date   NA 142 02/09/2022   K 5.7 (H) 02/09/2022   CO2 16 (L) 02/09/2022   GLUCOSE 100 (H) 02/09/2022   BUN 11 02/09/2022   CREATININE 0.77 02/09/2022   BILITOT 0.5 12/15/2021   ALKPHOS 178 (H) 12/15/2021   AST 26 12/15/2021   ALT 13 12/15/2021   PROT 6.1 (L) 12/15/2021   ALBUMIN 2.3 (L) 12/15/2021   CALCIUM 9.2 02/09/2022   ANIONGAP 8 12/17/2021   EGFR 82 02/09/2022   GFR 72.00 06/16/2021   Lab Results  Component Value Date   CHOL 221 (H) 02/09/2022   Lab Results  Component Value Date   HDL 155 02/09/2022   Lab Results  Component Value Date   LDLCALC 52 02/09/2022   Lab Results  Component Value Date   TRIG 86 02/09/2022   Lab Results  Component Value Date   CHOLHDL 1.4 02/09/2022   Lab Results  Component Value Date   HGBA1C 5.1 02/09/2022      Assessment &  Plan:   Problem List Items Addressed This Visit       Cardiovascular and Mediastinum   Hypertensive disorder - Primary    Controlled Encouraged pt to return to the clinic in 2 weeks and  bring her home BP monitoring machine I would like to see if there is a discrepancy in her monitor compared to a manual reading in the clinic       Relevant Medications   ezetimibe-simvastatin (VYTORIN) 10-80 MG tablet   amLODipine (NORVASC) 2.5 MG tablet   hydrochlorothiazide (HYDRODIURIL) 25 MG tablet   Other Relevant Orders   Microalbumin / creatinine urine ratio     Musculoskeletal and Integument   Rotator cuff syndrome   Relevant Medications   tiZANidine (ZANAFLEX) 4 MG tablet     Other   Hyperlipidemia   Relevant Medications   ezetimibe-simvastatin (VYTORIN) 10-80 MG tablet   amLODipine (NORVASC) 2.5 MG tablet   hydrochlorothiazide (HYDRODIURIL) 25 MG tablet   Other Visit Diagnoses     Epigastric pain       Relevant Medications   pantoprazole (PROTONIX) 40 MG tablet   Immunization due       Relevant Orders   Flu Vaccine QUAD High Dose(Fluad) (Completed)   Pneumococcal conjugate  vaccine 20-valent (Completed)       Meds ordered this encounter  Medications   ezetimibe-simvastatin (VYTORIN) 10-80 MG tablet    Sig: Take 1 tablet by mouth at bedtime.    Dispense:  30 tablet    Refill:  2   amLODipine (NORVASC) 2.5 MG tablet    Sig: Take 1 tablet (2.5 mg total) by mouth daily.    Dispense:  60 tablet    Refill:  2   hydrochlorothiazide (HYDRODIURIL) 25 MG tablet    Sig: Take 1 tablet (25 mg total) by mouth daily as needed (swelling/fluid).    Dispense:  60 tablet    Refill:  2   pantoprazole (PROTONIX) 40 MG tablet    Sig: Take 1 tablet (40 mg total) by mouth 2 (two) times daily before a meal.    Dispense:  60 tablet    Refill:  2   tiZANidine (ZANAFLEX) 4 MG tablet    Sig: Take 1 tablet (4 mg total) by mouth at bedtime.    Dispense:  30 tablet    Refill:  2     Follow-up: Return in about 2 weeks (around 04/04/2022).    [name], FNP

## 2022-03-21 NOTE — Assessment & Plan Note (Signed)
Controlled Encouraged pt to return to the clinic in 2 weeks and  bring her home BP monitoring machine I would like to see if there is a discrepancy in her monitor compared to a manual reading in the clinic

## 2022-03-21 NOTE — Patient Instructions (Addendum)
I appreciate the opportunity to provide care to you today!    Follow up:  2 weeks  Please bring your BP machine with you to your next appt   Please continue to a heart-healthy diet and increase your physical activities. Try to exercise for 22mns at least three times a week.      It was a pleasure to see you and I look forward to continuing to work together on your health and well-being. Please do not hesitate to call the office if you need care or have questions about your care.   Have a wonderful day and week. With Gratitude, GAlvira MondayMSN, FNP-BC

## 2022-03-22 DIAGNOSIS — S72002D Fracture of unspecified part of neck of left femur, subsequent encounter for closed fracture with routine healing: Secondary | ICD-10-CM | POA: Diagnosis not present

## 2022-03-23 LAB — MICROALBUMIN / CREATININE URINE RATIO
Creatinine, Urine: 21.8 mg/dL
Microalb/Creat Ratio: <14 mg/g creat (ref 0–29)
Microalbumin, Urine: <3 ug/mL

## 2022-03-30 ENCOUNTER — Telehealth: Payer: Self-pay

## 2022-04-01 NOTE — Telephone Encounter (Signed)
Sheri told us she has opened up the [place] schedule if you can get this lady scheduled, thanks.

## 2022-04-04 ENCOUNTER — Encounter: Payer: Self-pay | Admitting: Family Medicine

## 2022-04-04 ENCOUNTER — Encounter: Payer: Self-pay | Admitting: Gastroenterology

## 2022-04-04 ENCOUNTER — Ambulatory Visit: Payer: Medicare PPO | Admitting: Family Medicine

## 2022-04-04 NOTE — Telephone Encounter (Signed)
Patient has been scheduled for PV on 10/24 at 9:00 and colon on 11/7 at 8:00.

## 2022-04-06 ENCOUNTER — Encounter: Payer: Self-pay | Admitting: Family Medicine

## 2022-04-06 ENCOUNTER — Ambulatory Visit: Payer: Medicare PPO | Admitting: Family Medicine

## 2022-04-06 VITALS — BP 132/85 | HR 95 | Ht <= 58 in | Wt 115.0 lb

## 2022-04-06 DIAGNOSIS — I10 Essential (primary) hypertension: Secondary | ICD-10-CM | POA: Diagnosis not present

## 2022-04-06 NOTE — Patient Instructions (Addendum)
I appreciate the opportunity to provide care to you today!    Follow up:  3 months  Labs:next visit   Please follow up with GI as soon as possible      Please continue to a heart-healthy diet and increase your physical activities. Try to exercise for 46mns at least three times a week.      It was a pleasure to see you and I look forward to continuing to work together on your health and well-being. Please do not hesitate to call the office if you need care or have questions about your care.   Have a wonderful day and week. With Gratitude, GAlvira MondayMSN, FNP-BC

## 2022-04-06 NOTE — Progress Notes (Unsigned)
Established Patient Office Visit  Subjective:  Patient ID: Heather Garrett, female    DOB: 01-Dec-1950  Age: 71 y.o. MRN: [id number]  CC:  Chief Complaint  Patient presents with   Hypertension    Pt has blood pressure machine with her today to make sure it is working appropriately.    Headache    C/o headache that has been there since 09/2021, on and off.     HPI Heather Garrett is a 71 y.o. female with past medical history of coronary artery disease, bladder cancer with persistent chronic postprandial abdominal pain, nausea and early satiety presents for f/u of  chronic medical conditions.  Hypertension: Controlled and compliant with the treatment regimen. Notes having elevated ambulatory readings and has brought her BP machine with her today. Ambulatory readings were noted in the 237S systolic and 28B diastolic. She reports occasional headaches and denies chest pain, palpitations, dizziness, and blurred vision.    Past Medical History:  Diagnosis Date   Biceps tendinitis    Bladder cancer ([place]) 2014   Breast cancer ([place]) 1990   Cobalamin deficiency    Coronary arteriosclerosis in native artery    Elevated liver function tests    Hiatal hernia    Hyperlipidemia    Migraine    Myocardial infarction ([facility])    Rotator cuff syndrome    Transitional cell carcinoma, bladder (HCC)    Vitamin D deficiency     Past Surgical History:  Procedure Laterality Date   BREAST SURGERY     CARDIAC CATHETERIZATION     CATARACT EXTRACTION     CHOLECYSTECTOMY  1979   DG  BONE DENSITY ([place] [facility])     ENDOMETRIAL ABLATION     GASTRIC BYPASS     MASTECTOMY     OTHER SURGICAL HISTORY     CANCER SURGERY   TOTAL HIP ARTHROPLASTY Left 09/06/2021   Procedure: TOTAL HIP ARTHROPLASTY;  Surgeon: Willaim Sheng, MD;  Location: [facility];  Service: Orthopedics;  Laterality: Left;    Family History  Problem Relation Age of Onset   Congestive Heart Failure Mother    Coronary artery  disease Father    Heart attack Father 32       cause of death.   Diabetes Mellitus II Sister    Hypertension Sister    Breast cancer Sister    Migraines Neg Hx    Colon cancer Neg Hx    Esophageal cancer Neg Hx    Stomach cancer Neg Hx     Social History   Socioeconomic History   Marital status: Married    Spouse name: Not on file   Number of children: Not on file   Years of education: Not on file   Highest education level: Not on file  Occupational History   Not on file  Tobacco Use   Smoking status: Former    Passive exposure: Never   Smokeless tobacco: Never   Tobacco comments:    Quit 30 yrs ago  Vaping Use   Vaping Use: Never used  Substance and Sexual Activity   Alcohol use: Yes    Alcohol/week: 14.0 standard drinks of alcohol    Types: 14 Glasses of wine per week    Comment: daily, 5 glasses wine   Drug use: Never   Sexual activity: Not on file  Other Topics Concern   Not on file  Social History Narrative   Not on file   Social Determinants of Health  Financial Resource Strain: Not on file  Food Insecurity: Not on file  Transportation Needs: Not on file  Physical Activity: Not on file  Stress: Not on file  Social Connections: Not on file  Intimate Partner Violence: Not on file    Outpatient Medications Prior to Visit  Medication Sig Dispense Refill   amLODipine (NORVASC) 2.5 MG tablet Take 1 tablet (2.5 mg total) by mouth daily. 60 tablet 2   aspirin 81 MG EC tablet Take 1 tablet (81 mg total) by mouth every other day. Swallow whole.  Hold aspirin while taking eliquis, (Patient taking differently: Take 81 mg by mouth daily. Swallow whole.) 30 tablet 0   carvedilol (COREG) 3.125 MG tablet Take 1 tablet (3.125 mg total) by mouth 2 (two) times daily. Hold if sbp less than 100 or heart rate less than 50     Cholecalciferol 25 MCG (1000 UT) tablet Take 1,000 Units by mouth daily.     ezetimibe-simvastatin (VYTORIN) 10-80 MG tablet Take 1 tablet by mouth  at bedtime. 30 tablet 2   famotidine (PEPCID) 40 MG tablet Take 1 tablet (40 mg total) by mouth as needed. 30 tablet 2   folic acid (FOLVITE) 1 MG tablet Take 1 tablet (1 mg total) by mouth daily.  3   hydrochlorothiazide (HYDRODIURIL) 25 MG tablet Take 1 tablet (25 mg total) by mouth daily as needed (swelling/fluid). 60 tablet 2   NURTEC 75 MG TBDP Take 75 mg by mouth daily as needed for headache.     pantoprazole (PROTONIX) 40 MG tablet Take 1 tablet (40 mg total) by mouth 2 (two) times daily before a meal. 60 tablet 2   polyethylene glycol (MIRALAX / GLYCOLAX) 17 g packet Take 17 g by mouth daily. (Patient taking differently: Take 17 g by mouth daily as needed for mild constipation.) 14 each 0   Polyethylene Glycol 400 (BLINK TEARS) 0.25 % SOLN Place 1 drop into both eyes daily as needed (dry eyes).     senna-docusate (SENOKOT-S) 8.6-50 MG tablet Take 1 tablet by mouth at bedtime. 14 tablet 0   sertraline (ZOLOFT) 100 MG tablet Take 1 tablet (100 mg total) by mouth daily. 30 tablet 3   thiamine 100 MG tablet Take 1 tablet (100 mg total) by mouth daily.     tiZANidine (ZANAFLEX) 4 MG tablet Take 1 tablet (4 mg total) by mouth at bedtime. 30 tablet 2   traMADol (ULTRAM) 50 MG tablet Take 50 mg by mouth every 8 (eight) hours as needed.     trimethobenzamide (TIGAN) 300 MG capsule Take 1 capsule (300 mg total) by mouth 3 (three) times daily. 15 capsule 0   vitamin B-12 (CYANOCOBALAMIN) 1000 MCG tablet Take 1,000 mcg by mouth daily.     Vitamin D, Ergocalciferol, (DRISDOL) 1.25 MG (50000 UNIT) CAPS capsule Take 1 capsule (50,000 Units total) by mouth every 7 (seven) days. 5 capsule 1   ferrous sulfate 325 (65 FE) MG EC tablet Take 1 tablet (325 mg total) by mouth every Monday, Wednesday, and Friday. 30 tablet 0   No facility-administered medications prior to visit.    Allergies  Allergen Reactions   Contrast Media [Iodinated Contrast Media] Anaphylaxis and Hives   Ciprofloxacin Other (See  Comments)    Interaction w other medicine   Erythromycin Base Nausea And Vomiting   Sulfabenzamide Other (See Comments)    UNK reaction    ROS Review of Systems  Constitutional:  Negative for fatigue and fever.  Respiratory:  Negative for chest tightness and shortness of breath.   Cardiovascular:  Negative for chest pain and palpitations.  Neurological:  Negative for dizziness.      Objective:    Physical Exam Constitutional:      General: She is not in acute distress.    Appearance: She is not ill-appearing.  HENT:     Head: Normocephalic.     Mouth/Throat:     Mouth: Mucous membranes are moist.  Cardiovascular:     Rate and Rhythm: Normal rate.  Pulmonary:     Effort: Pulmonary effort is normal.     Breath sounds: Normal breath sounds.  Abdominal:     Palpations: Abdomen is soft.  Neurological:     Mental Status: She is alert and oriented to person, place, and time.     BP 132/85   Pulse 95   Ht 4' 8"  (1.422 m)   Wt 115 lb 0.6 oz (52.2 kg)   SpO2 95%   BMI 25.79 kg/m  Wt Readings from Last 3 Encounters:  04/06/22 115 lb 0.6 oz (52.2 kg)  03/21/22 114 lb (51.7 kg)  02/28/22 115 lb 3.2 oz (52.3 kg)    Lab Results  Component Value Date   TSH 2.570 02/09/2022   Lab Results  Component Value Date   WBC 8.1 02/09/2022   HGB 11.0 (L) 02/09/2022   HCT 34.1 02/09/2022   MCV 97 02/09/2022   PLT 329 02/09/2022   Lab Results  Component Value Date   NA 142 02/09/2022   K 5.7 (H) 02/09/2022   CO2 16 (L) 02/09/2022   GLUCOSE 100 (H) 02/09/2022   BUN 11 02/09/2022   CREATININE 0.77 02/09/2022   BILITOT 0.5 12/15/2021   ALKPHOS 178 (H) 12/15/2021   AST 26 12/15/2021   ALT 13 12/15/2021   PROT 6.1 (L) 12/15/2021   ALBUMIN 2.3 (L) 12/15/2021   CALCIUM 9.2 02/09/2022   ANIONGAP 8 12/17/2021   EGFR 82 02/09/2022   GFR 72.00 06/16/2021   Lab Results  Component Value Date   CHOL 221 (H) 02/09/2022   Lab Results  Component Value Date   HDL 155  02/09/2022   Lab Results  Component Value Date   LDLCALC 52 02/09/2022   Lab Results  Component Value Date   TRIG 86 02/09/2022   Lab Results  Component Value Date   CHOLHDL 1.4 02/09/2022   Lab Results  Component Value Date   HGBA1C 5.1 02/09/2022      Assessment & Plan:   Problem List Items Addressed This Visit       Cardiovascular and Mediastinum   Hypertensive disorder - Primary    BP Readings from Last 3 Encounters:  04/06/22 132/85  03/21/22 124/76  03/02/22 104/68  Controlled and compliant with the treatment regimen Encouraged to consider purchasing a new BP machine BP in the office is within normal limits She reports occasional headaches and denies chest pain, palpitations, dizziness, and blurred vision Advised taking Tylenol for headaches, staying well hydrated and getting adequate sleep       No orders of the defined types were placed in this encounter.   Follow-up: Return in about 3 months (around 07/06/2022).    [name], FNP

## 2022-04-07 NOTE — Assessment & Plan Note (Signed)
BP Readings from Last 3 Encounters:  04/06/22 132/85  03/21/22 124/76  03/02/22 104/68  Controlled and compliant with the treatment regimen Encouraged to consider purchasing a new BP machine BP in the office is within normal limits She reports occasional headaches and denies chest pain, palpitations, dizziness, and blurred vision Advised taking Tylenol for headaches, staying well hydrated and getting adequate sleep

## 2022-04-11 DIAGNOSIS — Z1231 Encounter for screening mammogram for malignant neoplasm of breast: Secondary | ICD-10-CM | POA: Diagnosis not present

## 2022-04-19 DIAGNOSIS — S72002D Fracture of unspecified part of neck of left femur, subsequent encounter for closed fracture with routine healing: Secondary | ICD-10-CM | POA: Diagnosis not present

## 2022-04-19 DIAGNOSIS — Z01419 Encounter for gynecological examination (general) (routine) without abnormal findings: Secondary | ICD-10-CM | POA: Diagnosis not present

## 2022-04-19 DIAGNOSIS — B372 Candidiasis of skin and nail: Secondary | ICD-10-CM | POA: Diagnosis not present

## 2022-04-24 NOTE — Progress Notes (Unsigned)
Cardiology Office Note:    Date:  04/24/2022   ID:  Heather Garrett, DOB June 11, 1951, MRN [id number]  PCP:  [name], [place] [facility] Cardiologist:  [name], MD Cardiology APP:  [name]      Referring MD: [name], FNP   No chief complaint on file.   History of Present Illness:    Heather Garrett is a 71 y.o. female with a hx of CAD with prior MI s/p BMS to LAD in 2001, HFpEF, DMII, HTN, HLD, OSA, tobacco abuse, history of breast cancer and history of renal cell carcinoma who presents to clinic for follow-up.  Patient has a known history of MI in 2001 requiring BMS to LAD. Myoview in 2020 negative for ischemia. Last TTE from OSH 09/2020 with EF 50-55, Gr 1 DD, mod LAE, AV sclerosis w/o AS, MAC w trace MR, PASP 33. She initially saw me on 11/2020 for worsening LE edema. We increased lasix at that time with resolution of symptoms.   Was seen in clinic on 05/2021 with worsening LE edema by Dr. Marlou Porch. Ultrasound showed superficial thrombophlebitis but no DVT. She was referred to vascular at that time and they recommended compression stockings. She delcined reflux study.  Was last seen in clinic for pre-op visit prior to hip surgery in 07/2021. Myoview 08/2021 showed apical to mid anterior, anterospetal and apex infarction but no ischemia. EF was normal.  Was admitted to [facility] in 12/2021 for acute hypoxic respiratory failure with V/Q scan revealing perfusion defect in RUL consistent with acute PE. Course was also complicated by acute on chronic diastolic HF. She was diuresed and placed on apixaban.  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[facility])    Vitamin D deficiency     Past Surgical History:  Procedure Laterality Date   BREAST SURGERY     CARDIAC CATHETERIZATION     CATARACT EXTRACTION     CHOLECYSTECTOMY  1979   DG  BONE DENSITY ([place] [facility])     ENDOMETRIAL ABLATION     GASTRIC BYPASS     MASTECTOMY     OTHER SURGICAL HISTORY     CANCER SURGERY   TOTAL HIP ARTHROPLASTY Left 09/06/2021   Procedure: TOTAL HIP ARTHROPLASTY;  Surgeon: Willaim Sheng, MD;  Location: [facility];  Service: Orthopedics;  Laterality: Left;    Current Medications: No outpatient medications have been marked as taking for the 04/27/22 encounter (Appointment) with [name], MD.     Allergies:   Contrast media [iodinated contrast media], Ciprofloxacin, Erythromycin base, and Sulfabenzamide   Social History   Socioeconomic History   Marital status: Married    Spouse name: Not on file   Number of children: Not on file   Years of education: Not on file   Highest education level: Not on file  Occupational History   Not on file  Tobacco Use   Smoking status: Former    Passive exposure: Never   Smokeless tobacco: Never   Tobacco comments:    Quit 30 yrs ago  Vaping Use   Vaping Use: Never used  Substance and Sexual Activity   Alcohol use: Yes    Alcohol/week: 14.0 standard drinks of alcohol    Types: 14 Glasses of wine per week    Comment: daily, 5 glasses wine   Drug use: Never   Sexual activity: Not on file  Other Topics Concern   Not on file  Social History Narrative   Not on file   Social Determinants of Health   Financial Resource Strain: Not on file  Food Insecurity: Not on file  Transportation Needs: Not on file  Physical Activity: Not on file  Stress: Not on file  Social Connections: Not on file     Family History: The patient's family history includes Breast cancer in her sister; Congestive Heart Failure in her mother; Coronary artery disease in her  father; Diabetes Mellitus II in her sister; Heart attack (age of onset: 11) in her father; Hypertension in her sister. There is no history of Migraines, Colon cancer, Esophageal cancer, or Stomach cancer.  ROS:   Please see the history of present illness.    Review of Systems  Constitutional:  Negative for chills, fever and malaise/fatigue.  HENT:  Negative for congestion and nosebleeds.   Eyes:  Negative for blurred vision and photophobia.  Respiratory:  Negative for cough and shortness of breath.   Cardiovascular:  Positive for leg swelling. Negative for chest pain, palpitations, orthopnea, claudication and PND.  Gastrointestinal:  Negative for heartburn and nausea.  Genitourinary:  Negative for dysuria.  Musculoskeletal:  Positive for joint pain (Bilateral hips). Negative for myalgias.  Skin:  Negative for itching and rash.  Neurological:  Positive for weakness. Negative for dizziness and headaches.  Endo/Heme/Allergies:  Does not bruise/bleed easily.  Psychiatric/Behavioral:  The patient is not nervous/anxious and does not have insomnia.    All other systems reviewed and are negative.  EKGs/Labs/Other Studies Reviewed:    The following studies were reviewed today:  TTE 12/2021: IMPRESSIONS     1. Left ventricular ejection fraction, by estimation, is 55 to 60%. The  left ventricle has normal function. The left ventricle has no regional  wall motion abnormalities. There is mild left ventricular hypertrophy.  Left ventricular diastolic parameters  are consistent with Grade I diastolic dysfunction (impaired relaxation).   2. Right ventricular systolic function is normal. The right ventricular  size is normal. There is moderately elevated pulmonary artery systolic  pressure.   3. The mitral valve is grossly normal. No evidence of mitral valve  regurgitation.   4. Tricuspid valve regurgitation is mild to moderate.   5. Aortic valve regurgitation is not visualized. Aortic valve   sclerosis/calcification is present, without any evidence of aortic  stenosis.   6. Aortic no significant ascending aortic aneurysm.   7. The inferior vena cava is normal in size with greater than 50%  respiratory variability, suggesting right atrial pressure of 3 mmHg.   Comparison(s): No prior Echocardiogram.   LE Doppler 12/10/21: Summary:  RIGHT:  - There is no evidence of deep vein thrombosis in the lower extremity.  However, portions of this examination were limited- see technologist  comments above.     - No cystic structure found in the popliteal fossa.     LEFT:  - There is no evidence of deep vein thrombosis in the lower extremity.  However, portions of this examination were limited- see technologist  comments above.     - No cystic structure found in the  popliteal fossa.   Myoview 08/2021:   Findings are consistent with prior myocardial infarction. The study is intermediate risk.   No ST deviation was noted.   LV perfusion is abnormal. There is no evidence of ischemia. There is evidence of infarction. Defect 1: There is a medium defect with moderate reduction in uptake present in the apical to mid anterior, anteroseptal and apex location(s) that is fixed. There is abnormal wall motion in the defect area. Consistent with infarction.   Left ventricular function is normal. Nuclear stress EF: 66 %. The left ventricular ejection fraction is hyperdynamic (>65%). End diastolic cavity size is normal. End systolic cavity size is normal.   Prior study not available for comparison.   Fixed defect in the mid to apical anterior, anteroseptal, and apical segment. This does not worsen with stress. Wall motion mildly abnormal in this area. Suggests prior infarct, no evidence of ischemia.   No evidence of deep venous thrombosis in either lower extremity. However, probable superficial thrombophlebitis involving left superficial greater saphenous vein.   Mesenteric Vascular US  02/22/2021: Summary:  Largest Aortic Diameter: Aortoiliac atherosclerosis with largest  measurements at  the mid segment, measuring 1.8 cm.     Mesenteric:  Normal Celiac artery , Superior Mesenteric artery, Inferior Mesenteric  artery,  Splenic artery and Hepatic artery findings.    CT Chest 01/02/2021 FINDINGS: Cardiovascular: Extensive multi-vessel coronary artery calcification. Global cardiac size within normal limits. No pericardial effusion. The central pulmonary arteries are mildly enlarged in keeping with changes of pulmonary arterial hypertension. Moderate atherosclerotic calcification within the thoracic aorta. No aortic aneurysm.   Mediastinum/Nodes: The visualized thyroid is unremarkable. No pathologic thoracic adenopathy. 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 The esophagus is unremarkable.   Lungs/Pleura: A 4 mm ground-glass pulmonary nodule is seen within the superior segment of the left lower lobe, axial image # 42/5. 3 mm nodule within the a medial right lung base, axial image # 96, demonstrates a benign calcification pattern and is compatible with a tiny granuloma. Mild subpleural reticulation is seen within the right lower lobe with parenchymal banding most in keeping with mild fibrotic change. No other focal pulmonary nodules or infiltrates. No pneumothorax or pleural effusion. The central airways are widely patent.   Upper Abdomen: At least moderate hepatic steatosis. Cholecystectomy has been performed. Partial resection of the upper pole the right kidney noted. No acute abnormality.   Musculoskeletal: No lytic or blastic bone lesion. No acute bone abnormality.   IMPRESSION: Extensive multi-vessel coronary artery calcification.   Pulmonary arterial enlargement in keeping with pulmonary artery hypertension.   4 mm ground-glass pulmonary nodule within the left  lower lobe. No follow-up recommended. This recommendation follows the consensus statement: Guidelines for Management of Incidental Pulmonary Nodules Detected on CT Images: From the Fleischner Society 2017; Radiology 2017; 284:228-243.   Moderate hepatic steatosis.   Aortic Atherosclerosis (ICD10-I70.0).   LE Venous DVT 11/19/2020: Summary:  RIGHT:  - No evidence of common femoral vein obstruction.     LEFT:  - There is no evidence of deep vein thrombosis in the lower extremity.   Echo 09/24/2020: Impression: 1. Suboptimal study due to poor acoustic windows. 2. LV wall thickness is normal with normal left ventricular contractility and an overall ejection fraction of 50-55%. 3. Grade 1 diastolic dysfunction. E/e' measured 8.35. 4. Moderate left atrial enlargement. 5. Normal right atrial measurements. 6. Aortic valve is sclerotic but not stenotic.  7. Mitral valve is normal in mobility and thickness. Mild MAC with trace MR. 8. Tricuspid valve is well visualized and normal. Mild TR with a PA pressure of 33 mmHg.  EKG:   08/06/21: Sinus rhythm, rate 67 bpm  Recent Labs: 12/15/2021: ALT 13; B Natriuretic Peptide 144.3 12/17/2021: Magnesium 2.5 02/09/2022: BUN 11; Creatinine, Ser 0.77; Hemoglobin 11.0; Platelets 329; Potassium 5.7; Sodium 142; TSH 2.570  Recent Lipid Panel    Component Value Date/Time   CHOL 221 (H) 02/09/2022 0807   TRIG 86 02/09/2022 0807   HDL 155 02/09/2022 0807   CHOLHDL 1.4 02/09/2022 0807   LDLCALC 52 02/09/2022 0807     Risk Assessment/Calculations:           Physical Exam:    VS:  There were no vitals taken for this visit.    Wt Readings from Last 3 Encounters:  04/06/22 115 lb 0.6 oz (52.2 kg)  03/21/22 114 lb (51.7 kg)  02/28/22 115 lb 3.2 oz (52.3 kg)     GEN: Well nourished, well developed in no acute distress HEENT: Normal NECK: No JVD; No carotid bruits LYMPHATICS: No lymphadenopathy CARDIAC: RRR, 2/6 systolic murmur, no rubs or  gallops RESPIRATORY:  Clear to auscultation without rales, wheezing or rhonchi  ABDOMEN: Soft, non-tender, non-distended MUSCULOSKELETAL:  No edema; No deformity  SKIN: Warm and dry NEUROLOGIC:  Alert and oriented x 3 PSYCHIATRIC:  Normal affect   ASSESSMENT:    No diagnosis found.  PLAN:    In order of problems listed above:  #Known CAD s/p remote MI with PCI in 2001: Has history of MI in 2001 s/p PCI to LAD. Myoview from 2020 without ischemia. TTE 09/2018 with EF 50-55%, G1DD, trivial MR, mild TR, PASP 33. -Continue ASA 26m daily -Continue coreg 3.1273mBID -Continue vytorin 10-80mg  -Continue valsartan 32022maily  #Chronic Diastolic HF: TTE 06/16/8372th LVEF 55-60%, G1DD, mild-to-mod TR, elevated PASP but in the setting of PE. Currently euvolemic and compensated with NYHA class II symptoms.  -Not on diuretics -Continue BP control as below -Low Na diet   #RUL PE: Hospitalized on 12/2021 for acute hypoxic respiratory failure found to have RUL PE. Has completed a course of apixaban without issue. Will repeated limited TTE before next visit in 6mo42month ensure PASP has normalized. -Off AC -Repeat TTE to assess PASP to ensure normalized after treatment of PE  #LE Edema: Chronic. LE dopplers negative for PE. -Continue HCTZ 25mg60mly   #HTN: Well controlled and at goal at home -Continue coreg 3.125mg 64m-Continue valsartan 320mg d50m -Monitor for symptoms of orthostasis   #HLD: -Continue vytorin 10-80mg daily as above -LDL at goal of 52      Medication Adjustments/Labs and Tests Ordered: Current medicines are reviewed at length with the patient today.  Concerns regarding medicines are outlined above.  No orders of the defined types were placed in this encounter.  No orders of the defined types were placed in this encounter.   There are no Patient Instructions on file for this visit.   I,Mykaella Javier,acting as a scribe for HeatherFreada Bergeronave  documented all relevant documentation on the behalf of HeatherFreada Bergeron directed by  HeatherFreada Bergeronile in the presence of HeatherFreada BergeronI, HeatherFreada Bergeronave reviewed all documentation for this visit. The documentation on 04/24/22 for the exam, diagnosis, procedures, and orders are all accurate and complete.   Signed, HeatherFreada Bergeron  MD  04/24/2022 2:40 PM     [facility]

## 2022-04-27 ENCOUNTER — Ambulatory Visit: Payer: Medicare PPO | Attending: Cardiology | Admitting: Cardiology

## 2022-04-27 ENCOUNTER — Encounter: Payer: Self-pay | Admitting: Cardiology

## 2022-04-27 VITALS — BP 138/84 | HR 96 | Ht <= 58 in | Wt 115.0 lb

## 2022-04-27 DIAGNOSIS — Z86711 Personal history of pulmonary embolism: Secondary | ICD-10-CM

## 2022-04-27 DIAGNOSIS — I5032 Chronic diastolic (congestive) heart failure: Secondary | ICD-10-CM

## 2022-04-27 DIAGNOSIS — E782 Mixed hyperlipidemia: Secondary | ICD-10-CM | POA: Diagnosis not present

## 2022-04-27 DIAGNOSIS — R600 Localized edema: Secondary | ICD-10-CM

## 2022-04-27 DIAGNOSIS — I2510 Atherosclerotic heart disease of native coronary artery without angina pectoris: Secondary | ICD-10-CM | POA: Diagnosis not present

## 2022-04-27 DIAGNOSIS — I10 Essential (primary) hypertension: Secondary | ICD-10-CM | POA: Diagnosis not present

## 2022-04-27 NOTE — Progress Notes (Signed)
Cardiology Office Note:    Date:  04/27/2022   ID:  Heather Garrett, DOB 12-26-1950, MRN [id number]  PCP:  [name], [place] Prov[facility] Cardiologist:  [name], MD Cardiology APP:  [name]      Referring MD: [name], FNP   No chief complaint on file.   History of Present Illness:    Heather Garrett is a 71 y.o. female with a hx of CAD with prior MI s/p BMS to LAD in 2001, HFpEF, DMII, HTN, HLD, OSA, tobacco abuse, history of breast cancer and history of renal cell carcinoma who presents to clinic for follow-up.  Patient has a known history of MI in 2001 requiring BMS to LAD. Myoview in 2020 negative for ischemia. Last TTE from OSH 09/2020 with EF 50-55, Gr 1 DD, mod LAE, AV sclerosis w/o AS, MAC w trace MR, PASP 33. She initially saw me on 11/2020 for worsening LE edema. We increased lasix at that time with resolution of symptoms.   Was seen in clinic on 05/2021 with worsening LE edema by Dr. Marlou Porch. Ultrasound showed superficial thrombophlebitis but no DVT. She was referred to vascular at that time and they recommended compression stockings. She delcined reflux study.  Was last seen in clinic for pre-op visit prior to hip surgery in 07/2021. Myoview 08/2021 showed apical to mid anterior, anterospetal and apex infarction but no ischemia. EF was normal.  Was admitted to [facility] in 12/2021 for acute hypoxic respiratory failure with V/Q scan revealing perfusion defect in RUL consistent with acute PE. Course was also complicated by acute on chronic diastolic HF. She was diuresed and placed on apixaban.  Today, the patient complains of a headache that has been going on for 4 days. She states that the two 500 mg tablets of tylenol don't help as much and she has had difficulty obtaining alternatives for pain management from her providers due to her CHF. Every now and then she does have migraine headaches, but these are different. Her  current headache is mostly localized to her occipital head. She is hoping to see Neuro for further evaluation.  In June she fell and had her hip replaced. A couple of months later she fell again on her hip replacement. She is currently in a wheelchair with limited mobility.  Otherwise, she is doing well from a CV standpoint. Apixaban was stopped after 3 months of treatment. She denies any palpitations, chest pain, shortness of breath, or peripheral edema. No lightheadedness, headaches, syncope, orthopnea, or PND.      Past Medical History:  Diagnosis Date   Biceps tendinitis    Bladder cancer ([place]) 2014   Breast cancer ([place]) 1990   Cobalamin deficiency    Coronary arteriosclerosis in native artery    Elevated liver function tests    Hiatal hernia    Hyperlipidemia    Migraine    Myocardial infarction ([place])    Rotator cuff syndrome    Transitional cell carcinoma, bladder (HCC)    Vitamin D deficiency     Past Surgical History:  Procedure Laterality Date   BREAST SURGERY     CARDIAC CATHETERIZATION     CATARACT EXTRACTION     CHOLECYSTECTOMY  1979   DG  BONE DENSITY ([place] [facility])     ENDOMETRIAL ABLATION     GASTRIC BYPASS     MASTECTOMY     OTHER SURGICAL HISTORY     CANCER SURGERY   TOTAL HIP  ARTHROPLASTY Left 09/06/2021   Procedure: TOTAL HIP ARTHROPLASTY;  Surgeon: Willaim Sheng, MD;  Location: [facility];  Service: Orthopedics;  Laterality: Left;    Current Medications: Current Meds  Medication Sig   amLODipine (NORVASC) 2.5 MG tablet Take 1 tablet (2.5 mg total) by mouth daily.   aspirin 81 MG EC tablet Take 1 tablet (81 mg total) by mouth every other day. Swallow whole.  Hold aspirin while taking eliquis,   carvedilol (COREG) 3.125 MG tablet Take 1 tablet (3.125 mg total) by mouth 2 (two) times daily. Hold if sbp less than 100 or heart rate less than 50   celecoxib (CELEBREX) 100 MG capsule Take 100 mg by mouth 2 (two) times daily.   Cholecalciferol 25 MCG (1000  UT) tablet Take 1,000 Units by mouth daily.   ezetimibe-simvastatin (VYTORIN) 10-80 MG tablet Take 1 tablet by mouth at bedtime.   famotidine (PEPCID) 40 MG tablet Take 1 tablet (40 mg total) by mouth as needed.   ferrous sulfate 325 (65 FE) MG EC tablet Take 1 tablet (325 mg total) by mouth every Monday, Wednesday, and Friday.   folic acid (FOLVITE) 1 MG tablet Take 1 tablet (1 mg total) by mouth daily.   hydrochlorothiazide (HYDRODIURIL) 25 MG tablet Take 1 tablet (25 mg total) by mouth daily as needed (swelling/fluid).   NURTEC 75 MG TBDP Take 75 mg by mouth daily as needed for headache.   nystatin-triamcinolone ointment (MYCOLOG) 1 Topical Daily   pantoprazole (PROTONIX) 40 MG tablet Take 1 tablet (40 mg total) by mouth 2 (two) times daily before a meal.   polyethylene glycol (MIRALAX / GLYCOLAX) 17 g packet Take 17 g by mouth daily.   Polyethylene Glycol 400 (BLINK TEARS) 0.25 % SOLN Place 1 drop into both eyes daily as needed (dry eyes).   senna-docusate (SENOKOT-S) 8.6-50 MG tablet Take 1 tablet by mouth at bedtime.   sertraline (ZOLOFT) 100 MG tablet Take 1 tablet (100 mg total) by mouth daily.   thiamine 100 MG tablet Take 1 tablet (100 mg total) by mouth daily.   tiZANidine (ZANAFLEX) 4 MG tablet Take 1 tablet (4 mg total) by mouth at bedtime.   trimethobenzamide (TIGAN) 300 MG capsule Take 1 capsule (300 mg total) by mouth 3 (three) times daily.   vitamin B-12 (CYANOCOBALAMIN) 1000 MCG tablet Take 1,000 mcg by mouth daily.   Vitamin D, Ergocalciferol, (DRISDOL) 1.25 MG (50000 UNIT) CAPS capsule Take 1 capsule (50,000 Units total) by mouth every 7 (seven) days.     Allergies:   Contrast media [iodinated contrast media], Ciprofloxacin, Erythromycin base, and Sulfabenzamide   Social History   Socioeconomic History   Marital status: Married    Spouse name: Not on file   Number of children: Not on file   Years of education: Not on file   Highest education level: Not on file   Occupational History   Not on file  Tobacco Use   Smoking status: Former    Passive exposure: Never   Smokeless tobacco: Never   Tobacco comments:    Quit 30 yrs ago  Vaping Use   Vaping Use: Never used  Substance and Sexual Activity   Alcohol use: Yes    Alcohol/week: 14.0 standard drinks of alcohol    Types: 14 Glasses of wine per week    Comment: daily, 5 glasses wine   Drug use: Never   Sexual activity: Not on file  Other Topics Concern   Not on file  Social History Narrative   Not on file   Social Determinants of Health   Financial Resource Strain: Not on file  Food Insecurity: Not on file  Transportation Needs: Not on file  Physical Activity: Not on file  Stress: Not on file  Social Connections: Not on file     Family History: The patient's family history includes Breast cancer in her sister; Congestive Heart Failure in her mother; Coronary artery disease in her father; Diabetes Mellitus II in her sister; Heart attack (age of onset: 46) in her father; Hypertension in her sister. There is no history of Migraines, Colon cancer, Esophageal cancer, or Stomach cancer.  ROS:   Please see the history of present illness.    Review of Systems  Constitutional:  Negative for chills, fever and malaise/fatigue.  HENT:  Negative for congestion and nosebleeds.   Eyes:  Negative for blurred vision and photophobia.  Respiratory:  Negative for cough and shortness of breath.   Cardiovascular:  Negative for chest pain, palpitations, orthopnea, claudication, leg swelling and PND.  Gastrointestinal:  Negative for heartburn and nausea.  Genitourinary:  Negative for dysuria.  Musculoskeletal:  Positive for falls (In June and a couple of months later) and joint pain (Bilateral hips). Negative for myalgias.  Skin:  Negative for itching and rash.  Neurological:  Positive for headaches. Negative for dizziness and weakness.  Endo/Heme/Allergies:  Does not bruise/bleed easily.   Psychiatric/Behavioral:  The patient is not nervous/anxious and does not have insomnia.    All other systems reviewed and are negative.  EKGs/Labs/Other Studies Reviewed:    The following studies were reviewed today:  LE Bilateral Venous Doppler 12/10/2021:  Summary:  RIGHT:  - There is no evidence of deep vein thrombosis in the lower extremity.  However, portions of this examination were limited- see technologist  comments above.     - No cystic structure found in the popliteal fossa.     LEFT:  - There is no evidence of deep vein thrombosis in the lower extremity.  However, portions of this examination were limited- see technologist  comments above.     - No cystic structure found in the popliteal fossa.   TTE 12/2021: IMPRESSIONS     1. Left ventricular ejection fraction, by estimation, is 55 to 60%. The  left ventricle has normal function. The left ventricle has no regional  wall motion abnormalities. There is mild left ventricular hypertrophy.  Left ventricular diastolic parameters  are consistent with Grade I diastolic dysfunction (impaired relaxation).   2. Right ventricular systolic function is normal. The right ventricular  size is normal. There is moderately elevated pulmonary artery systolic  pressure.   3. The mitral valve is grossly normal. No evidence of mitral valve  regurgitation.   4. Tricuspid valve regurgitation is mild to moderate.   5. Aortic valve regurgitation is not visualized. Aortic valve  sclerosis/calcification is present, without any evidence of aortic  stenosis.   6. Aortic no significant ascending aortic aneurysm.   7. The inferior vena cava is normal in size with greater than 50%  respiratory variability, suggesting right atrial pressure of 3 mmHg.   Comparison(s): No prior Echocardiogram.   LE Doppler 12/10/21: Summary:  RIGHT:  - There is no evidence of deep vein thrombosis in the lower extremity.  However, portions of this  examination were limited- see technologist  comments above.     - No cystic structure found in the popliteal fossa.     LEFT:  -  There is no evidence of deep vein thrombosis in the lower extremity.  However, portions of this examination were limited- see technologist  comments above.     - No cystic structure found in the popliteal fossa.   Myoview 08/2021:   Findings are consistent with prior myocardial infarction. The study is intermediate risk.   No ST deviation was noted.   LV perfusion is abnormal. There is no evidence of ischemia. There is evidence of infarction. Defect 1: There is a medium defect with moderate reduction in uptake present in the apical to mid anterior, anteroseptal and apex location(s) that is fixed. There is abnormal wall motion in the defect area. Consistent with infarction.   Left ventricular function is normal. Nuclear stress EF: 66 %. The left ventricular ejection fraction is hyperdynamic (>65%). End diastolic cavity size is normal. End systolic cavity size is normal.   Prior study not available for comparison.   Fixed defect in the mid to apical anterior, anteroseptal, and apical segment. This does not worsen with stress. Wall motion mildly abnormal in this area. Suggests prior infarct, no evidence of ischemia.   No evidence of deep venous thrombosis in either lower extremity. However, probable superficial thrombophlebitis involving left superficial greater saphenous vein.   Mesenteric Vascular US 02/22/2021: Summary:  Largest Aortic Diameter: Aortoiliac atherosclerosis with largest  measurements at  the mid segment, measuring 1.8 cm.     Mesenteric:  Normal Celiac artery , Superior Mesenteric artery, Inferior Mesenteric  artery,  Splenic artery and Hepatic artery findings.    CT Chest 01/02/2021 FINDINGS: Cardiovascular: Extensive multi-vessel coronary artery calcification. Global cardiac size within normal limits. No pericardial effusion. The  central pulmonary arteries are mildly enlarged in keeping with changes of pulmonary arterial hypertension. Moderate atherosclerotic calcification within the thoracic aorta. No aortic aneurysm.   Mediastinum/Nodes: The visualized thyroid is unremarkable. No pathologic thoracic adenopathy. 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 The esophagus is unremarkable.   Lungs/Pleura: A 4 mm ground-glass pulmonary nodule is seen within the superior segment of the left lower lobe, axial image # 42/5. 3 mm nodule within the a medial right lung base, axial image # 96, demonstrates a benign calcification pattern and is compatible with a tiny granuloma. Mild subpleural reticulation is seen within the right lower lobe with parenchymal banding most in keeping with mild fibrotic change. No other focal pulmonary nodules or infiltrates. No pneumothorax or pleural effusion. The central airways are widely patent.   Upper Abdomen: At least moderate hepatic steatosis. Cholecystectomy has been performed. Partial resection of the upper pole the right kidney noted. No acute abnormality.   Musculoskeletal: No lytic or blastic bone lesion. No acute bone abnormality.   IMPRESSION: Extensive multi-vessel coronary artery calcification.   Pulmonary arterial enlargement in keeping with pulmonary artery hypertension.   4 mm ground-glass pulmonary nodule within the left lower lobe. No follow-up recommended. This recommendation follows the consensus statement: Guidelines for Management of Incidental Pulmonary Nodules Detected on CT Images: From the Fleischner Society 2017; Radiology 2017; 284:228-243.   Moderate hepatic steatosis.   Aortic Atherosclerosis (ICD10-I70.0).   LE Venous DVT 11/19/2020: Summary:  RIGHT:  - No evidence of common femoral vein obstruction.     LEFT:  - There is no evidence of deep vein thrombosis  in the lower extremity.   Echo 09/24/2020: Impression: 1. Suboptimal study due to poor acoustic windows. 2. LV wall thickness is normal with normal  left ventricular contractility and an overall ejection fraction of 50-55%. 3. Grade 1 diastolic dysfunction. E/e' measured 8.35. 4. Moderate left atrial enlargement. 5. Normal right atrial measurements. 6. Aortic valve is sclerotic but not stenotic. 7. Mitral valve is normal in mobility and thickness. Mild MAC with trace MR. 8. Tricuspid valve is well visualized and normal. Mild TR with a PA pressure of 33 mmHg.  EKG: EKG is personally reviewed. 04/27/2022: EKG was not ordered. 02/03/2022 (ED):  Shows no signs of acute ischemia 08/06/21: Sinus rhythm, rate 67 bpm  Recent Labs: 12/15/2021: ALT 13; B Natriuretic Peptide 144.3 12/17/2021: Magnesium 2.5 02/09/2022: BUN 11; Creatinine, Ser 0.77; Hemoglobin 11.0; Platelets 329; Potassium 5.7; Sodium 142; TSH 2.570  Recent Lipid Panel    Component Value Date/Time   CHOL 221 (H) 02/09/2022 0807   TRIG 86 02/09/2022 0807   HDL 155 02/09/2022 0807   CHOLHDL 1.4 02/09/2022 0807   LDLCALC 52 02/09/2022 0807     Risk Assessment/Calculations:           Physical Exam:    VS:  BP 138/84   Pulse 96   Ht _0  (1.422 m)   Wt 115 lb (52.2 kg)   SpO2 95%   BMI 25.78 kg/m     Wt Readings from Last 3 Encounters:  04/27/22 115 lb (52.2 kg)  04/06/22 115 lb 0.6 oz (52.2 kg)  03/21/22 114 lb (51.7 kg)     GEN: Well nourished, well developed in no acute distress; In wheelchair HEENT: Normal NECK: No JVD; No carotid bruits CARDIAC: RRR, 2/6 systolic murmur. No rubs or gallops RESPIRATORY:  Clear to auscultation without rales, wheezing or rhonchi  ABDOMEN: Soft, non-tender, non-distended MUSCULOSKELETAL:  No edema; No deformity  SKIN: Warm and dry NEUROLOGIC:  Alert and oriented x 3 PSYCHIATRIC:  Normal affect   ASSESSMENT:    1. Coronary artery disease involving native coronary artery of  native heart without angina pectoris   2. Lower extremity edema   3. Mixed hyperlipidemia   4. Chronic heart failure with preserved ejection fraction (HCC)   5. Essential hypertension   6. History of pulmonary embolus (PE)    PLAN:    In order of problems listed above:  #Known CAD s/p remote MI with PCI in 2001: Has history of MI in 2001 s/p PCI to LAD. Myoview from 2020 without ischemia. TTE 09/2018 with EF 50-55%, G1DD, trivial MR, mild TR, PASP 33. -Continue ASA 29m daily -Continue coreg 3.12101mBID -Continue vytorin 10-80mg  -Continue valsartan 32072maily  #Chronic Diastolic HF: TTE 06/16/0737th LVEF 55-60%, G1DD, mild-to-mod TR, elevated PASP but in the setting of PE. Currently euvolemic and compensated with NYHA class II symptoms.  -Not on diuretics -Continue BP control as below -Low Na diet   #RUL PE: Hospitalized on 12/2021 for acute hypoxic respiratory failure found to have RUL PE. Has completed a course of apixaban without issue. Will repeated limited TTE before next visit in 69mo17month ensure PASP has normalized. -Off AC -Repeat TTE to assess PASP to ensure normalized after treatment of PE  #LE Edema: Chronic. LE dopplers negative for PE. -Continue HCTZ 25mg54mly   #HTN: Well controlled and at goal at home -Continue coreg 3.125mg 73m-Continue valsartan 320mg d98m -Monitor for symptoms of orthostasis   #HLD: -Continue vytorin 10-80mg daily as above -LDL at goal of 52     Follow-up: 6 months  Medication Adjustments/Labs and Tests Ordered: Current medicines are reviewed at length with  the patient today.  Concerns regarding medicines are outlined above.   Orders Placed This Encounter  Procedures   ECHOCARDIOGRAM LIMITED   No orders of the defined types were placed in this encounter.  Patient Instructions  Medication Instructions:   Your physician recommends that you continue on your current medications as directed. Please refer to the Current  Medication list given to you today.  *If you need a refill on your cardiac medications before your next appointment, please call your pharmacy*   Testing/Procedures:  Your physician has requested that you have an LIMITED echocardiogram. Echocardiography is a painless test that uses sound waves to create images of your heart. It provides your doctor with information about the size and shape of your heart and how well your heart's chambers and valves are working. This procedure takes approximately one hour. There are no restrictions for this procedure.  SCHEDULE TO BE DONE IN 5 MONTHS-ASSESS PA PRESSURES DURING ECHO PER DR. Johney Frame   Please do NOT wear cologne, perfume, aftershave, or lotions (deodorant is allowed). Please arrive 15 minutes prior to your appointment time.    Follow-Up: At [facility], you and your health needs are our priority.  As part of our continuing mission to provide you with exceptional heart care, we have created designated Provider Care Teams.  These Care Teams include your primary Cardiologist (physician) and Advanced Practice Providers (APPs -  Physician Assistants and Nurse Practitioners) who all work together to provide you with the care you need, when you need it.  We recommend signing up for the patient portal called "MyChart".  Sign up information is provided on this After Visit Summary.  MyChart is used to connect with patients for Virtual Visits (Telemedicine).  Patients are able to view lab/test results, encounter notes, upcoming appointments, etc.  Non-urgent messages can be sent to your provider as well.   To learn more about what you can do with MyChart, go to [email or web address].    Your next appointment:   6 month(s)  The format for your next appointment:   In Person  Provider:   DR. Johney Frame    Important Information About Sugar         I,Mathew Stumpf,acting as a scribe for [name], MD.,have documented all  relevant documentation on the behalf of [name], MD,as directed by  [name], MD while in the presence of [name], MD.  I, [name], MD, have reviewed all documentation for this visit. The documentation on 04/27/22 for the exam, diagnosis, procedures, and orders are all accurate and complete.   Signed, [name], MD  04/27/2022 3:20 PM    [place]

## 2022-04-27 NOTE — Patient Instructions (Signed)
Medication Instructions:   Your physician recommends that you continue on your current medications as directed. Please refer to the Current Medication list given to you today.  *If you need a refill on your cardiac medications before your next appointment, please call your pharmacy*   Testing/Procedures:  Your physician has requested that you have an LIMITED echocardiogram. Echocardiography is a painless test that uses sound waves to create images of your heart. It provides your doctor with information about the size and shape of your heart and how well your heart's chambers and valves are working. This procedure takes approximately one hour. There are no restrictions for this procedure.  SCHEDULE TO BE DONE IN 5 MONTHS-ASSESS PA PRESSURES DURING ECHO PER DR. Johney Frame   Please do NOT wear cologne, perfume, aftershave, or lotions (deodorant is allowed). Please arrive 15 minutes prior to your appointment time.    Follow-Up: At [facility], you and your health needs are our priority.  As part of our continuing mission to provide you with exceptional heart care, we have created designated Provider Care Teams.  These Care Teams include your primary Cardiologist (physician) and Advanced Practice Providers (APPs -  Physician Assistants and Nurse Practitioners) who all work together to provide you with the care you need, when you need it.  We recommend signing up for the patient portal called "MyChart".  Sign up information is provided on this After Visit Summary.  MyChart is used to connect with patients for Virtual Visits (Telemedicine).  Patients are able to view lab/test results, encounter notes, upcoming appointments, etc.  Non-urgent messages can be sent to your provider as well.   To learn more about what you can do with MyChart, go to [email or web address].    Your next appointment:   6 month(s)  The format for your next appointment:   In Person  Provider:   DR. Johney Frame     Important Information About Sugar

## 2022-04-28 NOTE — Progress Notes (Signed)
[facility] [address] [place]. [place] [address] [phone]       OFFICE FOLLOW UP NOTE  [name] Date of Birth:  [birth year] Medical Record Number:  [id number]   Primary neurologist: Dr. Jaynee Eagles Reason for visit: Headaches/migraines    SUBJECTIVE:   CHIEF COMPLAINT:  Chief Complaint  Patient presents with   Migraine    Rm 3, one year FU "no one will give me anything for my headaches because I have CHF,  they tell me to take tylenol, I get up with headaches; years ago I took Imitrex shots"    HPI:    Update 05/02/2022 JM: Patient returns for acute visit due to worsening headaches.  She was previously seen over 1 year ago with Dr. Jaynee Eagles and mainly discussed dizziness at that time as this was her main concern.   She was referred for sleep evaluation at prior visit and was scheduled for initial evaluation last year but were canceled by provider and no showed appointment in December.  She reports persistent headaches since prior visit with some worsening over the past 6 months. Reports posterior/occipital location, present daily upon awakening, can have more severe headaches at times, about 4-5 per month that can last 2-3 days. Does have occasional visual aura (bright sparkling lights) that last short duration. These severe headaches are debilitating and she will usually need to lay down.  Can have photophobia and phonophobia.  Denies N/V. Use of Tylenol 500 mg twice daily and occasionally extra dose during the day with severe headaches. She does have rx for Nurtec, will take the edge off but headache will return after couple hours.  She does have chronic history of migraines, previously frontal/temporal, previously tried hydrocodone and topiramate, use of Imitrex subcu injections as needed.  She is currently on sertraline for depression.  She has been closely following with PCP/cardiology for HTN which she reports can fluctuate, today's pressure  elevated but she has not yet taken BP meds today.  Reports having difficulty currently receiving medications for headaches due to underlying history of CHF.  She also notes back in June, she fell requiring left hip replacement and unfortunately a couple months later fell again with a hairline fracture of left hip and currently with limited mobility.      History provided for reference purposes only Consult visit 03/23/2021 Dr. Jaynee Eagles: [name] is a 71 y.o. female here as requested by Michael Boston, MD for headaches.  Past medical history new daily persistent headache, prior bariatric surgery, fibromyalgia, hypertension, breast cancer, GERD, hyperlipidemia, coronary artery disease, renal cell cancer, migraine headache, history of bladder cancer, major depression, history of breast cancer, migraine without aura.  She is currently on topiramate.   She gets dizzy when standing up. She started waking up with headaches 3-4 months ago. Her biggest concern right now is dizziness on standing. If she rolls to the left she has nausea and dizziness, doesn't affect her to the right but affects her to left. When she rolls over on the left she gets dizzy, room spins, more nausea, brief. She has had migraines in the past, but denies migraines now, she wake up with a slight headache. She does not sleep well. She had a sleep study years ago and used a cpap but lost a lot of weight and stopped using it, she doesn't sleep well, she is extremely fatigued, doesn't feel refreshed in the morning, wakes often.No other focal neurologic deficits, associated symptoms, inciting events  or modifiable factors.   Reviewed notes, labs and imaging from outside physicians, which showed fatigue   TSH normal 3.03 Vit D very low 10.7   IMPRESSION: personally reviewed image and agree No evidence of acute intracranial abnormality.   Mild-to-moderate chronic small vessel ischemic changes within the cerebral white matter, and to  a lesser degree within the pons.   Mild-to-moderate generalized parenchymal atrophy.       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[facility])    Vitamin D deficiency     PSH:  Past Surgical History:  Procedure Laterality Date   BREAST SURGERY     CARDIAC CATHETERIZATION     CATARACT EXTRACTION     CHOLECYSTECTOMY  1979   DG  BONE DENSITY ([place] [facility])     ENDOMETRIAL ABLATION     GASTRIC BYPASS     MASTECTOMY     OTHER SURGICAL HISTORY     CANCER SURGERY   TOTAL HIP ARTHROPLASTY Left 09/06/2021   Procedure: TOTAL HIP ARTHROPLASTY;  Surgeon: Willaim Sheng, MD;  Location: [facility];  Service: Orthopedics;  Laterality: Left;    Social History:  Social History   Socioeconomic History   Marital status: Married    Spouse name: Not on file   Number of children: Not on file   Years of education: Not on file   Highest education level: Not on file  Occupational History   Not on file  Tobacco Use   Smoking status: Former    Passive exposure: Never   Smokeless tobacco: Never   Tobacco comments:    Quit 30 yrs ago  Vaping Use   Vaping Use: Never used  Substance and Sexual Activity   Alcohol use: Yes    Alcohol/week: 14.0 standard drinks of alcohol    Types: 14 Glasses of wine per week    Comment: daily, 2glasses wine daily   Drug use: Never   Sexual activity: Not on file  Other Topics Concern   Not on file  Social History Narrative   Lives with husband   Social Determinants of Health   Financial  Resource Strain: Not on file  Food Insecurity: Not on file  Transportation Needs: Not on file  Physical Activity: Not on file  Stress: Not on file  Social Connections: Not on file  Intimate Partner Violence: Not on file    Family History:  Family History  Problem Relation Age of Onset   Congestive Heart Failure Mother    Coronary artery disease Father    Heart attack Father 38       cause of death.   Diabetes Mellitus II Sister    Hypertension Sister    Breast cancer Sister    Migraines Neg Hx    Colon cancer Neg Hx    Esophageal cancer Neg Hx    Stomach cancer Neg Hx     Medications:   Current Outpatient Medications on File  Prior to Visit  Medication Sig Dispense Refill   alendronate (FOSAMAX) 70 MG tablet 70 mg once a week.     amLODipine (NORVASC) 2.5 MG tablet Take 1 tablet (2.5 mg total) by mouth daily. 60 tablet 2   aspirin 81 MG EC tablet Take 1 tablet (81 mg total) by mouth every other day. Swallow whole.  Hold aspirin while taking eliquis, 30 tablet 0   carvedilol (COREG) 3.125 MG tablet Take 1 tablet (3.125 mg total) by mouth 2 (two) times daily. Hold if sbp less than 100 or heart rate less than 50     celecoxib (CELEBREX) 100 MG capsule Take 100 mg by mouth 2 (two) times daily.     Cholecalciferol 25 MCG (1000 UT) tablet Take 1,000 Units by mouth daily.     ezetimibe-simvastatin (VYTORIN) 10-80 MG tablet Take 1 tablet by mouth at bedtime. 30 tablet 2   famotidine (PEPCID) 40 MG tablet Take 1 tablet (40 mg total) by mouth as needed. 30 tablet 2   ferrous sulfate 325 (65 FE) MG EC tablet Take 1 tablet (325 mg total) by mouth every Monday, Wednesday, and Friday. 30 tablet 0   folic acid (FOLVITE) 1 MG tablet Take 1 tablet (1 mg total) by mouth daily.  3   hydrochlorothiazide (HYDRODIURIL) 25 MG tablet Take 1 tablet (25 mg total) by mouth daily as needed (swelling/fluid). 60 tablet 2   NURTEC 75 MG TBDP Take 75 mg by mouth daily as needed for headache.      nystatin-triamcinolone ointment (MYCOLOG) 1 Topical Daily     pantoprazole (PROTONIX) 40 MG tablet Take 1 tablet (40 mg total) by mouth 2 (two) times daily before a meal. 60 tablet 2   polyethylene glycol (MIRALAX / GLYCOLAX) 17 g packet Take 17 g by mouth daily. 14 each 0   Polyethylene Glycol 400 (BLINK TEARS) 0.25 % SOLN Place 1 drop into both eyes daily as needed (dry eyes).     senna-docusate (SENOKOT-S) 8.6-50 MG tablet Take 1 tablet by mouth at bedtime. 14 tablet 0   sertraline (ZOLOFT) 100 MG tablet Take 1 tablet (100 mg total) by mouth daily. 30 tablet 3   thiamine 100 MG tablet Take 1 tablet (100 mg total) by mouth daily.     tiZANidine (ZANAFLEX) 4 MG tablet Take 1 tablet (4 mg total) by mouth at bedtime. 30 tablet 2   trimethobenzamide (TIGAN) 300 MG capsule Take 1 capsule (300 mg total) by mouth 3 (three) times daily. 15 capsule 0   vitamin B-12 (CYANOCOBALAMIN) 1000 MCG tablet Take 1,000 mcg by mouth daily.     Vitamin D, Ergocalciferol, (DRISDOL) 1.25 MG (50000 UNIT) CAPS capsule Take 1 capsule (50,000 Units total) by mouth every 7 (seven) days. 5 capsule 1   No current facility-administered medications on file prior to visit.    Allergies:   Allergies  Allergen Reactions   Contrast Media [Iodinated Contrast Media] Anaphylaxis and Hives   Ciprofloxacin Other (See Comments)    Interaction w other medicine   Erythromycin Base Nausea And Vomiting   Sulfabenzamide Other (See Comments)    UNK reaction      OBJECTIVE:  Physical Exam  Vitals:   05/02/22 1402  BP: (!) 176/104  Pulse: 91  Weight: 115 lb (52.2 kg)  Height: '4\' 8"'$  (1.422 m)   Body mass index is 25.78 kg/m. No results found.   General: well developed, well nourished, very pleasant elderly Caucasian female, seated, in no evident distress Head:  head normocephalic and atraumatic.   Neck: supple with no carotid or supraclavicular bruits Cardiovascular: regular rate and rhythm, no murmurs Skin:  no  rash/petichiae Vascular:  Normal pulses all extremities   Neurologic Exam Mental Status: Awake and fully alert. Oriented to place and time. Recent and remote memory intact. Attention span, concentration and fund of knowledge appropriate. Mood and affect appropriate.  Cranial Nerves: Pupils equal, briskly reactive to light. Extraocular movements full without nystagmus. Visual fields full to confrontation. Hearing intact. Facial sensation intact. Face, tongue, palate moves normally and symmetrically.  Motor: Normal bulk and tone. Normal strength in all tested extremity muscles except limited testing in LLE Sensory.: intact to touch , pinprick , position and vibratory sensation.  Coordination: Rapid alternating movements normal in all extremities. Finger-to-nose and heel-to-shin performed accurately bilaterally. Gait and Station: Deferred Reflexes: 1+ and symmetric. Toes downgoing.         ASSESSMENT/PLAN: TERILYNN BURESH is a 71 y.o. year old female   1.  Chronic headaches  -Acute mixed features with tension and migrainous type, daily tension type headaches and about 12-15 migrainous type headaches per month  -Start Qulipta 60 mg daily for preventative  -Start Ubrelvy 100 mg as needed for abortive therapy, can repeat x1 after 2 hours if needed, max dose '200mg'$ /24 hrs  -previously tried/failed: Topiramate, hydrocodone, sumatriptan, Nurtec  -Unable to place on beta-blockers due to cardiac history, hesitant to trial antidepressants due to current use of sertraline.  Not a candidate for triptans due to cardiac history  -Suspect analgesic rebound headache contributing, discussed importance of limiting OTC pain reliever usage  -Referral placed to [facility] to rule out sleep apnea due to awakening daily with headaches, daytime fatigue, nocturia and insomnia  -Discussed importance of adequate blood pressure control as fluctuation and high blood pressure can contribute to headaches, she  is closely being followed by PCP    Follow up in 3 months or call earlier if needed    CC:  PCP: [name], FNP    I spent 34 minutes of face-to-face and non-face-to-face time with patient.  This included previsit chart review, lab review, study review, order entry, electronic health record documentation, patient education regarding above diagnoses and treatment plan and answered all the questions to patient satisfaction   Frann Rider, [facility]  [facility] [address] [place], [place] [address]  Phone [phone] Fax [phone] Note: This document was prepared with digital dictation and possible smart phrase technology. Any transcriptional errors that result from this process are unintentional.

## 2022-05-02 ENCOUNTER — Encounter: Payer: Self-pay | Admitting: Adult Health

## 2022-05-02 ENCOUNTER — Ambulatory Visit: Payer: Medicare PPO | Admitting: Adult Health

## 2022-05-02 VITALS — BP 176/104 | HR 91 | Ht <= 58 in | Wt 115.0 lb

## 2022-05-02 DIAGNOSIS — R29818 Other symptoms and signs involving the nervous system: Secondary | ICD-10-CM | POA: Diagnosis not present

## 2022-05-02 DIAGNOSIS — G43E09 Chronic migraine with aura, not intractable, without status migrainosus: Secondary | ICD-10-CM

## 2022-05-02 DIAGNOSIS — R519 Headache, unspecified: Secondary | ICD-10-CM | POA: Diagnosis not present

## 2022-05-02 MED ORDER — QULIPTA 60 MG PO TABS: 60.0000 mg | ORAL_TABLET | Freq: Every day | ORAL | 5 refills | Status: DC

## 2022-05-02 MED ORDER — UBRELVY 100 MG PO TABS: 100.0000 mg | ORAL_TABLET | Freq: Every day | ORAL | 5 refills | Status: DC | PRN

## 2022-05-02 NOTE — Patient Instructions (Addendum)
Your Plan:  Start Qulipta '60mg'$  daily for headache prevention  Start Roselyn Meier, can repeat x1 after 2 hours if needed for abortive headache management  Try to limit tylenol as this can cause rebound headaches (worsening headaches)  Would highly recommend sleep evaluation to evaluate for sleep apnea     Follow up in 3 months or call earlier if needed     Thank you for coming to see [place] at [facility]. I hope we have been able to provide you high quality care today.  You may receive a patient satisfaction survey over the next few weeks. We would appreciate your feedback and comments so that we may continue to improve ourselves and the health of our patients.

## 2022-05-03 ENCOUNTER — Telehealth: Payer: Self-pay | Admitting: Gastroenterology

## 2022-05-03 ENCOUNTER — Ambulatory Visit (AMBULATORY_SURGERY_CENTER): Payer: Self-pay | Admitting: *Deleted

## 2022-05-03 VITALS — Ht <= 58 in | Wt 115.0 lb

## 2022-05-03 DIAGNOSIS — Z86010 Personal history of colonic polyps: Secondary | ICD-10-CM

## 2022-05-03 MED ORDER — PEG 3350-KCL-NA BICARB-NACL 420 G PO SOLR: 4000.0000 mL | Freq: Once | ORAL | 0 refills | Status: AC

## 2022-05-03 NOTE — Progress Notes (Signed)
No egg or soy allergy known to patient  No issues known to pt with past sedation with any surgeries or procedures Patient denies ever being told they had issues or difficulty with intubation  No FH of Malignant Hyperthermia Pt is not on diet pills Pt is not on  home 02  Pt is not on blood thinners  Pt denies issues with constipation,2 day prep given,see previous procedure report. No A fib or A flutter Have any cardiac testing pending--NO Pt instructed to use Singlecare.com or GoodRx for a price reduction on prep    Patient's chart reviewed by Osvaldo Angst CNRA prior to previsit and patient appropriate for the [place].  Previsit completed and red dot placed by patient's name on their procedure day (on provider's schedule).

## 2022-05-03 NOTE — Telephone Encounter (Signed)
Inbound call from patient states she went to the dentist today and they prescribed her amoxicillin, doesn't know if she should take with upcoming procedure. Please advise.

## 2022-05-03 NOTE — Telephone Encounter (Signed)
Spoke with pt and told her that Amoxicillin will not affect her procedure and ok to proceed as scheduled

## 2022-05-05 ENCOUNTER — Telehealth: Payer: Self-pay

## 2022-05-05 NOTE — Telephone Encounter (Signed)
PA for Heather Garrett has been sent to plan via CMM. Awaiting determination from [facility].  Key: Rochele Pages

## 2022-05-05 NOTE — Telephone Encounter (Signed)
PA for Ubrelvy 100 MG has been APPROVED. Coverage: 07/11/2021-07/11/2023 Case: [id number] Pharmacy notified via fax.

## 2022-05-05 NOTE — Telephone Encounter (Signed)
PA for Ubrelvy 100 MG has been sent to plan via CMM. Awaiting determination from [facility].  KEY: [id number]

## 2022-05-05 NOTE — Telephone Encounter (Addendum)
PA Qulipta '60mg'$   has been APPROVED. Coverage: 07/11/2021-07/11/2023 Case: [id number] Pharmacy notified via fax.

## 2022-05-10 ENCOUNTER — Encounter: Payer: Self-pay | Admitting: Gastroenterology

## 2022-05-16 ENCOUNTER — Encounter: Payer: Self-pay | Admitting: Physician Assistant

## 2022-05-16 NOTE — Progress Notes (Unsigned)
Cardiology Clinic Note   Patient Name: Heather Garrett Date of Encounter: 05/17/2022  Primary Care Provider:  [name], [place] Primary Cardiologist:  [name], MD  Patient Profile    Heather Garrett is a 71 year-old female with a past medical history of CAD s/p BMS to LAD in 9629, chronic diastolic CHF, hypertension, orthostatic hypotension, hyperlipidemia, T2DM, breast cancer s/p right mastectomy chemo without radiation 32 years ago, renal cell carcinoma on the right s/p tumor removal, bladder cancer s/p PE BCG trial of CellCept now cancer free, respiratory failure with hypoxia secondary to PE 12/2021, and GERD who presents to the clinic today to discuss hypotension.  Past Medical History    Past Medical History:  Diagnosis Date   Allergy    SEASONAL   Anemia    "YEAR IN HALF AGO "   Anxiety    Arthritis    OSTEOARTHRITIS   Biceps tendinitis    Bladder cancer ([place]) 2014   Breast cancer ([place]) 1990   Cataract    [place] 2 YEARS AGO UPDATED 05/03/22   Chronic diastolic CHF (congestive heart failure) (HCC)    Cobalamin deficiency    Coronary arteriosclerosis in native artery    Diabetes mellitus without complication ([place])    "YEARS AGO WHEN i WAS OVER WEIGHTBUT NOT NOW" UPDATED 05/03/22   Elevated liver function tests    GERD (gastroesophageal reflux disease)    "A LITTLE"   Heart murmur    Hiatal hernia    Hyperlipidemia    Hypertension    Migraine    Myocardial infarction ([place])    Rotator cuff syndrome    Sleep apnea    "YES BUT RESOLVED",UPDATED 05/03/22   Transitional cell carcinoma, 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[place])    Tumor    "ON KIDNEY,REMOVED NO PROBLEMS SINCE",2014,RIGHT KIDNEY   Vitamin D deficiency    Past Surgical History:  Procedure Laterality Date   BREAST SURGERY     CARDIAC CATHETERIZATION     CATARACT EXTRACTION     CHOLECYSTECTOMY  1979   DG  BONE DENSITY ([place] [facility])     ENDOMETRIAL ABLATION     GASTRIC BYPASS     MASTECTOMY     OTHER  SURGICAL HISTORY     CANCER SURGERY   TOTAL HIP ARTHROPLASTY Left 09/06/2021   Procedure: TOTAL HIP ARTHROPLASTY;  Surgeon: Willaim Sheng, MD;  Location: [facility];  Service: Orthopedics;  Laterality: Left;    Allergies  Allergies  Allergen Reactions   Contrast Media [Iodinated Contrast Media] Anaphylaxis and Hives   Ciprofloxacin Other (See Comments)    Interaction w other medicine   Erythromycin Base Nausea And Vomiting   Sulfabenzamide Other (See Comments)    UNK reaction    History of Present Illness    Heather Garrett has a past medical history of: CAD. [place] 2001: BMS to LAD. Stress test 08/13/2021: Findings consistent with prior myocardial infarction.  The study is intermediate risk.  Fixed defect in the mid to apical anterior, anteroseptal, and apical segment.  Not worsened with stress.  Wall motion mildly abnormal in this area.  Suggests prior infarct, no evidence of ischemia. Chronic diastolic CHF.   Echo 09/24/2020: EF 50 to 55%.  Grade I DD.  Moderate left atrial enlargement.  Aortic valve sclerosis without stenosis.  Trace mitral valve regurgitation.  Mild tricuspid valve regurgitation. Echo 12/10/2021: EF 55 to 60%.  Mild LVH.  Grade I DD.  Moderately elevated pulmonary artery systolic pressure.  Mild to  moderate tricuspid valve regurgitation.  Aortic valve sclerosis/calcification without stenosis. Exacerbation complicated by respiratory failure with hypoxemia secondary to PE June 2023.  She was diuresed during hospital admission.   Hypertension.   Orthostatic hypotension.   Hyperlipidemia.   Last lipid panel 02/09/2022: LDL 52, HDL 155, triglycerides 86, total cholesterol 221. Respiratory failure with hypoxemia secondary to PE.  VQ scan 12/10/2021: Perfusion defect RUL consistent with acute PE. Completed 7-monthcourse of apixaban. Follow-up echo scheduled for 09/20/2022. GERD. Chronic migraines. Controlled with daily Qulipta and rescue Ubrelvy. T2DM.   Last hemoglobin A1c  02/09/2022: 5.1. Breast cancer. 1991: S/p right mastectomy.  Chemo without radiation. Renal cell carcinoma. Removal of tumor right kidney. Bladder cancer. Treated with BCG and trial of CellCept.  Now cancer free. History of falls. Fall in March 2023 resulting in left hip fracture with total hip replacement. Following June 2023 resulting in hairline fracture of left hip.  Wheelchair-bound since that time with ability to stand for transferring only. Follow-up with Ortho November 2023 with hope of clearance for physical therapy.  Patient was last seen in follow-up on 04/27/2022 by Dr. PJohney Frame  All medications were continued at that time.  She completed her course of apixaban for PE.  Plan for repeat echocardiogram prior to next visit in 6 months to follow pulmonary pressure.  Today, patient reports she initially scheduled this appointment to discuss hypotension.  However, her blood pressure has been high to normal for the last 2 days.  Patient produces a home BP log starting 04/24/2022 that shows BPs ranging from 180/110 to 95/60 with normotensive readings as well.  Hypertensive readings are typically reported when she checks her BP prior to taking her medications.  She reports she recently got a new BP cuff at the advice of her PCP.  She reports heart rate is consistently in the 70s.  She states that 2 to 3-days when her blood pressure was in the 90/60 range she did feel dizzy and lightheaded. This seemed to be a small percentage of her overall BP log, localized to only a few days. Patient is wheelchair-bound and only stands to transfer.  She denies onset of dizziness or lightheadedness with standing.  She reports increased fatigue.  She did stop her iron 1 week ago in anticipation for colonoscopy today.  When she contacted GI to report low blood pressures they opted to reschedule her colonoscopy.  She denies palpitations, shortness of breath or lower extremity edema.  She has not had a need to take her  HCTZ in several months.  She reports in the last 2 weeks 1-2 episodes of sharp central chest pain lasting seconds and resolving on its own.  No chest pain or shortness of breath with standing.  She reports taking tizanidine at bedtime.  She drinks approximately 2 glasses of wine each night.  She denies blood in her stool or urine or other bleeding concerns.  She believes she may have a UTI and plans to see her PCP or go to an urgent care for that.  She denies unusual headaches or new vision changes with high BP readings  She has a history of chronic migraines.  They are well controlled with daily [place] a rescue medication.  She has not had to take the Ubrelvy in 2 months.  She reports she normally wakes up with a headache every day but this is unchanged for several years.   Home Medications    Current Meds  Medication Sig  alendronate (FOSAMAX) 70 MG tablet 70 mg once a week.   amLODipine (NORVASC) 2.5 MG tablet Take 1 tablet (2.5 mg total) by mouth daily.   aspirin 81 MG EC tablet Take 1 tablet (81 mg total) by mouth every other day. Swallow whole.  Hold aspirin while taking eliquis, (Patient taking differently: Take 81 mg by mouth daily. Swallow whole.  Hold aspirin while taking eliquis,)   Atogepant (QULIPTA) 60 MG TABS Take 60 mg by mouth daily.   carvedilol (COREG) 3.125 MG tablet Take 1 tablet (3.125 mg total) by mouth 2 (two) times daily. Hold if sbp less than 100 or heart rate less than 50   celecoxib (CELEBREX) 100 MG capsule Take 100 mg by mouth 2 (two) times daily.   ezetimibe-simvastatin (VYTORIN) 10-80 MG tablet Take 1 tablet by mouth at bedtime.   famotidine (PEPCID) 40 MG tablet Take 1 tablet (40 mg total) by mouth as needed.   ferrous sulfate 325 (65 FE) MG EC tablet Take 1 tablet (325 mg total) by mouth every Monday, Wednesday, and Friday.   folic acid (FOLVITE) 1 MG tablet Take 1 tablet (1 mg total) by mouth daily.   hydrochlorothiazide (HYDRODIURIL) 25 MG  tablet Take 1 tablet (25 mg total) by mouth daily as needed (swelling/fluid).   nystatin-triamcinolone ointment (MYCOLOG) 1 Topical Daily   pantoprazole (PROTONIX) 40 MG tablet Take 1 tablet (40 mg total) by mouth 2 (two) times daily before a meal.   polyethylene glycol (MIRALAX / GLYCOLAX) 17 g packet Take 17 g by mouth daily. (Patient taking differently: Take 17 g by mouth as needed.)   Polyethylene Glycol 400 (BLINK TEARS) 0.25 % SOLN Place 1 drop into both eyes daily as needed (dry eyes).   senna-docusate (SENOKOT-S) 8.6-50 MG tablet Take 1 tablet by mouth at bedtime.   sertraline (ZOLOFT) 100 MG tablet Take 1 tablet (100 mg total) by mouth daily.   thiamine 100 MG tablet Take 1 tablet (100 mg total) by mouth daily.   tiZANidine (ZANAFLEX) 4 MG tablet Take 1 tablet (4 mg total) by mouth at bedtime.   Ubrogepant (UBRELVY) 100 MG TABS Take 100 mg by mouth daily as needed (migraine headaches). Can repeat x1 after 2 hours if needed. Max dose '200mg'$ /24 hours   vitamin B-12 (CYANOCOBALAMIN) 1000 MCG tablet Take 1,000 mcg by mouth daily.   Vitamin D, Ergocalciferol, (DRISDOL) 1.25 MG (50000 UNIT) CAPS capsule Take 1 capsule (50,000 Units total) by mouth every 7 (seven) days.    Family History    Family History  Problem Relation Age of Onset   Congestive Heart Failure Mother    Coronary artery disease Father    Heart attack Father 75       cause of death.   Diabetes Mellitus II Sister    Hypertension Sister    Breast cancer Sister    Migraines Neg Hx    Colon cancer Neg Hx    Esophageal cancer Neg Hx    Stomach cancer Neg Hx    Colon polyps Neg Hx    Crohn's disease Neg Hx    Rectal cancer Neg Hx    Ulcerative colitis Neg Hx    She indicated that her mother is deceased. She indicated that her father is deceased. She indicated that her sister is alive. She indicated that her brother is alive. She indicated that the status of her neg hx is unknown.   Social History    Social History    Socioeconomic History  Marital status: Married    Spouse name: Not on file   Number of children: Not on file   Years of education: Not on file   Highest education level: Not on file  Occupational History   Not on file  Tobacco Use   Smoking status: Former    Passive exposure: Never   Smokeless tobacco: Never   Tobacco comments:    Quit 30 yrs ago  Vaping Use   Vaping Use: Never used  Substance and Sexual Activity   Alcohol use: Yes    Alcohol/week: 14.0 standard drinks of alcohol    Types: 14 Glasses of wine per week    Comment: daily, 2glasses wine daily   Drug use: Never   Sexual activity: Not on file  Other Topics Concern   Not on file  Social History Narrative   Lives with husband   Social Determinants of Health   Financial Resource Strain: Not on file  Food Insecurity: Not on file  Transportation Needs: Not on file  Physical Activity: Not on file  Stress: Not on file  Social Connections: Not on file  Intimate Partner Violence: Not on file     Review of Systems    General: No chills, fever, night sweats or weight changes.  Cardiovascular:  No chest pain, dyspnea on exertion, edema, orthopnea, palpitations, paroxysmal nocturnal dyspnea. Dermatological: No rash, lesions/masses Respiratory: No cough, dyspnea Urologic: No hematuria, dysuria Abdominal:   No nausea, vomiting, diarrhea, bright red blood per rectum, melena, or hematemesis Neurologic:  No visual changes, weakness, changes in mental status. All other systems reviewed and are otherwise negative except as noted above.  Physical Exam    VS:  BP 128/68   Pulse 89   Ht '4\' 8"'$  (1.422 m)   Wt 117 lb (53.1 kg)   SpO2 95%   BMI 26.23 kg/m  , BMI Body mass index is 26.23 kg/m. GEN: Well nourished, well developed, in no acute distress. HEENT: Normal. Neck: Supple, no JVD, carotid bruits, or masses. Cardiac: RRR, no murmurs, rubs, or gallops. No clubbing, cyanosis, edema.  Radials/DP/PT 2+ and equal  bilaterally.  Respiratory:  Respirations regular and unlabored, clear to auscultation bilaterally. GI: Soft, nontender, nondistended. MS: No deformity or atrophy.  Patient is wheelchair bound the ability to stand for transferring. Skin: Warm and dry, no rash. Neuro: Strength and sensation are intact. Psych: Normal affect.  Accessory Clinical Findings    Recent Labs: 12/15/2021: ALT 13; B Natriuretic Peptide 144.3 12/17/2021: Magnesium 2.5 02/09/2022: BUN 11; Creatinine, Ser 0.77; Hemoglobin 11.0; Platelets 329; Potassium 5.7; Sodium 142; TSH 2.570   Recent Lipid Panel    Component Value Date/Time   CHOL 221 (H) 02/09/2022 0807   TRIG 86 02/09/2022 0807   HDL 155 02/09/2022 0807   CHOLHDL 1.4 02/09/2022 0807   LDLCALC 52 02/09/2022 0807       ECG personally reviewed by me today normal sinus rhythm, LVH with QRS widening and repolarization abnormality, rate 88.  No significant changes from 02/03/2022.    Assessment & Plan   Labile hypertension.  Home BP log from 04/24/2022 to present shows BPs ranging from 180/110 to 95/60 with normotensive readings as well.  Many of the her hypertensive readings occur prior to taking medications.  She reports some dizziness/lightheaded this with readings in the 90s over 60s that has not positional secondary to her being wheelchair-bound. This seemed to have been clustered around 2-3 days, with otherwise largely normal to hypertensive values. She is  able to stand for transferring but denies dizziness or syncope with standing.  She denies unusual headaches or new vision changes.  She is currently taking amlodipine 2.5 mg daily and carvedilol 3.125 mg twice a day.  She  has not taken HCTZ 25 mg for 2 to 3 months secondary to absence of lower extremity edema.  Given the labile nature of her hypertension/hypotension will not change medications at this time.  We will check a CBC, BMP and TSH.  We will get a complete echo.  She is provided with a BP log and is  instructed to take her BP once a day 2 to 3 hours after taking her medications.  If she experiences an unusual headache or episodes of dizziness she will check her BP at that time.  She obtained a new BP cuff recently which she did not bring with her today.  Her daughter is a Marine scientist so she will have her calibrate her home BP cuff with a manual BP reading. CAD.  Stress test 08/13/2021 showed intermediate risk with no evidence of ischemia.  She denies chest pain or shortness of breath.  She has been wheelchair-bound since June 2023 secondary to hairline fracture of her left hip occurring 3 months after total hip replacement.  She has the ability to stand for transferring.  She is scheduled to see orthopedics next week and will hopefully be given the go ahead to start physical therapy. It is noted that she is on Vytorin with simvastatin dose of '80mg'$ , which is not ideal given concomitant amlodipine use. Max dose recommended by FDA while on amlodipine is '20mg'$  daily. This is prescribed by PCP. Will reach out to primary care to inquire if they would be agreeable to switching the simvastatin component to rosuvastatin or atorvastatin.  Chronic diastolic CHF.  Echo 12/10/2021 showed an EF of 55 to 60%, mild LVH, Grade I DD, moderately elevated pulmonary artery systolic pressure, mild to moderate tricuspid valve regurgitation, and aortic valve sclerosis/calcification without stenosis.  He had an exacerbation of her CHF which was complicated by respiratory failure secondary to PE.  She denies lower extremity edema or the need to take HCTZ in 2 to 3 months.  She is euvolemic today. Respiratory failure with hypoxemia/history of PE.  VQ scan 12/10/2021 showed perfusion defect RUL consistent with acute PE.  She completed a 39-monthcourse of apixaban.  She was originally scheduled for a limited echo on 09/20/2022 to reevaluate pulmonary hypertension.  We will obtain a complete echo sooner. History of falls.  Patient fell from March 2023  resulting in a left hip fracture with total hip replacement.  She then fell again in June 2023 resulting in a hairline fracture of her left hip.  She has been wheelchair-bound since with the ability to stand for transferring only.  She has an appointment with orthopedics in a week and is hoping she will be cleared for physical therapy.  No falls since June.  Disposition: CBC, BMP, TSH today.  Complete echo.  BP log provided.  Follow-up 3 to 4 weeks with APP or Dr. PJohney Frameor sooner as needed.   DJustice Britain Jery Hollern, NP-C     05/17/2022, 2:09 PM [id number] Office ([phone] ([phone]  I spent 15 minutes examining this patient, reviewing medications, and using patient centered shared decision making involving her cardiac care.  Prior to her visit I spent greater than 20 minutes reviewing her past medical history,  medications, and prior cardiac tests.

## 2022-05-17 ENCOUNTER — Ambulatory Visit: Payer: Medicare PPO | Attending: Physician Assistant | Admitting: Student

## 2022-05-17 ENCOUNTER — Telehealth: Payer: Self-pay | Admitting: Gastroenterology

## 2022-05-17 ENCOUNTER — Encounter: Payer: Self-pay | Admitting: Student

## 2022-05-17 ENCOUNTER — Encounter: Payer: Medicare PPO | Admitting: Gastroenterology

## 2022-05-17 VITALS — BP 128/68 | HR 89 | Ht <= 58 in | Wt 117.0 lb

## 2022-05-17 DIAGNOSIS — I2510 Atherosclerotic heart disease of native coronary artery without angina pectoris: Secondary | ICD-10-CM

## 2022-05-17 DIAGNOSIS — Z8709 Personal history of other diseases of the respiratory system: Secondary | ICD-10-CM

## 2022-05-17 DIAGNOSIS — R0989 Other specified symptoms and signs involving the circulatory and respiratory systems: Secondary | ICD-10-CM | POA: Diagnosis not present

## 2022-05-17 DIAGNOSIS — I5032 Chronic diastolic (congestive) heart failure: Secondary | ICD-10-CM

## 2022-05-17 DIAGNOSIS — Z8669 Personal history of other diseases of the nervous system and sense organs: Secondary | ICD-10-CM | POA: Diagnosis not present

## 2022-05-17 DIAGNOSIS — Z86711 Personal history of pulmonary embolism: Secondary | ICD-10-CM | POA: Diagnosis not present

## 2022-05-17 DIAGNOSIS — Z9181 History of falling: Secondary | ICD-10-CM | POA: Diagnosis not present

## 2022-05-17 NOTE — Patient Instructions (Addendum)
Medication Instructions:   Your physician recommends that you continue on your current medications as directed. Please refer to the Current Medication list given to you today.  *If you need a refill on your cardiac medications before your next appointment, please call your pharmacy*   Lab Work: BMET CBC AND TSH TODAY    If you have labs (blood work) drawn today and your tests are completely normal, you will receive your results only by: [place] (if you have MyChart) OR A paper copy in the mail If you have any lab test that is abnormal or we need to change your treatment, we will call you to review the results.   Testing/Procedures:Your physician has requested that you have an echocardiogram. Echocardiography is a painless test that uses sound waves to create images of your heart. It provides your doctor with information about the size and shape of your heart and how well your heart's chambers and valves are working. This procedure takes approximately one hour. There are no restrictions for this procedure. Please do NOT wear cologne, perfume, aftershave, or lotions (deodorant is allowed). Please arrive 15 minutes prior to your appointment time.     Follow-Up: At [facility], you and your health needs are our priority.  As part of our continuing mission to provide you with exceptional heart care, we have created designated Provider Care Teams.  These Care Teams include your primary Cardiologist (physician) and Advanced Practice Providers (APPs -  Physician Assistants and Nurse Practitioners) who all work together to provide you with the care you need, when you need it.  We recommend signing up for the patient portal called "MyChart".  Sign up information is provided on this After Visit Summary.  MyChart is used to connect with patients for Virtual Visits (Telemedicine).  Patients are able to view lab/test results, encounter notes, upcoming appointments, etc.  Non-urgent  messages can be sent to your provider as well.   To learn more about what you can do with MyChart, go to [email or web address].    Your next appointment:   3 -4 week(s)  The format for your next appointment:   In Person  Provider:   Melina Copa, PA-C or ,Dr. Johney Frame   Other Instructions  PLEASE TAKE BLOOD PRESSURE LOG AND TAKE BLOOD PRESSURE 2-3 HOURS AFTER MEDICINES.  PLEASE GET YOUR DAUGHTER TO CHECK HOME BLOOD PRESSURE AGAINST MANUAL BLOOD PRESSURE    Important Information About Sugar

## 2022-05-17 NOTE — Telephone Encounter (Signed)
Good Morning  Dr. [name],  I called this patient today and she stated she had called on Saturday and talked with after hour service and she stated her blood pressure was very low and she needed to cancel procedure.  She has an appointment with her Cardiologist today.

## 2022-05-18 LAB — BASIC METABOLIC PANEL
BUN/Creatinine Ratio: 10 — ABNORMAL LOW (ref 12–28)
BUN: 8 mg/dL (ref 8–27)
CO2: 23 mmol/L (ref 20–29)
Calcium: 9.2 mg/dL (ref 8.7–10.3)
Chloride: 98 mmol/L (ref 96–106)
Creatinine, Ser: 0.83 mg/dL (ref 0.57–1.00)
Glucose: 95 mg/dL (ref 70–99)
Potassium: 4.4 mmol/L (ref 3.5–5.2)
Sodium: 137 mmol/L (ref 134–144)
eGFR: 75 mL/min/{1.73_m2} (ref >59)

## 2022-05-18 LAB — CBC
Hematocrit: 33.3 % — ABNORMAL LOW (ref 34.0–46.6)
Hemoglobin: 11.6 g/dL (ref 11.1–15.9)
MCH: 34.8 pg — ABNORMAL HIGH (ref 26.6–33.0)
MCHC: 34.8 g/dL (ref 31.5–35.7)
MCV: 100 fL — ABNORMAL HIGH (ref 79–97)
Platelets: 309 10*3/uL (ref 150–450)
RBC: 3.33 x10E6/uL — ABNORMAL LOW (ref 3.77–5.28)
RDW: 14.6 % (ref 11.7–15.4)
WBC: 10.2 10*3/uL (ref 3.4–10.8)

## 2022-05-18 LAB — TSH: TSH: 4.29 u[IU]/mL (ref 0.450–4.500)

## 2022-05-19 DIAGNOSIS — S72002D Fracture of unspecified part of neck of left femur, subsequent encounter for closed fracture with routine healing: Secondary | ICD-10-CM | POA: Diagnosis not present

## 2022-05-20 ENCOUNTER — Other Ambulatory Visit: Payer: Self-pay | Admitting: Family Medicine

## 2022-05-20 DIAGNOSIS — E7849 Other hyperlipidemia: Secondary | ICD-10-CM

## 2022-05-20 MED ORDER — SIMVASTATIN 20 MG PO TABS: 20.0000 mg | ORAL_TABLET | Freq: Every day | ORAL | 3 refills | Status: DC

## 2022-05-20 MED ORDER — ATORVASTATIN CALCIUM 40 MG PO TABS: 40.0000 mg | ORAL_TABLET | Freq: Every day | ORAL | 3 refills | Status: DC

## 2022-05-20 MED ORDER — EZETIMIBE 10 MG PO TABS: 10.0000 mg | ORAL_TABLET | Freq: Every day | ORAL | 3 refills | Status: DC

## 2022-05-20 NOTE — Progress Notes (Addendum)
  I spoke with the patient and informed her to start taking simvastatin 20 mg and ezetimibe 10 mg daily. I told the patient that her medications were sent to her pharmacy to pick up and start therapy. Encouraged to d/c Vytorin 10-80

## 2022-05-25 ENCOUNTER — Telehealth: Payer: Self-pay | Admitting: Student

## 2022-05-25 NOTE — Telephone Encounter (Signed)
Spoke with patient and discussed recommendation from Mayra Reel, NP:  Please let patient know I think it is reasonable to take amlodipine in the evenings. She should still take her carvedilol twice a day following the previous parameters of holding with SBP <100 and HR <50. Continue keeping BP log 1-2 hours after medications to bring to December follow-up with Dr. Johney Frame.    Patient verbalized understanding of the above and expressed appreciation for follow-up.

## 2022-05-25 NOTE — Telephone Encounter (Signed)
Returned call to patient.  Patient reports BP this morning is 102/64, HR 55. She reports BP is higher in the evenings, last night was 134/77.  Patient states she was prescribed Amlodipine 2.'5mg'$  QD, she just picked this up from her pharmacy a couple days ago and has not yet taken any. She is concerned about taking this medication with her AM BP readings. She is asking if it would be better for her to take Amlodipine at night.  Will forward to Mayra Reel, NP to review and advise.

## 2022-05-25 NOTE — Telephone Encounter (Signed)
Please let patient know I think it is reasonable to take amlodipine in the evenings. She should still take her carvedilol twice a day following the previous parameters of holding with SBP <100 and HR <50. Continue keeping BP log 1-2 hours after medications to bring to December follow-up with Dr. Johney Frame.  Thank you!

## 2022-05-25 NOTE — Telephone Encounter (Signed)
  Pt would like to speak with NP Neoma Laming regarding her new prescription, she couldn't remember the name she said its the one replaced her lisinopril, also, her BP been running low

## 2022-05-26 ENCOUNTER — Telehealth: Payer: Self-pay | Admitting: Cardiology

## 2022-05-26 DIAGNOSIS — I10 Essential (primary) hypertension: Secondary | ICD-10-CM

## 2022-05-26 MED ORDER — AMLODIPINE BESYLATE 2.5 MG PO TABS: 2.5000 mg | ORAL_TABLET | Freq: Every day | ORAL | 3 refills | Status: DC

## 2022-05-26 NOTE — Telephone Encounter (Signed)
Pt's medication was sent to pt's pharmacy as requested. Confirmation received.  °

## 2022-05-26 NOTE — Telephone Encounter (Signed)
Pt is requesting a refill on amlodipine. This medication was refilled by PCP. Would Dr. Johney Frame like to refill this medication? Please address

## 2022-05-26 NOTE — Telephone Encounter (Signed)
*  STAT* If patient is at the pharmacy, call can be transferred to refill team.   1. Which medications need to be refilled? (please list name of each medication and dose if known) amLODipine (NORVASC) 2.5 MG tablet   2. Which pharmacy/location (including street and city if local pharmacy) is medication to be sent to? [facility]   3. Do they need a 30 day or 90 day supply? [place]

## 2022-06-07 ENCOUNTER — Telehealth: Payer: Self-pay | Admitting: Family Medicine

## 2022-06-07 ENCOUNTER — Other Ambulatory Visit: Payer: Self-pay

## 2022-06-07 DIAGNOSIS — I10 Essential (primary) hypertension: Secondary | ICD-10-CM

## 2022-06-07 DIAGNOSIS — R1013 Epigastric pain: Secondary | ICD-10-CM

## 2022-06-07 MED ORDER — HYDROCHLOROTHIAZIDE 25 MG PO TABS: 25.0000 mg | ORAL_TABLET | Freq: Every day | ORAL | 2 refills | Status: DC | PRN

## 2022-06-07 MED ORDER — PANTOPRAZOLE SODIUM 40 MG PO TBEC: 40.0000 mg | DELAYED_RELEASE_TABLET | Freq: Two times a day (BID) | ORAL | 2 refills | Status: DC

## 2022-06-07 NOTE — Telephone Encounter (Signed)
  Prescription Request  06/07/2022  Is this a "Controlled Substance" medicine? No  LOV: 04/06/2022   What is the name of the medication or equipment?  hydrochlorothiazide (HYDRODIURIL) 25 MG tablet   pantoprazole (PROTONIX) 40 MG tablet   Have you contacted your pharmacy to request a refill? Yes   Which pharmacy would you like this sent to?  [place], [place] [address] [place] [address] Phone: [phone] Fax: [phone]   Patient notified that their request is being sent to the clinical staff for review and that they should receive a response within 2 business days.   Please advise at [phone]

## 2022-06-07 NOTE — Telephone Encounter (Signed)
Refills sent

## 2022-06-08 ENCOUNTER — Ambulatory Visit (INDEPENDENT_AMBULATORY_CARE_PROVIDER_SITE_OTHER): Payer: Medicare PPO | Admitting: Neurology

## 2022-06-08 ENCOUNTER — Encounter: Payer: Self-pay | Admitting: Neurology

## 2022-06-08 VITALS — BP 119/85 | HR 119 | Ht <= 58 in | Wt 116.2 lb

## 2022-06-08 DIAGNOSIS — R0681 Apnea, not elsewhere classified: Secondary | ICD-10-CM

## 2022-06-08 DIAGNOSIS — G4719 Other hypersomnia: Secondary | ICD-10-CM

## 2022-06-08 DIAGNOSIS — R634 Abnormal weight loss: Secondary | ICD-10-CM

## 2022-06-08 DIAGNOSIS — Z8669 Personal history of other diseases of the nervous system and sense organs: Secondary | ICD-10-CM

## 2022-06-08 DIAGNOSIS — R0683 Snoring: Secondary | ICD-10-CM

## 2022-06-08 DIAGNOSIS — Z9884 Bariatric surgery status: Secondary | ICD-10-CM | POA: Diagnosis not present

## 2022-06-08 DIAGNOSIS — R351 Nocturia: Secondary | ICD-10-CM

## 2022-06-08 DIAGNOSIS — R519 Headache, unspecified: Secondary | ICD-10-CM | POA: Diagnosis not present

## 2022-06-08 NOTE — Progress Notes (Signed)
Subjective:    Patient ID: Heather Garrett is a 71 y.o. female.  HPI    Star Age, MD, PhD [facility] [address], [address] [place], [place] [address]  Dear Janett Billow and Berta Minor,  I saw your patient, Heather Garrett, on your kind request in my sleep clinic today for initial consultation of her sleep disorder, in particular, concern for underlying obstructive sleep apnea.  The patient is unaccompanied today.  As you know, Heather Garrett is a 71 year old female with an underlying medical history of hypertension, congestive heart failure, hyperlipidemia, history of myocardial infarction, history of bladder cancer, history of breast cancer with status postmastectomy, arthritis with status post left total hip arthroplasty, vitamin D deficiency, and borderline overweight state, with status post gastric bypass surgery in 2010, who reports morning headaches, nocturia, and daytime somnolence, as well as witnessed apneas per husband.  She was diagnosed with obstructive sleep apnea in or around 2010.  She had a CPAP machine but after her weight loss surgery and losing about 125 pounds altogether she did not use her CPAP any longer.  She is situated in a wheelchair today.  She has limited mobility, she has had left hip pain.  She lives with her husband. I reviewed your office note from 05/02/2022.  Her Epworth sleepiness score is 10 out of 24, fatigue severity score is 51 out of 63.  She has a history of bruxism and could not tolerate a bite guard.  Bedtime is generally between 9:30 PM and 10 PM and rise time around 6:30 AM.  She is not aware of any family history of sleep apnea.  She drinks caffeine in the form of coffee, typically 1 cup in the morning, she does drink alcohol daily, about 2 glasses of wine daily, about 4 ounce size each.  She quit smoking some 30 years ago.  She had a tonsillectomy as a teenager.  She would prefer a home sleep test as she has limited  mobility.  She denies any headache currently.  She has nocturia about once per average night.  Her Past Medical History Is Significant For: Past Medical History:  Diagnosis Date   Allergy    SEASONAL   Anemia    "YEAR IN HALF AGO "   Anxiety    Arthritis    OSTEOARTHRITIS   Biceps tendinitis    Bladder cancer ([place]) 2014   Breast cancer ([place]) 1990   Cataract    [place] 2 YEARS AGO UPDATED 05/03/22   Chronic diastolic CHF (congestive heart failure) (HCC)    Cobalamin deficiency    Coronary arteriosclerosis in native artery    Diabetes mellitus without complication ([place])    "YEARS AGO WHEN i WAS OVER WEIGHTBUT NOT NOW" UPDATED 05/03/22   Elevated liver function tests    GERD (gastroesophageal reflux disease)    "A LITTLE"   Heart murmur    Hiatal hernia    Hyperlipidemia    Hypertension    Migraine    Myocardial infarction [facility])    Rotator cuff syndrome    Sleep apnea    "YES BUT RESOLVED",UPDATED 05/03/22   Transitional cell carcinoma, 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[place])    Tumor    "ON KIDNEY,REMOVED NO PROBLEMS SINCE",2014,RIGHT KIDNEY   Vitamin D deficiency     Her Past Surgical History Is Significant For: Past Surgical History:  Procedure Laterality Date   BREAST SURGERY     CARDIAC CATHETERIZATION     CATARACT EXTRACTION  CHOLECYSTECTOMY  1979   DG  BONE DENSITY ([facility] HX)     ENDOMETRIAL ABLATION     GASTRIC BYPASS     MASTECTOMY     OTHER SURGICAL HISTORY     CANCER SURGERY   TOTAL HIP ARTHROPLASTY Left 09/06/2021   Procedure: TOTAL HIP ARTHROPLASTY;  Surgeon: Willaim Sheng, MD;  Location: [facility];  Service: Orthopedics;  Laterality: Left;    Her Family History Is Significant For: Family History  Problem Relation Age of Onset   Congestive Heart Failure Mother    Coronary artery disease Father    Heart attack Father 59       cause of death.   Diabetes Mellitus II Sister    Hypertension Sister    Breast cancer Sister    Migraines Neg Hx    Colon  cancer Neg Hx    Esophageal cancer Neg Hx    Stomach cancer Neg Hx    Colon polyps Neg Hx    Crohn's disease Neg Hx    Rectal cancer Neg Hx    Ulcerative colitis Neg Hx     Her Social History Is Significant For: Social History   Socioeconomic History   Marital status: Married    Spouse name: Not on file   Number of children: Not on file   Years of education: Not on file   Highest education level: Not on file  Occupational History   Not on file  Tobacco Use   Smoking status: Former    Passive exposure: Never   Smokeless tobacco: Never   Tobacco comments:    Quit 30 yrs ago  Vaping Use   Vaping Use: Never used  Substance and Sexual Activity   Alcohol use: Yes    Alcohol/week: 14.0 standard drinks of alcohol    Types: 14 Glasses of wine per week    Comment: daily, 2glasses wine daily   Drug use: Never   Sexual activity: Not on file  Other Topics Concern   Not on file  Social History Narrative   Lives with husband in town home.   Caffiene coffee 1 cup, occasional sweet tea (rare)      Social Determinants of Health   Financial Resource Strain: Not on file  Food Insecurity: Not on file  Transportation Needs: Not on file  Physical Activity: Not on file  Stress: Not on file  Social Connections: Not on file    Her Allergies Are:  Allergies  Allergen Reactions   Contrast Media [Iodinated Contrast Media] Anaphylaxis and Hives   Ciprofloxacin Other (See Comments)    Interaction w other medicine   Erythromycin Base Nausea And Vomiting   Sulfabenzamide Other (See Comments)    UNK reaction  :   Her Current Medications Are:  Outpatient Encounter Medications as of 06/08/2022  Medication Sig   alendronate (FOSAMAX) 70 MG tablet 70 mg once a week.   amLODipine (NORVASC) 2.5 MG tablet Take 1 tablet (2.5 mg total) by mouth daily.   aspirin 81 MG EC tablet Take 1 tablet (81 mg total) by mouth every other day. Swallow whole.  Hold aspirin while taking eliquis, (Patient  taking differently: Take 81 mg by mouth daily. Swallow whole.  Hold aspirin while taking eliquis,)   Atogepant (QULIPTA) 60 MG TABS Take 60 mg by mouth daily.   carvedilol (COREG) 3.125 MG tablet Take 1 tablet (3.125 mg total) by mouth 2 (two) times daily. Hold if sbp less than 100 or heart rate less than  50   celecoxib (CELEBREX) 100 MG capsule Take 100 mg by mouth 2 (two) times daily.   ezetimibe (ZETIA) 10 MG tablet Take 1 tablet (10 mg total) by mouth daily.   famotidine (PEPCID) 40 MG tablet Take 1 tablet (40 mg total) by mouth as needed.   ferrous sulfate 325 (65 FE) MG EC tablet Take 1 tablet (325 mg total) by mouth every Monday, Wednesday, and Friday.   folic acid (FOLVITE) 1 MG tablet Take 1 tablet (1 mg total) by mouth daily.   hydrochlorothiazide (HYDRODIURIL) 25 MG tablet Take 1 tablet (25 mg total) by mouth daily as needed (swelling/fluid).   nystatin-triamcinolone ointment (MYCOLOG) 1 Topical Daily   pantoprazole (PROTONIX) 40 MG tablet Take 1 tablet (40 mg total) by mouth 2 (two) times daily before a meal.   Polyethylene Glycol 400 (BLINK TEARS) 0.25 % SOLN Place 1 drop into both eyes daily as needed (dry eyes).   senna-docusate (SENOKOT-S) 8.6-50 MG tablet Take 1 tablet by mouth at bedtime.   sertraline (ZOLOFT) 100 MG tablet Take 1 tablet (100 mg total) by mouth daily.   simvastatin (ZOCOR) 20 MG tablet Take 1 tablet (20 mg total) by mouth at bedtime.   tiZANidine (ZANAFLEX) 4 MG tablet Take 1 tablet (4 mg total) by mouth at bedtime.   Ubrogepant (UBRELVY) 100 MG TABS Take 100 mg by mouth daily as needed (migraine headaches). Can repeat x1 after 2 hours if needed. Max dose '200mg'$ /24 hours   trimethobenzamide (TIGAN) 300 MG capsule Take 1 capsule (300 mg total) by mouth 3 (three) times daily. (Patient not taking: Reported on 05/03/2022)   [DISCONTINUED] Cholecalciferol 25 MCG (1000 UT) tablet Take 1,000 Units by mouth daily. (Patient not taking: Reported on 05/03/2022)    [DISCONTINUED] polyethylene glycol (MIRALAX / GLYCOLAX) 17 g packet Take 17 g by mouth daily. (Patient taking differently: Take 17 g by mouth as needed.)   [DISCONTINUED] thiamine 100 MG tablet Take 1 tablet (100 mg total) by mouth daily.   [DISCONTINUED] vitamin B-12 (CYANOCOBALAMIN) 1000 MCG tablet Take 1,000 mcg by mouth daily.   [DISCONTINUED] Vitamin D, Ergocalciferol, (DRISDOL) 1.25 MG (50000 UNIT) CAPS capsule Take 1 capsule (50,000 Units total) by mouth every 7 (seven) days.   No facility-administered encounter medications on file as of 06/08/2022.  :   Review of Systems:  Out of a complete 14 point review of systems, all are reviewed and negative with the exception of these symptoms as listed below:  Review of Systems  Neurological:        Cardiology wanted her to have done.  Had SS and wore cpap 2010.  Lost weight and did not need.  Now fragmented sleep, some gasping.  Fatigue.  ESS 10, FSS 51. She mentioned having a fall and hitting back of head (Monday).  No ED, neighbors checked on her.      Objective:  Neurological Exam  Physical Exam Physical Examination:   Vitals:   06/08/22 0857  BP: 119/85  Pulse: (!) 119    General Examination: The patient is a very pleasant 71 y.o. female in no acute distress. She appears well-developed and well-nourished and well groomed.  Situated in her wheelchair.  HEENT: Normocephalic, atraumatic, pupils are equal, round and reactive to light, extraocular tracking is good without limitation to gaze excursion or nystagmus noted. Hearing is grossly intact. Face is symmetric with normal facial animation. Speech is clear with no dysarthria noted. There is no hypophonia. There is no lip, neck/head, jaw  or voice tremor. Neck is supple with full range of passive and active motion. There are no carotid bruits on auscultation. Oropharynx exam reveals: moderate mouth dryness, adequate dental hygiene and mild airway crowding, due to small airway entry,  tonsils are absent, Mallampati class II.  Neck circumference 14 three-quarter inches, minimal overbite. Tongue protrudes centrally and palate elevates symmetrically.  Nasal inspection reveals slightly deviated septum to the right.    Chest: Clear to auscultation without wheezing, rhonchi or crackles noted.  Heart: S1+S2+0, regular and normal without murmurs, rubs or gallops noted.   Abdomen: Soft, non-tender and non-distended.  Extremities: There is no pitting edema in the distal lower extremities bilaterally.   Skin: Warm and dry without trophic changes noted.   Musculoskeletal: exam reveals limited mobility left hip.    Neurologically:  Mental status: The patient is awake, alert and oriented in all 4 spheres. Her immediate and remote memory, attention, language skills and fund of knowledge are appropriate. There is no evidence of aphasia, agnosia, apraxia or anomia. Speech is clear with normal prosody and enunciation. Thought process is linear. Mood is normal and affect is normal.  Cranial nerves II - XII are as described above under HEENT exam.  Motor exam: Thin bulk, moving all 4 extremities but limited mobility in the left hip. There is no obvious action or resting tremor.  Fine motor skills and coordination: grossly intact.  Cerebellar testing: No dysmetria or intention tremor. There is no truncal or gait ataxia.  Sensory exam: intact to light touch in the upper and lower extremities.  Gait, station and balance: She is in her wheelchair, she did not bring her walker, I did not have her stand or walk for me today.  Assessment and Plan:  In summary, ARNETIA BRONK is a very pleasant 71 y.o.-year old female with an underlying medical history of hypertension, congestive heart failure, hyperlipidemia, history of myocardial infarction, history of bladder cancer, history of breast cancer with status postmastectomy, arthritis with status post left total hip arthroplasty, vitamin D  deficiency, and borderline overweight state, with status post gastric bypass surgery in 2010, who presents for evaluation of her sleep apnea.  She was diagnosed in 2010 prior to her weight loss surgery and had a CPAP machine at the time.  She has lost a significant amount of weight but does report snoring and witnessed apneas per husband's report.  She has had morning headaches.  She is advised to proceed with sleep testing for reevaluation of her sleep apnea.  She would prefer a home sleep test due to her mobility restrictions.  I will order a home sleep test, I did explain the differences between a laboratory attended sleep study versus home sleep test.   I had a long chat with the patient about my findings and the diagnosis of sleep apnea, particularly OSA, its prognosis and treatment options. We talked about medical/conservative treatments, surgical interventions and non-pharmacological approaches for symptom control. I explained, in particular, the risks and ramifications of untreated moderate to severe OSA, especially with respect to developing cardiovascular disease down the road, including congestive heart failure (CHF), difficult to treat hypertension, cardiac arrhythmias (particularly A-fib), neurovascular complications including TIA, stroke and dementia. Even type 2 diabetes has, in part, been linked to untreated OSA. Symptoms of untreated OSA may include (but may not be limited to) daytime sleepiness, nocturia (i.e. frequent nighttime urination), memory problems, mood irritability and suboptimally controlled or worsening mood disorder such as depression and/or anxiety, lack of  energy, lack of motivation, physical discomfort, as well as recurrent headaches, especially morning or nocturnal headaches. We talked about the importance of maintaining a healthy lifestyle and striving for healthy weight. In addition, we talked about the importance of striving for and maintaining good sleep hygiene.  In  particular, she is discouraged from drinking alcohol daily, especially 2 servings, she is advised that alcohol is not known sleep disrupter. I outlined possible surgical and non-surgical treatment options of OSA, including the use of a positive airway pressure (PAP) device (i.e. CPAP, AutoPAP/APAP or BiPAP in certain circumstances), a custom-made dental device (aka oral appliance, which would require a referral to a specialist dentist or orthodontist typically, and is generally speaking not considered for patients with full dentures or edentulous state), upper airway surgical options, such as traditional UPPP (which is not considered a first-line treatment) or the Inspire device (hypoglossal nerve stimulator, which would involve a referral for consultation with an ENT surgeon, after careful selection, following inclusion criteria - also not first-line treatment). She would be willing to try PAP therapy, if the need arises. I explained the importance of being compliant with PAP treatment, not only for insurance purposes but primarily to improve patient's symptoms symptoms, and for the patient's long term health benefit, including to reduce Her cardiovascular risks longer-term.    We will pick up our discussion about the next steps and treatment options after testing.  We will keep her posted as to the test results by phone call and/or MyChart messaging where possible.  We will plan to follow-up in sleep clinic accordingly as well.  I answered all her questions today and the patient was in agreement.   I encouraged her to call with any interim questions, concerns, problems or updates or email [place] through [facility].  Generally speaking, sleep test authorizations may take up to 2 weeks, sometimes less, sometimes longer, the patient is encouraged to get in touch with [place] if they do not hear back from the sleep lab staff directly within the next 2 weeks.  Thank you very much for allowing me to participate in the care  of this nice patient. If I can be of any further assistance to you please do not hesitate to talk to me.  Sincerely,   Star Age, MD, PhD

## 2022-06-08 NOTE — Patient Instructions (Signed)
Based on your symptoms and your exam I believe you are still at risk for obstructive sleep apnea or OSA. You had a prior diagnosis of OSA. After the test, we will reach out to you. I will likely recommend you start treatment with an autoPAP machine.   Please remember, the risks and ramifications of moderate to severe obstructive sleep apnea or OSA are: Cardiovascular disease, including congestive heart failure, stroke, difficult to control hypertension, arrhythmias, and even type 2 diabetes has been linked to untreated OSA. Sleep apnea causes disruption of sleep and sleep deprivation in most cases, which, in turn, can cause recurrent headaches, problems with memory, mood, concentration, focus, and vigilance. Most people with untreated sleep apnea report excessive daytime sleepiness, which can affect their ability to drive. Please do not drive if you feel sleepy.   We will do a home sleep test for your convenience. You will take equipment home for this and bring it back the next day for analysis.   I will plan to see you back after your sleep test. We will call you after your sleep study to advise about the results (most likely, you will hear from my nurse) and to set up an appointment at the time, as necessary.    Our sleep lab administrative assistant will call you to schedule your sleep equipment pick up so you or your family member can learn how to put the sensors on at night. If you don't hear back from her by next week please feel free to call her at [phone]. This is her direct line and please leave a message with your phone number to call back if you get the voicemail box. She will call back as soon as possible.

## 2022-06-09 ENCOUNTER — Other Ambulatory Visit: Payer: Self-pay

## 2022-06-09 ENCOUNTER — Ambulatory Visit (HOSPITAL_COMMUNITY): Payer: Medicare PPO | Attending: Cardiology

## 2022-06-09 ENCOUNTER — Other Ambulatory Visit: Payer: Self-pay | Admitting: Family Medicine

## 2022-06-09 DIAGNOSIS — I5032 Chronic diastolic (congestive) heart failure: Secondary | ICD-10-CM | POA: Diagnosis not present

## 2022-06-09 LAB — ECHOCARDIOGRAM COMPLETE
Area-P 1/2: 2.78 cm2
S' Lateral: 2.2 cm

## 2022-06-09 MED ORDER — PERFLUTREN LIPID MICROSPHERE
1.0000 mL | INTRAVENOUS | Status: AC | PRN
  Administered 2022-06-09: 2 mL via INTRAVENOUS

## 2022-06-09 MED ORDER — CARVEDILOL 3.125 MG PO TABS: 3.1250 mg | ORAL_TABLET | Freq: Two times a day (BID) | ORAL | 3 refills | Status: DC

## 2022-06-10 ENCOUNTER — Telehealth: Payer: Self-pay | Admitting: Student

## 2022-06-10 NOTE — Telephone Encounter (Signed)
  Mayra Reel, NP 06/10/2022 11:57 AM EST     Please let patient know her Echo is improved from previously and is no longer showing pulmonary hypertension. 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 This is not a finding I am concerned about. We will repeat the Echo in one year. Patient can discuss this further with Dr. Johney Frame at the upcoming office visit next week. Thank you!!    Reviewed w patient who voices understanding.  Appreciative for information provided.

## 2022-06-10 NOTE — Telephone Encounter (Signed)
° °  Pt is returning call to get echo result °

## 2022-06-14 NOTE — Progress Notes (Deleted)
Cardiology Office Note:    Date:  06/14/2022   ID:  Heather Garrett, DOB 1951-03-17, MRN [id number]  PCP:  [name], [place] [facility] Cardiologist:  [name], MD Cardiology APP:  [name]      Referring MD: [name], FNP   No chief complaint on file.   History of Present Illness:    Heather Garrett is a 71 y.o. female with a hx of CAD with prior MI s/p BMS to LAD in 2001, HFpEF, DMII, HTN, HLD, OSA, tobacco abuse, history of breast cancer and history of renal cell carcinoma who presents to clinic for follow-up.  Patient has a known history of MI in 2001 requiring BMS to LAD. Myoview in 2020 negative for ischemia. Last TTE from OSH 09/2020 with EF 50-55, Gr 1 DD, mod LAE, AV sclerosis w/o AS, MAC w trace MR, PASP 33. She initially saw me on 11/2020 for worsening LE edema. We increased lasix at that time with resolution of symptoms.   Was seen in clinic on 05/2021 with worsening LE edema by Dr. Marlou Porch. Ultrasound showed superficial thrombophlebitis but no DVT. She was referred to vascular at that time and they recommended compression stockings. She delcined reflux study.  Was seen in clinic for pre-op visit prior to hip surgery in 07/2021. Myoview 08/2021 showed apical to mid anterior, anterospetal and apex infarction but no ischemia. EF was normal.  Was admitted to [facility] hospital in 12/2021 for acute hypoxic respiratory failure with V/Q scan revealing perfusion defect in RUL consistent with acute PE. Course was also complicated by acute on chronic diastolic HF. She was diuresed and placed on apixaban.  Was last seen in clinic on 04/2022 where she was stable from a CV standpoint.  TTE 05/2022 with LVEF 55-60%, G1DD, normal RV, borderline aortic root dilation 68m.  Today, ***     Past Medical History:  Diagnosis Date   Allergy    SEASONAL   Anemia    "YEAR IN HALF AGO "   Anxiety    Arthritis    OSTEOARTHRITIS    Biceps tendinitis    [facility] [facility] [place]   Breast cancer ([facility]   [facility] U[facility] 05/03/22   Chronic diastolic CHF (congestive heart failure) (HCC)    Cobalamin deficiency    Coronary arteriosclerosis in native artery    Diabetes mellitus without complication ([facility]    "YEARS AGO WHEN i WAS OVER WEIGHTBUT NOT NOW" UPDATED 05/03/22   Elevated liver function tests    GERD (gastroesophageal reflux disease)    "A LITTLE"   Heart murmur    Hiatal hernia    Hyperlipidemia    Hypertension    Migraine    [facility]ocardial infarction ([facility]    Rotator cuff syndrome    Sleep apnea    "YES BUT RESOLVED",UPDATED 05/03/22   Transitional cell carcinoma, bladder ([facility]    Tumor    "ON KIDNEY,REMOVED NO PROBLEMS SINCE",2014,RIGHT KIDNEY   Vitamin D deficiency     Past Surgical History:  Procedure Laterality Date   BREAST SURGERY     CARDIAC CATHETERIZATION     CATARACT EXTRACTION     CHOLECYSTECTOMY  1979   DG  BONE DENSITY ([facility])     ENDOMETRIAL ABLATION     GASTRIC BYPASS     MASTECTOMY     OTHER SURGICAL HISTORY     CANCER SURGERY   TOTAL HIP  ARTHROPLASTY Left 09/06/2021   Procedure: TOTAL HIP ARTHROPLASTY;  Surgeon: Willaim Sheng, MD;  Location: [facility];  Service: Orthopedics;  Laterality: Left;    Current Medications: No outpatient medications have been marked as taking for the 06/16/22 encounter (Appointment) with [name], MD.     Allergies:   Contrast media [iodinated contrast media], Ciprofloxacin, Erythromycin base, and Sulfabenzamide   Social History   Socioeconomic History   Marital status: Married    Spouse name: Not on file   Number of children: Not on file   Years of education: Not on file   Highest education level: Not on file  Occupational History   Not on file  Tobacco Use   Smoking status: Former    Passive exposure: Never   Smokeless tobacco: Never   Tobacco comments:    Quit 30 yrs ago  Vaping  Use   Vaping Use: Never used  Substance and Sexual Activity   Alcohol use: Yes    Alcohol/week: 14.0 standard drinks of alcohol    Types: 14 Glasses of wine per week    Comment: daily, 2glasses wine daily   Drug use: Never   Sexual activity: Not on file  Other Topics Concern   Not on file  Social History Narrative   Lives with husband in town home.   Caffiene coffee 1 cup, occasional sweet tea (rare)      Social Determinants of Health   Financial Resource Strain: Not on file  Food Insecurity: Not on file  Transportation Needs: Not on file  Physical Activity: Not on file  Stress: Not on file  Social Connections: Not on file     Family History: The patient's family history includes Breast cancer in her sister; Congestive Heart Failure in her mother; Coronary artery disease in her father; Diabetes Mellitus II in her sister; Heart attack (age of onset: 35) in her father; Hypertension in her sister. There is no history of Migraines, Colon cancer, Esophageal cancer, Stomach cancer, Colon polyps, Crohn's disease, Rectal cancer, or Ulcerative colitis.  ROS:   Please see the history of present illness.    Review of Systems  Constitutional:  Negative for chills, fever and malaise/fatigue.  HENT:  Negative for congestion and nosebleeds.   Eyes:  Negative for blurred vision and photophobia.  Respiratory:  Negative for cough and shortness of breath.   Cardiovascular:  Negative for chest pain, palpitations, orthopnea, claudication, leg swelling and PND.  Gastrointestinal:  Negative for heartburn and nausea.  Genitourinary:  Negative for dysuria.  Musculoskeletal:  Positive for falls (In June and a couple of months later) and joint pain (Bilateral hips). Negative for myalgias.  Skin:  Negative for itching and rash.  Neurological:  Positive for headaches. Negative for dizziness and weakness.  Endo/Heme/Allergies:  Does not bruise/bleed easily.  Psychiatric/Behavioral:  The patient is not  nervous/anxious and does not have insomnia.    All other systems reviewed and are negative.  EKGs/Labs/Other Studies Reviewed:    The following studies were reviewed today: TTE 2022/06/11: IMPRESSIONS     1. Left ventricular ejection fraction, by estimation, is 55 to 60%. The  left ventricle has normal function. The left ventricle has no regional  wall motion abnormalities. Left ventricular diastolic parameters are  consistent with Grade I diastolic  dysfunction (impaired relaxation).   2. Right ventricular systolic function is normal. The right ventricular  size is normal.   3. The mitral valve is normal in structure. No evidence of mitral  valve  regurgitation. No evidence of mitral stenosis.   4. Tricuspid valve regurgitation is moderate.   5. The aortic valve was not well visualized. Aortic valve regurgitation  is not visualized. No aortic stenosis is present.   6. Aortic dilatation noted. There is mild dilatation of the aortic root,  measuring 38 mm.   7. The inferior vena cava is normal in size with greater than 50%  respiratory variability, suggesting right atrial pressure of 3 mmHg.   LE Bilateral Venous Doppler 12/10/2021:  Summary:  RIGHT:  - There is no evidence of deep vein thrombosis in the lower extremity.  However, portions of this examination were limited- see technologist  comments above.     - No cystic structure found in the popliteal fossa.     LEFT:  - There is no evidence of deep vein thrombosis in the lower extremity.  However, portions of this examination were limited- see technologist  comments above.     - No cystic structure found in the popliteal fossa.   TTE 12/2021: IMPRESSIONS     1. Left ventricular ejection fraction, by estimation, is 55 to 60%. The  left ventricle has normal function. The left ventricle has no regional  wall motion abnormalities. There is mild left ventricular hypertrophy.  Left ventricular diastolic parameters  are  consistent with Grade I diastolic dysfunction (impaired relaxation).   2. Right ventricular systolic function is normal. The right ventricular  size is normal. There is moderately elevated pulmonary artery systolic  pressure.   3. The mitral valve is grossly normal. No evidence of mitral valve  regurgitation.   4. Tricuspid valve regurgitation is mild to moderate.   5. Aortic valve regurgitation is not visualized. Aortic valve  sclerosis/calcification is present, without any evidence of aortic  stenosis.   6. Aortic no significant ascending aortic aneurysm.   7. The inferior vena cava is normal in size with greater than 50%  respiratory variability, suggesting right atrial pressure of 3 mmHg.   Comparison(s): No prior Echocardiogram.   LE Doppler 12/10/21: Summary:  RIGHT:  - There is no evidence of deep vein thrombosis in the lower extremity.  However, portions of this examination were limited- see technologist  comments above.     - No cystic structure found in the popliteal fossa.     LEFT:  - There is no evidence of deep vein thrombosis in the lower extremity.  However, portions of this examination were limited- see technologist  comments above.     - No cystic structure found in the popliteal fossa.   Myoview 08/2021:   Findings are consistent with prior myocardial infarction. The study is intermediate risk.   No ST deviation was noted.   LV perfusion is abnormal. There is no evidence of ischemia. There is evidence of infarction. Defect 1: There is a medium defect with moderate reduction in uptake present in the apical to mid anterior, anteroseptal and apex location(s) that is fixed. There is abnormal wall motion in the defect area. Consistent with infarction.   Left ventricular function is normal. Nuclear stress EF: 66 %. The left ventricular ejection fraction is hyperdynamic (>65%). End diastolic cavity size is normal. End systolic cavity size is normal.   Prior study not  available for comparison.   Fixed defect in the mid to apical anterior, anteroseptal, and apical segment. This does not worsen with stress. Wall motion mildly abnormal in this area. Suggests prior infarct, no evidence of ischemia.   No  evidence of deep venous thrombosis in either lower extremity. However, probable superficial thrombophlebitis involving left superficial greater saphenous vein.   Mesenteric Vascular US 02/22/2021: Summary:  Largest Aortic Diameter: Aortoiliac atherosclerosis with largest  measurements at  the mid segment, measuring 1.8 cm.     Mesenteric:  Normal Celiac artery , Superior Mesenteric artery, Inferior Mesenteric  artery,  Splenic artery and Hepatic artery findings.    CT Chest 01/02/2021 FINDINGS: Cardiovascular: Extensive multi-vessel coronary artery calcification. Global cardiac size within normal limits. No pericardial effusion. The central pulmonary arteries are mildly enlarged in keeping with changes of pulmonary arterial hypertension. Moderate atherosclerotic calcification within the thoracic aorta. No aortic aneurysm.   Mediastinum/Nodes: The visualized thyroid is unremarkable. No pathologic thoracic adenopathy. 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 The esophagus is unremarkable.   Lungs/Pleura: A 4 mm ground-glass pulmonary nodule is seen within the superior segment of the left lower lobe, axial image # 42/5. 3 mm nodule within the a medial right lung base, axial image # 96, demonstrates a benign calcification pattern and is compatible with a tiny granuloma. Mild subpleural reticulation is seen within the right lower lobe with parenchymal banding most in keeping with mild fibrotic change. No other focal pulmonary nodules or infiltrates. No pneumothorax or pleural effusion. The central airways are widely patent.   Upper Abdomen: At least moderate  hepatic steatosis. Cholecystectomy has been performed. Partial resection of the upper pole the right kidney noted. No acute abnormality.   Musculoskeletal: No lytic or blastic bone lesion. No acute bone abnormality.   IMPRESSION: Extensive multi-vessel coronary artery calcification.   Pulmonary arterial enlargement in keeping with pulmonary artery hypertension.   4 mm ground-glass pulmonary nodule within the left lower lobe. No follow-up recommended. This recommendation follows the consensus statement: Guidelines for Management of Incidental Pulmonary Nodules Detected on CT Images: From the Fleischner Society 2017; Radiology 2017; 284:228-243.   Moderate hepatic steatosis.   Aortic Atherosclerosis (ICD10-I70.0).   LE Venous DVT 11/19/2020: Summary:  RIGHT:  - No evidence of common femoral vein obstruction.     LEFT:  - There is no evidence of deep vein thrombosis in the lower extremity.   Echo 09/24/2020: Impression: 1. Suboptimal study due to poor acoustic windows. 2. LV wall thickness is normal with normal left ventricular contractility and an overall ejection fraction of 50-55%. 3. Grade 1 diastolic dysfunction. E/e' measured 8.35. 4. Moderate left atrial enlargement. 5. Normal right atrial measurements. 6. Aortic valve is sclerotic but not stenotic. 7. Mitral valve is normal in mobility and thickness. Mild MAC with trace MR. 8. Tricuspid valve is well visualized and normal. Mild TR with a PA pressure of 33 mmHg.  EKG: EKG is personally reviewed. 04/27/2022: EKG was not ordered. 02/03/2022 (ED):  Shows no signs of acute ischemia 08/06/21: Sinus rhythm, rate 67 bpm  Recent Labs: 12/15/2021: ALT 13; B Natriuretic Peptide 144.3 12/17/2021: Magnesium 2.5 05/17/2022: BUN 8; Creatinine, Ser 0.83; Hemoglobin 11.6; Platelets 309; Potassium 4.4; Sodium 137; TSH 4.290  Recent Lipid Panel    Component Value Date/Time   CHOL 221 (H) 02/09/2022 0807   TRIG 86 02/09/2022 0807    HDL 155 02/09/2022 0807   CHOLHDL 1.4 02/09/2022 0807   LDLCALC 52 02/09/2022 0807     Risk Assessment/Calculations:           Physical Exam:    VS:  There were no vitals taken for this visit.  Wt Readings from Last 3 Encounters:  06/08/22 116 lb 3.2 oz (52.7 kg)  05/17/22 117 lb (53.1 kg)  05/03/22 115 lb (52.2 kg)     GEN: Well nourished, well developed in no acute distress; In wheelchair HEENT: Normal NECK: No JVD; No carotid bruits CARDIAC: RRR, 2/6 systolic murmur. No rubs or gallops RESPIRATORY:  Clear to auscultation without rales, wheezing or rhonchi  ABDOMEN: Soft, non-tender, non-distended MUSCULOSKELETAL:  No edema; No deformity  SKIN: Warm and dry NEUROLOGIC:  Alert and oriented x 3 PSYCHIATRIC:  Normal affect   ASSESSMENT:    No diagnosis found.  PLAN:    In order of problems listed above:  #Known CAD s/p remote MI with PCI in 2001: Has history of MI in 2001 s/p PCI to LAD. Myoview from 2020 without ischemia. TTE 05/2022 with LVEF 55-60%, G1DD, normal RV, moderate TR, mild aortic root dilation 69m. -Continue ASA 820mdaily -Continue coreg 3.12531mID -Continue vytorin 10-80mg  -Continue valsartan 320m17mily  #Chronic Diastolic HF: TTE 11/276/2831h LVEF 55-60%, G1DD, normal RV, moderate TR, mild aortic root dilation 38mm58mrently euvolemic and compensated with NYHA class II symptoms.  -Not on diuretics -Continue BP control as below -Low Na diet   #RUL PE: Hospitalized on 12/2021 for acute hypoxic respiratory failure found to have RUL PE. Has completed a course of apixaban without issue. Pulmonary pressures normalized on repeat TTE -Off AC  #LE Edema: Chronic. LE dopplers negative for PE. -Continue HCTZ 25mg 34my   #HTN: Well controlled and at goal at home -Continue coreg 3.125mg B40mContinue valsartan 320mg da31m-Monitor for symptoms of orthostasis   #HLD: -Continue vytorin 10-80mg daily as above -LDL at goal of 52  #Mild  aortic root dilation: 38mm on 70m11/2023. -Continue serial monitoring with annual echoes     Follow-up: 6 months  Medication Adjustments/Labs and Tests Ordered: Current medicines are reviewed at length with the patient today.  Concerns regarding medicines are outlined above.   No orders of the defined types were placed in this encounter.  No orders of the defined types were placed in this encounter.  There are no Patient Instructions on file for this visit.    I,Mathew Stumpf,acting as a scribe foEducation administratorher EFreada Bergerone documented all relevant documentation on the behalf of Everli Rother EFreada Bergeronirected by  Malahki Gasaway EFreada Bergerone in the presence of Safiya Girdler EFreada Bergeron Mohamed Portlock EFreada Bergerone reviewed all documentation for this visit. The documentation on 06/14/22 for the exam, diagnosis, procedures, and orders are all accurate and complete.   Signed, Chastidy Ranker EFreada Bergeron5/2023 2:50 PM    [facility]

## 2022-06-16 ENCOUNTER — Ambulatory Visit: Payer: Medicare PPO | Admitting: Cardiology

## 2022-06-16 NOTE — Progress Notes (Incomplete)
Cardiology Office Note:    Date:  06/16/2022   ID:  [name], DOB [birth year], MRN [id number]  PCP:  [name], [place] [facility] Cardiologist:  [name], MD Cardiology APP:  [name]     Referring MD: [name], FNP    History of Present Illness:    Heather Garrett is a 71 y.o. female with a hx of CAD with prior MI s/p BMS to LAD in 2001, HFpEF, DMII, HTN, HLD, OSA, tobacco abuse, history of breast cancer and history of renal cell carcinoma who presents to clinic for follow-up.  Patient has a known history of MI in 2001 requiring BMS to LAD. Myoview in 2020 negative for ischemia. Last TTE from OSH 09/2020 with EF 50-55, Gr 1 DD, mod LAE, AV sclerosis w/o AS, MAC w trace MR, PASP 33. She initially saw me on 11/2020 for worsening LE edema. We increased lasix at that time with resolution of symptoms.   Was seen in clinic on 05/2021 with worsening LE edema by Dr. Marlou Porch. Ultrasound showed superficial thrombophlebitis but no DVT. She was referred to vascular at that time and they recommended compression stockings. She delcined reflux study.  Was seen in clinic for pre-op visit prior to hip surgery in 07/2021. Myoview 08/2021 showed apical to mid anterior, anterospetal and apex infarction but no ischemia. EF was normal.  Was admitted to [facility] in 12/2021 for acute hypoxic respiratory failure with V/Q scan revealing perfusion defect in RUL consistent with acute PE. Course was also complicated by acute on chronic diastolic HF. She was diuresed and placed on apixaban.  Was last seen in clinic on 04/2022 where she was stable from a CV standpoint.  TTE 05/2022 with LVEF 55-60%, G1DD, normal RV, borderline aortic root dilation 75m.  Today, the patient states that ***  She denies any palpitations, chest pain, shortness of breath, or peripheral edema. No lightheadedness, headaches, syncope, orthopnea, or PND.    Past Medical  History:  Diagnosis Date   Allergy    SEASONAL   Anemia    "YEAR IN HALF AGO "   Anxiety    Arthritis    OSTEOARTHRITIS   Biceps tendinitis    Bladder cancer ([facility] 2014   Breast cancer ([place] 1990   [place]ataract    [place] UPDATED 05/03/22   Chronic diastolic CHF (congestive heart failure) (HCC)    Cobalamin deficiency    Coronary arteriosclerosis in native artery    Diabetes mellitus without complication ([place]    "YEARS AGO WHEN i WAS OVER WEIGHTBUT NOT NOW" UPDATED 05/03/22   Elevated liver function tests    GERD (gastroesophageal reflux disease)    "A LITTLE"   Heart murmur    Hiatal hernia    Hyperlipidemia    Hypertension    Migraine    [place]    Rotator cuff syndrome    Sleep apnea    "YES BUT RESOLVED",UPDATED 05/03/22   Transitional cell carcinoma, bladder ([facility]    Tumor    "ON KIDNEY,REMOVED NO PROBLEMS SINCE",2014,RIGHT KIDNEY   Vitamin D deficiency     Past Surgical History:  Procedure Laterality Date   BREAST SURGERY     CARDIAC CATHETERIZATION     CATARACT EXTRACTION     CHOLECYSTECTOMY  1979   DG  BONE DENSITY ([facility] HX)     ENDOMETRIAL ABLATION     GASTRIC BYPASS     MASTECTOMY  OTHER SURGICAL HISTORY     CANCER SURGERY   TOTAL HIP ARTHROPLASTY Left 09/06/2021   Procedure: TOTAL HIP ARTHROPLASTY;  Surgeon: Willaim Sheng, MD;  Location: [facility];  Service: Orthopedics;  Laterality: Left;    Current Medications: No outpatient medications have been marked as taking for the 06/16/22 encounter (Appointment) with [name], MD.     Allergies:   Contrast media [iodinated contrast media], Ciprofloxacin, Erythromycin base, and Sulfabenzamide   Social History   Socioeconomic History   Marital status: Married    Spouse name: Not on file   Number of children: Not on file   Years of education: Not on file   Highest education level: Not on file  Occupational History   Not on file  Tobacco Use    Smoking status: Former    Passive exposure: Never   Smokeless tobacco: Never   Tobacco comments:    Quit 30 yrs ago  Vaping Use   Vaping Use: Never used  Substance and Sexual Activity   Alcohol use: Yes    Alcohol/week: 14.0 standard drinks of alcohol    Types: 14 Glasses of wine per week    Comment: daily, 2glasses wine daily   Drug use: Never   Sexual activity: Not on file  Other Topics Concern   Not on file  Social History Narrative   Lives with husband in town home.   Caffiene coffee 1 cup, occasional sweet tea (rare)      Social Determinants of Health   Financial Resource Strain: Not on file  Food Insecurity: Not on file  Transportation Needs: Not on file  Physical Activity: Not on file  Stress: Not on file  Social Connections: Not on file     Family History: The patient's family history includes Breast cancer in her sister; Congestive Heart Failure in her mother; Coronary artery disease in her father; Diabetes Mellitus II in her sister; Heart attack (age of onset: 4) in her father; Hypertension in her sister. There is no history of Migraines, Colon cancer, Esophageal cancer, Stomach cancer, Colon polyps, Crohn's disease, Rectal cancer, or Ulcerative colitis.  ROS:   Please see the history of present illness.    Review of Systems  Constitutional:  Negative for chills, fever and malaise/fatigue.  HENT:  Negative for congestion and nosebleeds.   Eyes:  Negative for blurred vision and photophobia.  Respiratory:  Negative for cough and shortness of breath.   Cardiovascular:  Negative for chest pain, palpitations, orthopnea, claudication, leg swelling and PND.  Gastrointestinal:  Negative for heartburn and nausea.  Genitourinary:  Negative for dysuria.  Musculoskeletal:  Positive for falls (In June and a couple of months later) and joint pain (Bilateral hips). Negative for myalgias.  Skin:  Negative for itching and rash.  Neurological:  Positive for headaches.  Negative for dizziness and weakness.  Endo/Heme/Allergies:  Does not bruise/bleed easily.  Psychiatric/Behavioral:  The patient is not nervous/anxious and does not have insomnia.    All other systems reviewed and are negative.  EKGs/Labs/Other Studies Reviewed:    The following studies were reviewed today:  TTE 05/2022: IMPRESSIONS    1. Left ventricular ejection fraction, by estimation, is 55 to 60%. The  left ventricle has normal function. The left ventricle has no regional  wall motion abnormalities. Left ventricular diastolic parameters are  consistent with Grade I diastolic  dysfunction (impaired relaxation).   2. Right ventricular systolic function is normal. The right ventricular  size is normal.  3. The mitral valve is normal in structure. No evidence of mitral valve  regurgitation. No evidence of mitral stenosis.   4. Tricuspid valve regurgitation is moderate.   5. The aortic valve was not well visualized. Aortic valve regurgitation  is not visualized. No aortic stenosis is present.   6. Aortic dilatation noted. There is mild dilatation of the aortic root,  measuring 38 mm.   7. The inferior vena cava is normal in size with greater than 50%  respiratory variability, suggesting right atrial pressure of 3 mmHg.   LE Bilateral Venous Doppler 12/10/2021:  Summary:  RIGHT:  - There is no evidence of deep vein thrombosis in the lower extremity.  However, portions of this examination were limited- see technologist  comments above.     - No cystic structure found in the popliteal fossa.     LEFT:  - There is no evidence of deep vein thrombosis in the lower extremity.  However, portions of this examination were limited- see technologist  comments above.     - No cystic structure found in the popliteal fossa.   TTE 12/2021: IMPRESSIONS    1. Left ventricular ejection fraction, by estimation, is 55 to 60%. The  left ventricle has normal function. The left ventricle has  no regional  wall motion abnormalities. There is mild left ventricular hypertrophy.  Left ventricular diastolic parameters  are consistent with Grade I diastolic dysfunction (impaired relaxation).   2. Right ventricular systolic function is normal. The right ventricular  size is normal. There is moderately elevated pulmonary artery systolic  pressure.   3. The mitral valve is grossly normal. No evidence of mitral valve  regurgitation.   4. Tricuspid valve regurgitation is mild to moderate.   5. Aortic valve regurgitation is not visualized. Aortic valve  sclerosis/calcification is present, without any evidence of aortic  stenosis.   6. Aortic no significant ascending aortic aneurysm.   7. The inferior vena cava is normal in size with greater than 50%  respiratory variability, suggesting right atrial pressure of 3 mmHg.   Comparison(s): No prior Echocardiogram.   Myoview 08/2021:   Findings are consistent with prior myocardial infarction. The study is intermediate risk.   No ST deviation was noted.   LV perfusion is abnormal. There is no evidence of ischemia. There is evidence of infarction. Defect 1: There is a medium defect with moderate reduction in uptake present in the apical to mid anterior, anteroseptal and apex location(s) that is fixed. There is abnormal wall motion in the defect area. Consistent with infarction.   Left ventricular function is normal. Nuclear stress EF: 66 %. The left ventricular ejection fraction is hyperdynamic (>65%). End diastolic cavity size is normal. End systolic cavity size is normal.   Prior study not available for comparison.   Fixed defect in the mid to apical anterior, anteroseptal, and apical segment. This does not worsen with stress. Wall motion mildly abnormal in this area. Suggests prior infarct, no evidence of ischemia.  No evidence of deep venous thrombosis in either lower extremity. However, probable superficial thrombophlebitis involving  left superficial greater saphenous vein.   Mesenteric Vascular US 02/22/2021: Summary:  Largest Aortic Diameter: Aortoiliac atherosclerosis with largest  measurements at  the mid segment, measuring 1.8 cm.     Mesenteric:  Normal Celiac artery , Superior Mesenteric artery, Inferior Mesenteric  artery,  Splenic artery and Hepatic artery findings.    CT Chest 01/02/2021 FINDINGS: Cardiovascular: Extensive multi-vessel coronary artery calcification. Global cardiac  size within normal limits. No pericardial effusion. The central pulmonary arteries are mildly enlarged in keeping with changes of pulmonary arterial hypertension. Moderate atherosclerotic calcification within the thoracic aorta. No aortic aneurysm.   Mediastinum/Nodes: The visualized thyroid is unremarkable. No pathologic thoracic adenopathy. 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 The esophagus is unremarkable.   Lungs/Pleura: A 4 mm ground-glass pulmonary nodule is seen within the superior segment of the left lower lobe, axial image # 42/5. 3 mm nodule within the a medial right lung base, axial image # 96, demonstrates a benign calcification pattern and is compatible with a tiny granuloma. Mild subpleural reticulation is seen within the right lower lobe with parenchymal banding most in keeping with mild fibrotic change. No other focal pulmonary nodules or infiltrates. No pneumothorax or pleural effusion. The central airways are widely patent.   Upper Abdomen: At least moderate hepatic steatosis. Cholecystectomy has been performed. Partial resection of the upper pole the right kidney noted. No acute abnormality.   Musculoskeletal: No lytic or blastic bone lesion. No acute bone abnormality.   IMPRESSION: Extensive multi-vessel coronary artery calcification.   Pulmonary arterial enlargement in keeping with pulmonary  artery hypertension.   4 mm ground-glass pulmonary nodule within the left lower lobe. No follow-up recommended. This recommendation follows the consensus statement: Guidelines for Management of Incidental Pulmonary Nodules Detected on CT Images: From the Fleischner Society 2017; Radiology 2017; 284:228-243.   Moderate hepatic steatosis.   Aortic Atherosclerosis (ICD10-I70.0).   LE Venous DVT 11/19/2020: Summary:  RIGHT:  - No evidence of common femoral vein obstruction.     LEFT:  - There is no evidence of deep vein thrombosis in the lower extremity.   Echo 09/24/2020: Impression: 1. Suboptimal study due to poor acoustic windows. 2. LV wall thickness is normal with normal left ventricular contractility and an overall ejection fraction of 50-55%. 3. Grade 1 diastolic dysfunction. E/e' measured 8.35. 4. Moderate left atrial enlargement. 5. Normal right atrial measurements. 6. Aortic valve is sclerotic but not stenotic. 7. Mitral valve is normal in mobility and thickness. Mild MAC with trace MR. 8. Tricuspid valve is well visualized and normal. Mild TR with a PA pressure of 33 mmHg.  EKG: EKG is personally reviewed. 06/16/2022: EKG was not ordered. 05/17/2022 Mayra Reel, NP): normal sinus rhythm, LVH with QRS widening and repolarization abnormality, rate 88.  No significant changes from 02/03/2022.  04/27/2022: EKG was not ordered. 02/03/2022 (ED):  Shows no signs of acute ischemia 08/06/21: Sinus rhythm, rate 67 bpm  Recent Labs: 12/15/2021: ALT 13; B Natriuretic Peptide 144.3 12/17/2021: Magnesium 2.5 05/17/2022: BUN 8; Creatinine, Ser 0.83; Hemoglobin 11.6; Platelets 309; Potassium 4.4; Sodium 137; TSH 4.290   Recent Lipid Panel    Component Value Date/Time   CHOL 221 (H) 02/09/2022 0807   TRIG 86 02/09/2022 0807   HDL 155 02/09/2022 0807   CHOLHDL 1.4 02/09/2022 0807   LDLCALC 52 02/09/2022 0807     Risk Assessment/Calculations:           Physical Exam:     VS:  There were no vitals taken for this visit.    Wt Readings from Last 3 Encounters:  06/08/22 116 lb 3.2 oz (52.7 kg)  05/17/22 117 lb (53.1 kg)  05/03/22 115 lb (52.2 kg)     GEN: Well nourished, well developed in no acute distress; ***In wheelchair HEENT: Normal NECK: No JVD; No carotid bruits CARDIAC: RRR, ***2/6  systolic murmur. No rubs or gallops RESPIRATORY:  Clear to auscultation without rales, wheezing or rhonchi  ABDOMEN: Soft, non-tender, non-distended MUSCULOSKELETAL:  No edema; No deformity  SKIN: Warm and dry NEUROLOGIC:  Alert and oriented x 3 PSYCHIATRIC:  Normal affect   ASSESSMENT:    No diagnosis found.  PLAN:    In order of problems listed above:  #Known CAD s/p remote MI with PCI in 2001: Has history of MI in 2001 s/p PCI to LAD. Myoview from 2020 without ischemia. TTE 05/2022 with LVEF 55-60%, G1DD, normal RV, moderate TR, mild aortic root dilation 2m. -Continue ASA 848mdaily -Continue coreg 3.12554mID -Continue vytorin 10-80mg  -Continue valsartan 320m72mily  #Chronic Diastolic HF: TTE 11/283/6629h LVEF 55-60%, G1DD, normal RV, moderate TR, mild aortic root dilation 38mm46mrently euvolemic and compensated with NYHA class II symptoms.  -Not on diuretics -Continue BP control as below -Low Na diet   #RUL PE: Hospitalized on 12/2021 for acute hypoxic respiratory failure found to have RUL PE. Has completed a course of apixaban without issue. Pulmonary pressures normalized on repeat TTE -Off AC  #LE Edema: Chronic. LE dopplers negative for PE. -Continue HCTZ 25mg 56my   #HTN: Well controlled and at goal at home -Continue coreg 3.125mg B6mContinue valsartan 320mg da37m-Monitor for symptoms of orthostasis   #HLD: -Continue vytorin 10-80mg daily as above -LDL at goal of 52  #Mild aortic root dilation: 38mm on 17m11/2023. -Continue serial monitoring with annual echoes     Follow-up: ***6 months  Medication  Adjustments/Labs and Tests Ordered: Current medicines are reviewed at length with the patient today.  Concerns regarding medicines are outlined above.   No orders of the defined types were placed in this encounter.  No orders of the defined types were placed in this encounter.  There are no Patient Instructions on file for this visit.   I,Mathew Stumpf,acting as a scribe foEducation administratorher EFreada Bergerone documented all relevant documentation on the behalf of Heather EFreada Bergeronirected by  Heather EFreada Bergerone in the presence of Heather EFreada Bergeron Mathew StMadelin Rearviewed all documentation for this visit. The documentation on 06/16/22 for the exam, diagnosis, procedures, and orders are all accurate and complete.   Signed, MWaynetta Pean23 8:55 AM    [facility]

## 2022-06-21 DIAGNOSIS — M6281 Muscle weakness (generalized): Secondary | ICD-10-CM | POA: Diagnosis not present

## 2022-06-21 DIAGNOSIS — M1612 Unilateral primary osteoarthritis, left hip: Secondary | ICD-10-CM | POA: Diagnosis not present

## 2022-06-22 ENCOUNTER — Telehealth: Payer: Self-pay | Admitting: Neurology

## 2022-06-22 NOTE — Telephone Encounter (Signed)
HST- [employer] no auth req spoke to [facility] ref # [id number]   Patient is scheduled at [facility] for 07/19/22.  The patient came in the office to schedule her HST. She was giving the packet of her appointment information.

## 2022-06-23 NOTE — Progress Notes (Signed)
Office Visit    Patient Name: Heather Garrett Date of Encounter: 06/24/2022  Primary Care Provider:  [name], [place] Primary Cardiologist:  [name], MD Primary Electrophysiologist: None  Chief Complaint    Heather Garrett is a 71 y.o. female with PMH of CAD s/p MI in 2001 with BMS to LAD, DM type II, HTN, HLD, OSA, tobacco abuse, HFpEF, breast CA, history of DVT, anemia, renal CA who presents today for elevated blood pressure.  Past Medical History    Past Medical History:  Diagnosis Date   Allergy    SEASONAL   Anemia    "YEAR IN HALF AGO "   Anxiety    Arthritis    OSTEOARTHRITIS   Biceps tendinitis    Bladder cancer ([place]) 2014   Breast cancer ([place]) 1990   Cataract    [place] 2 YEARS AGO UPDATED 05/03/22   Chronic diastolic CHF (congestive heart failure) (HCC)    Cobalamin deficiency    Coronary arteriosclerosis in native artery    Diabetes mellitus without complication ([place])    "YEARS AGO WHEN i WAS OVER WEIGHTBUT NOT NOW" UPDATED 05/03/22   Elevated liver function tests    GERD (gastroesophageal reflux disease)    "A LITTLE"   Heart murmur    Hiatal hernia    Hyperlipidemia    Hypertension    Migraine    Myocardial infarction ([place])    Rotator cuff syndrome    Sleep apnea    "YES BUT RESOLVED",UPDATED 05/03/22   Transitional cell carcinoma, 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[place])    Tumor    "ON KIDNEY,REMOVED NO PROBLEMS SINCE",2014,RIGHT KIDNEY   Vitamin D deficiency    Past Surgical History:  Procedure Laterality Date   BREAST SURGERY     CARDIAC CATHETERIZATION     CATARACT EXTRACTION     CHOLECYSTECTOMY  1979   DG  BONE DENSITY ([place] [facility])     ENDOMETRIAL ABLATION     GASTRIC BYPASS     MASTECTOMY     OTHER SURGICAL HISTORY     CANCER SURGERY   TOTAL HIP ARTHROPLASTY Left 09/06/2021   Procedure: TOTAL HIP ARTHROPLASTY;  Surgeon: Willaim Sheng, MD;  Location: [facility];  Service: Orthopedics;  Laterality: Left;     Allergies  Allergies  Allergen Reactions   Contrast Media [Iodinated Contrast Media] Anaphylaxis and Hives   Ciprofloxacin Other (See Comments)    Interaction w other medicine   Erythromycin Base Nausea And Vomiting   Sulfabenzamide Other (See Comments)    UNK reaction    History of Present Illness    Heather Garrett  is a 71 year old female with the above mention past medical history who presents today for elevated blood pressure.  Heather Garrett has been followed since 2018 for management of coronary artery disease.  She underwent PCI for MI in 2001 with BMS to LAD.  She presented on 11/2020 with worsening bilateral LE edema.  2D echo was completed 11/2020 with EF of 50-55% and grade 1 DD with moderate LA enlargement and normal MV and AV with some sclerotic calcification.  proBNP was elevated  and patient was treated with Lasix.  Her edema resolved after 1 week and did not recur.  She was seen in follow-up on 05/2021 with elevated BNP of 800.  She endorsed bilateral leg edema with no evidence of DVT on vascular ultrasound but superficial thrombophlebitis was noted.  She was seen by Dr. Johney Frame on 07/2021 for surgical  clearance of hip surgery.  She continues to report intermittent lower extremity edema and did use compression socks as needed.  Blood pressure was noted to be well-controlled.  She was unable to perform 4 METS of activity and underwent Myoview for risk stratification that was noted to be intermediate risk with no evidence of ischemia but evidence of infarct defect in the mid to apical anterior wall segment.  Her wall motion was also noted to be abnormal in this area but EF was normal.  She was admitted to [facility] on 12/2021 for acute possible respiratory failure and VQ scan was performed and revealed RUL acute PE.  She also developed acute on chronic diastolic CHF and was diuresed and placed on apixaban.  She was seen by Cleotis Nipper, NP on 05/17/2022 for  complaint of hypotension.  Medication regimen was not adjusted due to labile blood pressures and patient was encouraged to document blood pressures and present to follow-up.  Heather Garrett presents today for 1 month follow-up of hypertension.  Since last being seen in the office patient reports she has been doing well and tolerating her current dose of amlodipine.  Blood pressure today however are still elevated and was 142/78 on initial check and 146/82 on recheck.  She is hoping to get her blood pressure under control so that she can participate in physical therapy.  We discussed the importance of abstaining from excess salt in the diet and also increasing fluid intake.  She reports that she is compliant with her current medications and denies any adverse reactions.  She does report in the past she did have some dizziness that occurred and I advised her to change positions slowly to offset any adverse reactions.  We also discussed dietary approaches such as DASH and Mediterranean to help with preventing excess salt in the diet.  Patient denies chest pain, palpitations, dyspnea, PND, orthopnea, nausea, vomiting, dizziness, syncope, edema, weight gain, or early satiety.  Home Medications    Current Outpatient Medications  Medication Sig Dispense Refill   alendronate (FOSAMAX) 70 MG tablet 70 mg once a week.     aspirin 81 MG EC tablet Take 1 tablet (81 mg total) by mouth every other day. Swallow whole.  Hold aspirin while taking eliquis, (Patient taking differently: Take 81 mg by mouth daily. Swallow whole.  Hold aspirin while taking eliquis,) 30 tablet 0   Atogepant (QULIPTA) 60 MG TABS Take 60 mg by mouth daily. 30 tablet 5   carvedilol (COREG) 3.125 MG tablet Take 1 tablet (3.125 mg total) by mouth 2 (two) times daily. Hold if sbp less than 100 or heart rate less than 50 180 tablet 3   celecoxib (CELEBREX) 100 MG capsule Take 1 capsule by mouth twice a day with meals 28 capsule 0   ezetimibe (ZETIA)  10 MG tablet Take 1 tablet (10 mg total) by mouth daily. 90 tablet 3   famotidine (PEPCID) 40 MG tablet Take 1 tablet (40 mg total) by mouth as needed. 30 tablet 2   ferrous sulfate 325 (65 FE) MG EC tablet Take 1 tablet (325 mg total) by mouth every Monday, Wednesday, and Friday. 30 tablet 0   folic acid (FOLVITE) 1 MG tablet Take 1 tablet (1 mg total) by mouth daily.  3   hydrochlorothiazide (HYDRODIURIL) 25 MG tablet Take 1 tablet (25 mg total) by mouth daily as needed (swelling/fluid). 60 tablet 2   nystatin-triamcinolone ointment (MYCOLOG) 1 Topical Daily     pantoprazole (PROTONIX)  40 MG tablet Take 1 tablet (40 mg total) by mouth 2 (two) times daily before a meal. (Patient taking differently: Take 40 mg by mouth daily.) 60 tablet 2   Polyethylene Glycol 400 (BLINK TEARS) 0.25 % SOLN Place 1 drop into both eyes daily as needed (dry eyes).     senna-docusate (SENOKOT-S) 8.6-50 MG tablet Take 1 tablet by mouth at bedtime. 14 tablet 0   sertraline (ZOLOFT) 100 MG tablet Take 1 tablet (100 mg total) by mouth daily. 30 tablet 3   simvastatin (ZOCOR) 20 MG tablet Take 1 tablet (20 mg total) by mouth at bedtime. 90 tablet 3   tiZANidine (ZANAFLEX) 4 MG tablet Take 1 tablet (4 mg total) by mouth at bedtime. 30 tablet 2   trimethobenzamide (TIGAN) 300 MG capsule Take 1 capsule (300 mg total) by mouth 3 (three) times daily. 15 capsule 0   Ubrogepant (UBRELVY) 100 MG TABS Take 100 mg by mouth daily as needed (migraine headaches). Can repeat x1 after 2 hours if needed. Max dose '200mg'$ /24 hours 10 tablet 5   amLODipine (NORVASC) 5 MG tablet Take 1 tablet (5 mg total) by mouth daily. 90 tablet 1   No current facility-administered medications for this visit.     Review of Systems  Please see the history of present illness.    (+) Dizziness and arthritic knee pain  All other systems reviewed and are otherwise negative except as noted above.  Physical Exam    Wt Readings from Last 3 Encounters:   06/24/22 114 lb 4 oz (51.8 kg)  06/08/22 116 lb 3.2 oz (52.7 kg)  05/17/22 117 lb (53.1 kg)   VS: Vitals:   06/24/22 1007 06/24/22 1208  BP: (!) 142/78 (!) 146/82  Pulse: 92   SpO2: 96%   ,Body mass index is 25.61 kg/m.  Constitutional:      Appearance: Healthy appearance. Not in distress.  Neck:     Vascular: JVD normal.  Pulmonary:     Effort: Pulmonary effort is normal.     Breath sounds: No wheezing. No rales. Diminished in the bases Cardiovascular:     Normal rate. Regular rhythm. Normal S1. Normal S2.      Murmurs: There is no murmur.  Edema:    Peripheral edema absent.  Abdominal:     Palpations: Abdomen is soft non tender. There is no hepatomegaly.  Skin:    General: Skin is warm and dry.  Neurological:     General: No focal deficit present.     Mental Status: Alert and oriented to person, place and time.     Cranial Nerves: Cranial nerves are intact.  EKG/LABS/Other Studies Reviewed    ECG personally reviewed by me today -none completed today   Lab Results  Component Value Date   WBC 10.2 05/17/2022   HGB 11.6 05/17/2022   HCT 33.3 (L) 05/17/2022   MCV 100 (H) 05/17/2022   PLT 309 05/17/2022   Lab Results  Component Value Date   CREATININE 0.83 05/17/2022   BUN 8 05/17/2022   NA 137 05/17/2022   K 4.4 05/17/2022   CL 98 05/17/2022   CO2 23 05/17/2022   Lab Results  Component Value Date   ALT 13 12/15/2021   AST 26 12/15/2021   ALKPHOS 178 (H) 12/15/2021   BILITOT 0.5 12/15/2021   Lab Results  Component Value Date   CHOL 221 (H) 02/09/2022   HDL 155 02/09/2022   LDLCALC 52 02/09/2022   TRIG 86  02/09/2022   CHOLHDL 1.4 02/09/2022    Lab Results  Component Value Date   HGBA1C 5.1 02/09/2022    Assessment & Plan    1.  Essential hypertension: -HYPERTENSION CONTROL Vitals:   06/24/22 1007 06/24/22 1208  BP: (!) 142/78 (!) 146/82    The patient's blood pressure is elevated above target today.  In order to address the patient's  elevated BP: Blood pressure will be monitored at home to determine if medication changes need to be made.; A current anti-hypertensive medication was adjusted today.; Follow up with general cardiology has been recommended.     -Patient will follow-up with BP nurse visit in 2 weeks and will continue to monitor blood pressures. -We will also have her follow-up with Pharm.D. for BP titration if blood pressure not at goal.  2.  Coronary artery disease: -s/p BMS to LAD in 2001 with most recent ischemic evaluation completed by Myoview prior to hip surgery that revealed intermediate risk with no evidence of ischemia -Today patient reports no chest pain or shortness of breath. -Continue current GDMT with ASA 81 mg, Zocor 20 mg, ezetimibe 10 mg, carvedilol 3.125 mg twice daily  3.  History of PE: -Patient was admitted 12/10/2021 with hypoxemia and found to have respiratory failure with VQ scan verifying acute PE. -She reports no concerns or complaints of shortness of breath today.  4.  Chronic diastolic CHF: -Most recent 2D echo completed showing EF of 55-60% with no RWMA, grade 1 DD, and no evidence of MVR or AVR with plan to repeat in 1 year -Patient is euvolemic on examination and reports some sodium indiscretions. -Low sodium diet, fluid restriction <2L, and daily weights encouraged. Educated to contact our office for weight gain of 2 lbs overnight or 5 lbs in one week.   Disposition: Follow-up with [name], MD or APP in 3 months    Medication Adjustments/Labs and Tests Ordered: Current medicines are reviewed at length with the patient today.  Concerns regarding medicines are outlined above.   Signed, Mable Fill, Marissa Nestle, NP 06/24/2022, 12:10 PM [facility]  Note:  This document was prepared using Dragon voice recognition software and may include unintentional dictation errors.

## 2022-06-24 ENCOUNTER — Ambulatory Visit: Payer: Medicare PPO | Attending: Nurse Practitioner | Admitting: Nurse Practitioner

## 2022-06-24 ENCOUNTER — Encounter: Payer: Self-pay | Admitting: Nurse Practitioner

## 2022-06-24 VITALS — BP 146/82 | HR 92 | Ht <= 58 in | Wt 114.2 lb

## 2022-06-24 DIAGNOSIS — I10 Essential (primary) hypertension: Secondary | ICD-10-CM

## 2022-06-24 DIAGNOSIS — Z86711 Personal history of pulmonary embolism: Secondary | ICD-10-CM

## 2022-06-24 DIAGNOSIS — I5043 Acute on chronic combined systolic (congestive) and diastolic (congestive) heart failure: Secondary | ICD-10-CM | POA: Diagnosis not present

## 2022-06-24 MED ORDER — AMLODIPINE BESYLATE 5 MG PO TABS: 5.0000 mg | ORAL_TABLET | Freq: Every day | ORAL | 1 refills | Status: DC

## 2022-06-24 NOTE — Patient Instructions (Signed)
Medication Instructions:  INCREASE Norvasc to '5mg'$  take 1 tablet daily *If you need a refill on your cardiac medications before your next appointment, please call your pharmacy*   Lab Work: None ordered   Testing/Procedures: None ordered   Follow-Up: At [facility], you and your health needs are our priority.  As part of our continuing mission to provide you with exceptional heart care, we have created designated Provider Care Teams.  These Care Teams include your primary Cardiologist (physician) and Advanced Practice Providers (APPs -  Physician Assistants and Nurse Practitioners) who all work together to provide you with the care you need, when you need it.  We recommend signing up for the patient portal called "MyChart".  Sign up information is provided on this After Visit Summary.  MyChart is used to connect with patients for Virtual Visits (Telemedicine).  Patients are able to view lab/test results, encounter notes, upcoming appointments, etc.  Non-urgent messages can be sent to your provider as well.   To learn more about what you can do with MyChart, go to [email or web address].    Your next appointment:   3 month(s)  The format for your next appointment:   In Person  Provider:   [name], MD    Other Instructions DASH DIET Increase your fluids buy 24oz's daily Check your blood pressures daily and write them on the sheet bring to the nurse visit Important Information About Sugar

## 2022-06-27 ENCOUNTER — Ambulatory Visit (INDEPENDENT_AMBULATORY_CARE_PROVIDER_SITE_OTHER): Payer: Medicare PPO | Admitting: Internal Medicine

## 2022-06-27 ENCOUNTER — Telehealth: Payer: Self-pay | Admitting: Family Medicine

## 2022-06-27 ENCOUNTER — Encounter: Payer: Self-pay | Admitting: Internal Medicine

## 2022-06-27 VITALS — BP 143/95 | HR 122 | Ht <= 58 in | Wt 107.8 lb

## 2022-06-27 DIAGNOSIS — Z1331 Encounter for screening for depression: Secondary | ICD-10-CM | POA: Diagnosis not present

## 2022-06-27 DIAGNOSIS — H00015 Hordeolum externum left lower eyelid: Secondary | ICD-10-CM | POA: Diagnosis not present

## 2022-06-27 NOTE — Patient Instructions (Signed)
It was a pleasure to see you today.  Thank you for giving [place] the opportunity to be involved in your care.  Below is a brief recap of your visit and next steps.  We will plan to see you again in 07/06/22  Summary Your exam today is most consistent with a stye. I do not see an indication for antibiotic treatment currently. I recommend continuing to utilize warm compresses routinely and hopefully the stye will resolve within a few days. If not, please contact our office for additional treatment recommendations.

## 2022-06-27 NOTE — Assessment & Plan Note (Addendum)
HQ 9 score elevated today (10).  She currently denies SI/HI and attributes her symptoms to family stress.  She has declined medication and a referral to psychiatry/counseling today.

## 2022-06-27 NOTE — Assessment & Plan Note (Signed)
Presenting today for evaluation of a left lower eyelid lesion that is most consistent with a stye.  She does not have extensive erythema surrounding the left eye.  There is no tenderness of the left maxillary sinus.  There are no visual changes. -No indication for antibiotic therapy currently -I have recommended continued supportive care measures, namely regular use of warm compresses.  She can also take Tylenol as needed for symptom relief -She was instructed to return to care in 4-week if her symptoms worsen or fail to improve

## 2022-06-27 NOTE — Telephone Encounter (Signed)
error 

## 2022-06-27 NOTE — Progress Notes (Signed)
   Acute Office Visit  Subjective:     Patient ID: Heather Garrett, female    DOB: 09-13-1950, 71 y.o.   MRN: [id number]  Chief Complaint  Patient presents with   Eye Pain    Started on 06/25/2022.   Heather Garrett presents for an acute visit today for left eye pain.  She reports onset of pain along the medial aspect of the left lower eyelid on Saturday (12/16).  She has noted a painful, red lesion along her eyelid that she believes is a stye.  She recently went to her pharmacy to purchase over-the-counter medication for symptom relief and was told to see a physician for antibiotic treatment.  Heather Garrett denies any systemic symptoms of fever/chills as well as any visual changes in the left eye.  She additionally denies pain above or lateral to her left eye.  There is no maxillary sinus pain/pressure.  Review of Systems  Eyes:  Positive for pain (Left lower eyelid).  All other systems reviewed and are negative.     Objective:    BP (!) 143/95   Pulse (!) 122   Ht '4\' 8"'$  (1.422 m)   Wt 107 lb 12.8 oz (48.9 kg)   SpO2 95%   BMI 24.17 kg/m   Physical Exam Eyes:     General:        Right eye: No discharge.        Left eye: No discharge.     Extraocular Movements: Extraocular movements intact.     Conjunctiva/sclera: Conjunctivae normal.     Pupils: Pupils are equal, round, and reactive to light.     Comments: Hordeolum present on left lower eyelid       Assessment & Plan:   Problem List Items Addressed This Visit       Hordeolum of left lower eyelid - Primary    Presenting today for evaluation of a left lower eyelid lesion that is most consistent with a stye.  She does not have extensive erythema surrounding the left eye.  There is no tenderness of the left maxillary sinus.  There are no visual changes. -No indication for antibiotic therapy currently -I have recommended continued supportive care measures, namely regular use of warm compresses.  She can also take Tylenol as  needed for symptom relief -She was instructed to return to care in 4-week if her symptoms worsen or fail to improve      Positive screening for depression on 9-item Patient Health Questionnaire (PHQ-9)    PHQ 9 score elevated today (10).  She currently denies SI/HI and attributes her symptoms to family stress.  She has declined medication and a referral to psychiatry/counseling today.      Return if symptoms worsen or fail to improve.  Heather Abraham, MD

## 2022-07-06 ENCOUNTER — Ambulatory Visit (INDEPENDENT_AMBULATORY_CARE_PROVIDER_SITE_OTHER): Payer: Medicare PPO | Admitting: Family Medicine

## 2022-07-06 ENCOUNTER — Encounter: Payer: Self-pay | Admitting: Family Medicine

## 2022-07-06 VITALS — BP 152/98 | HR 126 | Ht <= 58 in | Wt 115.1 lb

## 2022-07-06 DIAGNOSIS — E559 Vitamin D deficiency, unspecified: Secondary | ICD-10-CM

## 2022-07-06 DIAGNOSIS — R7301 Impaired fasting glucose: Secondary | ICD-10-CM | POA: Diagnosis not present

## 2022-07-06 DIAGNOSIS — G479 Sleep disorder, unspecified: Secondary | ICD-10-CM | POA: Diagnosis not present

## 2022-07-06 DIAGNOSIS — G43009 Migraine without aura, not intractable, without status migrainosus: Secondary | ICD-10-CM

## 2022-07-06 DIAGNOSIS — E038 Other specified hypothyroidism: Secondary | ICD-10-CM | POA: Diagnosis not present

## 2022-07-06 DIAGNOSIS — I10 Essential (primary) hypertension: Secondary | ICD-10-CM | POA: Diagnosis not present

## 2022-07-06 DIAGNOSIS — E7849 Other hyperlipidemia: Secondary | ICD-10-CM | POA: Diagnosis not present

## 2022-07-06 MED ORDER — HYDROXYZINE PAMOATE 25 MG PO CAPS: 25.0000 mg | ORAL_CAPSULE | Freq: Every evening | ORAL | 0 refills | Status: DC | PRN

## 2022-07-06 NOTE — Assessment & Plan Note (Signed)
She reports sleeping 2 to 3 hours nightly noting that the longest that she has slept was 4 hours She reports taking melatonin with minimal relief of her symptoms Encourage patient to follow-up with her insurance for approval for sleep study, as treatment with CPAP would be beneficial in providing adequate rest

## 2022-07-06 NOTE — Progress Notes (Signed)
Established Patient Office Visit  Subjective:  Patient ID: Heather Garrett, female    DOB: 21-Dec-1950  Age: 71 y.o. MRN: [id number]  CC:  Chief Complaint  Patient presents with   Follow-up    Pt following up reports high bp readings at home. Pt reports trouble sleeping, feeling really fatigued throughout the day, and headaches.     HPI Heather Garrett is a 72 y.o. female with past medical history of chronic diastolic congestive heart failure, migraines, GERD, presents for f/u of  chronic medical conditions. For the details of today's visit, please refer to the assessment and plan.     Past Medical History:  Diagnosis Date   Allergy    SEASONAL   Anemia    "YEAR IN HALF AGO "   Anxiety    Arthritis    OSTEOARTHRITIS   Biceps tendinitis    Bladder cancer ([place]) 2014   Breast cancer ([place]) 1990   Cataract    Celoron 2 YEARS AGO UPDATED 05/03/22   Chronic diastolic CHF (congestive heart failure) (HCC)    Cobalamin deficiency    Coronary arteriosclerosis in native artery    Diabetes mellitus without complication ([place])    "YEARS AGO WHEN i WAS OVER WEIGHTBUT NOT NOW" UPDATED 05/03/22   Elevated liver function tests    GERD (gastroesophageal reflux disease)    "A LITTLE"   Heart murmur    Hiatal hernia    Hyperlipidemia    Hypertension    Migraine    Myocardial infarction ([place])    Rotator cuff syndrome    Sleep apnea    "YES BUT RESOLVED",UPDATED 05/03/22   Transitional cell carcinoma, 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[place])    Tumor    "ON KIDNEY,REMOVED NO PROBLEMS SINCE",2014,RIGHT KIDNEY   Vitamin D deficiency     Past Surgical History:  Procedure Laterality Date   BREAST SURGERY     CARDIAC CATHETERIZATION     CATARACT EXTRACTION     CHOLECYSTECTOMY  1979   DG  BONE DENSITY ([place] [facility])     ENDOMETRIAL ABLATION     GASTRIC BYPASS     MASTECTOMY     OTHER SURGICAL HISTORY     CANCER SURGERY   TOTAL HIP ARTHROPLASTY Left 09/06/2021   Procedure: TOTAL HIP  ARTHROPLASTY;  Surgeon: Willaim Sheng, MD;  Location: [facility];  Service: Orthopedics;  Laterality: Left;    Family History  Problem Relation Age of Onset   Congestive Heart Failure Mother    Coronary artery disease Father    Heart attack Father 32       cause of death.   Diabetes Mellitus II Sister    Hypertension Sister    Breast cancer Sister    Migraines Neg Hx    Colon cancer Neg Hx    Esophageal cancer Neg Hx    Stomach cancer Neg Hx    Colon polyps Neg Hx    Crohn's disease Neg Hx    Rectal cancer Neg Hx    Ulcerative colitis Neg Hx     Social History   Socioeconomic History   Marital status: Married    Spouse name: Not on file   Number of children: Not on file   Years of education: Not on file   Highest education level: Not on file  Occupational History   Not on file  Tobacco Use   Smoking status: Former    Passive exposure: Never   Smokeless tobacco: Never   Tobacco comments:  Quit 30 yrs ago  Vaping Use   Vaping Use: Never used  Substance and Sexual Activity   Alcohol use: Yes    Alcohol/week: 14.0 standard drinks of alcohol    Types: 14 Glasses of wine per week    Comment: daily, 2glasses wine daily   Drug use: Never   Sexual activity: Not on file  Other Topics Concern   Not on file  Social History Narrative   Lives with husband in town home.   Caffiene coffee 1 cup, occasional sweet tea (rare)      Social Determinants of Health   Financial Resource Strain: Not on file  Food Insecurity: Not on file  Transportation Needs: Not on file  Physical Activity: Not on file  Stress: Not on file  Social Connections: Not on file  Intimate Partner Violence: Not on file    Outpatient Medications Prior to Visit  Medication Sig Dispense Refill   alendronate (FOSAMAX) 70 MG tablet 70 mg once a week.     amLODipine (NORVASC) 5 MG tablet Take 1 tablet (5 mg total) by mouth daily. 90 tablet 1   aspirin 81 MG EC tablet Take 1 tablet (81 mg total) by  mouth every other day. Swallow whole.  Hold aspirin while taking eliquis, (Patient taking differently: Take 81 mg by mouth daily. Swallow whole.  Hold aspirin while taking eliquis,) 30 tablet 0   Atogepant (QULIPTA) 60 MG TABS Take 60 mg by mouth daily. 30 tablet 5   carvedilol (COREG) 3.125 MG tablet Take 1 tablet (3.125 mg total) by mouth 2 (two) times daily. Hold if sbp less than 100 or heart rate less than 50 180 tablet 3   celecoxib (CELEBREX) 100 MG capsule Take 1 capsule by mouth twice a day with meals 28 capsule 0   ezetimibe (ZETIA) 10 MG tablet Take 1 tablet (10 mg total) by mouth daily. 90 tablet 3   famotidine (PEPCID) 40 MG tablet Take 1 tablet (40 mg total) by mouth as needed. 30 tablet 2   ferrous sulfate 325 (65 FE) MG EC tablet Take 1 tablet (325 mg total) by mouth every Monday, Wednesday, and Friday. 30 tablet 0   folic acid (FOLVITE) 1 MG tablet Take 1 tablet (1 mg total) by mouth daily.  3   hydrochlorothiazide (HYDRODIURIL) 25 MG tablet Take 1 tablet (25 mg total) by mouth daily as needed (swelling/fluid). 60 tablet 2   nystatin-triamcinolone ointment (MYCOLOG) 1 Topical Daily     pantoprazole (PROTONIX) 40 MG tablet Take 1 tablet (40 mg total) by mouth 2 (two) times daily before a meal. (Patient taking differently: Take 40 mg by mouth daily.) 60 tablet 2   Polyethylene Glycol 400 (BLINK TEARS) 0.25 % SOLN Place 1 drop into both eyes daily as needed (dry eyes).     sertraline (ZOLOFT) 100 MG tablet Take 1 tablet (100 mg total) by mouth daily. 30 tablet 3   simvastatin (ZOCOR) 20 MG tablet Take 1 tablet (20 mg total) by mouth at bedtime. 90 tablet 3   tiZANidine (ZANAFLEX) 4 MG tablet Take 1 tablet (4 mg total) by mouth at bedtime. 30 tablet 2   trimethobenzamide (TIGAN) 300 MG capsule Take 1 capsule (300 mg total) by mouth 3 (three) times daily. 15 capsule 0   Ubrogepant (UBRELVY) 100 MG TABS Take 100 mg by mouth daily as needed (migraine headaches). Can repeat x1 after 2 hours  if needed. Max dose 28m/24 hours 10 tablet 5   No facility-administered  medications prior to visit.    Allergies  Allergen Reactions   Contrast Media [Iodinated Contrast Media] Anaphylaxis and Hives   Ciprofloxacin Other (See Comments)    Interaction w other medicine   Erythromycin Base Nausea And Vomiting   Sulfabenzamide Other (See Comments)    UNK reaction    ROS Review of Systems  Constitutional:  Positive for fatigue. Negative for chills and fever.  Eyes:  Negative for visual disturbance.  Respiratory:  Negative for chest tightness and shortness of breath.   Neurological:  Positive for headaches. Negative for dizziness.  Psychiatric/Behavioral:  Positive for sleep disturbance.       Objective:    Physical Exam HENT:     Head: Normocephalic.     Mouth/Throat:     Mouth: Mucous membranes are moist.  Cardiovascular:     Rate and Rhythm: Normal rate.     Heart sounds: Normal heart sounds.  Pulmonary:     Effort: Pulmonary effort is normal.     Breath sounds: Normal breath sounds.  Musculoskeletal:     Comments: Mild swelling of the right lower extremity +2 pitting edema of the right ankle  Neurological:     Mental Status: She is alert.     BP (!) 152/98 (BP Location: Left Arm)   Pulse (!) 126   Ht _0  (1.422 m)   Wt 115 lb 1.3 oz (52.2 kg)   SpO2 95%   BMI 25.80 kg/m  Wt Readings from Last 3 Encounters:  07/06/22 115 lb 1.3 oz (52.2 kg)  06/27/22 107 lb 12.8 oz (48.9 kg)  06/24/22 114 lb 4 oz (51.8 kg)    Lab Results  Component Value Date   TSH 4.290 05/17/2022   Lab Results  Component Value Date   WBC 10.2 05/17/2022   HGB 11.6 05/17/2022   HCT 33.3 (L) 05/17/2022   MCV 100 (H) 05/17/2022   PLT 309 05/17/2022   Lab Results  Component Value Date   NA 137 05/17/2022   K 4.4 05/17/2022   CO2 23 05/17/2022   GLUCOSE 95 05/17/2022   BUN 8 05/17/2022   CREATININE 0.83 05/17/2022   BILITOT 0.5 12/15/2021   ALKPHOS 178 (H) 12/15/2021    AST 26 12/15/2021   ALT 13 12/15/2021   PROT 6.1 (L) 12/15/2021   ALBUMIN 2.3 (L) 12/15/2021   CALCIUM 9.2 05/17/2022   ANIONGAP 8 12/17/2021   EGFR 75 05/17/2022   GFR 72.00 06/16/2021   Lab Results  Component Value Date   CHOL 221 (H) 02/09/2022   Lab Results  Component Value Date   HDL 155 02/09/2022   Lab Results  Component Value Date   LDLCALC 52 02/09/2022   Lab Results  Component Value Date   TRIG 86 02/09/2022   Lab Results  Component Value Date   CHOLHDL 1.4 02/09/2022   Lab Results  Component Value Date   HGBA1C 5.1 02/09/2022      Assessment & Plan:  Primary hypertension Assessment & Plan: Uncontrolled Contributing factors likely due to lack of adequate sleep Patient reports that she was scheduled for a sleep study however she is awaiting approval from her insurance She reports getting a maximum 2 to 3 hours of sleep nightly She complains of a constant headache with elevated BP and fatigue Symptoms likely due to obstructive sleep apnea She reports taking amlodipine 5 mg daily and hydrochlorothiazide 25 mg as needed for prophy edema Encouraged the patient to continue to monitor her blood pressure  daily and take half a tablet of hydralazine 25 mg if her BP is greater than 140/90 Patient verbalized understanding and is well acquainted with the plan of care    Migraine without aura and without status migrainosus, not intractable Assessment & Plan: She has follow-up with neurology Patient is currently on abortive treatment of Ubrelvy 100  mg prn and prophylactic treatment of  Atogepant 60 mg daily She reports compliant with treatment regimen, however she still complains of a constant headache Headache likely due to underlying OSA Instructed patient to follow-up with her insurance for approval for her sleep study Encouraged to continue on current treatment regimen and follow up with neurology as needed   Sleep disturbance Assessment & Plan: She  reports sleeping 2 to 3 hours nightly noting that the longest that she has slept was 4 hours She reports taking melatonin with minimal relief of her symptoms Encourage patient to follow-up with her insurance for approval for sleep study, as treatment with CPAP would be beneficial in providing adequate rest   Vitamin D deficiency -     VITAMIN D 25 Hydroxy (Vit-D Deficiency, Fractures)  IFG (impaired fasting glucose) -     Hemoglobin A1c  Other specified hypothyroidism -     TSH + free T4  Other hyperlipidemia -     Lipid panel -     CMP14+EGFR -     CBC with Differential/Platelet    Follow-up: Return in about 1 month (around 08/06/2022) for BP.   [name], FNP

## 2022-07-06 NOTE — Assessment & Plan Note (Addendum)
Uncontrolled Contributing factors likely due to lack of adequate sleep Patient reports that she was scheduled for a sleep study however she is awaiting approval from her insurance She reports getting a maximum 2 to 3 hours of sleep nightly She complains of a constant headache with elevated BP and fatigue Symptoms likely due to obstructive sleep apnea She reports taking amlodipine 5 mg daily and hydrochlorothiazide 25 mg as needed for prophy edema Encouraged the patient to continue to monitor her blood pressure daily and take half a tablet of hydralazine 25 mg if her BP is greater than 140/90 Patient verbalized understanding and is well acquainted with the plan of care

## 2022-07-06 NOTE — Assessment & Plan Note (Addendum)
She has follow-up with neurology Patient is currently on abortive treatment of Ubrelvy 100  mg prn and prophylactic treatment of  Atogepant 60 mg daily She reports compliant with treatment regimen, however she still complains of a constant headache Headache likely due to underlying OSA Instructed patient to follow-up with her insurance for approval for her sleep study Encouraged to continue on current treatment regimen and follow up with neurology as needed

## 2022-07-06 NOTE — Patient Instructions (Addendum)
I appreciate the opportunity to provide care to you today!    Follow up:  1 months  BP  Labs: please stop by the lab during the week to get your blood drawn (CBC, CMP, TSH, Lipid profile, HgA1c, Vit D)  Please pick up your medication at the pharmacy  I recommend taking 1/2 a tablet of hydrochlorothiazide 25 mg if your blood pressure is greater than 140/90  Please pick up your medication at the pharmacy and take at bedtime to help you sleep.  Please follow-up with your insurance company regarding your sleep study     Please continue to a heart-healthy diet and increase your physical activities. Try to exercise for 58mns at least three times a week.      It was a pleasure to see you and I look forward to continuing to work together on your health and well-being. Please do not hesitate to call the office if you need care or have questions about your care.   Have a wonderful day and week. With Gratitude, GAlvira MondayMSN, FNP-BC

## 2022-07-07 ENCOUNTER — Other Ambulatory Visit: Payer: Self-pay | Admitting: Family Medicine

## 2022-07-07 ENCOUNTER — Telehealth: Payer: Self-pay | Admitting: Family Medicine

## 2022-07-07 DIAGNOSIS — I10 Essential (primary) hypertension: Secondary | ICD-10-CM

## 2022-07-07 MED ORDER — HYDROCHLOROTHIAZIDE 12.5 MG PO TABS: 12.5000 mg | ORAL_TABLET | ORAL | 0 refills | Status: DC | PRN

## 2022-07-07 NOTE — Telephone Encounter (Signed)
I spoke with the patient and inform her not to take Vistaril 25 mg due to adverse effects of QT prolongation. Patient verbalized understanding. Inform the patient that a prescription for hydrochlorothiazide 12.5 mg is sent to her pharmacy to take only if her blood pressure  is >140/90

## 2022-07-08 DIAGNOSIS — M1612 Unilateral primary osteoarthritis, left hip: Secondary | ICD-10-CM | POA: Diagnosis not present

## 2022-07-19 ENCOUNTER — Ambulatory Visit: Payer: Medicare PPO | Admitting: Neurology

## 2022-07-19 DIAGNOSIS — R634 Abnormal weight loss: Secondary | ICD-10-CM

## 2022-07-19 DIAGNOSIS — Z8669 Personal history of other diseases of the nervous system and sense organs: Secondary | ICD-10-CM

## 2022-07-19 DIAGNOSIS — R519 Headache, unspecified: Secondary | ICD-10-CM

## 2022-07-19 DIAGNOSIS — R351 Nocturia: Secondary | ICD-10-CM

## 2022-07-19 DIAGNOSIS — R0683 Snoring: Secondary | ICD-10-CM

## 2022-07-19 DIAGNOSIS — R0681 Apnea, not elsewhere classified: Secondary | ICD-10-CM

## 2022-07-19 DIAGNOSIS — Z9884 Bariatric surgery status: Secondary | ICD-10-CM

## 2022-07-19 DIAGNOSIS — G4719 Other hypersomnia: Secondary | ICD-10-CM

## 2022-07-21 DIAGNOSIS — L218 Other seborrheic dermatitis: Secondary | ICD-10-CM | POA: Diagnosis not present

## 2022-07-21 DIAGNOSIS — L03012 Cellulitis of left finger: Secondary | ICD-10-CM | POA: Diagnosis not present

## 2022-07-21 DIAGNOSIS — L308 Other specified dermatitis: Secondary | ICD-10-CM | POA: Diagnosis not present

## 2022-07-28 ENCOUNTER — Ambulatory Visit: Payer: Medicare PPO | Admitting: Gastroenterology

## 2022-08-01 DIAGNOSIS — M6281 Muscle weakness (generalized): Secondary | ICD-10-CM | POA: Diagnosis not present

## 2022-08-01 DIAGNOSIS — I5032 Chronic diastolic (congestive) heart failure: Secondary | ICD-10-CM | POA: Diagnosis not present

## 2022-08-01 DIAGNOSIS — R2689 Other abnormalities of gait and mobility: Secondary | ICD-10-CM | POA: Diagnosis not present

## 2022-08-02 ENCOUNTER — Ambulatory Visit: Payer: Medicare PPO | Admitting: Neurology

## 2022-08-02 ENCOUNTER — Telehealth: Payer: Self-pay | Admitting: Neurology

## 2022-08-02 DIAGNOSIS — H8112 Benign paroxysmal vertigo, left ear: Secondary | ICD-10-CM

## 2022-08-02 NOTE — Telephone Encounter (Signed)
She can e sent for vestibular rehab, that's fine again -  you good with that JM? thanks

## 2022-08-02 NOTE — Telephone Encounter (Signed)
Pt has severe Vertigo and husband is looking for her to get therapy which she has had it before. Chart shows Dr. Jaynee Eagles saw her for Vertigo so not sure if she needs an appt. or if she can get a referral for Pt. Please call her husband to discuss. She has had the Vertigo for past 2 days and is unable to do anything.

## 2022-08-02 NOTE — Telephone Encounter (Signed)
I called pt and she said that when she places her head to back  and left, she has dizziness for seconds then goes away.  Same issue as previous.  Would you need to see pt again or refer to PT for BPPV treatment

## 2022-08-03 NOTE — Telephone Encounter (Signed)
V.o. placed for vestibular therapy w/ PT. I called patient and confirmed she wants to use neuro rehab next door. She will call them to schedule. She was very Patent attorney.

## 2022-08-03 NOTE — Addendum Note (Signed)
Addended by: Gildardo Griffes on: 08/03/2022 09:38 AM   Modules accepted: Orders

## 2022-08-03 NOTE — Telephone Encounter (Signed)
Okay to place referral for vestibular rehab. At prior visit, main concern was migraine headaches so vertigo/dizziness was not addressed but if this has reoccurred, okay to place referral. Thank you.

## 2022-08-10 NOTE — Progress Notes (Unsigned)
[facility] [address] [place]. [place] [address] [phone]       OFFICE FOLLOW UP NOTE  [name] Date of Birth:  1951/02/22 Medical Record Number:  [id number]   Primary neurologist: Heather Garrett Reason for visit: Headaches/migraines    SUBJECTIVE:   CHIEF COMPLAINT:  No chief complaint on file.   HPI:   Update 08/11/2022 Heather Garrett: Patient returns for 12-monthfollow-up.  At prior visit, started on Qulipta for prevention and Ubrelvy for rescue.    Evaluated by Dr. ARexene Albertsfor possible underlying sleep apnea, HST not yet scheduled ***     History provided for reference purposes only Update 05/02/2022 Heather Garrett: Patient returns for acute visit due to worsening headaches.  She was previously seen over 1 year ago with Dr. AJaynee Eaglesand mainly discussed dizziness at that time as this was her main concern.   She was referred for sleep evaluation at prior visit and was scheduled for initial evaluation last year but were canceled by provider and no showed appointment in December.  She reports persistent headaches since prior visit with some worsening over the past 6 months. Reports posterior/occipital location, present daily upon awakening, can have more severe headaches at times, about 4-5 per month that can last 2-3 days. Does have occasional visual aura (bright sparkling lights) that last short duration. These severe headaches are debilitating and she will usually need to lay down.  Can have photophobia and phonophobia.  Denies N/V. Use of Tylenol 500 mg twice daily and occasionally extra dose during the day with severe headaches. She does have rx for Nurtec, will take the edge off but headache will return after couple hours.  She does have chronic history of migraines, previously frontal/temporal, previously tried hydrocodone and topiramate, use of Imitrex subcu injections as needed.  She is currently on sertraline for depression.  She has been closely following with  PCP/cardiology for HTN which she reports can fluctuate, today's pressure elevated but she has not yet taken BP meds today.  Reports having difficulty currently receiving medications for headaches due to underlying history of CHF.  She also notes back in June, she fell requiring left hip replacement and unfortunately Heather couple months later fell again with Heather hairline fracture of left hip and currently with limited mobility.  Consult visit 03/23/2021 Heather Garrett Heather Garrett Heather 72y.o. female here as requested by Heather Garrett for headaches.  Past medical history new daily persistent headache, prior bariatric surgery, fibromyalgia, hypertension, breast cancer, GERD, hyperlipidemia, coronary artery disease, renal cell cancer, migraine headache, history of bladder cancer, major depression, history of breast cancer, migraine without aura.  She is currently on topiramate.   She gets dizzy when standing up. She started waking up with headaches 3-4 months ago. Her biggest concern right now is dizziness on standing. If she rolls to the left she has nausea and dizziness, doesn't affect her to the right but affects her to left. When she rolls over on the left she gets dizzy, room spins, more nausea, brief. She has had migraines in the past, but denies migraines now, she wake up with Heather slight headache. She does not sleep well. She had Heather sleep study years ago and used Heather cpap but lost Heather lot of weight and stopped using it, she doesn't sleep well, she is extremely fatigued, doesn't feel refreshed in the morning, wakes often.No other focal neurologic deficits, associated symptoms, inciting events or modifiable factors.   Reviewed notes,  labs and imaging from outside physicians, which showed fatigue   TSH normal 3.03 Vit D very low 10.7   IMPRESSION: personally reviewed image and agree No evidence of acute intracranial abnormality.   Mild-to-moderate chronic small vessel ischemic changes within the cerebral  white matter, and to Heather lesser degree within the pons.   Mild-to-moderate generalized parenchymal atrophy.       ROS:   14 system review of systems performed and negative with exception of those listed in HPI  PMH:  Past Medical History:  Diagnosis Date   Allergy    SEASONAL   Anemia    "YEAR IN HALF AGO "   Anxiety    Arthritis    OSTEOARTHRITIS   Biceps tendinitis    Bladder cancer ([place]) 2014   Breast cancer ([place]) 1990   Cataract    [place] 2 YEARS AGO UPDATED 05/03/22   Chronic diastolic CHF (congestive heart failure) (HCC)    Cobalamin deficiency    Coronary arteriosclerosis in native artery    Diabetes mellitus without complication ([place])    "YEARS AGO WHEN i WAS OVER WEIGHTBUT NOT NOW" UPDATED 05/03/22   Elevated liver function tests    GERD (gastroesophageal reflux disease)    "Heather LITTLE"   Heart murmur    Hiatal hernia    Hyperlipidemia    Hypertension    Migraine    Myocardial infarction ([place])    Rotator cuff syndrome    Sleep apnea    "YES BUT RESOLVED",UPDATED 05/03/22   Transitional cell carcinoma, 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[place])    Tumor    "ON KIDNEY,REMOVED NO PROBLEMS SINCE",2014,RIGHT KIDNEY   Vitamin D deficiency     PSH:  Past Surgical History:  Procedure Laterality Date   BREAST SURGERY     CARDIAC CATHETERIZATION     CATARACT EXTRACTION     CHOLECYSTECTOMY  1979   DG  BONE DENSITY ([facility])     ENDOMETRIAL ABLATION     GASTRIC BYPASS     MASTECTOMY     OTHER SURGICAL HISTORY     CANCER SURGERY   TOTAL HIP ARTHROPLASTY Left 09/06/2021   Procedure: TOTAL HIP ARTHROPLASTY;  Surgeon: Willaim Sheng, Garrett;  Location: [facility];  Service: Orthopedics;  Laterality: Left;    Social History:  Social History   Socioeconomic History   Marital status: Married    Spouse name: Not on file   Number of children: Not on file   Years of education: Not on file   Highest education level: Not on file  Occupational History   Not on file  Tobacco Use    Smoking status: Former    Passive exposure: Never   Smokeless tobacco: Never   Tobacco comments:    Quit 30 yrs ago  Vaping Use   Vaping Use: Never used  Substance and Sexual Activity   Alcohol use: Yes    Alcohol/week: 14.0 standard drinks of alcohol    Types: 14 Glasses of wine per week    Comment: daily, 2glasses wine daily   Drug use: Never   Sexual activity: Not on file  Other Topics Concern   Not on file  Social History Narrative   Lives with husband in town home.   Caffiene coffee 1 cup, occasional sweet tea (rare)      Social Determinants of Health   Financial Resource Strain: Not on file  Food Insecurity: Not on file  Transportation Needs: Not on file  Physical Activity: Not on file  Stress: Not on file  Social Connections: Not on file  Intimate Partner Violence: Not on file    Family History:  Family History  Problem Relation Age of Onset   Congestive Heart Failure Mother    Coronary artery disease Father    Heart attack Father 73       cause of death.   Diabetes Mellitus II Sister    Hypertension Sister    Breast cancer Sister    Migraines Neg Hx    Colon cancer Neg Hx    Esophageal cancer Neg Hx    Stomach cancer Neg Hx    Colon polyps Neg Hx    Crohn's disease Neg Hx    Rectal cancer Neg Hx    Ulcerative colitis Neg Hx     Medications:   Current Outpatient Medications on File Prior to Visit  Medication Sig Dispense Refill   hydrochlorothiazide (HYDRODIURIL) 12.5 MG tablet Take 1 tablet (12.5 mg total) by mouth as needed (take 1 tablet if your BP is >140/90). 90 tablet 0   alendronate (FOSAMAX) 70 MG tablet 70 mg once Heather week.     amLODipine (NORVASC) 5 MG tablet Take 1 tablet (5 mg total) by mouth daily. 90 tablet 1   aspirin 81 MG EC tablet Take 1 tablet (81 mg total) by mouth every other day. Swallow whole.  Hold aspirin while taking eliquis, (Patient taking differently: Take 81 mg by mouth daily. Swallow whole.  Hold aspirin while taking  eliquis,) 30 tablet 0   Atogepant (QULIPTA) 60 MG TABS Take 60 mg by mouth daily. 30 tablet 5   carvedilol (COREG) 3.125 MG tablet Take 1 tablet (3.125 mg total) by mouth 2 (two) times daily. Hold if sbp less than 100 or heart rate less than 50 180 tablet 3   celecoxib (CELEBREX) 100 MG capsule Take 1 capsule by mouth twice Heather day with meals 28 capsule 0   ezetimibe (ZETIA) 10 MG tablet Take 1 tablet (10 mg total) by mouth daily. 90 tablet 3   famotidine (PEPCID) 40 MG tablet Take 1 tablet (40 mg total) by mouth as needed. 30 tablet 2   ferrous sulfate 325 (65 FE) MG EC tablet Take 1 tablet (325 mg total) by mouth every Monday, Wednesday, and Friday. 30 tablet 0   folic acid (FOLVITE) 1 MG tablet Take 1 tablet (1 mg total) by mouth daily.  3   hydrochlorothiazide (HYDRODIURIL) 25 MG tablet Take 1 tablet (25 mg total) by mouth daily as needed (swelling/fluid). 60 tablet 2   nystatin-triamcinolone ointment (MYCOLOG) 1 Topical Daily     pantoprazole (PROTONIX) 40 MG tablet Take 1 tablet (40 mg total) by mouth 2 (two) times daily before Heather meal. (Patient taking differently: Take 40 mg by mouth daily.) 60 tablet 2   Polyethylene Glycol 400 (BLINK TEARS) 0.25 % SOLN Place 1 drop into both eyes daily as needed (dry eyes).     sertraline (ZOLOFT) 100 MG tablet Take 1 tablet (100 mg total) by mouth daily. 30 tablet 3   simvastatin (ZOCOR) 20 MG tablet Take 1 tablet (20 mg total) by mouth at bedtime. 90 tablet 3   tiZANidine (ZANAFLEX) 4 MG tablet Take 1 tablet (4 mg total) by mouth at bedtime. 30 tablet 2   trimethobenzamide (TIGAN) 300 MG capsule Take 1 capsule (300 mg total) by mouth 3 (three) times daily. 15 capsule 0   Ubrogepant (UBRELVY) 100 MG TABS Take 100 mg by mouth daily as needed (migraine  headaches). Can repeat x1 after 2 hours if needed. Max dose '200mg'$ /24 hours 10 tablet 5   No current facility-administered medications on file prior to visit.    Allergies:   Allergies  Allergen Reactions    Contrast Media [Iodinated Contrast Media] Anaphylaxis and Hives   Ciprofloxacin Other (See Comments)    Interaction w other medicine   Erythromycin Base Nausea And Vomiting   Sulfabenzamide Other (See Comments)    UNK reaction      OBJECTIVE:  Physical Exam  There were no vitals filed for this visit.  There is no height or weight on file to calculate BMI. No results found.   General: well developed, well nourished, very pleasant elderly Caucasian female, seated, in no evident distress Head: head normocephalic and atraumatic.   Neck: supple with no carotid or supraclavicular bruits Cardiovascular: regular rate and rhythm, no murmurs Skin:  no rash/petichiae Vascular:  Normal pulses all extremities   Neurologic Exam Mental Status: Awake and fully alert. Oriented to place and time. Recent and remote memory intact. Attention span, concentration and fund of knowledge appropriate. Mood and affect appropriate.  Cranial Nerves: Pupils equal, briskly reactive to light. Extraocular movements full without nystagmus. Visual fields full to confrontation. Hearing intact. Facial sensation intact. Face, tongue, palate moves normally and symmetrically.  Motor: Normal bulk and tone. Normal strength in all tested extremity muscles except limited testing in LLE Sensory.: intact to touch , pinprick , position and vibratory sensation.  Coordination: Rapid alternating movements normal in all extremities. Finger-to-nose and heel-to-shin performed accurately bilaterally. Gait and Station: Deferred Reflexes: 1+ and symmetric. Toes downgoing.         ASSESSMENT/PLAN: Heather Garrett is Heather 71 y.o. year old female   1.  Chronic headaches  -Acute mixed features with tension and migrainous type, daily tension type headaches and about 12-15 migrainous type headaches per month  -Start Qulipta 60 mg daily for preventative  -Start Ubrelvy 100 mg as needed for abortive therapy, can repeat x1 after 2  hours if needed, max dose '200mg'$ /24 hrs  -previously tried/failed: Topiramate, hydrocodone, sumatriptan, Nurtec  -Unable to place on beta-blockers due to cardiac history, hesitant to trial antidepressants due to current use of sertraline.  Not Heather candidate for triptans due to cardiac history  -Suspect analgesic rebound headache contributing, discussed importance of limiting OTC pain reliever usage  -Referral placed to [facility] to rule out sleep apnea due to awakening daily with headaches, daytime fatigue, nocturia and insomnia  -Discussed importance of adequate blood pressure control as fluctuation and high blood pressure can contribute to headaches, she is closely being followed by PCP    Follow up in 3 months or call earlier if needed    CC:  PCP: [name], FNP    I spent 34 minutes of face-to-face and non-face-to-face time with patient.  This included previsit chart review, lab review, study review, order entry, electronic health record documentation, patient education regarding above diagnoses and treatment plan and answered all the questions to patient satisfaction   Frann Rider, [facility]  [facility] [address] [place], [place] [address]  Phone [phone] Fax [phone] Note: This document was prepared with digital dictation and possible smart phrase technology. Any transcriptional errors that result from this process are unintentional.

## 2022-08-11 ENCOUNTER — Encounter: Payer: Self-pay | Admitting: Adult Health

## 2022-08-11 ENCOUNTER — Ambulatory Visit: Payer: Medicare PPO | Admitting: Adult Health

## 2022-08-11 ENCOUNTER — Telehealth: Payer: Self-pay

## 2022-08-11 VITALS — BP 113/70 | HR 76 | Ht <= 58 in | Wt 112.0 lb

## 2022-08-11 DIAGNOSIS — H8112 Benign paroxysmal vertigo, left ear: Secondary | ICD-10-CM | POA: Diagnosis not present

## 2022-08-11 DIAGNOSIS — G43E09 Chronic migraine with aura, not intractable, without status migrainosus: Secondary | ICD-10-CM | POA: Diagnosis not present

## 2022-08-11 DIAGNOSIS — R29818 Other symptoms and signs involving the nervous system: Secondary | ICD-10-CM

## 2022-08-11 DIAGNOSIS — Z8669 Personal history of other diseases of the nervous system and sense organs: Secondary | ICD-10-CM

## 2022-08-11 DIAGNOSIS — Z9884 Bariatric surgery status: Secondary | ICD-10-CM

## 2022-08-11 MED ORDER — QULIPTA 60 MG PO TABS: 60.0000 mg | ORAL_TABLET | Freq: Every day | ORAL | 11 refills | Status: DC

## 2022-08-11 NOTE — Telephone Encounter (Signed)
Please call patient and advise her that she failed a home sleep test with no valid data on the recent test attempt.  We can proceed with a laboratory sleep study if she would be willing to come in and stay overnight for testing.  We had pursued the home sleep test because of her mobility restrictions, please verify that she would be able to come in and stay overnight for a laboratory attended sleep study.  I would be happy to order it.

## 2022-08-11 NOTE — Patient Instructions (Addendum)
Your Plan:  Continue Qulipta for migraine prevention, continue Ubrelvy for rescue  start vestibular therapy for vertigo - if no improvement after a couple weeks please let us know  You will be contacted regarding your sleep study - both home tests came back inconclusive meaning you were not able to get data from these to determine if you have sleep apnea. Our sleep lab is currently working with insurance about repeating a sleep study but this will need to be completed in our lab. You should hear from our sleep lab within the next 1-2 weeks to discuss this further    Follow up in 6 months or call earlier if needed        Thank you for coming to see [place] at [facility]. I hope we have been able to provide you high quality care today.  You may receive a patient satisfaction survey over the next few weeks. We would appreciate your feedback and comments so that we may continue to improve ourselves and the health of our patients.

## 2022-08-11 NOTE — Telephone Encounter (Signed)
Pt has had two failed home sleep tests. On 07/19/22 and 08/02/22, hst's came back with no valid data. Would you like for patient to know try in lab sleep study?  If so, can you place an order for NPSG?  Thanks!

## 2022-08-12 ENCOUNTER — Ambulatory Visit (HOSPITAL_COMMUNITY): Payer: Medicare PPO | Attending: Neurology

## 2022-08-12 ENCOUNTER — Other Ambulatory Visit: Payer: Self-pay

## 2022-08-12 DIAGNOSIS — R2681 Unsteadiness on feet: Secondary | ICD-10-CM | POA: Insufficient documentation

## 2022-08-12 DIAGNOSIS — H8112 Benign paroxysmal vertigo, left ear: Secondary | ICD-10-CM | POA: Diagnosis not present

## 2022-08-12 DIAGNOSIS — M6281 Muscle weakness (generalized): Secondary | ICD-10-CM | POA: Insufficient documentation

## 2022-08-12 DIAGNOSIS — R42 Dizziness and giddiness: Secondary | ICD-10-CM | POA: Insufficient documentation

## 2022-08-12 NOTE — Therapy (Signed)
OUTPATIENT PHYSICAL THERAPY VESTIBULAR EVALUATION     Patient Name: Heather Garrett MRN: [id number] DOB:November 01, 1950, 72 y.o., female Today's Date: 08/12/2022  END OF SESSION:  PT End of Session - 08/12/22 1134     Visit Number 1    Number of Visits 6    Date for PT Re-Evaluation 09/02/22    Authorization Type [employer] Choice    Authorization Time Period auth submitted please check    PT Start Time 1130   late arrival   PT Stop Time 1200    PT Time Calculation (min) 30 min    Activity Tolerance Patient tolerated treatment well    Behavior During Therapy WFL for tasks assessed/performed             Past Medical History:  Diagnosis Date   Allergy    SEASONAL   Anemia    "YEAR IN HALF AGO "   Anxiety    Arthritis    OSTEOARTHRITIS   Biceps tendinitis    Bladder cancer ([place]) 2014   Breast cancer (Heather Garrett) [place] 2 YEARS AGO UPDATED 05/03/22   Chronic diastolic CHF (congestive heart failure) (H[facility])    Cobalamin deficiency    Coronary arteriosclerosis in native artery    Diabetes mellitus without complication ([place])    "YEARS AGO WHEN i WAS OVER WEIGHTBUT NOT NOW" UPDATED 05/03/22   Elevated liver function tests    GERD (gastroesophageal reflux disease)    "A LITTLE"   Heart murmur    Hiatal hernia    Hyperlipidemia    Hypertension    Migraine    Myocardial infarction ([place])    Rotator cuff syndrome    Sleep apnea    "YES BUT RESOLVED",UPDATED 05/03/22   Transitional cell carcinoma, bladder (Heather Garrett)    Tumor    "ON KIDNEY,REMOVED NO PROBLEMS SINCE",2014,RIGHT KIDNEY   Vitamin D deficiency    Past Surgical History:  Procedure Laterality Date   BREAST SURGERY     CARDIAC CATHETERIZATION     CATARACT EXTRACTION     CHOLECYSTECTOMY  1979   DG  BONE DENSITY ([place] [facility])     ENDOMETRIAL ABLATION     GASTRIC BYPASS     MASTECTOMY     OTHER SURGICAL HISTORY     CANCER SURGERY   TOTAL HIP ARTHROPLASTY Left 09/06/2021   Procedure:  TOTAL HIP ARTHROPLASTY;  Surgeon: Willaim Sheng, MD;  Location: [facility];  Service: Orthopedics;  Laterality: Left;   Patient Active Problem List   Diagnosis Date Noted   Sleep disturbance 07/06/2022   Hordeolum of left lower eyelid 06/27/2022   Positive screening for depression on 9-item Patient Health Questionnaire (PHQ-9) 06/27/2022   Nausea and vomiting 03/17/2022   Left ankle pain 02/20/2022   Acute respiratory failure with hypoxia ([place]) 12/10/2021   Severe sepsis ([place]) 12/10/2021   Multifocal pneumonia 12/10/2021   GERD (gastroesophageal reflux disease) 12/10/2021   Depression 12/10/2021   Chronic diastolic CHF (congestive heart failure) ([name]) 12/10/2021   Chronic anemia 12/10/2021   UTI (urinary tract infection) 11/28/2021   Prolonged QT interval 11/28/2021   Normocytic anemia 09/05/2021   Hyperkalemia 09/05/2021   Triggering of finger 05/17/2021   Heart disease 05/17/2021   Malignant tumor of breast (Heather Garrett) 05/17/2021   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[place]) 05/17/2021   Hypertensive disorder 05/17/2021   Lower extremity edema 05/14/2021   Benign paroxysmal positional vertigo of left ear 03/23/2021   Daytime somnolence 03/23/2021  History of sleep apnea 03/23/2021   Orthostatic hypotension 03/23/2021   Migraine without aura and without status migrainosus, not intractable 03/23/2021   Coronary arteriosclerosis in native artery 09/28/2016   Elevated LFTs 09/28/2016   Rotator cuff syndrome 09/28/2016   Biceps tendinitis 09/28/2016   Cobalamin deficiency 09/28/2016   Hyperlipidemia 09/28/2016   Migraine 09/28/2016   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) 09/28/2016   Vitamin D deficiency 09/28/2016    PCP: [name], FNP REFERRING PROVIDER: Melvenia Beam, MD[facility]  REFERRING DIAG: H81.12 (ICD-10-CM) - Benign paroxysmal positional vertigo of left ear  THERAPY DIAG:  Dizziness and giddiness  Unsteadiness on feet  Muscle  weakness (generalized)  ONSET DATE: a month; worsening over the last week  Rationale for Evaluation and Treatment: Rehabilitation  SUBJECTIVE:   SUBJECTIVE STATEMENT: Patient reports worsening dizziness especially sitting up out of bed.;  has had BPPV before Pt accompanied by: significant other  PERTINENT HISTORY: history of migraine; THA in March of last year; fall in June of last year with pelvis fx; WC bound  PAIN:  Are you having pain? Yes: NPRS scale: 7/10 Pain location: left side hip Pain description: sore; weak Aggravating factors: standing, movement Relieving factors: sit and rest  PRECAUTIONS: Fall  WEIGHT BEARING RESTRICTIONS: No  FALLS: Has patient fallen in last 6 months? Yes. Number of falls 1  LIVING ENVIRONMENT: Lives with: lives with their spouse Lives in: House/apartment Stairs:  unknown Has following equipment at home: Environmental consultant - 2 wheeled and Wheelchair (manual)  PLOF: Needs assistance with ADLs, Needs assistance with gait, and Needs assistance with transfers  PATIENT GOALS: not to be dizzy  OBJECTIVE:   DIAGNOSTIC FINDINGS:   CLINICAL DATA:  Left hip pain after fall in March of 2023. History of breast cancer   EXAM: CT OF THE LEFT HIP WITHOUT CONTRAST   TECHNIQUE: Multidetector CT imaging of the left hip was performed according to the standard protocol. Multiplanar CT image reconstructions were also generated.   RADIATION DOSE REDUCTION: This exam was performed according to the departmental dose-optimization program which includes automated exposure control, adjustment of the mA and/or kV according to patient size and/or use of iterative reconstruction technique.   COMPARISON:  X-ray 03/02/2022, CT 09/06/2021   FINDINGS: Bones/Joint/Cartilage   Postsurgical changes of left total hip arthroplasty. Arthroplasty components are in their expected alignment without dislocation. No periprosthetic lucency. There is an acute to subacute  appearing nondisplaced periprosthetic fracture involving the intertrochanteric and proximal subtrochanteric femur (series 6, images 47-54). No additional fractures. Healed right superior and inferior pubic rami fractures.   Ligaments   Suboptimally assessed by CT.   Muscles and Tendons   Generalized muscle atrophy, particularly affecting the gluteal musculature. No acute musculotendinous abnormality.   Soft tissues   No organized fluid collection or hematoma. No left inguinal lymphadenopathy. Extensive atherosclerotic vascular calcifications.   IMPRESSION: Acute to subacute appearing nondisplaced periprosthetic fracture involving the intertrochanteric and proximal subtrochanteric left femur.   These results will be called to the ordering clinician or representative by the Radiologist Assistant, and communication documented in the PACS or [employer].     Electronically Signed   By: Davina Poke D.O.   On: 03/19/2022 13:32   COGNITION: Overall cognitive status: Within functional limits for tasks assessed   SENSATION: Not tested  EDEMA:  Noted swelling left leg  POSTURE:  rounded shoulders, forward head, and flexed trunk  in WC  Cervical ROM:    Active AROM (deg)  eval  Flexion wfl  Extension "Dizzy"  Right lateral flexion wfl  Left lateral flexion wfl  Right rotation wfl  Left rotation wfl  (Blank rows = not tested)  STRENGTH: not tested  LOWER EXTREMITY MMT: bilateral leg weakness left leg > right leg  MMT Right eval Left eval  Hip flexion    Hip abduction    Hip adduction    Hip internal rotation    Hip external rotation    Knee flexion    Knee extension    Ankle dorsiflexion    Ankle plantarflexion    Ankle inversion    Ankle eversion    (Blank rows = not tested)  BED MOBILITY:  Sit to supine Min A Supine to sit Min A Rolling to Left Min A  TRANSFERS: Assistive device utilized: Wheelchair (manual)  Sit to stand: Min  A Stand to sit: Min A Chair to chair: Min A GAIT: Gait pattern: decreased step length- Right and decreased step length- Left Distance walked: 2 ft Assistive device utilized:  PT hand held assist and None Level of assistance: Min A Comments: patient at risk for falls  FUNCTIONAL TESTS:  30 seconds chair stand test x 0  PATIENT SURVEYS:  DHI 66  (severe handicap)  VESTIBULAR ASSESSMENT:  GENERAL OBSERVATION: glasses   SYMPTOM BEHAVIOR:  Subjective history: see above  Non-Vestibular symptoms: migraine symptoms  Type of dizziness: Spinning/Vertigo  Frequency: varies  Duration: brief  Aggravating factors: Induced by position change: supine to sit and Induced by motion: turning body quickly and turning head quickly  Relieving factors: medication and closing eyes  Progression of symptoms: worse  OCULOMOTOR EXAM:  Ocular Alignment: normal  Ocular ROM: No Limitations  Spontaneous Nystagmus: absent  Gaze-Induced Nystagmus: absent  Smooth Pursuits: intact  Saccades:  not tested  Convergence/Divergence: not measured cm     VESTIBULAR - OCULAR REFLEX: not tested    POSITIONAL TESTING: Right Dix-Hallpike: upbeating, right nystagmus  MOTION SENSITIVITY:  Motion Sensitivity Quotient Intensity: 0 = none, 1 = Lightheaded, 2 = Mild, 3 = Moderate, 4 = Severe, 5 = Vomiting  Intensity  1. Sitting to supine 1  2. Supine to L side 3  3. Supine to R side   4. Supine to sitting 2  5. L Hallpike-Dix   6. Up from L    7. R Hallpike-Dix 3  8. Up from R    9. Sitting, head tipped to L knee   10. Head up from L knee   11. Sitting, head tipped to R knee   12. Head up from R knee   13. Sitting head turns x5   14.Sitting head nods x5   15. In stance, 180 turn to L    16. In stance, 180 turn to R     OTHOSTATICS: not done  FUNCTIONAL GAIT: 30 seconds chair stand test 0   VESTIBULAR TREATMENT:                                                                                                    DATE: 08/12/2022 physical  therapy evaluation  Canalith Repositioning:  Epley Right: Number of Reps: 1 and Response to Treatment: comment: dizziness with turn to the left; no dizziness on sitting but nauseated  PATIENT EDUCATION: Education details: Patient educated on exam findings, POC, scope of PT, HEP, and what to expect next visit. Person educated: Patient Education method: Explanation, Demonstration, and Handouts Education comprehension: verbalized understanding, returned demonstration, verbal cues required, and tactile cues required   HOME EXERCISE PROGRAM:  GOALS: Goals reviewed with patient? No  SHORT TERM GOALS: Target date: 08/26/2022  Patient will self report 50% improvement to improve tolerance for functional activity  Baseline: Goal status: INITIAL     LONG TERM GOALS: Target date: 09/02/2022  Patient will improve DHI score ( decrease by 20 points) to demonstrate decreased dizziness with improved functional mobility  Baseline: 66 Goal status: INITIAL  2.  Patient will be free of falls Baseline:  Goal status: INITIAL  3.  Patient will have a negative DHP to the right to improve ability to sleep and get up from sleeping without dizziness Baseline:  Goal status: INITIAL  4.  Patient will self report 75% improvement to improve tolerance for functional activity  Baseline:  Goal status: INITIAL    ASSESSMENT:  CLINICAL IMPRESSION: Late arrival for evaluation today so testing time is limited.  Patient is WC bound and needs min A for transfer to the mat for evaluation.  Patient is a 72 y.o. female who was seen today for physical therapy evaluation and treatment for vertigo. Patient presents to physical therapy with complaint of dizziness with turning her head; leaning her head back. Patient demonstrates increased vestibular symptoms with provocative testing which is negatively impacting patient ability to perform ADLs and functional mobility tasks.  Patient will benefit from skilled physical therapy services to address these deficits to improve level of function with ADLs, functional mobility tasks, and reduce risk for falls.    OBJECTIVE IMPAIRMENTS: Abnormal gait, decreased activity tolerance, decreased balance, decreased endurance, decreased mobility, difficulty walking, decreased ROM, decreased strength, dizziness, hypomobility, increased fascial restrictions, impaired perceived functional ability, increased muscle spasms, impaired flexibility, and pain.   ACTIVITY LIMITATIONS: carrying, lifting, bending, sitting, standing, squatting, sleeping, stairs, transfers, bed mobility, locomotion level, and caring for others  PARTICIPATION LIMITATIONS: meal prep, cleaning, laundry, driving, shopping, community activity, and yard work  [employer] POTENTIAL: Good  CLINICAL DECISION MAKING: Evolving/moderate complexity  EVALUATION COMPLEXITY: Moderate   PLAN:  PT FREQUENCY: 2x/week  PT DURATION: 3 weeks  PLANNED INTERVENTIONS: Therapeutic exercises, Therapeutic activity, Neuromuscular re-education, Balance training, Gait training, Patient/Family education, Joint manipulation, Joint mobilization, Stair training, Orthotic/Fit training, DME instructions, Aquatic Therapy, Dry Needling, Electrical stimulation, Spinal manipulation, Spinal mobilization, Cryotherapy, Moist heat, Compression bandaging, scar mobilization, Splintting, Taping, Traction, Ultrasound, Ionotophoresis '4mg'$ /ml Dexamethasone, and Manual therapy   PLAN FOR NEXT SESSION: retest right DHP; test left DHP; progress accordingly   2:53 PM, 08/12/22 Virlan Kempker Small Tashunda Vandezande MPT [facility] [place] [phone] [phone]

## 2022-08-15 ENCOUNTER — Telehealth: Payer: Self-pay | Admitting: Adult Health

## 2022-08-15 ENCOUNTER — Ambulatory Visit (HOSPITAL_COMMUNITY): Payer: Medicare PPO

## 2022-08-15 DIAGNOSIS — M6281 Muscle weakness (generalized): Secondary | ICD-10-CM

## 2022-08-15 DIAGNOSIS — R42 Dizziness and giddiness: Secondary | ICD-10-CM | POA: Diagnosis not present

## 2022-08-15 DIAGNOSIS — R2681 Unsteadiness on feet: Secondary | ICD-10-CM

## 2022-08-15 DIAGNOSIS — R29898 Other symptoms and signs involving the musculoskeletal system: Secondary | ICD-10-CM

## 2022-08-15 DIAGNOSIS — H8112 Benign paroxysmal vertigo, left ear: Secondary | ICD-10-CM | POA: Diagnosis not present

## 2022-08-15 DIAGNOSIS — I951 Orthostatic hypotension: Secondary | ICD-10-CM

## 2022-08-15 NOTE — Telephone Encounter (Signed)
Referral for physical therapy fax to [facility] at [place]. Phone: [phone], fax: [phone]

## 2022-08-15 NOTE — Telephone Encounter (Signed)
I called pt.  She is ok to proceed with the in lab sleep study.  Please order.  I relayed once authorized via insurance the sleep lab will call to set up.  She appreciated call back.

## 2022-08-15 NOTE — Telephone Encounter (Signed)
Called the patient to clarify what was needed. I see she currently saw PT today and she states that she has weakness in lower extremities. The therapist advised another referral for strengthening is needed. Referral will be placed.

## 2022-08-15 NOTE — Therapy (Signed)
OUTPATIENT PHYSICAL THERAPY VESTIBULAR TREATMENT     Patient Name: Heather Garrett MRN: [id number] DOB:[birth year], 72 y.o., female Today's Date: 08/15/2022  END OF SESSION:  PT End of Session - 08/15/22 0903     Visit Number 2    Number of Visits 6    Date for PT Re-Evaluation 09/02/22    Authorization Type [employer] Choice    Authorization Time Period auth submitted please check    PT Start Time 0901    PT Stop Time 0941    PT Time Calculation (min) 40 min    Activity Tolerance Patient tolerated treatment well    Behavior During Therapy WFL for tasks assessed/performed             Past Medical History:  Diagnosis Date   Allergy    SEASONAL   Anemia    "YEAR IN HALF AGO "   Anxiety    Arthritis    OSTEOARTHRITIS   Biceps tendinitis    Bladder cancer ([place]) 2014   Breast cancer ([place]) [place] 2 YEARS AGO UPDATED 05/03/22   Chronic diastolic CHF (congestive heart failure) ([place])    Cobalamin deficiency    Coronary arteriosclerosis in native artery    Diabetes mellitus without complication ([place])    "YEARS AGO WHEN i WAS OVER WEIGHTBUT NOT NOW" UPDATED 05/03/22   Elevated liver function tests    GERD (gastroesophageal reflux disease)    "A LITTLE"   Heart murmur    Hiatal hernia    Hyperlipidemia    Hypertension    Migraine    Myocardial infarction ([place])    Rotator cuff syndrome    Sleep apnea    "YES BUT RESOLVED",UPDATED 05/03/22   Transitional cell carcinoma, 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[place])    Tumor    "ON KIDNEY,REMOVED NO PROBLEMS SINCE",2014,RIGHT KIDNEY   Vitamin D deficiency    Past Surgical History:  Procedure Laterality Date   BREAST SURGERY     CARDIAC CATHETERIZATION     CATARACT EXTRACTION     CHOLECYSTECTOMY  1979   DG  BONE DENSITY ([place] [facility])     ENDOMETRIAL ABLATION     GASTRIC BYPASS     MASTECTOMY     OTHER SURGICAL HISTORY     CANCER SURGERY   TOTAL HIP ARTHROPLASTY Left 09/06/2021   Procedure: TOTAL HIP  ARTHROPLASTY;  Surgeon: Willaim Sheng, MD;  Location: [facility];  Service: Orthopedics;  Laterality: Left;   Patient Active Problem List   Diagnosis Date Noted   Sleep disturbance 07/06/2022   Hordeolum of left lower eyelid 06/27/2022   Positive screening for depression on 9-item Patient Health Questionnaire (PHQ-9) 06/27/2022   Nausea and vomiting 03/17/2022   Left ankle pain 02/20/2022   Acute respiratory failure with hypoxia ([place]) 12/10/2021   Severe sepsis ([place]) 12/10/2021   Multifocal pneumonia 12/10/2021   GERD (gastroesophageal reflux disease) 12/10/2021   Depression 12/10/2021   Chronic diastolic CHF (congestive heart failure) ([place]) 12/10/2021   Chronic anemia 12/10/2021   UTI (urinary tract infection) 11/28/2021   Prolonged QT interval 11/28/2021   Normocytic anemia 09/05/2021   Hyperkalemia 09/05/2021   Triggering of finger 05/17/2021   Heart disease 05/17/2021   Malignant tumor of breast ([place]) 05/17/2021   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[place]) 05/17/2021   Hypertensive disorder 05/17/2021   Lower extremity edema 05/14/2021   Benign paroxysmal positional vertigo of left ear 03/23/2021   Daytime somnolence 03/23/2021   History  of sleep apnea 03/23/2021   Orthostatic hypotension 03/23/2021   Migraine without aura and without status migrainosus, not intractable 03/23/2021   Coronary arteriosclerosis in native artery 09/28/2016   Elevated LFTs 09/28/2016   Rotator cuff syndrome 09/28/2016   Biceps tendinitis 09/28/2016   Cobalamin deficiency 09/28/2016   Hyperlipidemia 09/28/2016   Migraine 09/28/2016   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[place]) 09/28/2016   Vitamin D deficiency 09/28/2016    PCP: [name], FNP REFERRING PROVIDER: Melvenia Beam, [facility]  REFERRING DIAG: H81.12 (ICD-10-CM) - Benign paroxysmal positional vertigo of left ear  THERAPY DIAG:  Dizziness and giddiness  Unsteadiness on feet  Muscle weakness  (generalized)  ONSET DATE: a month; worsening over the last week  Rationale for Evaluation and Treatment: Rehabilitation  SUBJECTIVE:   SUBJECTIVE STATEMENT: Patient reports maybe a little better; still feels "foggy" in her brain; reports continued leg weakness and imbalance; she had a fall over the weekend but no injury; due to leg weakness not to vertigo.   Pt accompanied by: significant other  PERTINENT HISTORY: history of migraine; THA in March of last year; fall in June of last year with pelvis fx; WC bound  PAIN:  Are you having pain? Yes: NPRS scale: 7/10 Pain location: left side hip Pain description: sore; weak Aggravating factors: standing, movement Relieving factors: sit and rest  PRECAUTIONS: Fall  WEIGHT BEARING RESTRICTIONS: No  FALLS: Has patient fallen in last 6 months? Yes. Number of falls 1  LIVING ENVIRONMENT: Lives with: lives with their spouse Lives in: House/apartment Stairs:  unknown Has following equipment at home: Environmental consultant - 2 wheeled and Wheelchair (manual)  PLOF: Needs assistance with ADLs, Needs assistance with gait, and Needs assistance with transfers  PATIENT GOALS: not to be dizzy  OBJECTIVE:   DIAGNOSTIC FINDINGS:   CLINICAL DATA:  Left hip pain after fall in March of 2023. History of breast cancer   EXAM: CT OF THE LEFT HIP WITHOUT CONTRAST   TECHNIQUE: Multidetector CT imaging of the left hip was performed according to the standard protocol. Multiplanar CT image reconstructions were also generated.   RADIATION DOSE REDUCTION: This exam was performed according to the departmental dose-optimization program which includes automated exposure control, adjustment of the mA and/or kV according to patient size and/or use of iterative reconstruction technique.   COMPARISON:  X-ray 03/02/2022, CT 09/06/2021   FINDINGS: Bones/Joint/Cartilage   Postsurgical changes of left total hip arthroplasty. Arthroplasty components are in their  expected alignment without dislocation. No periprosthetic lucency. There is an acute to subacute appearing nondisplaced periprosthetic fracture involving the intertrochanteric and proximal subtrochanteric femur (series 6, images 47-54). No additional fractures. Healed right superior and inferior pubic rami fractures.   Ligaments   Suboptimally assessed by CT.   Muscles and Tendons   Generalized muscle atrophy, particularly affecting the gluteal musculature. No acute musculotendinous abnormality.   Soft tissues   No organized fluid collection or hematoma. No left inguinal lymphadenopathy. Extensive atherosclerotic vascular calcifications.   IMPRESSION: Acute to subacute appearing nondisplaced periprosthetic fracture involving the intertrochanteric and proximal subtrochanteric left femur.   These results will be called to the ordering clinician or representative by the Radiologist Assistant, and communication documented in the PACS or [employer].     Electronically Signed   By: Davina Poke D.O.   On: 03/19/2022 13:32   COGNITION: Overall cognitive status: Within functional limits for tasks assessed   SENSATION: Not tested  EDEMA:  Noted swelling left leg  POSTURE:  rounded shoulders, forward head, and flexed trunk  in WC  Cervical ROM:    Active AROM (deg) eval  Flexion wfl  Extension "Dizzy"  Right lateral flexion wfl  Left lateral flexion wfl  Right rotation wfl  Left rotation wfl  (Blank rows = not tested)  STRENGTH: not tested  LOWER EXTREMITY MMT: bilateral leg weakness left leg > right leg  MMT Right eval Left eval  Hip flexion    Hip abduction    Hip adduction    Hip internal rotation    Hip external rotation    Knee flexion    Knee extension    Ankle dorsiflexion    Ankle plantarflexion    Ankle inversion    Ankle eversion    (Blank rows = not tested)  BED MOBILITY:  Sit to supine Min A Supine to sit Min A Rolling to  Left Min A  TRANSFERS: Assistive device utilized: Wheelchair (manual)  Sit to stand: Min A Stand to sit: Min A Chair to chair: Min A GAIT: Gait pattern: decreased step length- Right and decreased step length- Left Distance walked: 2 ft Assistive device utilized:  PT hand held assist and None Level of assistance: Min A Comments: patient at risk for falls  FUNCTIONAL TESTS:  30 seconds chair stand test x 0  PATIENT SURVEYS:  DHI 66  (severe handicap)  VESTIBULAR ASSESSMENT:  GENERAL OBSERVATION: glasses   SYMPTOM BEHAVIOR:  Subjective history: see above  Non-Vestibular symptoms: migraine symptoms  Type of dizziness: Spinning/Vertigo  Frequency: varies  Duration: brief  Aggravating factors: Induced by position change: supine to sit and Induced by motion: turning body quickly and turning head quickly  Relieving factors: medication and closing eyes  Progression of symptoms: worse  OCULOMOTOR EXAM:  Ocular Alignment: normal  Ocular ROM: No Limitations  Spontaneous Nystagmus: absent  Gaze-Induced Nystagmus: absent  Smooth Pursuits: intact  Saccades:  not tested  Convergence/Divergence: not measured cm     VESTIBULAR - OCULAR REFLEX: not tested    POSITIONAL TESTING: Right Dix-Hallpike: upbeating, right nystagmus  MOTION SENSITIVITY:  Motion Sensitivity Quotient Intensity: 0 = none, 1 = Lightheaded, 2 = Mild, 3 = Moderate, 4 = Severe, 5 = Vomiting  Intensity-eval 08/15/22  1. Sitting to supine 1   2. Supine to L side 3   3. Supine to R side    4. Supine to sitting 2 2  5. L Hallpike-Dix  3  6. Up from L     7. R Hallpike-Dix 3 2  8. Up from R     9. Sitting, head tipped to L knee    10. Head up from L knee    11. Sitting, head tipped to R knee    12. Head up from R knee    13. Sitting head turns x5    14.Sitting head nods x5    15. In stance, 180 turn to L     16. In stance, 180 turn to R      OTHOSTATICS: not done  FUNCTIONAL GAIT: 30 seconds chair  stand test 0   VESTIBULAR TREATMENT:  DATE:  08/15/2022 Review of goals DHP to right and left Epley maneuver for left posterior canal x 2  08/12/2022 physical therapy evaluation  Canalith Repositioning:  Epley Right: Number of Reps: 1 and Response to Treatment: comment: dizziness with turn to the left; no dizziness on sitting but nauseated  PATIENT EDUCATION: Education details: Patient educated on exam findings, POC, scope of PT, HEP, and what to expect next visit. Person educated: Patient Education method: Explanation, Demonstration, and Handouts Education comprehension: verbalized understanding, returned demonstration, verbal cues required, and tactile cues required   HOME EXERCISE PROGRAM:  GOALS: Goals reviewed with patient? No  SHORT TERM GOALS: Target date: 08/26/2022  Patient will self report 50% improvement to improve tolerance for functional activity  Baseline: Goal status: IN PROGRESS     LONG TERM GOALS: Target date: 09/02/2022  Patient will improve DHI score ( decrease by 20 points) to demonstrate decreased dizziness with improved functional mobility  Baseline: 66 Goal status: IN PROGRESS  2.  Patient will be free of falls Baseline: had a fall this past weekend 08/15/22 Goal status: IN PROGRESS  3.  Patient will have a negative DHP to the right to improve ability to sleep and get up from sleeping without dizziness Baseline:  Goal status: IN PROGRESS  4.  Patient will self report 75% improvement to improve tolerance for functional activity  Baseline:  Goal status: IN PROGRESS    ASSESSMENT:  CLINICAL IMPRESSION: Today's session started with a review of set rehab goals; patient verbalizes agreement with set rehab goals.  Patient with mild positive DHP on right; less dizziness and shorter duration than at evaluation; more dizziness complaint with left DHP  testing today.  Epley maneuver for left posterior canal today x 2. Less compliant of dizziness with 2nd Epley maneuver. Patient needs min A for transfer from [facility] to mat table and with changing positions for testing and with Epley maneuver. Instructed her to keep her head upright until bedtime and she verbalizes understanding.   Patient will benefit from continued skilled therapy services to address deficits and promote return to optimal function.      Eval:Late arrival for evaluation today so testing time is limited.  Patient is WC bound and needs min A for transfer to the mat for evaluation.  Patient is a 72 y.o. female who was seen today for physical therapy evaluation and treatment for vertigo. Patient presents to physical therapy with complaint of dizziness with turning her head; leaning her head back. Patient demonstrates increased vestibular symptoms with provocative testing which is negatively impacting patient ability to perform ADLs and functional mobility tasks. Patient will benefit from skilled physical therapy services to address these deficits to improve level of function with ADLs, functional mobility tasks, and reduce risk for falls.    OBJECTIVE IMPAIRMENTS: Abnormal gait, decreased activity tolerance, decreased balance, decreased endurance, decreased mobility, difficulty walking, decreased ROM, decreased strength, dizziness, hypomobility, increased fascial restrictions, impaired perceived functional ability, increased muscle spasms, impaired flexibility, and pain.   ACTIVITY LIMITATIONS: carrying, lifting, bending, sitting, standing, squatting, sleeping, stairs, transfers, bed mobility, locomotion level, and caring for others  PARTICIPATION LIMITATIONS: meal prep, cleaning, laundry, driving, shopping, community activity, and yard work  [employer] POTENTIAL: Good  CLINICAL DECISION MAKING: Evolving/moderate complexity  EVALUATION COMPLEXITY: Moderate   PLAN:  PT FREQUENCY:  2x/week  PT DURATION: 3 weeks  PLANNED INTERVENTIONS: Therapeutic exercises, Therapeutic activity, Neuromuscular re-education, Balance training, Gait training, Patient/Family education, Joint manipulation, Joint mobilization, Stair training, Orthotic/Fit training, DME  instructions, Aquatic Therapy, Dry Needling, Electrical stimulation, Spinal manipulation, Spinal mobilization, Cryotherapy, Moist heat, Compression bandaging, scar mobilization, Splintting, Taping, Traction, Ultrasound, Ionotophoresis '4mg'$ /ml Dexamethasone, and Manual therapy   PLAN FOR NEXT SESSION: retest left DHP; progress accordingly   9:41 AM, 08/15/22 Loula Marcella Small Savvy Peeters MPT [facility] [place] [phone] TV:[phone]

## 2022-08-15 NOTE — Addendum Note (Signed)
Addended by: Star Age on: 08/15/2022 02:07 PM   Modules accepted: Orders

## 2022-08-15 NOTE — Telephone Encounter (Signed)
Pt called. Stated she would like Janett Billow to write her a referral for PT for her legs. Stated she would like it sent to [facility] in [place].

## 2022-08-15 NOTE — Telephone Encounter (Signed)
PSG ordered as an addendum to office visit note.

## 2022-08-16 ENCOUNTER — Other Ambulatory Visit: Payer: Self-pay

## 2022-08-16 ENCOUNTER — Encounter: Payer: Self-pay | Admitting: Emergency Medicine

## 2022-08-16 ENCOUNTER — Ambulatory Visit
Admission: EM | Admit: 2022-08-16 | Discharge: 2022-08-16 | Disposition: A | Discharge status: Home / Self Care | Payer: Medicare PPO | Attending: Family Medicine | Admitting: Family Medicine

## 2022-08-16 DIAGNOSIS — K1379 Other lesions of oral mucosa: Secondary | ICD-10-CM

## 2022-08-16 DIAGNOSIS — N390 Urinary tract infection, site not specified: Secondary | ICD-10-CM | POA: Insufficient documentation

## 2022-08-16 LAB — POCT URINALYSIS DIP (MANUAL ENTRY)
Glucose, UA: NEGATIVE mg/dL
Ketones, POC UA: NEGATIVE mg/dL
Nitrite, UA: NEGATIVE
Protein Ur, POC: 100 mg/dL — AB
Spec Grav, UA: 1.02 (ref 1.010–1.025)
Urobilinogen, UA: 0.2 E.U./dL
pH, UA: 6 (ref 5.0–8.0)

## 2022-08-16 MED ORDER — LIDOCAINE VISCOUS HCL 2 % MT SOLN: 5.0000 mL | OROMUCOSAL | 0 refills | Status: DC | PRN

## 2022-08-16 MED ORDER — CEPHALEXIN 500 MG PO CAPS: 500.0000 mg | ORAL_CAPSULE | Freq: Two times a day (BID) | ORAL | 0 refills | Status: DC

## 2022-08-16 NOTE — Discharge Instructions (Addendum)
I have sent over a course of antibiotics to help treat your urinary tract infection.  I have also sent out a urine culture to ensure that we have you on the right medication for the specific bacteria causing your infection.  Drink plenty of fluids and if you start feeling worse over the next 24 to 48 hours go directly to the emergency department.  I have sent out for some basic labs as well to ensure no significant abnormalities there particularly in the setting of your low blood pressure readings recently and the appearance of your urine being so abnormal.  Regarding your mouth pain, I have sent over some lidocaine gargle to help numb the pain and follow-up with your primary care provider on all of these issues

## 2022-08-16 NOTE — ED Provider Notes (Signed)
[facility]    CSN: [id number] Arrival date & time: 08/16/22  7564      History   Chief Complaint Chief Complaint  Patient presents with   Dysuria    HPI Heather Garrett is a 72 y.o. female.   Patient presenting today with 3 to 4-day history of progressively worsening dysuria, urinary frequency, suprapubic pain.  Denies fever, chills, body aches, nausea, vomiting, bowel changes.  She has a history of bladder cancer and states she is incontinent, wears depends daily.  Not trying anything over-the-counter for symptoms.  She also notes that her blood pressures have been borderline low for the past week or so, has been working with her PCP on this but that this is a new change for her.  She is also being treated by ENT for vertigo issues, states these have been ongoing for her prior to the symptoms.  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  No new medications recently, no sores or ingestion of too hot food.  Requesting a mouth rinse for this.    Past Medical History:  Diagnosis Date   Allergy    SEASONAL   Anemia    "YEAR IN HALF AGO "   Anxiety    Arthritis    OSTEOARTHRITIS   Biceps tendinitis    Bladder cancer ([place]) 2014   Breast cancer ([place]) 1990   Cataract    [place] 2 YEARS AGO UPDATED 05/03/22   Chronic diastolic CHF (congestive heart failure) (HCC)    Cobalamin deficiency    Coronary arteriosclerosis in native artery    Diabetes mellitus without complication ([place])    "YEARS AGO WHEN i WAS OVER WEIGHTBUT NOT NOW" UPDATED 05/03/22   Elevated liver function tests    GERD (gastroesophageal reflux disease)    "A LITTLE"   Heart murmur    Hiatal hernia    Hyperlipidemia    Hypertension    Migraine    Myocardial infarction ([place])    Rotator cuff syndrome    Sleep apnea    "YES BUT RESOLVED",UPDATED 05/03/22    Transitional cell carcinoma, 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[place])    Tumor    "ON KIDNEY,REMOVED NO PROBLEMS SINCE",2014,RIGHT KIDNEY   Vitamin D deficiency     Patient Active Problem List   Diagnosis Date Noted   Sleep disturbance 07/06/2022   Hordeolum of left lower eyelid 06/27/2022   Positive screening for depression on 9-item Patient Health Questionnaire (PHQ-9) 06/27/2022   Nausea and vomiting 03/17/2022   Left ankle pain 02/20/2022   Acute respiratory failure with hypoxia ([place]) 12/10/2021   Severe sepsis ([place]) 12/10/2021   Multifocal pneumonia 12/10/2021   GERD (gastroesophageal reflux disease) 12/10/2021   Depression 12/10/2021   Chronic diastolic CHF (congestive heart failure) ([place]) 12/10/2021   Chronic anemia 12/10/2021   UTI (urinary tract infection) 11/28/2021   Prolonged QT interval 11/28/2021   Normocytic anemia 09/05/2021   Hyperkalemia 09/05/2021   Triggering of finger 05/17/2021   Heart disease 05/17/2021   Malignant tumor of breast ([place]) 05/17/2021   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[place]) 05/17/2021   Hypertensive disorder 05/17/2021   Lower extremity edema 05/14/2021   Benign paroxysmal positional vertigo of left ear 03/23/2021   Daytime somnolence 03/23/2021   History of sleep apnea 03/23/2021   Orthostatic hypotension 03/23/2021   Migraine without aura and without status  migrainosus, not intractable 03/23/2021   Coronary arteriosclerosis in native artery 09/28/2016   Elevated LFTs 09/28/2016   Rotator cuff syndrome 09/28/2016   Biceps tendinitis 09/28/2016   Cobalamin deficiency 09/28/2016   Hyperlipidemia 09/28/2016   Migraine 09/28/2016   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   Vitamin D deficiency 09/28/2016    Past Surgical History:  Procedure Laterality Date   BREAST SURGERY     CARDIAC CATHETERIZATION     CATARACT EXTRACTION     CHOLECYSTECTOMY  1979   DG  BONE DENSITY ([place] [facility])     ENDOMETRIAL ABLATION     GASTRIC BYPASS     MASTECTOMY      OTHER SURGICAL HISTORY     CANCER SURGERY   TOTAL HIP ARTHROPLASTY Left 09/06/2021   Procedure: TOTAL HIP ARTHROPLASTY;  Surgeon: Willaim Sheng, MD;  Location: [facility];  Service: Orthopedics;  Laterality: Left;    OB History     Gravida  2   Para  2   Term  2   Preterm  0   AB  0   Living  2      SAB  0   IAB  0   Ectopic  0   Multiple  0   Live Births  2            Home Medications    Prior to Admission medications   Medication Sig Start Date End Date Taking? Authorizing Provider  cephALEXin (KEFLEX) 500 MG capsule Take 1 capsule (500 mg total) by mouth 2 (two) times daily. 08/16/22  Yes Volney American, PA-C  hydrochlorothiazide (HYDRODIURIL) 12.5 MG tablet Take 1 tablet (12.5 mg total) by mouth as needed (take 1 tablet if your BP is >140/90). 07/07/22   [name], FNP  lidocaine (XYLOCAINE) 2 % solution Use as directed 5 mLs in the mouth or throat every 3 (three) hours as needed for mouth pain. 08/16/22  Yes Volney American, PA-C  alendronate (FOSAMAX) 70 MG tablet 70 mg once a week. 04/19/22   [provider]  amLODipine (NORVASC) 5 MG tablet Take 1 tablet (5 mg total) by mouth daily. 06/24/22   Marylu Lund., NP  aspirin 81 MG EC tablet Take 1 tablet (81 mg total) by mouth every other day. Swallow whole.  Hold aspirin while taking eliquis, Patient taking differently: Take 81 mg by mouth daily. Swallow whole.  Hold aspirin while taking eliquis, 09/10/21   Florencia Reasons, MD  Atogepant (QULIPTA) 60 MG TABS Take 1 tablet (60 mg total) by mouth daily. 08/11/22   Frann Rider, NP  carvedilol (COREG) 3.125 MG tablet Take 1 tablet (3.125 mg total) by mouth 2 (two) times daily. Hold if sbp less than 100 or heart rate less than 50 06/09/22   [name], MD  celecoxib (CELEBREX) 100 MG capsule Take 1 capsule by mouth twice a day with meals 06/09/22   [name], FNP  ezetimibe (ZETIA) 10 MG tablet Take 1 tablet (10 mg  total) by mouth daily. 05/20/22   [name], FNP  famotidine (PEPCID) 40 MG tablet Take 1 tablet (40 mg total) by mouth as needed. 03/01/22   [name], FNP  ferrous sulfate 325 (65 FE) MG EC tablet Take 1 tablet (325 mg total) by mouth every Monday, Wednesday, and Friday. 09/10/21   Florencia Reasons, MD  folic acid (FOLVITE) 1 MG tablet Take 1 tablet (1 mg total) by mouth daily. 12/03/21 12/03/22  Florencia Reasons, MD  hydrochlorothiazide (HYDRODIURIL) 25 MG tablet Take 1 tablet (25 mg total) by mouth daily as needed (swelling/fluid). 06/07/22   [name], FNP  nystatin-triamcinolone ointment [facility]) 1 Topical Daily 04/19/22   [provider]  pantoprazole (PROTONIX) 40 MG tablet Take 1 tablet (40 mg total) by mouth 2 (two) times daily before a meal. Patient taking differently: Take 40 mg by mouth daily. 06/07/22   [name], FNP  Polyethylene Glycol 400 (BLINK TEARS) 0.25 % SOLN Place 1 drop into both eyes daily as needed (dry eyes).    [provider]  sertraline (ZOLOFT) 100 MG tablet Take 1 tablet (100 mg total) by mouth daily. 03/11/22   [name], FNP  simvastatin (ZOCOR) 20 MG tablet Take 1 tablet (20 mg total) by mouth at bedtime. 05/20/22   [name], FNP  tiZANidine (ZANAFLEX) 4 MG tablet Take 1 tablet (4 mg total) by mouth at bedtime. 03/21/22   [name], FNP  trimethobenzamide (TIGAN) 300 MG capsule Take 1 capsule (300 mg total) by mouth 3 (three) times daily. 03/17/22   Paseda, Dewaine Conger, FNP  Ubrogepant (UBRELVY) 100 MG TABS Take 100 mg by mouth daily as needed (migraine headaches). Can repeat x1 after 2 hours if needed. Max dose '200mg'$ /24 hours 05/02/22   Frann Rider, NP    Family History Family History  Problem Relation Age of Onset   Congestive Heart Failure Mother    Coronary artery disease Father    Heart attack Father 2       cause of death.   Diabetes Mellitus II Sister    Hypertension Sister    Breast cancer Sister     Migraines Neg Hx    Colon cancer Neg Hx    Esophageal cancer Neg Hx    Stomach cancer Neg Hx    Colon polyps Neg Hx    Crohn's disease Neg Hx    Rectal cancer Neg Hx    Ulcerative colitis Neg Hx     Social History Social History   Tobacco Use   Smoking status: Former    Passive exposure: Never   Smokeless tobacco: Never   Tobacco comments:    Quit 30 yrs ago  Vaping Use   Vaping Use: Never used  Substance Use Topics   Alcohol use: Yes    Alcohol/week: 14.0 standard drinks of alcohol    Types: 14 Glasses of wine per week    Comment: daily, 2glasses wine daily   Drug use: Never     Allergies   Contrast media [iodinated contrast media], Ciprofloxacin, Erythromycin base, and Sulfabenzamide   Review of Systems Review of Systems PER HPI  Physical Exam Triage Vital Signs ED Triage Vitals  Enc Vitals Group     BP 08/16/22 0855 91/61     Pulse Rate 08/16/22 0855 91     Resp 08/16/22 0855 20     Temp 08/16/22 0855 97.9 F (36.6 C)     Temp Source 08/16/22 0855 Oral     SpO2 08/16/22 0855 98 %     Weight --      Height --      Head Circumference --      Peak Flow --      Pain Score 08/16/22 0859 0     Pain Loc --      Pain Edu? --      Excl. in [place]? --    No data found.  Updated Vital Signs BP 91/61 (BP  Location: Right Arm)   Pulse 91   Temp 97.9 F (36.6 C) (Oral)   Resp 20   SpO2 98%   Visual Acuity Right Eye Distance:   Left Eye Distance:   Bilateral Distance:    Right Eye Near:   Left Eye Near:    Bilateral Near:     Physical Exam Vitals and nursing note reviewed.  Constitutional:      Appearance: Normal appearance. She is not ill-appearing.  HENT:     Head: Atraumatic.     Mouth/Throat:     Mouth: Mucous membranes are moist.     Comments: Tongue mildly erythematous in areas, no sores or coating noted Eyes:     Extraocular Movements: Extraocular movements intact.     Conjunctiva/sclera: Conjunctivae normal.  Cardiovascular:     Rate  and Rhythm: Normal rate and regular rhythm.     Heart sounds: Normal heart sounds.  Pulmonary:     Effort: Pulmonary effort is normal.     Breath sounds: Normal breath sounds.  Abdominal:     General: Bowel sounds are normal. There is no distension.     Palpations: Abdomen is soft.     Tenderness: There is no abdominal tenderness. There is no guarding.  Musculoskeletal:     Cervical back: Normal range of motion and neck supple.     Comments: Currently in wheelchair due to vertigo per patient  Skin:    General: Skin is warm and dry.  Neurological:     Mental Status: She is alert and oriented to person, place, and time.  Psychiatric:        Mood and Affect: Mood normal.        Thought Content: Thought content normal.        Judgment: Judgment normal.      UC Treatments / Results  Labs (all labs ordered are listed, but only abnormal results are displayed) Labs Reviewed  POCT URINALYSIS DIP (MANUAL ENTRY) - Abnormal; Notable for the following components:      Result Value   Color, UA other (*)    Clarity, UA cloudy (*)    Bilirubin, UA small (*)    Blood, UA large (*)    Protein Ur, POC =100 (*)    Leukocytes, UA Large (3+) (*)    All other components within normal limits  URINE CULTURE  COMPREHENSIVE METABOLIC PANEL  CBC WITH DIFFERENTIAL/PLATELET    EKG   Radiology No results found.  Procedures Procedures (including critical care time)  Medications Ordered in UC Medications - No data to display  Initial Impression / Assessment and Plan / UC Course  I have reviewed the triage vital signs and the nursing notes.  Pertinent labs & imaging results that were available during my care of the patient were reviewed by me and considered in my medical decision making (see chart for details).     Viscous lidocaine sent for oral pain, follow-up with dentist and PCP for recheck.  No evidence of thrush at this time or ulcerations.  Urinalysis showing evidence of a  significant urinary tract infection, urine culture pending, treat with Keflex and fluids in the meantime.  ED for worsening symptoms.  Labs are pending due to 1 week of ongoing borderline hypotension as well as the severity of the abnormalities in her urinalysis and milky cloudy appearance of urine specimen.  Final Clinical Impressions(s) / UC Diagnoses   Final diagnoses:  Acute lower UTI  Oral pain  Discharge Instructions      I have sent over a course of antibiotics to help treat your urinary tract infection.  I have also sent out a urine culture to ensure that we have you on the right medication for the specific bacteria causing your infection.  Drink plenty of fluids and if you start feeling worse over the next 24 to 48 hours go directly to the emergency department.  I have sent out for some basic labs as well to ensure no significant abnormalities there particularly in the setting of your low blood pressure readings recently and the appearance of your urine being so abnormal.  Regarding your mouth pain, I have sent over some lidocaine gargle to help numb the pain and follow-up with your primary care provider on all of these issues    ED Prescriptions     Medication Sig Dispense Auth. Provider   cephALEXin (KEFLEX) 500 MG capsule Take 1 capsule (500 mg total) by mouth 2 (two) times daily. 14 capsule Volney American, PA-C   lidocaine (XYLOCAINE) 2 % solution Use as directed 5 mLs in the mouth or throat every 3 (three) hours as needed for mouth pain. 100 mL Volney American, [place]      PDMP not reviewed this encounter.   Volney American, [place] 08/16/22 1423

## 2022-08-16 NOTE — ED Triage Notes (Signed)
Pt reports dysuria and urinary frequency for last several days. Denies fever, abd pain.

## 2022-08-17 ENCOUNTER — Emergency Department (HOSPITAL_COMMUNITY): Payer: Medicare PPO

## 2022-08-17 ENCOUNTER — Encounter (HOSPITAL_COMMUNITY): Payer: Self-pay

## 2022-08-17 ENCOUNTER — Other Ambulatory Visit: Payer: Self-pay

## 2022-08-17 ENCOUNTER — Emergency Department (HOSPITAL_COMMUNITY)
Admission: EM | Admit: 2022-08-17 | Discharge: 2022-08-17 | Disposition: A | Discharge status: Home / Self Care | Payer: Medicare PPO | Attending: Emergency Medicine | Admitting: Emergency Medicine

## 2022-08-17 ENCOUNTER — Telehealth (HOSPITAL_COMMUNITY): Payer: Self-pay | Admitting: Emergency Medicine

## 2022-08-17 DIAGNOSIS — K449 Diaphragmatic hernia without obstruction or gangrene: Secondary | ICD-10-CM | POA: Diagnosis not present

## 2022-08-17 DIAGNOSIS — R42 Dizziness and giddiness: Secondary | ICD-10-CM

## 2022-08-17 DIAGNOSIS — I959 Hypotension, unspecified: Secondary | ICD-10-CM | POA: Diagnosis not present

## 2022-08-17 DIAGNOSIS — I952 Hypotension due to drugs: Secondary | ICD-10-CM | POA: Diagnosis not present

## 2022-08-17 LAB — CBC WITH DIFFERENTIAL/PLATELET
Basophils Absolute: 0.1 10*3/uL (ref 0.0–0.2)
Basos: 1 %
EOS (ABSOLUTE): 0.1 10*3/uL (ref 0.0–0.4)
Eos: 1 %
Hematocrit: 26.1 % — ABNORMAL LOW (ref 34.0–46.6)
Hemoglobin: 8.8 g/dL — ABNORMAL LOW (ref 11.1–15.9)
Immature Grans (Abs): 0.1 10*3/uL (ref 0.0–0.1)
Immature Granulocytes: 1 %
Lymphocytes Absolute: 2 10*3/uL (ref 0.7–3.1)
Lymphs: 11 %
MCH: 35.6 pg — ABNORMAL HIGH (ref 26.6–33.0)
MCHC: 33.7 g/dL (ref 31.5–35.7)
MCV: 106 fL — ABNORMAL HIGH (ref 79–97)
Monocytes Absolute: 1.4 10*3/uL — ABNORMAL HIGH (ref 0.1–0.9)
Monocytes: 8 %
Neutrophils Absolute: 14 10*3/uL — ABNORMAL HIGH (ref 1.4–7.0)
Neutrophils: 78 %
Platelets: 231 10*3/uL (ref 150–450)
RBC: 2.47 x10E6/uL — CL (ref 3.77–5.28)
RDW: 16.5 % — ABNORMAL HIGH (ref 11.7–15.4)
WBC: 17.7 10*3/uL — ABNORMAL HIGH (ref 3.4–10.8)

## 2022-08-17 LAB — COMPREHENSIVE METABOLIC PANEL
ALT: 25 IU/L (ref 0–32)
AST: 31 IU/L (ref 0–40)
Albumin/Globulin Ratio: 0.7 — ABNORMAL LOW (ref 1.2–2.2)
Albumin: 2.5 g/dL — ABNORMAL LOW (ref 3.8–4.8)
Alkaline Phosphatase: 238 IU/L — ABNORMAL HIGH (ref 44–121)
BUN/Creatinine Ratio: 9 — ABNORMAL LOW (ref 12–28)
BUN: 9 mg/dL (ref 8–27)
Bilirubin Total: 0.7 mg/dL (ref 0.0–1.2)
CO2: 18 mmol/L — ABNORMAL LOW (ref 20–29)
Calcium: 8.2 mg/dL — ABNORMAL LOW (ref 8.7–10.3)
Chloride: 98 mmol/L (ref 96–106)
Creatinine, Ser: 1.03 mg/dL — ABNORMAL HIGH (ref 0.57–1.00)
Globulin, Total: 3.4 g/dL (ref 1.5–4.5)
Glucose: 100 mg/dL — ABNORMAL HIGH (ref 70–99)
Potassium: 5.1 mmol/L (ref 3.5–5.2)
Sodium: 132 mmol/L — ABNORMAL LOW (ref 134–144)
Total Protein: 5.9 g/dL — ABNORMAL LOW (ref 6.0–8.5)
eGFR: 58 mL/min/{1.73_m2} — ABNORMAL LOW (ref >59)

## 2022-08-17 LAB — BASIC METABOLIC PANEL
Anion gap: 9 (ref 5–15)
BUN: 13 mg/dL (ref 8–23)
CO2: 22 mmol/L (ref 22–32)
Calcium: 7.6 mg/dL — ABNORMAL LOW (ref 8.9–10.3)
Chloride: 100 mmol/L (ref 98–111)
Creatinine, Ser: 1.17 mg/dL — ABNORMAL HIGH (ref 0.44–1.00)
GFR, Estimated: 50 mL/min — ABNORMAL LOW (ref >60)
Glucose, Bld: 145 mg/dL — ABNORMAL HIGH (ref 70–99)
Potassium: 4.7 mmol/L (ref 3.5–5.1)
Sodium: 131 mmol/L — ABNORMAL LOW (ref 135–145)

## 2022-08-17 LAB — CBC
HCT: 23.7 % — ABNORMAL LOW (ref 36.0–46.0)
Hemoglobin: 7.5 g/dL — ABNORMAL LOW (ref 12.0–15.0)
MCH: 35.7 pg — ABNORMAL HIGH (ref 26.0–34.0)
MCHC: 31.6 g/dL (ref 30.0–36.0)
MCV: 112.9 fL — ABNORMAL HIGH (ref 80.0–100.0)
Platelets: 155 10*3/uL (ref 150–400)
RBC: 2.1 MIL/uL — ABNORMAL LOW (ref 3.87–5.11)
RDW: 18.8 % — ABNORMAL HIGH (ref 11.5–15.5)
WBC: 14.6 10*3/uL — ABNORMAL HIGH (ref 4.0–10.5)
nRBC: 0 % (ref 0.0–0.2)

## 2022-08-17 MED ORDER — MECLIZINE HCL 12.5 MG PO TABS: 12.5000 mg | ORAL_TABLET | Freq: Three times a day (TID) | ORAL | 0 refills | Status: DC | PRN

## 2022-08-17 MED ORDER — SODIUM CHLORIDE 0.9 % IV BOLUS
1000.0000 mL | Freq: Once | INTRAVENOUS | Status: AC
  Administered 2022-08-17: 1000 mL via INTRAVENOUS

## 2022-08-17 MED ORDER — MECLIZINE HCL 12.5 MG PO TABS
25.0000 mg | ORAL_TABLET | Freq: Once | ORAL | Status: AC
  Administered 2022-08-17: 25 mg via ORAL
  Filled 2022-08-17: qty 2

## 2022-08-17 NOTE — ED Triage Notes (Signed)
Pt reports hx of vertigo and feels dizzy when she stands up.

## 2022-08-17 NOTE — Telephone Encounter (Signed)
Per Dr. Lanny Cramp, due to BP patient to be encouraged again to call ambulance for transport. Reviewed with patient, she is refusing at this time.  Reviewed with Dr. Lanny Cramp, he would like EMS called to her home regardless.  EMS called and updated

## 2022-08-17 NOTE — Telephone Encounter (Signed)
Patient is being sent post-discharge from Urgent Care to the Emergency Department via private vehicle (offered ambulance, patient refused) . Per Dr. Susa Simmonds, patient is in need of higher level of care due to meeting sepsis criteria (elevated HR, BP is 76/50 this morning, and elevated WBC). Patient is aware and verbalizes understanding of plan of care.

## 2022-08-17 NOTE — ED Provider Notes (Signed)
[facility] Provider Note   CSN: [id number] Arrival date & time: 08/17/22  1218     History Chief Complaint  Patient presents with   Dizziness    Heather Garrett is a 71 y.o. female.  Patient presents emergency department complaints of several days of dizziness.  Patient reports that she feels that the room is spinning but does not feel like she will pass out or fall over.  She does report that she has noticed that her blood pressures typically higher than what is currently reading while here in the emergency department.  Feels increased dizziness when she goes from lying to standing.  Patient told me that she was previously on lisinopril but per her medication list she is on hydrochlorothiazide and amlodipine.  She is not sure when the change happened to these 2 medications and lieu of lisinopril.  She does report that she was that she was on lisinopril for a long period of time.  Denies any chest pain, shortness of breath, fevers.  Was recently diagnosed with a UTI and currently on Keflex and feels that UTI symptoms are improving slightly.  She reports that she typically feels off when she takes antibiotics and please that this may be why she is feeling dizzy at this time.  Reports that she has previously been diagnosed with vertigo and has had treatment via positional maneuvers, denies any treatment using meclizine.   Dizziness Associated symptoms: no chest pain, no headaches and no weakness        Home Medications Prior to Admission medications   Medication Sig Start Date End Date Taking? Authorizing Provider  alendronate (FOSAMAX) 70 MG tablet 70 mg once a week. 04/19/22  Yes [provider]  amLODipine (NORVASC) 5 MG tablet Take 1 tablet (5 mg total) by mouth daily. 06/24/22  Yes Marylu Lund., NP  aspirin 81 MG EC tablet Take 1 tablet (81 mg total) by mouth every other day. Swallow whole.  Hold aspirin while taking  eliquis, Patient taking differently: Take 81 mg by mouth daily. Swallow whole.  Hold aspirin while taking eliquis, 09/10/21  Yes Florencia Reasons, MD  Atogepant (QULIPTA) 60 MG TABS Take 1 tablet (60 mg total) by mouth daily. 08/11/22  Yes McCue, Janett Billow, NP  atorvastatin (LIPITOR) 40 MG tablet Take 40 mg by mouth daily. 05/20/22  Yes [provider]  carvedilol (COREG) 3.125 MG tablet Take 1 tablet (3.125 mg total) by mouth 2 (two) times daily. Hold if sbp less than 100 or heart rate less than 50 06/09/22  Yes Pemberton, Greer Ee, MD  celecoxib (CELEBREX) 100 MG capsule Take 1 capsule by mouth twice a day with meals Patient taking differently: Take 100 mg by mouth 2 (two) times daily. 06/09/22  Yes [name], FNP  cephALEXin (KEFLEX) 500 MG capsule Take 1 capsule (500 mg total) by mouth 2 (two) times daily. Patient taking differently: Take 500 mg by mouth 2 (two) times daily. 7 days supply Start on 08/16/22 08/16/22  Yes Volney American, PA-C  ezetimibe (ZETIA) 10 MG tablet Take 1 tablet (10 mg total) by mouth daily. 05/20/22  Yes [name], FNP  famotidine (PEPCID) 40 MG tablet Take 1 tablet (40 mg total) by mouth as needed. 03/01/22  Yes [name], FNP  ferrous sulfate 325 (65 FE) MG EC tablet Take 1 tablet (325 mg total) by mouth every Monday, Wednesday, and Friday. 09/10/21  Yes Florencia Reasons, MD  folic  acid (FOLVITE) 1 MG tablet Take 1 tablet (1 mg total) by mouth daily. 12/03/21 12/03/22 Yes Florencia Reasons, MD  hydrochlorothiazide (HYDRODIURIL) 12.5 MG tablet Take 1 tablet (12.5 mg total) by mouth as needed (take 1 tablet if your BP is >140/90). 07/07/22  Yes [name], FNP  lidocaine (XYLOCAINE) 2 % solution Use as directed 5 mLs in the mouth or throat every 3 (three) hours as needed for mouth pain. 08/16/22  Yes Volney American, PA-C  meclizine (ANTIVERT) 12.5 MG tablet Take 1 tablet (12.5 mg total) by mouth 3 (three) times daily as needed for dizziness. 08/17/22  Yes Luvenia Heller, PA-C  pantoprazole (PROTONIX) 40 MG tablet Take 1 tablet (40 mg total) by mouth 2 (two) times daily before a meal. Patient taking differently: Take 40 mg by mouth daily. 06/07/22  Yes [name], FNP  Polyethylene Glycol 400 (BLINK TEARS) 0.25 % SOLN Place 1 drop into both eyes daily as needed (dry eyes).   Yes [provider]  sertraline (ZOLOFT) 100 MG tablet Take 1 tablet (100 mg total) by mouth daily. 03/11/22  Yes [name], FNP  simvastatin (ZOCOR) 20 MG tablet Take 1 tablet (20 mg total) by mouth at bedtime. 05/20/22  Yes [name], FNP  tiZANidine (ZANAFLEX) 4 MG tablet Take 1 tablet (4 mg total) by mouth at bedtime. 03/21/22  Yes [name], FNP  trimethobenzamide (TIGAN) 300 MG capsule Take 1 capsule (300 mg total) by mouth 3 (three) times daily. 03/17/22  Yes Paseda, Dewaine Conger, FNP  hydrochlorothiazide (HYDRODIURIL) 25 MG tablet Take 1 tablet (25 mg total) by mouth daily as needed (swelling/fluid). Patient not taking: Reported on 08/17/2022 06/07/22   [name], FNP  nystatin-triamcinolone ointment [facility]) 1 Topical Daily Patient not taking: Reported on 08/17/2022 04/19/22   [provider]  Ubrogepant (UBRELVY) 100 MG TABS Take 100 mg by mouth daily as needed (migraine headaches). Can repeat x1 after 2 hours if needed. Max dose '200mg'$ /24 hours 05/02/22   Frann Rider, NP      Allergies    Contrast media [iodinated contrast media], Ciprofloxacin, Erythromycin base, and Sulfabenzamide    Review of Systems   Review of Systems  Constitutional:  Negative for chills, fatigue and fever.  Respiratory:  Negative for choking and chest tightness.   Cardiovascular:  Negative for chest pain.  Genitourinary:  Negative for dysuria.  Neurological:  Positive for dizziness. Negative for weakness, light-headedness and headaches.  All other systems reviewed and are negative.   Physical Exam Updated Vital Signs BP 102/63   Pulse (!) 51   Temp  97.7 F (36.5 C) (Oral)   Resp 14   Ht '4\' 8"'$  (1.422 m)   Wt 50.8 kg   SpO2 100%   BMI 25.11 kg/m  Physical Exam Vitals and nursing note reviewed.  Constitutional:      Appearance: Normal appearance.  HENT:     Head: Normocephalic and atraumatic.  Eyes:     Conjunctiva/sclera: Conjunctivae normal.     Pupils: Pupils are equal, round, and reactive to light.  Cardiovascular:     Rate and Rhythm: Normal rate and regular rhythm.  Pulmonary:     Effort: Pulmonary effort is normal.     Breath sounds: Normal breath sounds.  Abdominal:     General: Abdomen is flat.  Musculoskeletal:        General: Normal range of motion.     Cervical back: Normal range of motion.  Lymphadenopathy:  Cervical: No cervical adenopathy.  Skin:    General: Skin is warm and dry.     Capillary Refill: Capillary refill takes less than 2 seconds.  Neurological:     General: No focal deficit present.     Mental Status: She is alert and oriented to person, place, and time. Mental status is at baseline.  Psychiatric:        Mood and Affect: Mood normal.     ED Results / Procedures / Treatments   Labs (all labs ordered are listed, but only abnormal results are displayed) Labs Reviewed  BASIC METABOLIC PANEL - Abnormal; Notable for the following components:      Result Value   Sodium 131 (*)    Glucose, Bld 145 (*)    Creatinine, Ser 1.17 (*)    Calcium 7.6 (*)    GFR, Estimated 50 (*)    All other components within normal limits  CBC - Abnormal; Notable for the following components:   WBC 14.6 (*)    RBC 2.10 (*)    Hemoglobin 7.5 (*)    HCT 23.7 (*)    MCV 112.9 (*)    MCH 35.7 (*)    RDW 18.8 (*)    All other components within normal limits  URINALYSIS, ROUTINE W REFLEX MICROSCOPIC  CBG MONITORING, ED    EKG None  Radiology DG Chest Port 1 View  Result Date: 08/17/2022 CLINICAL DATA:  Dizziness with vertigo. EXAM: PORTABLE CHEST 1 VIEW COMPARISON:  Radiographs 12/09/2021 and  12/01/2021.  CT 01/02/2021. FINDINGS: 1409 hours. The heart size and mediastinal contours are stable with aortic atherosclerosis and a small hiatal hernia. The bilateral airspace opacities seen on the most recent study have resolved. The lungs are now clear. There is no pleural effusion or pneumothorax. No acute osseous findings are evident. There are degenerative changes in the spine associated with a mild thoracolumbar scoliosis. Telemetry leads overlie the chest. IMPRESSION: No evidence of acute cardiopulmonary process. Interval resolution of bilateral airspace opacities. Electronically Signed   By: Richardean Sale M.D.   On: 08/17/2022 14:18    Procedures Procedures   Medications Ordered in ED Medications  sodium chloride 0.9 % bolus 1,000 mL (0 mLs Intravenous Stopped 08/17/22 1454)  meclizine (ANTIVERT) tablet 25 mg (25 mg Oral Given 08/17/22 1516)    ED Course/ Medical Decision Making/ A&P                           Medical Decision Making Amount and/or Complexity of Data Reviewed Labs: ordered.   This patient presents to the ED for concern of dizziness. Differential diagnosis includes hypotension, medication side effect, BPPV, viral URI   Lab Tests:  I Ordered, and personally interpreted labs.  The pertinent results include: CBC, BMP appear to be patient baseline   Imaging Studies ordered:  I ordered imaging studies including chest x-ray I independently visualized and interpreted imaging which showed no obvious acute cardiopulmonary disease I agree with the radiologist interpretation   Medicines ordered and prescription drug management:  I ordered medication including fluids, meclizine for dehydration and dizziness Reevaluation of the patient after these medicines showed that the patient improved I have reviewed the patients home medicines and have made adjustments as needed   Problem List / ED Course:  Patient presented to the ED for concerns of dizziness. She described  dizziness as primarily room spinning, but did state that sensation would worsen when trying to stand.  Patient denies any recent fevers or any concern possible illness.  Patient has a prior history of an MRI in 2002, but no evidence of congestive heart failure.  Patient does report that she was started on amlodipine about 1 month ago by primary care provider and previously been on hydrochlorothiazide and lisinopril.  Patient was on hydrochlorothiazide and amlodipine prior to arrival to the emergency department.  Given that blood pressures remain somewhat low will patient continues to department, concerned that medication dosing is too high for her and causing hypotension which is likely causing her dizziness.  Patient was given IV fluids and dose of meclizine which she reported did help significantly with her symptoms. Advised patient to discontinue amlodipine until she can follow-up by her primary care provider for further evaluation and possible medication adjustment.  Patient was agreeable to treatment plan verbalized understand all return precautions.  All questions answered prior to discharge.   Final Clinical Impression(s) / ED Diagnoses Final diagnoses:  Vertigo  Hypotension due to drugs    Rx / DC Orders ED Discharge Orders          Ordered    meclizine (ANTIVERT) 12.5 MG tablet  3 times daily PRN        08/17/22 1617              Luvenia Heller, PA-C 08/17/22 1617    Milton Ferguson, MD 08/17/22 1621

## 2022-08-17 NOTE — Discharge Instructions (Addendum)
You were seen in the emergency department for dizziness. Given that all of your labs and imaging were reassuring, I believe that your dizziness was likely a result of low blood pressure. I would plan on stopping Amlodipine until you can follow up with your primary care provider for reevaluation and possible medication adjustment. If you have episodes were you do pass out or fall down from feeling dizzy, please return to the emergency department for further evaluation.

## 2022-08-19 ENCOUNTER — Ambulatory Visit: Payer: Medicare PPO | Admitting: Family Medicine

## 2022-08-19 LAB — URINE CULTURE: Culture: >=100000 — AB

## 2022-08-22 ENCOUNTER — Ambulatory Visit: Payer: Medicare PPO | Admitting: Internal Medicine

## 2022-08-22 ENCOUNTER — Encounter: Payer: Self-pay | Admitting: Internal Medicine

## 2022-08-22 VITALS — BP 114/78 | HR 72 | Ht <= 58 in | Wt 112.0 lb

## 2022-08-22 DIAGNOSIS — I10 Essential (primary) hypertension: Secondary | ICD-10-CM

## 2022-08-22 DIAGNOSIS — R600 Localized edema: Secondary | ICD-10-CM

## 2022-08-22 DIAGNOSIS — K146 Glossodynia: Secondary | ICD-10-CM | POA: Diagnosis not present

## 2022-08-22 MED ORDER — GABAPENTIN 100 MG PO CAPS: 100.0000 mg | ORAL_CAPSULE | Freq: Three times a day (TID) | ORAL | 0 refills | Status: DC

## 2022-08-22 NOTE — Assessment & Plan Note (Signed)
Patient continues to have lower extremity edema with 2+ pitting edema to her ankles.  She is currently on amlodipine 5 mg.  She does believe this could have started with amlodipine.    Assessment/Plan: Chronic illness with exacerbation -Review of TTE 05/30/2022 shows normal EF and normal right side systolic function.  -Recommend wearing compression stockings and elevating legs above patient's heart while resting.  Stop hydrochlorothiazide. She has had hypotension with this treatment. - Stop amlodipine 5 mg, as it may contribute although usually seen at 10 mg dose -Patient has follow-up in 2 weeks and will evaluated at this visit.

## 2022-08-22 NOTE — Assessment & Plan Note (Signed)
She has history listed of vertigo and hypertension. Patient presents for follow up after presenting to ED with dizziness. In the ED she was hypotensive.  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 She was given IV fluids and meclizine.  She had improvement in her symptoms and was discharged from ED with follow-up with PCP recommendations.  Patient has not taken her blood pressure medication this morning and her blood pressure is 114/78.  She has not had any dizziness at home.  On review of labs the ED she had a new anemia trending 11.6>8.8>7.5.  Reports she takes hydrochlorothiazide 12.5 mg as needed for lower extremity edema.   Assessment/Plan: Chronic illness with exacerbation -Stop amlodipine and hydrochlorothiazide -Monitor blood pressure at home and follow-up in 2 weeks.

## 2022-08-22 NOTE — Patient Instructions (Addendum)
Thank you, Ms.Valary Clent Ridges for allowing [place] to provide your care today.   I have ordered the following labs for you:  Lab Orders         CBC with Differential/Platelet         Fe+TIBC+Fer         B12         Folate         Zinc         Vitamin B6         CMP14+EGFR       Reminders: Stop amlodipine and hydrochlorothiazide.  If your systolic blood pressure ( top number) is above 160 , call clinic for instructions. Checking labs above for burning mouth syndrome.  Start gabapentin and follow-up in 2 weeks for burning mouth syndrome and high blood pressure.    Tamsen Snider, M.D.

## 2022-08-22 NOTE — Progress Notes (Signed)
   HPI:Heather Garrett is a 72 y.o. female who presents for follow up for dizziness and for burning sensation in her mouth. For the details of today's visit, please refer to the assessment and plan.  Physical Exam: Vitals:   08/22/22 0848  BP: 114/78  Pulse: 72  SpO2: 95%  Weight: 112 lb (50.8 kg)  Height: '4\' 8"'$  (1.422 m)     Physical Exam HENT:     Mouth/Throat:     Mouth: Mucous membranes are moist. No lacerations or oral lesions.     Pharynx: No posterior oropharyngeal erythema.  Cardiovascular:     Rate and Rhythm: Normal rate and regular rhythm.     Heart sounds: No murmur heard. Pulmonary:     Effort: Pulmonary effort is normal.     Breath sounds: No wheezing, rhonchi or rales.      Assessment & Plan:   Hypertension She has history listed of vertigo and hypertension. Patient presents for follow up after presenting to ED with dizziness. In the ED she was hypotensive.  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 She was given IV fluids and meclizine.  She had improvement in her symptoms and was discharged from ED with follow-up with PCP recommendations.  Patient has not taken her blood pressure medication this morning and her blood pressure is 114/78.  She has not had any dizziness at home.  On review of labs the ED she had a new anemia trending 11.6>8.8>7.5.  Reports she takes hydrochlorothiazide 12.5 mg as needed for lower extremity edema.   Assessment/Plan: Chronic illness with exacerbation -Stop amlodipine and hydrochlorothiazide -Monitor blood pressure at home and follow-up in 2 weeks.    Lower extremity edema Patient continues to have lower extremity edema with 2+ pitting edema to her ankles.  She is currently on amlodipine 5 mg.  She does believe this could have started with amlodipine.    Assessment/Plan: Chronic illness with exacerbation -Review of TTE 05/30/2022 shows normal EF and normal right side systolic function.   -Recommend wearing compression stockings and elevating legs above patient's heart while resting.  Stop hydrochlorothiazide. She has had hypotension with this treatment. - Stop amlodipine 5 mg, as it may contribute although usually seen at 10 mg dose -Patient has follow-up in 2 weeks and will evaluated at this visit.    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 She has been prescribed oral lidocaine and it helps for about 45 minutes before pain returns. No oral lesions.   Assessment/Plan: Acute uncomplicated illness - Will further workup with labs below. Start Gabapentin for pain.  - Fe+TIBC+Fer - B12 - Folate - Zinc - Vitamin B6 - gabapentin (NEURONTIN) 100 MG capsule; Take 1 capsule (100 mg total) by mouth 3 (three) times daily.  Dispense: 90 capsule; Refill: 0 - CBC with Differential/Platelet    Lorene Dy, MD

## 2022-08-22 NOTE — Assessment & Plan Note (Signed)
Patient has burning in her mouth and reports being given diagnosis 2 weeks ago. She has been prescribed oral lidocaine and it helps for about 45 minutes before pain returns. No oral lesions.   Assessment/Plan: Acute uncomplicated illness - Will further workup with labs below. Start Gabapentin for pain.  - Fe+TIBC+Fer - B12 - Folate - Zinc - Vitamin B6 - gabapentin (NEURONTIN) 100 MG capsule; Take 1 capsule (100 mg total) by mouth 3 (three) times daily.  Dispense: 90 capsule; Refill: 0 - CBC with Differential/Platelet

## 2022-08-24 ENCOUNTER — Telehealth: Payer: Self-pay | Admitting: Family Medicine

## 2022-08-24 NOTE — Telephone Encounter (Signed)
Patient called in regard to traMADol (ULTRAM) 50 MG tablet [id number]  DISCONTINUED     Patient has lost prescription, thinks husband threw meds in trash on accident.   Wants to see if can get refill sent in

## 2022-08-25 ENCOUNTER — Ambulatory Visit (HOSPITAL_COMMUNITY): Payer: Medicare PPO | Admitting: Physical Therapy

## 2022-08-25 DIAGNOSIS — H8112 Benign paroxysmal vertigo, left ear: Secondary | ICD-10-CM

## 2022-08-25 DIAGNOSIS — M6281 Muscle weakness (generalized): Secondary | ICD-10-CM | POA: Diagnosis not present

## 2022-08-25 DIAGNOSIS — R2681 Unsteadiness on feet: Secondary | ICD-10-CM | POA: Diagnosis not present

## 2022-08-25 DIAGNOSIS — R42 Dizziness and giddiness: Secondary | ICD-10-CM | POA: Diagnosis not present

## 2022-08-25 NOTE — Therapy (Signed)
OUTPATIENT PHYSICAL THERAPY VESTIBULAR TREATMENT     Patient Name: Heather Garrett MRN: [id number] DOB:1951-01-04, 72 y.o., female Today's Date: 08/25/2022  END OF SESSION:  PT End of Session - 08/25/22 1447     Visit Number 3    Number of Visits 6    Date for PT Re-Evaluation 09/02/22    Authorization Type [employer] Choice    Authorization Time Period auth submitted please check    PT Start Time 1352    PT Stop Time 1435    PT Time Calculation (min) 43 min    Activity Tolerance Patient tolerated treatment well    Behavior During Therapy WFL for tasks assessed/performed             Past Medical History:  Diagnosis Date   Allergy    SEASONAL   Anemia    "YEAR IN HALF AGO "   Anxiety    Arthritis    OSTEOARTHRITIS   Biceps tendinitis    Bladder cancer ([place]) 2014   Breast cancer ([place]) [place] 2 YEARS AGO UPDATED 05/03/22   Chronic diastolic CHF (congestive heart failure) (HCC)    Cobalamin deficiency    Coronary arteriosclerosis in native artery    Diabetes mellitus without complication ([place])    "YEARS AGO WHEN i WAS OVER WEIGHTBUT NOT NOW" UPDATED 05/03/22   Elevated liver function tests    GERD (gastroesophageal reflux disease)    "A LITTLE"   Heart murmur    Hiatal hernia    Hyperlipidemia    Hypertension    Migraine    Myocardial infarction ([place])    Rotator cuff syndrome    Sleep apnea    "YES BUT RESOLVED",UPDATED 05/03/22   Transitional cell carcinoma, 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[employer])    Tumor    "ON KIDNEY,REMOVED NO PROBLEMS SINCE",2014,RIGHT KIDNEY   Vitamin D deficiency    Past Surgical History:  Procedure Laterality Date   BREAST SURGERY     CARDIAC CATHETERIZATION     CATARACT EXTRACTION     CHOLECYSTECTOMY  1979   DG  BONE DENSITY ([place] HX)     ENDOMETRIAL ABLATION     GASTRIC BYPASS     MASTECTOMY     OTHER SURGICAL HISTORY     CANCER SURGERY   TOTAL HIP ARTHROPLASTY Left 09/06/2021   Procedure: TOTAL HIP  ARTHROPLASTY;  Surgeon: Willaim Sheng, MD;  Location: [facility];  Service: Orthopedics;  Laterality: Left;   Patient Active Problem List   Diagnosis Date Noted   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   Sleep disturbance 07/06/2022   Hordeolum of left lower eyelid 06/27/2022   Positive screening for depression on 9-item Patient Health Questionnaire (PHQ-9) 06/27/2022   Nausea and vomiting 03/17/2022   Left ankle pain 02/20/2022   Acute respiratory failure with hypoxia (HCC) 12/10/2021   Severe sepsis ([place]) 12/10/2021   Multifocal pneumonia 12/10/2021   GERD (gastroesophageal reflux disease) 12/10/2021   Depression 12/10/2021   Chronic diastolic CHF (congestive heart failure) ([place]) 12/10/2021   Chronic anemia 12/10/2021   UTI (urinary tract infection) 11/28/2021   Prolonged QT interval 11/28/2021   Normocytic anemia 09/05/2021   Hyperkalemia 09/05/2021   Triggering of finger 05/17/2021   Heart disease 05/17/2021   Malignant tumor of breast ([place]) 05/17/2021   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[place]) 05/17/2021   Hypertension 05/17/2021   Lower extremity edema 05/14/2021   Benign paroxysmal positional vertigo of left ear 03/23/2021   Daytime  somnolence 03/23/2021   History of sleep apnea 03/23/2021   Orthostatic hypotension 03/23/2021   Migraine without aura and without status migrainosus, not intractable 03/23/2021   Coronary arteriosclerosis in native artery 09/28/2016   Elevated LFTs 09/28/2016   Rotator cuff syndrome 09/28/2016   Biceps tendinitis 09/28/2016   Cobalamin deficiency 09/28/2016   Hyperlipidemia 09/28/2016   Migraine 09/28/2016   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 States turning her head makes her nauseated.  Pt states that she is in a recliner most the day  Pt accompanied by: significant other  PERTINENT HISTORY: history of migraine; THA in March of last year; fall in June of last year with pelvis fx; WC bound  PAIN:  Are you having pain? Yes: NPRS scale: 7/10 Pain location: left side hip Pain description: sore; weak Aggravating factors: standing, movement Relieving factors: sit and rest  PRECAUTIONS: Fall  WEIGHT BEARING RESTRICTIONS: No  FALLS: Has patient fallen in last 6 months? Yes. Number of falls 1  LIVING ENVIRONMENT: Lives with: lives with their spouse Lives in: House/apartment Stairs:  unknown Has following equipment at home: Environmental consultant - 2 wheeled and Wheelchair (manual)  PLOF: Needs assistance with ADLs, Needs assistance with gait, and Needs assistance with transfers  PATIENT GOALS: not to be dizzy  OBJECTIVE:   DIAGNOSTIC FINDINGS:   CLINICAL DATA:  Left hip pain after fall in March of 2023. History of breast cancer   EXAM: CT OF THE LEFT HIP WITHOUT CONTRAST   TECHNIQUE: Multidetector CT imaging of the left hip was performed according to the standard protocol. Multiplanar CT image reconstructions were also generated.   RADIATION DOSE REDUCTION: This exam was performed according to the departmental dose-optimization program which includes automated exposure control, adjustment of the mA and/or kV according to patient size and/or use of iterative reconstruction technique.   COMPARISON:  X-ray 03/02/2022, CT 09/06/2021   FINDINGS: Bones/Joint/Cartilage   Postsurgical changes of left total hip arthroplasty.  Arthroplasty components are in their expected alignment without dislocation. No periprosthetic lucency. There is an acute to subacute appearing nondisplaced periprosthetic fracture involving the intertrochanteric and proximal subtrochanteric femur (series 6, images 47-54). No additional fractures. Healed right superior and inferior pubic rami fractures.   Ligaments   Suboptimally assessed by CT.   Muscles and Tendons   Generalized muscle atrophy, particularly affecting the gluteal musculature. No acute musculotendinous abnormality.   Soft tissues   No organized fluid collection or hematoma. No left inguinal lymphadenopathy. Extensive atherosclerotic vascular calcifications.   IMPRESSION: Acute to subacute appearing nondisplaced periprosthetic fracture involving the intertrochanteric and proximal subtrochanteric left femur.   These results will be called to the ordering clinician or representative by the Radiologist Assistant, and communication documented in the PACS or [employer].     Electronically Signed   By: Davina Poke D.O.   On: 03/19/2022 13:32   COGNITION: Overall cognitive status: Within functional limits for tasks assessed   SENSATION: Not tested  EDEMA:  Noted swelling left leg  POSTURE:  rounded shoulders, forward head, and flexed trunk  in WC  Cervical ROM:    Active AROM (deg) eval  Flexion wfl  Extension "Dizzy"  Right lateral flexion wfl  Left lateral flexion wfl  Right rotation wfl  Left rotation wfl  (Blank rows = not tested)  STRENGTH: not tested  LOWER EXTREMITY MMT: bilateral leg weakness left leg > right leg  MMT Right eval Left eval  Hip flexion    Hip abduction    Hip adduction    Hip internal rotation    Hip external rotation    Knee flexion    Knee extension    Ankle dorsiflexion    Ankle plantarflexion    Ankle inversion    Ankle eversion    (Blank rows = not tested)  BED MOBILITY:  Sit to supine  Min A Supine to sit Min A Rolling to Left Min A  TRANSFERS: Assistive device utilized: Wheelchair (manual)  Sit to stand: Min A Stand to sit: Min A Chair to chair: Min A GAIT: Gait pattern: decreased step length- Right and decreased step length- Left Distance walked: 2 ft Assistive device utilized:  PT hand held assist and None Level of assistance: Min A Comments: patient at risk for falls  FUNCTIONAL TESTS:  30 seconds chair stand test x 0  PATIENT SURVEYS:  DHI 66  (severe handicap)  VESTIBULAR ASSESSMENT:  GENERAL OBSERVATION: glasses   SYMPTOM BEHAVIOR:  Subjective history: see above  Non-Vestibular symptoms: migraine symptoms  Type of dizziness: Spinning/Vertigo  Frequency: varies  Duration: brief  Aggravating factors: Induced by position change: supine to sit and Induced by motion: turning body quickly and turning head quickly  Relieving factors: medication and closing eyes  Progression of symptoms: worse  OCULOMOTOR EXAM:  Ocular Alignment: normal  Ocular ROM: No Limitations  Spontaneous Nystagmus: absent  Gaze-Induced Nystagmus: absent  Smooth Pursuits: intact  Saccades:  not tested  Convergence/Divergence: not measured cm     VESTIBULAR - OCULAR REFLEX: not tested    POSITIONAL TESTING: Right Dix-Hallpike: upbeating, right nystagmus  MOTION SENSITIVITY:  Motion Sensitivity Quotient Intensity: 0 = none, 1 = Lightheaded, 2 = Mild, 3 = Moderate, 4 = Severe, 5 = Vomiting  Intensity-eval 08/15/22 08/25/2022  1. Sitting to supine 1    2. Supine to L side 3    3. Supine to R side     4. Supine to sitting 2 2   5. L Hallpike-Dix  3   6. Up from L      7. R Hallpike-Dix 3 2   8. Up from R      9. Sitting, head tipped to L knee     10. Head up from L knee     11. Sitting, head tipped to R knee     12. Head up from R knee     13. Sitting head turns x5   5  14.Sitting head nods x5     15. In stance, 180 turn to L      16. In stance, 180 turn to R        OTHOSTATICS: not done  FUNCTIONAL GAIT: 30 seconds chair stand test 0   VESTIBULAR TREATMENT:  DATE:  08/25/2022 Sititng head turns x 5 B pt becomes nauseated and vomits. Dix HallPike (+) to RT with Eply x 2 with good results  Marye Round  (+) to Lt with Epley x2  with good results  Seated Ankle Pumps  -x5  Seated Long Arc Quad  x5  - Seated March  x5  - Seated Hip Abduction/adduction x 5   08/15/2022 Review of goals DHP to right and left Epley maneuver for left posterior canal x 2  08/12/2022 physical therapy evaluation  Canalith Repositioning:  Epley Right: Number of Reps: 1 and Response to Treatment: comment: dizziness with turn to the left; no dizziness on sitting but nauseated  PATIENT EDUCATION: 08/25/2022 Access Code: [email or web address] URL: [email or web address] Date: 08/25/2022 Prepared by: Rayetta Humphrey  Exercises - Seated Ankle Pumps  - 2 x daily - 7 x weekly - 3 sets - 10 reps - 2-3" hold - Seated Long Arc Quad  - 2 x daily - 7 x weekly - 3 sets - 10 reps - 2-3" hold - Seated March  - 2 x daily - 7 x weekly - 3 sets - 10 reps - 2-3" hold - Seated Hip Abduction  - 2 x daily - 7 x weekly - 3 sets - 10 reps - 2-3" hold Eval:  Education details: Patient educated on exam findings, POC, scope of PT, HEP, and what to expect next visit. Person educated: Patient Education method: Explanation, Demonstration, and Handouts Education comprehension: verbalized understanding, returned demonstration, verbal cues required, and tactile cues required   HOME EXERCISE PROGRAM:  GOALS: Goals reviewed with patient? Yes  SHORT TERM GOALS: Target date: 08/26/2022  Patient will self report 50% improvement to improve tolerance for functional activity  Baseline: Goal status: IN PROGRESS     LONG TERM GOALS: Target date: 09/02/2022  Patient will improve DHI score (  decrease by 20 points) to demonstrate decreased dizziness with improved functional mobility  Baseline: 66 Goal status: IN PROGRESS  2.  Patient will be free of falls Baseline: had a fall this past weekend 08/15/22 Goal status: IN PROGRESS  3.  Patient will have a negative DHP to the right to improve ability to sleep and get up from sleeping without dizziness Baseline:  Goal status: IN PROGRESS  4.  Patient will self report 75% improvement to improve tolerance for functional activity  Baseline:  Goal status: IN PROGRESS    ASSESSMENT:  CLINICAL IMPRESSION: Pt states she is still having dizziness.  Pt Marye Round is positive for both the Rt and the LT therefore Eply completed B today with good results.  Therapist gave pt HEP to begin strengthening for B LE . Patient needs min A for transfer from [facility] to mat table and with changing positions for testing and with Epley maneuver.    Patient will benefit from continued skilled therapy services to address deficits and promote return to optimal function.      OBJECTIVE IMPAIRMENTS: Abnormal gait, decreased activity tolerance, decreased balance, decreased endurance, decreased mobility, difficulty walking, decreased ROM, decreased strength, dizziness, hypomobility, increased fascial restrictions, impaired perceived functional ability, increased muscle spasms, impaired flexibility, and pain.   ACTIVITY LIMITATIONS: carrying, lifting, bending, sitting, standing, squatting, sleeping, stairs, transfers, bed mobility, locomotion level, and caring for others  PARTICIPATION LIMITATIONS: meal prep, cleaning, laundry, driving, shopping, community activity, and yard work  [employer] POTENTIAL: Good  CLINICAL DECISION MAKING: Evolving/moderate complexity  EVALUATION COMPLEXITY: Moderate   PLAN:  PT FREQUENCY: 2x/week  PT DURATION: 3  weeks  PLANNED INTERVENTIONS: Therapeutic exercises, Therapeutic activity, Neuromuscular re-education, Balance training,  Gait training, Patient/Family education, Joint manipulation, Joint mobilization, Stair training, Orthotic/Fit training, DME instructions, Aquatic Therapy, Dry Needling, Electrical stimulation, Spinal manipulation, Spinal mobilization, Cryotherapy, Moist heat, Compression bandaging, scar mobilization, Splintting, Taping, Traction, Ultrasound, Ionotophoresis 9m/ml Dexamethasone, and Manual therapy   PLAN FOR NEXT SESSION: retest both  [place] accordingly CRayetta Humphrey [id number] [id number]

## 2022-08-26 NOTE — Telephone Encounter (Signed)
Appt scheduled pt aware

## 2022-08-26 NOTE — Telephone Encounter (Signed)
Kindly as the patient to schedule an appt

## 2022-08-29 ENCOUNTER — Encounter: Payer: Self-pay | Admitting: Family Medicine

## 2022-08-29 ENCOUNTER — Ambulatory Visit: Payer: Medicare PPO | Admitting: Family Medicine

## 2022-08-29 VITALS — BP 125/88 | HR 112 | Ht <= 58 in | Wt 112.0 lb

## 2022-08-29 DIAGNOSIS — H8112 Benign paroxysmal vertigo, left ear: Secondary | ICD-10-CM | POA: Diagnosis not present

## 2022-08-29 DIAGNOSIS — R42 Dizziness and giddiness: Secondary | ICD-10-CM

## 2022-08-29 DIAGNOSIS — I951 Orthostatic hypotension: Secondary | ICD-10-CM

## 2022-08-29 NOTE — Patient Instructions (Addendum)
I appreciate the opportunity to provide care to you today!    Follow up:  09/09/2022  Vertigo I recommend increasing your fluid intake by at least 64 ounces of water daily I recommend increasing your calorie intake A referral has been placed to ENT for further evaluation of your vertigo Do lie still in a quiet, dark room to reduce the spinning feeling. move your head carefully and slowly during daily activities. sit down straight away when you feel dizzy. turn on the lights if you get up at night. use a walking stick if you're at risk of falling. sleep with your head slightly raised on 2 or more pillows.  DON'T do not bend over to pick things up - squat to lower yourself instead do not stretch your neck - for example, while reaching up to a high shelf   Rehab facility I will contact you this week with recommendations for the best rehab facility to be admitted     Referral: ENT    Please continue to a heart-healthy diet and increase your physical activities. Try to exercise for 39mns at least five times a week.      It was a pleasure to see you and I look forward to continuing to work together on your health and well-being. Please do not hesitate to call the office if you need care or have questions about your care.   Have a wonderful day and week. With Gratitude, GAlvira MondayMSN, FNP-BC

## 2022-08-30 ENCOUNTER — Encounter: Payer: Self-pay | Admitting: Gastroenterology

## 2022-08-30 ENCOUNTER — Ambulatory Visit: Payer: Medicare PPO | Admitting: Gastroenterology

## 2022-08-30 ENCOUNTER — Telehealth: Payer: Self-pay

## 2022-08-30 VITALS — BP 118/78 | HR 45 | Ht <= 58 in

## 2022-08-30 DIAGNOSIS — R1319 Other dysphagia: Secondary | ICD-10-CM | POA: Diagnosis not present

## 2022-08-30 DIAGNOSIS — Z86010 Personal history of colonic polyps: Secondary | ICD-10-CM

## 2022-08-30 DIAGNOSIS — R1310 Dysphagia, unspecified: Secondary | ICD-10-CM

## 2022-08-30 LAB — CBC WITH DIFFERENTIAL/PLATELET

## 2022-08-30 LAB — CMP14+EGFR
ALT: 20 IU/L (ref 0–32)
AST: 23 IU/L (ref 0–40)
Albumin/Globulin Ratio: 0.7 — ABNORMAL LOW (ref 1.2–2.2)
Albumin: 2.5 g/dL — ABNORMAL LOW (ref 3.8–4.8)
Alkaline Phosphatase: 224 IU/L — ABNORMAL HIGH (ref 44–121)
BUN/Creatinine Ratio: 8 — ABNORMAL LOW (ref 12–28)
BUN: 10 mg/dL (ref 8–27)
Bilirubin Total: 0.5 mg/dL (ref 0.0–1.2)
CO2: 17 mmol/L — ABNORMAL LOW (ref 20–29)
Calcium: 8.4 mg/dL — ABNORMAL LOW (ref 8.7–10.3)
Chloride: 99 mmol/L (ref 96–106)
Creatinine, Ser: 1.29 mg/dL — ABNORMAL HIGH (ref 0.57–1.00)
Globulin, Total: 3.6 g/dL (ref 1.5–4.5)
Glucose: 104 mg/dL — ABNORMAL HIGH (ref 70–99)
Potassium: 4 mmol/L (ref 3.5–5.2)
Sodium: 135 mmol/L (ref 134–144)
Total Protein: 6.1 g/dL (ref 6.0–8.5)
eGFR: 44 mL/min/{1.73_m2} — ABNORMAL LOW (ref >59)

## 2022-08-30 LAB — IRON,TIBC AND FERRITIN PANEL
Ferritin: 493 ng/mL — ABNORMAL HIGH (ref 15–150)
Iron Saturation: >85 % (ref 15–55)
Iron: 99 ug/dL (ref 27–139)
Total Iron Binding Capacity: <116 ug/dL — CL (ref 250–450)
UIBC: <17 ug/dL — ABNORMAL LOW (ref 118–369)

## 2022-08-30 LAB — VITAMIN B6: Vitamin B6: 2 ug/L — ABNORMAL LOW (ref 3.4–65.2)

## 2022-08-30 LAB — ZINC: Zinc: 41 ug/dL — ABNORMAL LOW (ref 44–115)

## 2022-08-30 LAB — VITAMIN B12: Vitamin B-12: >2000 pg/mL — ABNORMAL HIGH (ref 232–1245)

## 2022-08-30 LAB — FOLATE: Folate: 12.5 ng/mL (ref >3.0)

## 2022-08-30 MED ORDER — NA SULFATE-K SULFATE-MG SULF 17.5-3.13-1.6 GM/177ML PO SOLN: 1.0000 | ORAL | 0 refills | Status: DC

## 2022-08-30 NOTE — Progress Notes (Signed)
Established Patient Office Visit  Subjective:  Patient ID: Heather Garrett, female    DOB: April 02, 1951  Age: 72 y.o. MRN: [id number]  CC:  Chief Complaint  Patient presents with   Medication Refill    Refill for tramadol.     HPI Heather Garrett is a 72 y.o. 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  Past Medical History:  Diagnosis Date   Allergy    SEASONAL   Anemia    "YEAR IN HALF AGO "   Anxiety    Arthritis    OSTEOARTHRITIS   Biceps tendinitis    Bladder cancer ([place]) 2014   Breast cancer ([place]) 1990   Cataract    [place] 2 YEARS AGO UPDATED 05/03/22   Chronic diastolic CHF (congestive heart failure) (HCC)    Cobalamin deficiency    Coronary arteriosclerosis in native artery    Diabetes mellitus without complication ([place])    "YEARS AGO WHEN i WAS OVER WEIGHTBUT NOT NOW" UPDATED 05/03/22   Elevated liver function tests    GERD (gastroesophageal reflux disease)    "A LITTLE"   Heart murmur    Hiatal hernia    Hyperlipidemia    Hypertension    Migraine    Myocardial infarction ([employer])    Rotator cuff syndrome    Sleep apnea    "YES BUT RESOLVED",UPDATED 05/03/22   Transitional cell carcinoma, 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[place])    Tumor    "ON KIDNEY,REMOVED NO PROBLEMS SINCE",2014,RIGHT KIDNEY   Vitamin D deficiency     Past Surgical History:  Procedure Laterality Date   BREAST SURGERY     CARDIAC CATHETERIZATION     CATARACT EXTRACTION     CHOLECYSTECTOMY  1979   DG  BONE DENSITY ([place] [facility])     ENDOMETRIAL ABLATION     GASTRIC BYPASS     MASTECTOMY     OTHER SURGICAL HISTORY     CANCER SURGERY   TOTAL HIP ARTHROPLASTY Left 09/06/2021   Procedure: TOTAL HIP ARTHROPLASTY;  Surgeon: Willaim Sheng, MD;  Location: [facility];  Service:  Orthopedics;  Laterality: Left;    Family History  Problem Relation Age of Onset   Congestive Heart Failure Mother    Coronary artery disease Father    Heart attack Father 18       cause of death.   Diabetes Mellitus II Sister    Hypertension Sister    Breast cancer Sister    Migraines Neg Hx    Colon cancer Neg Hx    Esophageal cancer Neg Hx    Stomach cancer Neg Hx    Colon polyps Neg Hx    Crohn's disease Neg Hx    Rectal cancer Neg Hx    Ulcerative colitis Neg Hx     Social History   Socioeconomic History   Marital status: Married    Spouse name: Not on file   Number of children: Not on file   Years of education: Not on file   Highest education level: Not on file  Occupational History   Not on file  Tobacco Use   Smoking status: Former    Passive exposure: Never   Smokeless tobacco: Never   Tobacco comments:    Quit 30 yrs ago  Vaping Use   Vaping Use: Never used  Substance and Sexual Activity  Alcohol use: Yes    Alcohol/week: 14.0 standard drinks of alcohol    Types: 14 Glasses of wine per week    Comment: daily, 2glasses wine daily   Drug use: Never   Sexual activity: Not on file  Other Topics Concern   Not on file  Social History Narrative   Lives with husband in town home.   Caffiene coffee 1 cup, occasional sweet tea (rare)      Social Determinants of Health   Financial Resource Strain: Not on file  Food Insecurity: Not on file  Transportation Needs: Not on file  Physical Activity: Not on file  Stress: Not on file  Social Connections: Not on file  Intimate Partner Violence: Not on file    Outpatient Medications Prior to Visit  Medication Sig Dispense Refill   alendronate (FOSAMAX) 70 MG tablet 70 mg once a week.     aspirin 81 MG EC tablet Take 1 tablet (81 mg total) by mouth every other day. Swallow whole.  Hold aspirin while taking eliquis, (Patient taking differently: Take 81 mg by mouth daily. Swallow whole.  Hold aspirin while  taking eliquis,) 30 tablet 0   Atogepant (QULIPTA) 60 MG TABS Take 1 tablet (60 mg total) by mouth daily. 30 tablet 11   atorvastatin (LIPITOR) 40 MG tablet Take 40 mg by mouth daily.     carvedilol (COREG) 3.125 MG tablet Take 1 tablet (3.125 mg total) by mouth 2 (two) times daily. Hold if sbp less than 100 or heart rate less than 50 180 tablet 3   celecoxib (CELEBREX) 100 MG capsule Take 1 capsule by mouth twice a day with meals (Patient taking differently: Take 100 mg by mouth 2 (two) times daily.) 28 capsule 0   cephALEXin (KEFLEX) 500 MG capsule Take 500 mg by mouth 2 (two) times daily.     ezetimibe (ZETIA) 10 MG tablet Take 1 tablet (10 mg total) by mouth daily. 90 tablet 3   famotidine (PEPCID) 40 MG tablet Take 1 tablet (40 mg total) by mouth as needed. 30 tablet 2   ferrous sulfate 325 (65 FE) MG EC tablet Take 1 tablet (325 mg total) by mouth every Monday, Wednesday, and Friday. 30 tablet 0   folic acid (FOLVITE) 1 MG tablet Take 1 tablet (1 mg total) by mouth daily.  3   gabapentin (NEURONTIN) 100 MG capsule Take 1 capsule (100 mg total) by mouth 3 (three) times daily. 90 capsule 0   lidocaine (XYLOCAINE) 2 % solution Use as directed 5 mLs in the mouth or throat every 3 (three) hours as needed for mouth pain. 100 mL 0   meclizine (ANTIVERT) 12.5 MG tablet Take 1 tablet (12.5 mg total) by mouth 3 (three) times daily as needed for dizziness. 30 tablet 0   nystatin-triamcinolone ointment (MYCOLOG) 1 Topical Daily     pantoprazole (PROTONIX) 40 MG tablet Take 1 tablet (40 mg total) by mouth 2 (two) times daily before a meal. (Patient taking differently: Take 40 mg by mouth daily.) 60 tablet 2   Polyethylene Glycol 400 (BLINK TEARS) 0.25 % SOLN Place 1 drop into both eyes daily as needed (dry eyes).     sertraline (ZOLOFT) 100 MG tablet Take 1 tablet (100 mg total) by mouth daily. 30 tablet 3   simvastatin (ZOCOR) 20 MG tablet Take 1 tablet (20 mg total) by mouth at bedtime. 90 tablet 3    tiZANidine (ZANAFLEX) 4 MG tablet Take 1 tablet (4 mg total) by  mouth at bedtime. 30 tablet 2   trimethobenzamide (TIGAN) 300 MG capsule Take 1 capsule (300 mg total) by mouth 3 (three) times daily. 15 capsule 0   Ubrogepant (UBRELVY) 100 MG TABS Take 100 mg by mouth daily as needed (migraine headaches). Can repeat x1 after 2 hours if needed. Max dose 214m/24 hours 10 tablet 5   No facility-administered medications prior to visit.    Allergies  Allergen Reactions   Contrast Media [Iodinated Contrast Media] Anaphylaxis and Hives   Ciprofloxacin Other (See Comments)    Interaction w other medicine   Erythromycin Base Nausea And Vomiting   Sulfabenzamide Other (See Comments)    UNK reaction    ROS Review of Systems  Constitutional:  Negative for chills and fever.  Eyes:  Negative for visual disturbance.  Respiratory:  Negative for chest tightness and shortness of breath.   Neurological:  Positive for dizziness. Negative for headaches.      Objective:    Physical Exam HENT:     Head: Normocephalic.     Mouth/Throat:     Mouth: Mucous membranes are moist.  Cardiovascular:     Rate and Rhythm: Normal rate.     Heart sounds: Normal heart sounds.  Pulmonary:     Effort: Pulmonary effort is normal.     Breath sounds: Normal breath sounds.  Neurological:     Mental Status: She is alert.     BP 125/88   Pulse (!) 112   Ht 4' 8"$  (1.422 m)   Wt 112 lb (50.8 kg)   SpO2 98%   BMI 25.11 kg/m  Wt Readings from Last 3 Encounters:  08/29/22 112 lb (50.8 kg)  08/22/22 112 lb (50.8 kg)  08/17/22 112 lb (50.8 kg)    Lab Results  Component Value Date   TSH 4.290 05/17/2022   Lab Results  Component Value Date   WBC CANCELED 08/22/2022   HGB CANCELED 08/22/2022   HCT CANCELED 08/22/2022   MCV CANCELED 08/22/2022   PLT CANCELED 08/22/2022   Lab Results  Component Value Date   NA 135 08/22/2022   K 4.0 08/22/2022   CO2 17 (L) 08/22/2022   GLUCOSE 104 (H) 08/22/2022    BUN 10 08/22/2022   CREATININE 1.29 (H) 08/22/2022   BILITOT 0.5 08/22/2022   ALKPHOS 224 (H) 08/22/2022   AST 23 08/22/2022   ALT 20 08/22/2022   PROT 6.1 08/22/2022   ALBUMIN 2.5 (L) 08/22/2022   CALCIUM 8.4 (L) 08/22/2022   ANIONGAP 9 08/17/2022   EGFR 44 (L) 08/22/2022   GFR 72.00 06/16/2021   Lab Results  Component Value Date   CHOL 221 (H) 02/09/2022   Lab Results  Component Value Date   HDL 155 02/09/2022   Lab Results  Component Value Date   LDLCALC 52 02/09/2022   Lab Results  Component Value Date   TRIG 86 02/09/2022   Lab Results  Component Value Date   CHOLHDL 1.4 02/09/2022   Lab Results  Component Value Date   HGBA1C 5.1 02/09/2022      Assessment & Plan:  Benign paroxysmal positional vertigo of left ear Assessment & Plan: She was seen in the ED on 08/17/2022 with complaints of vertigo Imaging studies were unremarkable Her labs were unremarkable She reports minimal p.o. intake and fluid consumption Encouraged to increase her p.o. intake and drink at least 64 ounces of water daily She was discharged home  with meclizine 12.5 mg to take as needed She reports  starting physical therapy to relieve symptoms of vertigo with minimal relief of her symptoms Will place a referral to ENT Conservative management encouraged Education as follow Do lie still in a quiet, dark room to reduce the spinning feeling. move your head carefully and slowly during daily activities. sit down straight away when you feel dizzy. turn on the lights if you get up at night. use a walking stick if you're at risk of falling. sleep with your head slightly raised on 2 or more pillows.  DON'T do not bend over to pick things up - squat to lower yourself instead do not stretch your neck - for example, while reaching up to a high shelf   Vertigo -     Ambulatory referral to ENT  Orthostatic hypotension Assessment & Plan: She reports that her daughter and her husband think that  it would be best for her to be admitted to a rehab center The patient is inquiring about recommendations for a rehab facility Inform the patient to research various rehab facilities and select the facility that best meets her needs Inform the patient to bring the [facility] form for me to complete     Follow-up: Return in about 11 days (around 09/09/2022).   [name], FNP

## 2022-08-30 NOTE — Telephone Encounter (Signed)
     Patient  visit on 08/17/2022  at [facility] was for dizziness, vertigo.  Have you been able to follow up with your primary care physician? Patient has appointment scheduled.  The patient was or was not able to obtain any needed medicine or equipment. Patient was able to obtain medication.  Are there diet recommendations that you are having difficulty following? No  Patient expresses understanding of discharge instructions and education provided has no other needs at this time. Yes   [place] Resource Care Guide   [email or web address]  ?? [id number]   Website: [email or web address]  [email or web address]

## 2022-08-30 NOTE — Assessment & Plan Note (Signed)
She was seen in the ED on 08/17/2022 with complaints of vertigo Imaging studies were unremarkable Her labs were unremarkable She reports minimal p.o. intake and fluid consumption Encouraged to increase her p.o. intake and drink at least 64 ounces of water daily She was discharged home  with meclizine 12.5 mg to take as needed She reports starting physical therapy to relieve symptoms of vertigo with minimal relief of her symptoms Will place a referral to ENT Conservative management encouraged Education as follow Do lie still in a quiet, dark room to reduce the spinning feeling. move your head carefully and slowly during daily activities. sit down straight away when you feel dizzy. turn on the lights if you get up at night. use a walking stick if you're at risk of falling. sleep with your head slightly raised on 2 or more pillows.  DON'T do not bend over to pick things up - squat to lower yourself instead do not stretch your neck - for example, while reaching up to a high shelf

## 2022-08-30 NOTE — Patient Instructions (Signed)
We have sent the following medications to your pharmacy for you to pick up at your convenience: [place] have been scheduled for an endoscopy and colonoscopy. Please follow the written instructions given to you at your visit today. Please pick up your prep supplies at the pharmacy within the next 1-3 days. If you use inhalers (even only as needed), please bring them with you on the day of your procedure.  You have been scheduled for a Barium Esophogram at [facility] (1st floor of the hospital) on 09/14/22 at 10:00 AM. Please arrive 30 minutes prior to your appointment for registration. Make certain not to have anything to eat or drink 3 hours prior to your test. If you need to reschedule for any reason, please contact radiology at [phone] to do so. __________________________________________________________________ A barium swallow is an examination that concentrates on views of the esophagus. This tends to be a double contrast exam (barium and two liquids which, when combined, create a gas to distend the wall of the oesophagus) or single contrast (non-ionic iodine based). The study is usually tailored to your symptoms so a good history is essential. Attention is paid during the study to the form, structure and configuration of the esophagus, looking for functional disorders (such as aspiration, dysphagia, achalasia, motility and reflux) EXAMINATION You may be asked to change into a gown, depending on the type of swallow being performed. A radiologist and radiographer will perform the procedure. The radiologist will advise you of the type of contrast selected for your procedure and direct you during the exam. You will be asked to stand, sit or lie in several different positions and to hold a small amount of fluid in your mouth before being asked to swallow while the imaging is performed .In some instances you may be asked to swallow barium coated marshmallows to assess the motility of a  solid food bolus. The exam can be recorded as a digital or video fluoroscopy procedure. POST PROCEDURE It will take 1-2 days for the barium to pass through your system. To facilitate this, it is important, unless otherwise directed, to increase your fluids for the next 24-48hrs and to resume your normal diet.  This test typically takes about 30 minutes to perform. ______________________________________________________________________   _______________________________________________________  If your blood pressure at your visit was 140/90 or greater, please contact your primary care physician to follow up on this.  _______________________________________________________  If you are age 15 or older, your body mass index should be between 23-30. Your Body mass index is 25.11 kg/m. If this is out of the aforementioned range listed, please consider follow up with your Primary Care Provider.  If you are age 5 or younger, your body mass index should be between 19-25. Your Body mass index is 25.11 kg/m. If this is out of the aformentioned range listed, please consider follow up with your Primary Care Provider.   ________________________________________________________  The [facility] providers would like to encourage you to use [facility] to communicate with providers for non-urgent requests or questions.  Due to long hold times on the telephone, sending your provider a message by [facility] may be a faster and more efficient way to get a response.  Please allow 48 business hours for a response.  Please remember that this is for non-urgent requests.  _______________________________________________________ Thank you for choosing me and [facility].  Dr. Dustin Flock

## 2022-08-30 NOTE — Progress Notes (Unsigned)
HPI : Heather Garrett is a very pleasant 72 year old female with a history of CAD, HFeEF, bladder cancer, s/p gastric bypass and alcohol-induced liver disease and colon polyps presents for further evaluation of dysphagia.   This has been going on for a few weeks and has not been progressive.  She describes the sensation of food sitting in her throat (points to the level of thyroid cartilage) when she eats certain foods.  She notes that meats cause the most problems.  She has had several episodes where she has had to vomit back up food that would not go down.  She often stops eating after only 5-6 bites because of her symptoms, and then will try to finish the meal later.  Drinking water often helps get food down.  No liquid dysphagia, but she does have problems with pills as well.  She denies any GERD symptoms such as heartburn or acid regurgitation.  She continues to have problems with a constant RUQ discomfort that has been bothering her for many years.  No pain with swallowing.  Her weight has been stable, but she has had a decline in her functional status because of progressive lower extremity weakness.  She is now mostly confined to a wheelchair.  She requires significant assistance from her husband with transfers from chair to bed.    She was initially seen by me March 08, 2021 with a chief complaint of postprandial epigastric pain with weight loss and sitophobia.  Based on her symptoms and risk factors, there is high suspicion for chronic mesenteric ischemia, however ultrasound with Dopplers was not consistent with this.  She underwent an EGD and colonoscopy.  The EGD showed enlarged gastric pouch, friable gastrojejunal anastomosis and hiatal hernia, but was otherwise unremarkable.  Colonoscopy was notable for 10 adenomatous polyps and she was recommended repeat colonoscopy in 1 year.  This was scheduled for November 2023, but was cancelled by the patient because of low blood pressures.  At her  follow-up in December 2022, she reported that her abdominal pain had improved.  Her weight had stabilized.  She complained of problems with diarrhea with urgency at that time, and was recommended to start taking Metamucil and as needed Imodium.  At her follow-up in April 2023, she complained of both diarrhea and constipation. The constipation had started after surgery for a hip replacement. A plain film of the abdomen at that time showed moderate stool retention and it was thought that her diarrhea is more likely related to overflow incontinence. She was recommended to take MiraLAX 1-2 times per day in addition to [place].   She was noted to have elevated liver enzymes with AST predominance as well as a macrocytosis. She admitted to excessive alcohol use but was engaged with a counselor to help curb her drinking. An evaluation for other causes of chronic liver disease was negative. She was noted to have hepatic steatosis on imaging, without any evidence of portal hypertension.  Today, she reports that she still drinks alcohol regularly, typically not more than one glass of wine in a day.  Today she reports that she is bothered by persistent vertigo.  She has been dealing with persistent symptoms of room spinning and nausea for about 2 weeks.  Previous GI evaluation:  Doppler study of mesenteric vessels negative for chronic intestinal ischemia  EGD 05/27/2021  enlarged gastric pouch with a hiatal hernia, but was otherwise unremarkable.  No marginal ulcer present.     Colonoscopy 05/27/2021 10 adenomatous  polyps (at least 3 > 1 cm)   CT abdomen without contrast due to allergy 01/26/2021 Normal liver, status postcholecystectomy without biliary dilatation, normal pancreas, normal spleen normal adrenals, stomach and bowel suboptimally evaluated, but small hiatal hernia, no bowel wall thickening or bowel dilatation.  MRCP abdomen with and without contrast for bladder cancer  08/17/2021  showed  hepatic steatosis pancreas no mass no inflammatory changes no abnormality. Patient did have severe collapse of left femoral head with underlying marrow edema and enhancement. S/p left total hip 09/06/21 and states she is improving.   Negative liver disease evaluation (viral, autoimmune, genetic) Dec 2022  Past Medical History:  Diagnosis Date   Allergy    SEASONAL   Anemia    "YEAR IN HALF AGO "   Anxiety    Arthritis    OSTEOARTHRITIS   Biceps tendinitis    Bladder cancer ([place]) 2014   Breast cancer ([place]) 1990   Cataract    [place] 2 YEARS AGO UPDATED 05/03/22   Chronic diastolic CHF (congestive heart failure) (HCC)    Cobalamin deficiency    Coronary arteriosclerosis in native artery    Diabetes mellitus without complication ([place])    "YEARS AGO WHEN i WAS OVER WEIGHTBUT NOT NOW" UPDATED 05/03/22   Elevated liver function tests    GERD (gastroesophageal reflux disease)    "A LITTLE"   Heart murmur    Hiatal hernia    Hyperlipidemia    Hypertension    Migraine    Myocardial infarction ([place])    Rotator cuff syndrome    Sleep apnea    "YES BUT RESOLVED",UPDATED 05/03/22   Transitional cell carcinoma, 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[place])    Tumor    "ON KIDNEY,REMOVED NO PROBLEMS SINCE",2014,RIGHT KIDNEY   Vitamin D deficiency      Past Surgical History:  Procedure Laterality Date   BREAST SURGERY     CARDIAC CATHETERIZATION     CATARACT EXTRACTION     CHOLECYSTECTOMY  1979   DG  BONE DENSITY ([place] HX)     ENDOMETRIAL ABLATION     GASTRIC BYPASS     MASTECTOMY     OTHER SURGICAL HISTORY     CANCER SURGERY   TOTAL HIP ARTHROPLASTY Left 09/06/2021   Procedure: TOTAL HIP ARTHROPLASTY;  Surgeon: Willaim Sheng, MD;  Location: [facility];  Service: Orthopedics;  Laterality: Left;   Family History  Problem Relation Age of Onset   Congestive Heart Failure Mother    Coronary artery disease Father    Heart attack Father 4       cause of death.   Diabetes Mellitus II Sister     Hypertension Sister    Breast cancer Sister    Migraines Neg Hx    Colon cancer Neg Hx    Esophageal cancer Neg Hx    Stomach cancer Neg Hx    Colon polyps Neg Hx    Crohn's disease Neg Hx    Rectal cancer Neg Hx    Ulcerative colitis Neg Hx    Social History   Tobacco Use   Smoking status: Former    Passive exposure: Never   Smokeless tobacco: Never   Tobacco comments:    Quit 30 yrs ago  Vaping Use   Vaping Use: Never used  Substance Use Topics   Alcohol use: Yes    Alcohol/week: 14.0 standard drinks of alcohol    Types: 14 Glasses of wine per week    Comment: daily, 2glasses wine daily  Drug use: Never   Current Outpatient Medications  Medication Sig Dispense Refill   alendronate (FOSAMAX) 70 MG tablet 70 mg once a week.     aspirin 81 MG EC tablet Take 1 tablet (81 mg total) by mouth every other day. Swallow whole.  Hold aspirin while taking eliquis, (Patient taking differently: Take 81 mg by mouth daily. Swallow whole.  Hold aspirin while taking eliquis,) 30 tablet 0   Atogepant (QULIPTA) 60 MG TABS Take 1 tablet (60 mg total) by mouth daily. 30 tablet 11   atorvastatin (LIPITOR) 40 MG tablet Take 40 mg by mouth daily.     carvedilol (COREG) 3.125 MG tablet Take 1 tablet (3.125 mg total) by mouth 2 (two) times daily. Hold if sbp less than 100 or heart rate less than 50 180 tablet 3   celecoxib (CELEBREX) 100 MG capsule Take 1 capsule by mouth twice a day with meals (Patient taking differently: Take 100 mg by mouth 2 (two) times daily.) 28 capsule 0   cephALEXin (KEFLEX) 500 MG capsule Take 500 mg by mouth 2 (two) times daily.     ezetimibe (ZETIA) 10 MG tablet Take 1 tablet (10 mg total) by mouth daily. 90 tablet 3   famotidine (PEPCID) 40 MG tablet Take 1 tablet (40 mg total) by mouth as needed. 30 tablet 2   ferrous sulfate 325 (65 FE) MG EC tablet Take 1 tablet (325 mg total) by mouth every Monday, Wednesday, and Friday. (Patient taking differently: Take 325 mg by  mouth every Monday, Wednesday, and Friday. Pt taking once a week) 30 tablet 0   folic acid (FOLVITE) 1 MG tablet Take 1 tablet (1 mg total) by mouth daily.  3   gabapentin (NEURONTIN) 100 MG capsule Take 1 capsule (100 mg total) by mouth 3 (three) times daily. 90 capsule 0   lidocaine (XYLOCAINE) 2 % solution Use as directed 5 mLs in the mouth or throat every 3 (three) hours as needed for mouth pain. 100 mL 0   meclizine (ANTIVERT) 12.5 MG tablet Take 1 tablet (12.5 mg total) by mouth 3 (three) times daily as needed for dizziness. 30 tablet 0   nystatin-triamcinolone ointment (MYCOLOG) 1 Topical Daily     pantoprazole (PROTONIX) 40 MG tablet Take 1 tablet (40 mg total) by mouth 2 (two) times daily before a meal. (Patient taking differently: Take 40 mg by mouth daily.) 60 tablet 2   Polyethylene Glycol 400 (BLINK TEARS) 0.25 % SOLN Place 1 drop into both eyes daily as needed (dry eyes).     sertraline (ZOLOFT) 100 MG tablet Take 1 tablet (100 mg total) by mouth daily. 30 tablet 3   simvastatin (ZOCOR) 20 MG tablet Take 1 tablet (20 mg total) by mouth at bedtime. 90 tablet 3   tiZANidine (ZANAFLEX) 4 MG tablet Take 1 tablet (4 mg total) by mouth at bedtime. 30 tablet 2   trimethobenzamide (TIGAN) 300 MG capsule Take 1 capsule (300 mg total) by mouth 3 (three) times daily. 15 capsule 0   Ubrogepant (UBRELVY) 100 MG TABS Take 100 mg by mouth daily as needed (migraine headaches). Can repeat x1 after 2 hours if needed. Max dose 283m/24 hours 10 tablet 5   No current facility-administered medications for this visit.   Allergies  Allergen Reactions   Contrast Media [Iodinated Contrast Media] Anaphylaxis and Hives   Ciprofloxacin Other (See Comments)    Interaction w other medicine   Erythromycin Base Nausea And Vomiting   Sulfabenzamide Other (  See Comments)    UNK reaction     Review of Systems: All systems reviewed and negative except where noted in HPI.    DG Chest Port 1 View  Result  Date: 08/17/2022 CLINICAL DATA:  Dizziness with vertigo. EXAM: PORTABLE CHEST 1 VIEW COMPARISON:  Radiographs 12/09/2021 and 12/01/2021.  CT 01/02/2021. FINDINGS: 1409 hours. The heart size and mediastinal contours are stable with aortic atherosclerosis and a small hiatal hernia. The bilateral airspace opacities seen on the most recent study have resolved. The lungs are now clear. There is no pleural effusion or pneumothorax. No acute osseous findings are evident. There are degenerative changes in the spine associated with a mild thoracolumbar scoliosis. Telemetry leads overlie the chest. IMPRESSION: No evidence of acute cardiopulmonary process. Interval resolution of bilateral airspace opacities. Electronically Signed   By: Richardean Sale M.D.   On: 08/17/2022 14:18    Physical Exam: BP 118/78   Pulse (!) 45   Ht 4' 8"$  (1.422 m)   BMI 25.11 kg/m  Constitutional: Pleasant, frail, elderly Caucasian female in no acute distress.  Seated in wheelchair HEENT: Normocephalic and atraumatic. Conjunctivae are normal. No scleral icterus. Neck supple.  Cardiovascular: Normal rate, regular rhythm.  Pulmonary/chest: Effort normal and breath sounds normal. No wheezing, rales or rhonchi. Abdominal: Abdominal exam limited as it was performed with paitent in wheelchair; Soft, nondistended, nontender. Bowel sounds active throughout.  Extremities: 1+ pitting edema b/l Neurological: Alert and oriented to person place and time. Skin: Skin is warm and dry. No rashes noted. Psychiatric: Normal mood and affect. Behavior is normal.  CBC    Component Value Date/Time   WBC CANCELED 08/22/2022 1110   WBC 14.6 (H) 08/17/2022 1250   RBC CANCELED 08/22/2022 1110   RBC 2.10 (L) 08/17/2022 1250   HGB CANCELED 08/22/2022 1110   HCT CANCELED 08/22/2022 1110   PLT CANCELED 08/22/2022 1110   MCV CANCELED 08/22/2022 1110   MCH CANCELED 08/22/2022 1110   MCH 35.7 (H) 08/17/2022 1250   MCHC CANCELED 08/22/2022 1110   MCHC  31.6 08/17/2022 1250   RDW CANCELED 08/22/2022 1110   LYMPHSABS CANCELED 08/22/2022 1110   MONOABS 1.2 (H) 12/10/2021 0546   EOSABS CANCELED 08/22/2022 1110   BASOSABS CANCELED 08/22/2022 1110    CMP     Component Value Date/Time   NA 135 08/22/2022 1110   K 4.0 08/22/2022 1110   CL 99 08/22/2022 1110   CO2 17 (L) 08/22/2022 1110   GLUCOSE 104 (H) 08/22/2022 1110   GLUCOSE 145 (H) 08/17/2022 1250   BUN 10 08/22/2022 1110   CREATININE 1.29 (H) 08/22/2022 1110   CALCIUM 8.4 (L) 08/22/2022 1110   PROT 6.1 08/22/2022 1110   ALBUMIN 2.5 (L) 08/22/2022 1110   AST 23 08/22/2022 1110   ALT 20 08/22/2022 1110   ALKPHOS 224 (H) 08/22/2022 1110   BILITOT 0.5 08/22/2022 1110   GFRNONAA 50 (L) 08/17/2022 1250     ASSESSMENT AND PLAN: 72 year old female with multiple comorbidities as outlined above with several weeks of solid dysphagia.  EGD in Nov 2022 without any luminal narrowing to explain symptoms.  Seems unusual that she would develop a stricture/stenosis so quickly, especially in the absence of GERD symptoms or esophagitis.   Given the severity of her symptoms (only being able to eat 5-6 bites of food), I do think she warrants an EGD.  Because of her functional decline and dependence on assistance with transfers, her EGD will need to be done  in the hospital setting. The patient had 10 adenomas in Nov 2022, including 3 polyps > 1cm.  She is fairly high risk for colon cancer, but her functional status has declined recently; the benefits of further colon cancer screening may be outweighed by the risks.  The patient would tentatively like to proceed with completing the colonoscopy.   Will tentatively schedule patient for an EGD and colonoscopy in April Will get barium swallow now and there is significant esophageal obstruction, may see if her EGD can be done sooner.  Dyshagia - Barium swallow; if significantly abnormal, can consider asking colleagues to do expedited EGD to improve  patient's PO intake - EGD   History of numerous advanced adenomas - Colonoscopy  EGD/colonoscopy to be scheduled in the hospital setting  The details, risks (including bleeding, perforation, infection, missed lesions, medication reactions and possible hospitalization or surgery if complications occur), benefits, and alternatives to EGD/colonoscopy with possible biopsy and possible polypectomy were discussed with the patient and she consents to proceed.   Ahmani Prehn E. [name], MD [facility]    [name], [place]

## 2022-08-30 NOTE — Assessment & Plan Note (Signed)
She reports that her daughter and her husband think that it would be best for her to be admitted to a rehab center The patient is inquiring about recommendations for a rehab facility Inform the patient to research various rehab facilities and select the facility that best meets her needs Inform the patient to bring the [facility] 2 form for me to complete

## 2022-08-31 ENCOUNTER — Ambulatory Visit (HOSPITAL_COMMUNITY): Payer: Medicare PPO

## 2022-08-31 DIAGNOSIS — Z79899 Other long term (current) drug therapy: Secondary | ICD-10-CM | POA: Diagnosis not present

## 2022-08-31 DIAGNOSIS — E119 Type 2 diabetes mellitus without complications: Secondary | ICD-10-CM | POA: Diagnosis present

## 2022-08-31 DIAGNOSIS — K219 Gastro-esophageal reflux disease without esophagitis: Secondary | ICD-10-CM | POA: Diagnosis not present

## 2022-08-31 DIAGNOSIS — I5032 Chronic diastolic (congestive) heart failure: Secondary | ICD-10-CM | POA: Diagnosis present

## 2022-08-31 DIAGNOSIS — N390 Urinary tract infection, site not specified: Secondary | ICD-10-CM | POA: Diagnosis present

## 2022-08-31 DIAGNOSIS — A4151 Sepsis due to Escherichia coli [E. coli]: Secondary | ICD-10-CM | POA: Diagnosis present

## 2022-08-31 DIAGNOSIS — I700 Atherosclerosis of aorta: Secondary | ICD-10-CM | POA: Diagnosis not present

## 2022-08-31 DIAGNOSIS — Z8551 Personal history of malignant neoplasm of bladder: Secondary | ICD-10-CM | POA: Diagnosis not present

## 2022-08-31 DIAGNOSIS — Z87891 Personal history of nicotine dependence: Secondary | ICD-10-CM | POA: Diagnosis not present

## 2022-08-31 DIAGNOSIS — Z993 Dependence on wheelchair: Secondary | ICD-10-CM | POA: Diagnosis not present

## 2022-08-31 DIAGNOSIS — I509 Heart failure, unspecified: Secondary | ICD-10-CM | POA: Diagnosis not present

## 2022-08-31 DIAGNOSIS — I110 Hypertensive heart disease with heart failure: Secondary | ICD-10-CM | POA: Diagnosis present

## 2022-08-31 DIAGNOSIS — E860 Dehydration: Secondary | ICD-10-CM | POA: Diagnosis present

## 2022-08-31 DIAGNOSIS — K222 Esophageal obstruction: Secondary | ICD-10-CM | POA: Diagnosis present

## 2022-08-31 DIAGNOSIS — N3001 Acute cystitis with hematuria: Secondary | ICD-10-CM | POA: Diagnosis not present

## 2022-08-31 DIAGNOSIS — E559 Vitamin D deficiency, unspecified: Secondary | ICD-10-CM | POA: Diagnosis present

## 2022-08-31 DIAGNOSIS — Z8249 Family history of ischemic heart disease and other diseases of the circulatory system: Secondary | ICD-10-CM | POA: Diagnosis not present

## 2022-08-31 DIAGNOSIS — R531 Weakness: Secondary | ICD-10-CM | POA: Diagnosis present

## 2022-08-31 DIAGNOSIS — R109 Unspecified abdominal pain: Secondary | ICD-10-CM | POA: Diagnosis not present

## 2022-08-31 DIAGNOSIS — F419 Anxiety disorder, unspecified: Secondary | ICD-10-CM | POA: Diagnosis present

## 2022-08-31 DIAGNOSIS — M6281 Muscle weakness (generalized): Secondary | ICD-10-CM | POA: Diagnosis not present

## 2022-08-31 DIAGNOSIS — Z91041 Radiographic dye allergy status: Secondary | ICD-10-CM | POA: Diagnosis not present

## 2022-08-31 DIAGNOSIS — R6520 Severe sepsis without septic shock: Secondary | ICD-10-CM | POA: Diagnosis present

## 2022-08-31 DIAGNOSIS — R1310 Dysphagia, unspecified: Secondary | ICD-10-CM | POA: Diagnosis not present

## 2022-08-31 DIAGNOSIS — D649 Anemia, unspecified: Secondary | ICD-10-CM | POA: Diagnosis present

## 2022-08-31 DIAGNOSIS — R42 Dizziness and giddiness: Secondary | ICD-10-CM

## 2022-08-31 DIAGNOSIS — R933 Abnormal findings on diagnostic imaging of other parts of digestive tract: Secondary | ICD-10-CM | POA: Diagnosis not present

## 2022-08-31 DIAGNOSIS — K760 Fatty (change of) liver, not elsewhere classified: Secondary | ICD-10-CM | POA: Diagnosis present

## 2022-08-31 DIAGNOSIS — I10 Essential (primary) hypertension: Secondary | ICD-10-CM | POA: Diagnosis not present

## 2022-08-31 DIAGNOSIS — K224 Dyskinesia of esophagus: Secondary | ICD-10-CM | POA: Diagnosis not present

## 2022-08-31 DIAGNOSIS — Z7982 Long term (current) use of aspirin: Secondary | ICD-10-CM | POA: Diagnosis not present

## 2022-08-31 DIAGNOSIS — R000 Tachycardia, unspecified: Secondary | ICD-10-CM | POA: Diagnosis not present

## 2022-08-31 DIAGNOSIS — Z7983 Long term (current) use of bisphosphonates: Secondary | ICD-10-CM | POA: Diagnosis not present

## 2022-08-31 DIAGNOSIS — N179 Acute kidney failure, unspecified: Secondary | ICD-10-CM | POA: Diagnosis present

## 2022-08-31 DIAGNOSIS — R2689 Other abnormalities of gait and mobility: Secondary | ICD-10-CM | POA: Diagnosis not present

## 2022-08-31 DIAGNOSIS — R1319 Other dysphagia: Secondary | ICD-10-CM | POA: Diagnosis not present

## 2022-08-31 DIAGNOSIS — R319 Hematuria, unspecified: Secondary | ICD-10-CM | POA: Diagnosis not present

## 2022-08-31 DIAGNOSIS — Z1152 Encounter for screening for COVID-19: Secondary | ICD-10-CM | POA: Diagnosis not present

## 2022-08-31 DIAGNOSIS — Z833 Family history of diabetes mellitus: Secondary | ICD-10-CM | POA: Diagnosis not present

## 2022-08-31 DIAGNOSIS — A4159 Other Gram-negative sepsis: Secondary | ICD-10-CM | POA: Diagnosis present

## 2022-08-31 DIAGNOSIS — A419 Sepsis, unspecified organism: Secondary | ICD-10-CM | POA: Diagnosis not present

## 2022-08-31 DIAGNOSIS — Z882 Allergy status to sulfonamides status: Secondary | ICD-10-CM | POA: Diagnosis not present

## 2022-08-31 DIAGNOSIS — Z9884 Bariatric surgery status: Secondary | ICD-10-CM | POA: Diagnosis not present

## 2022-08-31 DIAGNOSIS — R2681 Unsteadiness on feet: Secondary | ICD-10-CM

## 2022-08-31 NOTE — Therapy (Signed)
OUTPATIENT PHYSICAL THERAPY VESTIBULAR TREATMENT     Patient Name: Heather Garrett MRN: [id number] DOB:1950-12-26, 72 y.o., female Today's Date: 08/31/2022  END OF SESSION:  PT End of Session - 08/31/22 0813     Visit Number 4    Number of Visits 6    Date for PT Re-Evaluation 09/02/22    Authorization Type [employer] Medicare Choice    Authorization Time Period auth submitted please check    PT Start Time 0815    PT Stop Time 0855    PT Time Calculation (min) 40 min    Activity Tolerance Patient tolerated treatment well    Behavior During Therapy WFL for tasks assessed/performed             Past Medical History:  Diagnosis Date   Allergy    SEASONAL   Anemia    "YEAR IN HALF AGO "   Anxiety    Arthritis    OSTEOARTHRITIS   Biceps tendinitis    Bladder cancer ([place]) 2014   Breast cancer ([place]) [place] 2 YEARS AGO UPDATED 05/03/22   Chronic diastolic CHF (congestive heart failure) (HCC)    Cobalamin deficiency    Coronary arteriosclerosis in native artery    Diabetes mellitus without complication ([place])    "YEARS AGO WHEN i WAS OVER WEIGHTBUT NOT NOW" UPDATED 05/03/22   Elevated liver function tests    GERD (gastroesophageal reflux disease)    "A LITTLE"   Heart murmur    Hiatal hernia    Hyperlipidemia    Hypertension    Migraine    Myocardial infarction ([place])    Rotator cuff syndrome    Sleep apnea    "YES BUT RESOLVED",UPDATED 05/03/22   Transitional cell carcinoma, 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[place])    Tumor    "ON KIDNEY,REMOVED NO PROBLEMS SINCE",2014,RIGHT KIDNEY   Vitamin D deficiency    Past Surgical History:  Procedure Laterality Date   BREAST SURGERY     CARDIAC CATHETERIZATION     CATARACT EXTRACTION     CHOLECYSTECTOMY  1979   DG  BONE DENSITY ([place] H[facility])     ENDOMETRIAL ABLATION     GASTRIC BYPASS     MASTECTOMY     OTHER SURGICAL HISTORY     CANCER SURGERY   TOTAL HIP ARTHROPLASTY Left 09/06/2021   Procedure: TOTAL HIP  ARTHROPLASTY;  Surgeon: Willaim Sheng, MD;  Location: [facility];  Service: Orthopedics;  Laterality: Left;   Patient Active Problem List   Diagnosis Date Noted   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   Sleep disturbance 07/06/2022   Hordeolum of left lower eyelid 06/27/2022   Positive screening for depression on 9-item Patient Health Questionnaire (PHQ-9) 06/27/2022   Nausea and vomiting 03/17/2022   Left ankle pain 02/20/2022   Acute respiratory failure with hypoxia (HCC) 12/10/2021   Severe sepsis ([place]) 12/10/2021   Multifocal pneumonia 12/10/2021   GERD (gastroesophageal reflux disease) 12/10/2021   Depression 12/10/2021   Chronic diastolic CHF (congestive heart failure) ([place]) 12/10/2021   Chronic anemia 12/10/2021   UTI (urinary tract infection) 11/28/2021   Prolonged QT interval 11/28/2021   Normocytic anemia 09/05/2021   Hyperkalemia 09/05/2021   Triggering of finger 05/17/2021   Heart disease 05/17/2021   Malignant tumor of breast ([place]) 05/17/2021   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[place]) 05/17/2021   Hypertension 05/17/2021   Lower extremity edema 05/14/2021   Benign paroxysmal positional vertigo of left ear 03/23/2021   Daytime  somnolence 03/23/2021   History of sleep apnea 03/23/2021   Orthostatic hypotension 03/23/2021   Migraine without aura and without status migrainosus, not intractable 03/23/2021   Coronary arteriosclerosis in native artery 09/28/2016   Elevated LFTs 09/28/2016   Rotator cuff syndrome 09/28/2016   Biceps tendinitis 09/28/2016   Cobalamin deficiency 09/28/2016   Hyperlipidemia 09/28/2016   Migraine 09/28/2016   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  Feels like she needs to go to a rehab center for a couple weeks.  " It's hard on me and my husband" for him to pick her up. "8-9/10" dizziness. Legs hurt 9/10.  Sore under her arms from her husband picking her up.despite putting her through assessment and treatment for BPPV she is no better; actually worse and is functionally declining.  She is asking about going to SNF for rehab for a little while to get stonger.  I told her this is a more difficult process from home versus from the hospital but she is very weak and her husband is having a hard time managing her.  both shins were bleeding from him trying to transfer her from the car.  Unable to finish her HEP from last visit; "felt like I was going to fall out of the chair" She reports she threw up last yesterday.  -States she has pressure  sores on bottom   Pt accompanied by: significant other  PERTINENT HISTORY: history of migraine; THA in March of last year; fall in June of last year with pelvis fx; WC bound  PAIN:  Are you having pain? Yes: NPRS scale: 7/10 Pain location: left side hip Pain description: sore; weak Aggravating factors: standing, movement Relieving factors: sit and rest  PRECAUTIONS: Fall  WEIGHT BEARING RESTRICTIONS: No  FALLS: Has patient fallen in last 6 months? Yes. Number of falls 1  LIVING ENVIRONMENT: Lives with: lives with their spouse Lives in: House/apartment Stairs:  unknown Has following equipment at home: Environmental consultant - 2 wheeled and Wheelchair (manual)  PLOF: Needs assistance with ADLs, Needs assistance with gait, and Needs assistance with transfers  PATIENT GOALS: not to be dizzy  OBJECTIVE:   DIAGNOSTIC FINDINGS:    CLINICAL DATA:  Left hip pain after fall in March of 2023. History of breast cancer   EXAM: CT OF THE LEFT HIP WITHOUT CONTRAST   TECHNIQUE: Multidetector CT imaging of the left hip was performed according to the standard protocol. Multiplanar CT image reconstructions were also generated.   RADIATION DOSE REDUCTION: This exam was performed according to the departmental dose-optimization program which includes automated exposure control, adjustment of the mA and/or kV according to patient size and/or use of iterative reconstruction technique.   COMPARISON:  X-ray 03/02/2022, CT 09/06/2021   FINDINGS: Bones/Joint/Cartilage   Postsurgical changes of left total hip arthroplasty. Arthroplasty components are in their expected alignment without dislocation. No periprosthetic lucency. There is an acute to subacute appearing nondisplaced periprosthetic fracture involving the intertrochanteric and proximal subtrochanteric femur (series 6, images 47-54).  No additional fractures. Healed right superior and inferior pubic rami fractures.   Ligaments   Suboptimally assessed by CT.   Muscles and Tendons   Generalized muscle atrophy, particularly affecting the gluteal musculature. No acute musculotendinous abnormality.   Soft tissues   No organized fluid collection or hematoma. No left inguinal lymphadenopathy. Extensive atherosclerotic vascular calcifications.   IMPRESSION: Acute to subacute appearing nondisplaced periprosthetic fracture involving the intertrochanteric and proximal subtrochanteric left femur.   These results will be called to the ordering clinician or representative by the Radiologist Assistant, and communication documented in the PACS or [employer].     Electronically Signed   By: Davina Poke D.O.   On: 03/19/2022 13:32   COGNITION: Overall cognitive status: Within functional limits for tasks assessed   SENSATION: Not tested  EDEMA:   Noted swelling left leg  POSTURE:  rounded shoulders, forward head, and flexed trunk  in WC  Cervical ROM:    Active AROM (deg) eval  Flexion wfl  Extension "Dizzy"  Right lateral flexion wfl  Left lateral flexion wfl  Right rotation wfl  Left rotation wfl  (Blank rows = not tested)  STRENGTH: not tested  LOWER EXTREMITY MMT: bilateral leg weakness left leg > right leg  MMT Right eval Left eval  Hip flexion    Hip abduction    Hip adduction    Hip internal rotation    Hip external rotation    Knee flexion    Knee extension    Ankle dorsiflexion    Ankle plantarflexion    Ankle inversion    Ankle eversion    (Blank rows = not tested)  BED MOBILITY:  Sit to supine Min A Supine to sit Min A Rolling to Left Min A  TRANSFERS: Assistive device utilized: Wheelchair (manual)  Sit to stand: Min A Stand to sit: Min A Chair to chair: Min A GAIT: Gait pattern: decreased step length- Right and decreased step length- Left Distance walked: 2 ft Assistive device utilized:  PT hand held assist and None Level of assistance: Min A Comments: patient at risk for falls  FUNCTIONAL TESTS:  30 seconds chair stand test x 0  PATIENT SURVEYS:  DHI 66  (severe handicap)  VESTIBULAR ASSESSMENT:  GENERAL OBSERVATION: glasses   SYMPTOM BEHAVIOR:  Subjective history: see above  Non-Vestibular symptoms: migraine symptoms  Type of dizziness: Spinning/Vertigo  Frequency: varies  Duration: brief  Aggravating factors: Induced by position change: supine to sit and Induced by motion: turning body quickly and turning head quickly  Relieving factors: medication and closing eyes  Progression of symptoms: worse  OCULOMOTOR EXAM:  Ocular Alignment: normal  Ocular ROM: No Limitations  Spontaneous Nystagmus: absent  Gaze-Induced Nystagmus: absent  Smooth Pursuits: intact  Saccades:  not tested  Convergence/Divergence: not measured cm     VESTIBULAR - OCULAR REFLEX: not  tested    POSITIONAL TESTING: Right Dix-Hallpike: upbeating, right nystagmus  MOTION SENSITIVITY:  Motion Sensitivity Quotient Intensity: 0 = none, 1 = Lightheaded, 2 = Mild, 3 = Moderate, 4 = Severe, 5 = Vomiting  Intensity-eval 08/15/22 08/25/2022  1. Sitting to supine 1    2. Supine to L side 3    3. Supine to R side     4. Supine to sitting 2 2   5. L Hallpike-Dix  3   6. Up from L      7. R Hallpike-Dix 3 2   8. Up from R  9. Sitting, head tipped to L knee     10. Head up from L knee     11. Sitting, head tipped to R knee     12. Head up from R knee     13. Sitting head turns x5   5  14.Sitting head nods x5     15. In stance, 180 turn to L      16. In stance, 180 turn to R       OTHOSTATICS: not done  FUNCTIONAL GAIT: 30 seconds chair stand test 0   VESTIBULAR TREATMENT:                                                                                                   DATE:  08/31/2022 Vitals check O2 saturation 88%-91% BP 124/82 HR 100  Sitting: Head turns gaze stabilization x 10 Head nods gaze stabilization x 10 Heel/toe raises x 10 LAQs x 8 March x 5 each    08/25/2022 Sititng head turns x 5 B pt becomes nauseated and vomits. Dix HallPike (+) to RT with Eply x 2 with good results  Marye Round  (+) to Lt with Epley x2  with good results  Seated Ankle Pumps  -x5  Seated Long Arc Quad  x5  - Seated March  x5  - Seated Hip Abduction/adduction x 5   08/15/2022 Review of goals DHP to right and left Epley maneuver for left posterior canal x 2  08/12/2022 physical therapy evaluation  Canalith Repositioning:  Epley Right: Number of Reps: 1 and Response to Treatment: comment: dizziness with turn to the left; no dizziness on sitting but nauseated  PATIENT EDUCATION: 08/31/22 gaze stabilization head nods and turns  08/25/2022 Access Code: [email or web address] URL: [email or web address] Date: 08/25/2022 Prepared by: Rayetta Humphrey  Exercises -  Seated Ankle Pumps  - 2 x daily - 7 x weekly - 3 sets - 10 reps - 2-3" hold - Seated Long Arc Quad  - 2 x daily - 7 x weekly - 3 sets - 10 reps - 2-3" hold - Seated March  - 2 x daily - 7 x weekly - 3 sets - 10 reps - 2-3" hold - Seated Hip Abduction  - 2 x daily - 7 x weekly - 3 sets - 10 reps - 2-3" hold Eval:  Education details: Patient educated on exam findings, POC, scope of PT, HEP, and what to expect next visit. Person educated: Patient Education method: Explanation, Demonstration, and Handouts Education comprehension: verbalized understanding, returned demonstration, verbal cues required, and tactile cues required   HOME EXERCISE PROGRAM:  GOALS: Goals reviewed with patient? Yes  SHORT TERM GOALS: Target date: 08/26/2022  Patient will self report 50% improvement to improve tolerance for functional activity  Baseline: Goal status: IN PROGRESS     LONG TERM GOALS: Target date: 09/02/2022  Patient will improve DHI score ( decrease by 20 points) to demonstrate decreased dizziness with improved functional mobility  Baseline: 66 Goal status: IN PROGRESS  2.  Patient will be free of falls Baseline: had a fall this past weekend 08/15/22  Goal status: IN PROGRESS  3.  Patient will have a negative DHP to the right to improve ability to sleep and get up from sleeping without dizziness Baseline:  Goal status: IN PROGRESS  4.  Patient will self report 75% improvement to improve tolerance for functional activity  Baseline:  Goal status: IN PROGRESS    ASSESSMENT:  CLINICAL IMPRESSION: despite putting her through assessment and treatment for BPPV she is no better; actually worse and is functionally declining.  She is asking about going to SNF for rehab for a little while to get stonger.  I told her this is a more difficult process from home versus from the hospital but she is very weak and her husband is having a hard time managing her.  both shins were bleeding from him  trying to transfer her from the car today. Patient generally looks unwell although her vitals are wnl.  Patient sits in her WC slumped.  Having difficulty keeping her eyes open and focused today and has complaints of dizziness at all times; slightly increased with gaze stabilization exercises. Discussed with patient and her husband called MD to request placement versus returning to ED if symptoms persist or worsen or trying to go to our vestibular clinic in [place].  They verbalize understanding of options.  Also is they return here next visit we will reassess. Updated HEP today.    OBJECTIVE IMPAIRMENTS: Abnormal gait, decreased activity tolerance, decreased balance, decreased endurance, decreased mobility, difficulty walking, decreased ROM, decreased strength, dizziness, hypomobility, increased fascial restrictions, impaired perceived functional ability, increased muscle spasms, impaired flexibility, and pain.   ACTIVITY LIMITATIONS: carrying, lifting, bending, sitting, standing, squatting, sleeping, stairs, transfers, bed mobility, locomotion level, and caring for others  PARTICIPATION LIMITATIONS: meal prep, cleaning, laundry, driving, shopping, community activity, and yard work  [employer] POTENTIAL: Good  CLINICAL DECISION MAKING: Evolving/moderate complexity  EVALUATION COMPLEXITY: Moderate   PLAN:  PT FREQUENCY: 2x/week  PT DURATION: 3 weeks  PLANNED INTERVENTIONS: Therapeutic exercises, Therapeutic activity, Neuromuscular re-education, Balance training, Gait training, Patient/Family education, Joint manipulation, Joint mobilization, Stair training, Orthotic/Fit training, DME instructions, Aquatic Therapy, Dry Needling, Electrical stimulation, Spinal manipulation, Spinal mobilization, Cryotherapy, Moist heat, Compression bandaging, scar mobilization, Splintting, Taping, Traction, Ultrasound, Ionotophoresis 94m/ml Dexamethasone, and Manual therapy   PLAN FOR NEXT SESSION: reassess next  visit   10:17 AM, 08/31/22 Hakeen Shipes Small Jaysion Ramseyer MPT [place] [facility] [place] [phone]:[phone]

## 2022-09-01 ENCOUNTER — Emergency Department (HOSPITAL_COMMUNITY): Payer: Medicare PPO

## 2022-09-01 ENCOUNTER — Encounter (HOSPITAL_COMMUNITY): Payer: Self-pay

## 2022-09-01 ENCOUNTER — Other Ambulatory Visit: Payer: Self-pay

## 2022-09-01 ENCOUNTER — Inpatient Hospital Stay (HOSPITAL_COMMUNITY)
Admission: EM | Admit: 2022-09-01 | Discharge: 2022-09-07 | DRG: 872 | Disposition: A | Discharge status: Skilled Nursing Facility | Payer: Medicare PPO | Attending: Family Medicine | Admitting: Family Medicine

## 2022-09-01 DIAGNOSIS — Z7983 Long term (current) use of bisphosphonates: Secondary | ICD-10-CM

## 2022-09-01 DIAGNOSIS — I110 Hypertensive heart disease with heart failure: Secondary | ICD-10-CM | POA: Diagnosis present

## 2022-09-01 DIAGNOSIS — R6520 Severe sepsis without septic shock: Secondary | ICD-10-CM

## 2022-09-01 DIAGNOSIS — E559 Vitamin D deficiency, unspecified: Secondary | ICD-10-CM | POA: Diagnosis present

## 2022-09-01 DIAGNOSIS — Z833 Family history of diabetes mellitus: Secondary | ICD-10-CM | POA: Diagnosis not present

## 2022-09-01 DIAGNOSIS — A419 Sepsis, unspecified organism: Secondary | ICD-10-CM | POA: Diagnosis present

## 2022-09-01 DIAGNOSIS — Z91041 Radiographic dye allergy status: Secondary | ICD-10-CM

## 2022-09-01 DIAGNOSIS — R1319 Other dysphagia: Secondary | ICD-10-CM | POA: Diagnosis not present

## 2022-09-01 DIAGNOSIS — Z1152 Encounter for screening for COVID-19: Secondary | ICD-10-CM | POA: Diagnosis not present

## 2022-09-01 DIAGNOSIS — Z7982 Long term (current) use of aspirin: Secondary | ICD-10-CM

## 2022-09-01 DIAGNOSIS — R531 Weakness: Secondary | ICD-10-CM

## 2022-09-01 DIAGNOSIS — Z882 Allergy status to sulfonamides status: Secondary | ICD-10-CM | POA: Diagnosis not present

## 2022-09-01 DIAGNOSIS — Z87891 Personal history of nicotine dependence: Secondary | ICD-10-CM

## 2022-09-01 DIAGNOSIS — Z96642 Presence of left artificial hip joint: Secondary | ICD-10-CM | POA: Diagnosis present

## 2022-09-01 DIAGNOSIS — Z8249 Family history of ischemic heart disease and other diseases of the circulatory system: Secondary | ICD-10-CM | POA: Diagnosis not present

## 2022-09-01 DIAGNOSIS — Z9049 Acquired absence of other specified parts of digestive tract: Secondary | ICD-10-CM

## 2022-09-01 DIAGNOSIS — Z79899 Other long term (current) drug therapy: Secondary | ICD-10-CM

## 2022-09-01 DIAGNOSIS — Z993 Dependence on wheelchair: Secondary | ICD-10-CM | POA: Diagnosis not present

## 2022-09-01 DIAGNOSIS — Z9221 Personal history of antineoplastic chemotherapy: Secondary | ICD-10-CM

## 2022-09-01 DIAGNOSIS — N179 Acute kidney failure, unspecified: Secondary | ICD-10-CM | POA: Diagnosis present

## 2022-09-01 DIAGNOSIS — D649 Anemia, unspecified: Secondary | ICD-10-CM | POA: Diagnosis present

## 2022-09-01 DIAGNOSIS — F419 Anxiety disorder, unspecified: Secondary | ICD-10-CM | POA: Diagnosis present

## 2022-09-01 DIAGNOSIS — A4159 Other Gram-negative sepsis: Secondary | ICD-10-CM | POA: Diagnosis present

## 2022-09-01 DIAGNOSIS — G43009 Migraine without aura, not intractable, without status migrainosus: Secondary | ICD-10-CM | POA: Diagnosis present

## 2022-09-01 DIAGNOSIS — Z881 Allergy status to other antibiotic agents status: Secondary | ICD-10-CM

## 2022-09-01 DIAGNOSIS — R1310 Dysphagia, unspecified: Secondary | ICD-10-CM

## 2022-09-01 DIAGNOSIS — I5032 Chronic diastolic (congestive) heart failure: Secondary | ICD-10-CM | POA: Diagnosis present

## 2022-09-01 DIAGNOSIS — Z803 Family history of malignant neoplasm of breast: Secondary | ICD-10-CM

## 2022-09-01 DIAGNOSIS — C679 Malignant neoplasm of bladder, unspecified: Secondary | ICD-10-CM | POA: Diagnosis present

## 2022-09-01 DIAGNOSIS — E119 Type 2 diabetes mellitus without complications: Secondary | ICD-10-CM | POA: Diagnosis present

## 2022-09-01 DIAGNOSIS — E860 Dehydration: Secondary | ICD-10-CM | POA: Diagnosis present

## 2022-09-01 DIAGNOSIS — Z9884 Bariatric surgery status: Secondary | ICD-10-CM

## 2022-09-01 DIAGNOSIS — A4151 Sepsis due to Escherichia coli [E. coli]: Secondary | ICD-10-CM | POA: Diagnosis present

## 2022-09-01 DIAGNOSIS — Z7984 Long term (current) use of oral hypoglycemic drugs: Secondary | ICD-10-CM

## 2022-09-01 DIAGNOSIS — K760 Fatty (change of) liver, not elsewhere classified: Secondary | ICD-10-CM | POA: Diagnosis present

## 2022-09-01 DIAGNOSIS — Z8551 Personal history of malignant neoplasm of bladder: Secondary | ICD-10-CM | POA: Diagnosis not present

## 2022-09-01 DIAGNOSIS — K222 Esophageal obstruction: Secondary | ICD-10-CM | POA: Diagnosis present

## 2022-09-01 DIAGNOSIS — N390 Urinary tract infection, site not specified: Secondary | ICD-10-CM | POA: Diagnosis present

## 2022-09-01 DIAGNOSIS — I10 Essential (primary) hypertension: Secondary | ICD-10-CM | POA: Diagnosis not present

## 2022-09-01 DIAGNOSIS — I252 Old myocardial infarction: Secondary | ICD-10-CM

## 2022-09-01 DIAGNOSIS — R319 Hematuria, unspecified: Secondary | ICD-10-CM | POA: Diagnosis not present

## 2022-09-01 DIAGNOSIS — N3001 Acute cystitis with hematuria: Principal | ICD-10-CM

## 2022-09-01 DIAGNOSIS — Z853 Personal history of malignant neoplasm of breast: Secondary | ICD-10-CM

## 2022-09-01 DIAGNOSIS — R933 Abnormal findings on diagnostic imaging of other parts of digestive tract: Secondary | ICD-10-CM | POA: Diagnosis not present

## 2022-09-01 DIAGNOSIS — K219 Gastro-esophageal reflux disease without esophagitis: Secondary | ICD-10-CM | POA: Diagnosis not present

## 2022-09-01 DIAGNOSIS — Z888 Allergy status to other drugs, medicaments and biological substances status: Secondary | ICD-10-CM

## 2022-09-01 LAB — URINALYSIS, ROUTINE W REFLEX MICROSCOPIC
Bilirubin Urine: NEGATIVE
Glucose, UA: NEGATIVE mg/dL
Ketones, ur: NEGATIVE mg/dL
Nitrite: POSITIVE — AB
Protein, ur: 100 mg/dL — AB
Specific Gravity, Urine: 1.014 (ref 1.005–1.030)
Trans Epithel, UA: 1
WBC, UA: >50 WBC/hpf (ref 0–5)
pH: 5 (ref 5.0–8.0)

## 2022-09-01 LAB — CBC
HCT: 31.4 % — ABNORMAL LOW (ref 36.0–46.0)
Hemoglobin: 10.1 g/dL — ABNORMAL LOW (ref 12.0–15.0)
MCH: 35.3 pg — ABNORMAL HIGH (ref 26.0–34.0)
MCHC: 32.2 g/dL (ref 30.0–36.0)
MCV: 109.8 fL — ABNORMAL HIGH (ref 80.0–100.0)
Platelets: 223 10*3/uL (ref 150–400)
RBC: 2.86 MIL/uL — ABNORMAL LOW (ref 3.87–5.11)
RDW: 17.3 % — ABNORMAL HIGH (ref 11.5–15.5)
WBC: 18.7 10*3/uL — ABNORMAL HIGH (ref 4.0–10.5)
nRBC: 0 % (ref 0.0–0.2)

## 2022-09-01 LAB — BASIC METABOLIC PANEL
Anion gap: 13 (ref 5–15)
BUN: 20 mg/dL (ref 8–23)
CO2: 21 mmol/L — ABNORMAL LOW (ref 22–32)
Calcium: 7.5 mg/dL — ABNORMAL LOW (ref 8.9–10.3)
Chloride: 97 mmol/L — ABNORMAL LOW (ref 98–111)
Creatinine, Ser: 1.67 mg/dL — ABNORMAL HIGH (ref 0.44–1.00)
GFR, Estimated: 32 mL/min — ABNORMAL LOW (ref >60)
Glucose, Bld: 101 mg/dL — ABNORMAL HIGH (ref 70–99)
Potassium: 4.7 mmol/L (ref 3.5–5.1)
Sodium: 131 mmol/L — ABNORMAL LOW (ref 135–145)

## 2022-09-01 LAB — TROPONIN I (HIGH SENSITIVITY)
Troponin I (High Sensitivity): 22 ng/L — ABNORMAL HIGH (ref <18)
Troponin I (High Sensitivity): 23 ng/L — ABNORMAL HIGH (ref <18)

## 2022-09-01 LAB — RESP PANEL BY RT-PCR (RSV, FLU A&B, COVID)  RVPGX2
Influenza A by PCR: NEGATIVE
Influenza B by PCR: NEGATIVE
Resp Syncytial Virus by PCR: NEGATIVE
SARS Coronavirus 2 by RT PCR: NEGATIVE

## 2022-09-01 LAB — LACTIC ACID, PLASMA: Lactic Acid, Venous: 1.9 mmol/L (ref 0.5–1.9)

## 2022-09-01 MED ORDER — TRAMADOL HCL 50 MG PO TABS
50.0000 mg | ORAL_TABLET | Freq: Three times a day (TID) | ORAL | Status: DC | PRN
  Administered 2022-09-02 – 2022-09-06 (×3): 50 mg via ORAL
  Filled 2022-09-01 (×3): qty 1

## 2022-09-01 MED ORDER — CARVEDILOL 3.125 MG PO TABS
3.1250 mg | ORAL_TABLET | Freq: Two times a day (BID) | ORAL | Status: DC
  Administered 2022-09-02 (×3): 3.125 mg via ORAL
  Filled 2022-09-01 (×3): qty 1

## 2022-09-01 MED ORDER — PANTOPRAZOLE SODIUM 40 MG PO TBEC
40.0000 mg | DELAYED_RELEASE_TABLET | Freq: Every day | ORAL | Status: DC
  Administered 2022-09-02 – 2022-09-06 (×5): 40 mg via ORAL
  Filled 2022-09-01 (×5): qty 1

## 2022-09-01 MED ORDER — SODIUM CHLORIDE 0.9 % IV SOLN
2.0000 g | INTRAVENOUS | Status: DC
  Administered 2022-09-01 – 2022-09-06 (×6): 2 g via INTRAVENOUS
  Filled 2022-09-01 (×7): qty 20

## 2022-09-01 MED ORDER — LACTATED RINGERS IV SOLN: INTRAVENOUS | Status: DC

## 2022-09-01 MED ORDER — ACETAMINOPHEN 650 MG RE SUPP: 650.0000 mg | Freq: Four times a day (QID) | RECTAL | Status: DC | PRN

## 2022-09-01 MED ORDER — HEPARIN SODIUM (PORCINE) 5000 UNIT/ML IJ SOLN
5000.0000 [IU] | Freq: Three times a day (TID) | INTRAMUSCULAR | Status: DC
  Administered 2022-09-02 – 2022-09-05 (×12): 5000 [IU] via SUBCUTANEOUS
  Filled 2022-09-01 (×12): qty 1

## 2022-09-01 MED ORDER — ONDANSETRON HCL 4 MG/2ML IJ SOLN
4.0000 mg | Freq: Three times a day (TID) | INTRAMUSCULAR | Status: DC | PRN
  Administered 2022-09-05: 4 mg via INTRAVENOUS
  Filled 2022-09-01: qty 2

## 2022-09-01 MED ORDER — POLYETHYLENE GLYCOL 3350 17 G PO PACK: 17.0000 g | PACK | Freq: Every day | ORAL | Status: DC | PRN

## 2022-09-01 MED ORDER — ACETAMINOPHEN 325 MG PO TABS
650.0000 mg | ORAL_TABLET | Freq: Four times a day (QID) | ORAL | Status: DC | PRN
  Administered 2022-09-05 – 2022-09-06 (×2): 650 mg via ORAL
  Filled 2022-09-01 (×2): qty 2

## 2022-09-01 MED ORDER — ASPIRIN 81 MG PO TBEC
81.0000 mg | DELAYED_RELEASE_TABLET | Freq: Every day | ORAL | Status: DC
  Administered 2022-09-02 – 2022-09-07 (×5): 81 mg via ORAL
  Filled 2022-09-01 (×6): qty 1

## 2022-09-01 MED ORDER — ONDANSETRON HCL 4 MG PO TABS: 4.0000 mg | ORAL_TABLET | Freq: Three times a day (TID) | ORAL | Status: DC | PRN

## 2022-09-01 MED ORDER — GABAPENTIN 100 MG PO CAPS
100.0000 mg | ORAL_CAPSULE | Freq: Three times a day (TID) | ORAL | Status: DC
  Administered 2022-09-02 – 2022-09-07 (×17): 100 mg via ORAL
  Filled 2022-09-01 (×17): qty 1

## 2022-09-01 MED ORDER — SODIUM CHLORIDE 0.9 % IV BOLUS
500.0000 mL | Freq: Once | INTRAVENOUS | Status: AC
  Administered 2022-09-01: 500 mL via INTRAVENOUS

## 2022-09-01 MED ORDER — LACTATED RINGERS IV BOLUS (SEPSIS)
500.0000 mL | Freq: Once | INTRAVENOUS | Status: AC
  Administered 2022-09-01: 500 mL via INTRAVENOUS

## 2022-09-01 MED ORDER — SODIUM CHLORIDE 0.9 % IV SOLN: INTRAVENOUS | Status: AC

## 2022-09-01 NOTE — Assessment & Plan Note (Signed)
Generalized weakness progressive over the past 6 months.  Now with severe sepsis from UTI.  Family considering rehab. -PT evaluation prior to discharge.

## 2022-09-01 NOTE — ED Notes (Signed)
Lab called, trop that was drawn did not have enough blood in tube to run the test, lab says they will put the order in again and send phlebotomist to recollect. Dr Rogene Houston made aware.

## 2022-09-01 NOTE — Assessment & Plan Note (Signed)
Creatinine elevated at 1.67, over the past 2 weeks creatinine is gradually increased from baseline of 0.7-0.8. -Hydrate

## 2022-09-01 NOTE — ED Provider Notes (Signed)
[facility] Provider Note   CSN: [id number] Arrival date & time: 09/01/22  1357     History  Chief Complaint  Patient presents with   Extremity Weakness    NELY FURROW is a 72 y.o. female.   Extremity Weakness Associated symptoms include abdominal pain. Pertinent negatives include no headaches and no shortness of breath.       ALBIRDA PAULY is a 72 y.o. female type 2 diabetes, hypertension, history of bladder cancer currently in remission, CHF, anemia, anxiety who presents to the Emergency Department from home and here for evaluation of generalized weakness, dizziness, and difficulty walking or standing.  States symptoms have been gradually worsening for some time, worsening for several days.  She describes "a room spinning" sensation that worsens with movement and position change.  She reports some right-sided lower abdominal pain as well.  Decreased appetite states she is not drinking much fluids.  No known fever or chills.  No cough, vomiting or diarrhea.  Patient spouse at bedside, states he has to help her from bed or sofa to wheelchair as she cannot stand.  He is requesting possible SNF placement if she does not meet hospital admission criteria.   She had PT yesterday   Home Medications Prior to Admission medications   Medication Sig Start Date End Date Taking? Authorizing Provider  alendronate (FOSAMAX) 70 MG tablet 70 mg once a week. 04/19/22   [provider]  aspirin 81 MG EC tablet Take 1 tablet (81 mg total) by mouth every other day. Swallow whole.  Hold aspirin while taking eliquis, Patient taking differently: Take 81 mg by mouth daily. Swallow whole.  Hold aspirin while taking eliquis, 09/10/21   Florencia Reasons, MD  Atogepant (QULIPTA) 60 MG TABS Take 1 tablet (60 mg total) by mouth daily. 08/11/22   Frann Rider, NP  atorvastatin (LIPITOR) 40 MG tablet Take 40 mg by mouth daily. 05/20/22   [provider]  carvedilol (COREG) 3.125 MG tablet Take 1 tablet (3.125 mg total) by mouth 2 (two) times daily. Hold if sbp less than 100 or heart rate less than 50 06/09/22   Pemberton, Greer Ee, MD  celecoxib (CELEBREX) 100 MG capsule Take 1 capsule by mouth twice a day with meals Patient taking differently: Take 100 mg by mouth 2 (two) times daily. 06/09/22   [name], FNP  cephALEXin (KEFLEX) 500 MG capsule Take 500 mg by mouth 2 (two) times daily.    [provider]  ezetimibe (ZETIA) 10 MG tablet Take 1 tablet (10 mg total) by mouth daily. 05/20/22   [name], FNP  famotidine (PEPCID) 40 MG tablet Take 1 tablet (40 mg total) by mouth as needed. 03/01/22   [name], FNP  ferrous sulfate 325 (65 FE) MG EC tablet Take 1 tablet (325 mg total) by mouth every Monday, Wednesday, and Friday. Patient taking differently: Take 325 mg by mouth every Monday, Wednesday, and Friday. Pt taking once a week 09/10/21   Florencia Reasons, MD  folic acid (FOLVITE) 1 MG tablet Take 1 tablet (1 mg total) by mouth daily. 12/03/21 12/03/22  Florencia Reasons, MD  gabapentin (NEURONTIN) 100 MG capsule Take 1 capsule (100 mg total) by mouth 3 (three) times daily. 08/22/22   Lyndal Pulley, MD  lidocaine (XYLOCAINE) 2 % solution Use as directed 5 mLs in the mouth or throat every 3 (three) hours as needed for mouth pain. 08/16/22   Merrie Roof  Benjamine Mola, PA-C  meclizine (ANTIVERT) 12.5 MG tablet Take 1 tablet (12.5 mg total) by mouth 3 (three) times daily as needed for dizziness. 08/17/22   Lourdes Sledge A, PA-C  Na Sulfate-K Sulfate-Mg Sulf (SUPREP BOWEL PREP KIT) 17.5-3.13-1.6 GM/177ML SOLN Take 1 kit by mouth as directed. For colonoscopy prep 08/30/22   Daryel November, MD  nystatin-triamcinolone ointment [facility]) 1 Topical Daily 04/19/22   [provider]  pantoprazole (PROTONIX) 40 MG tablet Take 1 tablet (40 mg total) by mouth 2 (two) times daily before a meal. Patient taking differently: Take 40 mg  by mouth daily. 06/07/22   [name], FNP  Polyethylene Glycol 400 (BLINK TEARS) 0.25 % SOLN Place 1 drop into both eyes daily as needed (dry eyes).    [provider]  sertraline (ZOLOFT) 100 MG tablet Take 1 tablet (100 mg total) by mouth daily. 03/11/22   [name], FNP  simvastatin (ZOCOR) 20 MG tablet Take 1 tablet (20 mg total) by mouth at bedtime. 05/20/22   [name], FNP  tiZANidine (ZANAFLEX) 4 MG tablet Take 1 tablet (4 mg total) by mouth at bedtime. 03/21/22   [name], FNP  trimethobenzamide (TIGAN) 300 MG capsule Take 1 capsule (300 mg total) by mouth 3 (three) times daily. 03/17/22   Paseda, Dewaine Conger, FNP  Ubrogepant (UBRELVY) 100 MG TABS Take 100 mg by mouth daily as needed (migraine headaches). Can repeat x1 after 2 hours if needed. Max dose '200mg'$ /24 hours 05/02/22   Frann Rider, NP      Allergies    Contrast media [iodinated contrast media], Ciprofloxacin, Erythromycin base, and Sulfabenzamide    Review of Systems   Review of Systems  Constitutional:  Positive for appetite change. Negative for chills and fever.  HENT:  Negative for trouble swallowing.   Respiratory:  Negative for cough and shortness of breath.   Gastrointestinal:  Positive for abdominal pain. Negative for diarrhea, nausea and vomiting.  Genitourinary:  Positive for decreased urine volume and difficulty urinating.  Musculoskeletal:  Positive for extremity weakness. Negative for arthralgias and back pain.  Skin:  Negative for wound.  Neurological:  Positive for dizziness and weakness. Negative for seizures, syncope, facial asymmetry, speech difficulty, numbness and headaches.    Physical Exam Updated Vital Signs BP (!) 148/90 (BP Location: Left Arm)   Pulse (!) 110   Temp 97.6 F (36.4 C) (Oral)   Resp 12   Ht '4\' 8"'$  (1.422 m)   Wt 50.8 kg   SpO2 96%   BMI 25.11 kg/m  Physical Exam Vitals and nursing note reviewed.  Constitutional:      Appearance: Normal  appearance.  HENT:     Mouth/Throat:     Mouth: Mucous membranes are dry.  Eyes:     Extraocular Movements: Extraocular movements intact.     Conjunctiva/sclera: Conjunctivae normal.     Pupils: Pupils are equal, round, and reactive to light.  Cardiovascular:     Rate and Rhythm: Normal rate and regular rhythm.     Pulses: Normal pulses.  Pulmonary:     Effort: Pulmonary effort is normal. No respiratory distress.  Abdominal:     General: There is no distension.     Palpations: Abdomen is soft.     Tenderness: There is abdominal tenderness. There is no guarding.  Musculoskeletal:        General: Tenderness present.     Right lower leg: Edema present.     Left lower leg: Edema present.  Skin:    General: Skin is warm.     Capillary Refill: Capillary refill takes less than 2 seconds.     Findings: Bruising present.     Comments: Scattered areas of ecchymosis to the bilateral lower extremities.  Some open wounds noted, areas are bandaged.  Neurological:     General: No focal deficit present.     Mental Status: She is alert and oriented to person, place, and time.     GCS: GCS eye subscore is 4. GCS verbal subscore is 5. GCS motor subscore is 6.     Sensory: Sensation is intact.     Motor: Motor function is intact.     Comments: CN II through XII grossly intact.  Speech clear.  No facial droop.  No unilateral extremity weakness, or pronator drift     ED Results / Procedures / Treatments   Labs (all labs ordered are listed, but only abnormal results are displayed) Labs Reviewed  BASIC METABOLIC PANEL - Abnormal; Notable for the following components:      Result Value   Sodium 131 (*)    Chloride 97 (*)    CO2 21 (*)    Glucose, Bld 101 (*)    Creatinine, Ser 1.67 (*)    Calcium 7.5 (*)    GFR, Estimated 32 (*)    All other components within normal limits  CBC - Abnormal; Notable for the following components:   WBC 18.7 (*)    RBC 2.86 (*)    Hemoglobin 10.1 (*)    HCT  31.4 (*)    MCV 109.8 (*)    MCH 35.3 (*)    RDW 17.3 (*)    All other components within normal limits  TROPONIN I (HIGH SENSITIVITY) - Abnormal; Notable for the following components:   Troponin I (High Sensitivity) 22 (*)    All other components within normal limits  RESP PANEL BY RT-PCR (RSV, FLU A&B, COVID)  [id number]  URINALYSIS, ROUTINE W REFLEX MICROSCOPIC  CBG MONITORING, ED  TROPONIN I (HIGH SENSITIVITY)    EKG None  Radiology CT ABDOMEN PELVIS WO CONTRAST  Result Date: 09/01/2022 CLINICAL DATA:  Right lower quadrant pain EXAM: CT ABDOMEN AND PELVIS WITHOUT CONTRAST TECHNIQUE: Multidetector CT imaging of the abdomen and pelvis was performed following the standard protocol without IV contrast. RADIATION DOSE REDUCTION: This exam was performed according to the departmental dose-optimization program which includes automated exposure control, adjustment of the mA and/or kV according to patient size and/or use of iterative reconstruction technique. COMPARISON:  01/26/2021 FINDINGS: Lower chest: Small hiatal hernia, stable.  No acute abnormality Hepatobiliary: Prior cholecystectomy. Severe diffuse fatty infiltration of the liver. No focal suspicious hepatic abnormality. Pancreas: No focal abnormality or ductal dilatation. Spleen: No focal abnormality.  Normal size. Adrenals/Urinary Tract: No renal or adrenal mass. No stones or hydronephrosis. Urinary bladder unremarkable. Stomach/Bowel: Postoperative changes in the stomach and small bowel. Stomach, large and small bowel grossly unremarkable. Vascular/Lymphatic: Diffuse aortic atherosclerosis. No evidence of aneurysm or adenopathy. Reproductive: Uterus and adnexa unremarkable.  No mass. Other: Trace free fluid in the cul-de-sac.  No free air. Musculoskeletal: Prior left hip replacement. No acute bony abnormality. Degenerative changes in the lumbar spine. IMPRESSION: Severe diffuse fatty infiltration of the liver. Diffuse aortic atherosclerosis.  Trace free fluid in the cul-de-sac. Small hiatal hernia. Electronically Signed   By: Rolm Baptise M.D.   On: 09/01/2022 18:06    Procedures Procedures    Medications Ordered in ED Medications  sodium chloride 0.9 % bolus 500 mL (0 mLs Intravenous Stopped 09/01/22 1808)    ED Course/ Medical Decision Making/ A&P                             Medical Decision Making Patient here from home for complaints of dizziness that has been present for some time.  She describes a spinning sensation that worsens with movement.  She also complains of worsening generalized weakness and difficulty with standing or walking.  States her husband is having to transfer her from bed to sofa to wheelchair as she is unable to stand.  She had PT yesterday.  Patient and spouse are requesting SNF placement if she does not qualify for hospital admission.  Initially, vitals reassuring. 98 F (36.7 C) Oral Resp 16 Pul ox 98 % Room Air  Heart rate 88  BP 121/87 her mucous membranes are dry but she is nontoxic-appearing.  Clinically, she appears dehydrated.  PNA, sepsis, acute abdomen, acute neurological process all considered in differential.  She does have history of CHF so will give IV fluids moderately, will obtain labs and since she is having right lower quadrant pain reasonable to order CT abdomen and pelvis.   Amount and/or Complexity of Data Reviewed Labs: ordered.    Details: Labs interpreted by me, patient has significant leukocytosis with white count of 18,700.  Hemoglobin 10.1 improved from 2 weeks ago, chemistries show slight elevation in serum creatinine 1.29 10 days ago, today 1.67, respiratory panel negative.  Initial troponin elevated at 22 but improved from patient's baseline, delta troponin pending.  Urinalysis positive for nitrites, large leukocytes, greater than 50 WBC and many bacteria.  Urine culture is pending. Radiology: ordered.    Details: CT abdomen and pelvis ordered for evaluation of right lower  quadrant pain.  No acute intra-abdominal or pelvic findings. ECG/medicine tests: ordered.    Details: EKG shows sinus tachycardia Discussion of management or test interpretation with external provider(s): On recheck, patient remains afebrile but now tachycardic.  Given IV fluids initially, with elevated white count and now tachycardic raises concern for evolving sepsis.  Sepsis order set initiated with IV Rocephin ordered as likely urine as source.  Additional 500 cc fluids ordered for now as lactic acid is pending.  Patient will likely need hospital admission.  Discussed findings with Evalee Jefferson, PA-C who agrees to review pending lab results and arrange for likely hospital admission.           Final Clinical Impression(s) / ED Diagnoses Final diagnoses:  None    Rx / DC Orders ED Discharge Orders     None         Kem Parkinson, PA-C 09/01/22 1952    Fredia Sorrow, MD 09/04/22 1358

## 2022-09-01 NOTE — Assessment & Plan Note (Signed)
Blood pressure stable. -Resume carvedilol 3.125 mg twice daily

## 2022-09-01 NOTE — Assessment & Plan Note (Signed)
Home atogepant for migraine prophylaxis held for now, with renal insufficiency.

## 2022-09-01 NOTE — Assessment & Plan Note (Addendum)
Severe sepsis secondary to UTI.  Meeting criteria with tachycardia heart rate 88-110, leukocytosis of 18.7.  With evidence of endorgan dysfunction AKI.  Lactic acid 1.9.  UA suggestive of UTI with nitrites and large leukocytes many bacteria.  Chest x-ray clear.  CT abdomen and pelvis shows fatty liver without acute abnormality. -IV ceftriaxone 2 g daily -1 Liter bolus given, continue N/s 75cc/hr x 12hrs -Obtain blood cultures - Add- on urine cultures -PT prior to discharge.

## 2022-09-01 NOTE — ED Notes (Signed)
Pt returned from CT °

## 2022-09-01 NOTE — Assessment & Plan Note (Addendum)
Acute on chronic bilateral lower extremity edema, which could be from poor ambulation, and venous stasis, otherwise with a stable white opacity, and chest x-ray is clear.  Last echo 05/2022 EF of 55 to 60% with grade 1 DD.  Not on diuretics. - At this time she is dehydrated, with severe sepsis and AKI, hydrate gently

## 2022-09-01 NOTE — ED Provider Notes (Signed)
Patient signed out to me from [facility], PA-C pending lactic acid and probable admission for increased lightheadedness, general weakness, difficulty standing.  She is clinically found to be dehydrated and has a UTI.  She had a significant elevated white blood cell count and developed some tachycardia raising concern for sepsis.  However her lactic acid is normal.  She was given IV fluids and IV Rocephin.  She is not uroseptic given initial lactic acid.  However with her UTI and significant weakness she will require admission for further treatment of her symptoms.  Call placed to the hospitalist for admission.  Discussed with Dr. Denton Brick who accepts pt for admission.   Evalee Jefferson, PA-C 09/01/22 2102    Fredia Sorrow, MD 09/04/22 1400

## 2022-09-01 NOTE — H&P (Signed)
History and Physical    Heather Garrett [id number] DOB: 07-28-1950 DOA: 09/01/2022  PCP: [name], FNP   Patient coming from: home  I have personally briefly reviewed patient's old medical records in [place]  Chief Complaint: Weakness  HPI: Heather Garrett is a 72 y.o. female with medical history significant for bladder cancer, diastolic CHF, diabetes mellitus, hypertension, OSA. Patient was brought to the ED reports of generalized weakness, with dizziness ongoing over the past 6 months, but significantly worse over the past week.  She has baseline vertigo, but her dizziness is worse.  She is unable to ambulate due to generalized weakness and dizziness. She reports pain with urination that started today, she also reports lower abdominal pain. She reports poor oral intake, vomiting no loose stools.  He also reports worsening swelling to her bilateral lower extremities over the past week.  1 episode of fall when she slid off her recliner and her neighbors helped her up, otherwise no falls.  She did not lose consciousness.  She did not hit her head.   No fever no chills no chest pain no difficulty breathing.  ED Course: Tmax 98.  Heart rate 88-110.  Respiratory 12-16.  Blood pressure systolic [id number] to [id number].  O2 sats greater than 96% on room air.  Leukocytosis of 18.7.  Creatinine elevated 1.67.  Sodium 131.  Lactic acid 1.9.  UA suggestive of UTI.  Chest x-ray clear.  CT abdomen pelvis without contrast shows fatty liver without other acute abnormality. 2 g IV Rocephin given, 1 L bolus given.  Hospitalist to admit for UTI, generalized weakness.  Review of Systems: As per HPI all other systems reviewed and negative.  Past Medical History:  Diagnosis Date   Allergy    SEASONAL   Anemia    "YEAR IN HALF AGO "   Anxiety    Arthritis    OSTEOARTHRITIS   Biceps tendinitis    Bladder cancer ([place]) 2014   Breast cancer ([place]) 1990   Cataract    [place] 2 YEARS  AGO UPDATED 05/03/22   Chronic diastolic CHF (congestive heart failure) (HCC)    Cobalamin deficiency    Coronary arteriosclerosis in native artery    Diabetes mellitus without complication ([facility])    "YEARS AGO WHEN i WAS OVER WEIGHTBUT NOT NOW" UPDATED 05/03/22   Elevated liver function tests    GERD (gastroesophageal reflux disease)    "A LITTLE"   Heart murmur    Hiatal hernia    Hyperlipidemia    Hypertension    Migraine    Myocardial infarction ([place])    Rotator cuff syndrome    Sleep apnea    "YES BUT RESOLVED",UPDATED 05/03/22   Transitional cell carcinoma, 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  She has never been exposed to tobacco smoke. She has never used smokeless tobacco. She reports current alcohol use of about 14.0 standard drinks of alcohol per week. She reports that she does not use drugs.  Allergies  Allergen Reactions   Contrast Media [Iodinated Contrast Media] Anaphylaxis and Hives   Ciprofloxacin Other (See Comments)    Interaction w other medicine   Erythromycin Base Nausea And Vomiting   Sulfabenzamide Other (See Comments)    UNK reaction    Family History  Problem Relation Age of Onset   Congestive Heart Failure Mother    Coronary artery disease Father    Heart attack Father 63       cause of death.   Diabetes Mellitus II Sister    Hypertension Sister    Breast cancer Sister     Migraines Neg Hx    Colon cancer Neg Hx    Esophageal cancer Neg Hx    Stomach cancer Neg Hx    Colon polyps Neg Hx    Crohn's disease Neg Hx    Rectal cancer Neg Hx    Ulcerative colitis Neg Hx     Prior to Admission medications   Medication Sig Start Date End Date Taking? Authorizing Provider  alendronate (FOSAMAX) 70 MG tablet 70 mg once a week. 04/19/22   [provider]  aspirin 81 MG EC tablet Take 1 tablet (81 mg total) by mouth every other day. Swallow whole.  Hold aspirin while taking eliquis, Patient taking differently: Take 81 mg by mouth daily. Swallow whole.  Hold aspirin while taking eliquis, 09/10/21   Florencia Reasons, MD  Atogepant (QULIPTA) 60 MG TABS Take 1 tablet (60 mg total) by mouth daily. 08/11/22   Frann Rider, NP  atorvastatin (LIPITOR) 40 MG tablet Take 40 mg by mouth daily. 05/20/22   [provider]  carvedilol (COREG) 3.125 MG tablet Take 1 tablet (3.125 mg total) by mouth 2 (two) times daily. Hold if sbp less than 100 or heart rate less than 50 06/09/22   Pemberton, Greer Ee, MD  celecoxib (CELEBREX) 100 MG capsule Take 1 capsule by mouth twice a day with meals Patient taking differently: Take 100 mg by mouth 2 (two) times daily. 06/09/22   [name], FNP  cephALEXin (KEFLEX) 500 MG capsule Take 500 mg by mouth 2 (two) times daily.    [provider]  ezetimibe (ZETIA) 10 MG tablet Take 1 tablet (10 mg total) by mouth daily. 05/20/22   [name], FNP  famotidine (PEPCID) 40 MG tablet Take 1 tablet (40 mg total) by mouth as needed. 03/01/22   [name], FNP  ferrous sulfate 325 (65 FE) MG EC tablet Take 1 tablet (325 mg total) by mouth every Monday, Wednesday, and Friday. Patient taking differently: Take 325 mg by mouth every Monday, Wednesday, and Friday. Pt taking once a week 09/10/21   Florencia Reasons, MD  folic acid (FOLVITE) 1 MG tablet Take 1 tablet (1 mg total) by mouth daily. 12/03/21 12/03/22  Florencia Reasons, MD  gabapentin  (NEURONTIN) 100 MG capsule Take 1 capsule (100 mg total) by mouth 3 (three) times daily. 08/22/22   Lyndal Pulley, MD  lidocaine (XYLOCAINE) 2 % solution Use as directed 5 mLs in the mouth or throat every 3 (three) hours as needed for mouth pain. 08/16/22   Volney American, PA-C  meclizine (ANTIVERT) 12.5 MG tablet Take 1 tablet (12.5 mg total) by mouth 3 (three) times daily as needed for  dizziness. 08/17/22   Lourdes Sledge A, PA-C  Na Sulfate-K Sulfate-Mg Sulf (SUPREP BOWEL PREP KIT) 17.5-3.13-1.6 GM/177ML SOLN Take 1 kit by mouth as directed. For colonoscopy prep 08/30/22   Daryel November, MD  nystatin-triamcinolone ointment [facility]) 1 Topical Daily 04/19/22   [provider]  pantoprazole (PROTONIX) 40 MG tablet Take 1 tablet (40 mg total) by mouth 2 (two) times daily before a meal. Patient taking differently: Take 40 mg by mouth daily. 06/07/22   [name], FNP  Polyethylene Glycol 400 (BLINK TEARS) 0.25 % SOLN Place 1 drop into both eyes daily as needed (dry eyes).    [provider]  sertraline (ZOLOFT) 100 MG tablet Take 1 tablet (100 mg total) by mouth daily. 03/11/22   [name], FNP  simvastatin (ZOCOR) 20 MG tablet Take 1 tablet (20 mg total) by mouth at bedtime. 05/20/22   [name], FNP  tiZANidine (ZANAFLEX) 4 MG tablet Take 1 tablet (4 mg total) by mouth at bedtime. 03/21/22   [name], FNP  trimethobenzamide (TIGAN) 300 MG capsule Take 1 capsule (300 mg total) by mouth 3 (three) times daily. 03/17/22   Paseda, Dewaine Conger, FNP  Ubrogepant (UBRELVY) 100 MG TABS Take 100 mg by mouth daily as needed (migraine headaches). Can repeat x1 after 2 hours if needed. Max dose 238m/24 hours 05/02/22   MFrann Rider NP    Physical Exam: Vitals:   09/01/22 1418 09/01/22 1419 09/01/22 1808 09/01/22 1946  BP: 121/87  (!) 148/90 (!) 138/95  Pulse: 88  (!) 110 (!) 102  Resp: 16  12 14  $ Temp: 98 F (36.7 C)  97.6 F (36.4 C) 97.6 F (36.4 C)   TempSrc: Oral  Oral Oral  SpO2: 98%  96% 100%  Weight:  50.8 kg    Height:  4' 8"$  (1.422 m)      Constitutional: NAD, calm, comfortable Vitals:   09/01/22 1418 09/01/22 1419 09/01/22 1808 09/01/22 1946  BP: 121/87  (!) 148/90 (!) 138/95  Pulse: 88  (!) 110 (!) 102  Resp: 16  12 14  $ Temp: 98 F (36.7 C)  97.6 F (36.4 C) 97.6 F (36.4 C)  TempSrc: Oral  Oral Oral  SpO2: 98%  96% 100%  Weight:  50.8 kg    Height:  4' 8"$  (1.422 m)     Eyes: Slight eye discharge from both eyes, otherwise lids and conjunctivae normal without erythema ENMT: Mucous membranes are mildly dry.   Neck: normal, supple, no masses, no thyromegaly Respiratory: clear to auscultation bilaterally, no wheezing, no crackles. Normal respiratory effort. No accessory muscle use.  Cardiovascular: Regular rate and rhythm, no murmurs / rubs / gallops. 2 + Pitting bilateral lower extremity edema to upper legs, Bruise and discoloration to anterior aspect of right leg, no evidence of infection, extremities warm.  Abdomen: no tenderness, no masses palpated. No hepatosplenomegaly. Bowel sounds positive.  Musculoskeletal: no clubbing / cyanosis. No joint deformity upper and lower extremities.  Skin: Ecchymotic areas to bilateral upper extremities, no rashes, lesions, ulcers. No induration Neurologic: Barely able to lift bilateral lower extremities against gravity, good and equal grip strength to upper extremities, no facial asymmetry, speech clear and fluent without aphasia.   Psychiatric: Normal judgment and insight. Alert and oriented x 3. Normal mood.   Labs on Admission: I have personally reviewed following labs and imaging studies  CBC: Recent Labs  Lab 09/01/22 1520  WBC 18.7*  HGB 10.1*  HCT 31.4*  MCV 109.8*  PLT [id number]   Basic Metabolic Panel: Recent Labs  Lab 09/01/22 1520  NA 131*  K 4.7  CL 97*  CO2 21*  GLUCOSE 101*  BUN 20  CREATININE 1.67*  CALCIUM 7.5*   Urine analysis:    Component Value  Date/Time   COLORURINE AMBER (A) 09/01/2022 1850   APPEARANCEUR CLOUDY (A) 09/01/2022 1850   LABSPEC 1.014 09/01/2022 1850   PHURINE 5.0 09/01/2022 1850   GLUCOSEU NEGATIVE 09/01/2022 1850   HGBUR SMALL (A) 09/01/2022 1850   BILIRUBINUR NEGATIVE 09/01/2022 1850   BILIRUBINUR small (A) 08/16/2022 0915   KETONESUR NEGATIVE 09/01/2022 1850   PROTEINUR 100 (A) 09/01/2022 1850   UROBILINOGEN 0.2 08/16/2022 0915   NITRITE POSITIVE (A) 09/01/2022 1850   LEUKOCYTESUR LARGE (A) 09/01/2022 1850    Radiological Exams on Admission: DG Chest Portable 1 View  Result Date: 09/01/2022 CLINICAL DATA:  Weakness over the past few weeks. Now unable to stand. EXAM: PORTABLE CHEST 1 VIEW COMPARISON:  08/17/2022 FINDINGS: Heart size and pulmonary vascularity are normal. Lungs are clear. No pleural effusions. No pneumothorax. Mediastinal contours appear intact. Calcified and tortuous aorta. Degenerative changes in the spine and shoulders. Surgical clips in the right upper quadrant. IMPRESSION: No active disease. Electronically Signed   By: Lucienne Capers M.D.   On: 09/01/2022 19:33   CT ABDOMEN PELVIS WO CONTRAST  Result Date: 09/01/2022 CLINICAL DATA:  Right lower quadrant pain EXAM: CT ABDOMEN AND PELVIS WITHOUT CONTRAST TECHNIQUE: Multidetector CT imaging of the abdomen and pelvis was performed following the standard protocol without IV contrast. RADIATION DOSE REDUCTION: This exam was performed according to the departmental dose-optimization program which includes automated exposure control, adjustment of the mA and/or kV according to patient size and/or use of iterative reconstruction technique. COMPARISON:  01/26/2021 FINDINGS: Lower chest: Small hiatal hernia, stable.  No acute abnormality Hepatobiliary: Prior cholecystectomy. Severe diffuse fatty infiltration of the liver. No focal suspicious hepatic abnormality. Pancreas: No focal abnormality or ductal dilatation. Spleen: No focal abnormality.  Normal  size. Adrenals/Urinary Tract: No renal or adrenal mass. No stones or hydronephrosis. Urinary bladder unremarkable. Stomach/Bowel: Postoperative changes in the stomach and small bowel. Stomach, large and small bowel grossly unremarkable. Vascular/Lymphatic: Diffuse aortic atherosclerosis. No evidence of aneurysm or adenopathy. Reproductive: Uterus and adnexa unremarkable.  No mass. Other: Trace free fluid in the cul-de-sac.  No free air. Musculoskeletal: Prior left hip replacement. No acute bony abnormality. Degenerative changes in the lumbar spine. IMPRESSION: Severe diffuse fatty infiltration of the liver. Diffuse aortic atherosclerosis. Trace free fluid in the cul-de-sac. Small hiatal hernia. Electronically Signed   By: Rolm Baptise M.D.   On: 09/01/2022 18:06    EKG: Independently reviewed.  Sinus rhythm, rate 101.  QTc 420.  Nonspecific diffuse T wave abnormality.  Assessment/Plan Principal Problem:   Severe sepsis (HCC) Active Problems:   UTI (urinary tract infection)   AKI (acute kidney injury) ([place])   Generalized weakness   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HCC)   Hypertension   Chronic diastolic CHF (congestive heart failure) (HCC)   Assessment and Plan: * Severe sepsis (HCC) Severe sepsis secondary to UTI.  Meeting criteria with tachycardia heart rate 88-110, leukocytosis of 18.7.  With evidence of endorgan dysfunction AKI.  Lactic acid 1.9.  UA suggestive of UTI with nitrites and large leukocytes many bacteria.  Chest x-ray clear.  CT abdomen and pelvis shows fatty liver without acute abnormality. -IV ceftriaxone 2 g daily -1 Liter bolus given, continue N/s 75cc/hr x 12hrs -  Obtain blood cultures - Add- on urine cultures -PT prior to discharge.  AKI (acute kidney injury) ([place]) Creatinine elevated at 1.67, over the past 2 weeks creatinine is gradually increased from baseline of 0.7-0.8. -Hydrate  Generalized weakness Generalized weakness progressive over the past 6 months.   Now with severe sepsis from UTI.  Family considering rehab. -PT evaluation prior to discharge.   Chronic diastolic CHF (congestive heart failure) (HCC) Acute on chronic bilateral lower extremity edema, which could be from poor ambulation, and venous stasis, otherwise with a stable white opacity, and chest x-ray is clear.  Last echo 05/2022 EF of 55 to 60% with grade 1 DD.  Not on diuretics. - At this time she is dehydrated, with severe sepsis and AKI, hydrate gently  Hypertension Blood pressure stable. 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  She follows with urologist here in [place].  CT Abd and pelvis today unremarkable.  Migraine without aura and without status migrainosus, not intractable Home atogepant for migraine prophylaxis held for now, with renal insufficiency.   DVT prophylaxis: Heparin Code Status: FULL code- Confirmed with patient at bedside Family Communication: None at bedside Disposition Plan: > 2 days Consults called: None  Admission status: Inpt tele I certify that at the point of admission it is my clinical judgment that the patient will require inpatient hospital care spanning beyond 2 midnights from the point of admission due to high intensity of service, high risk for further deterioration and high frequency of surveillance required.    Author: Bethena Roys, MD 09/01/2022 9:47 PM  For on call review [email or web address].

## 2022-09-01 NOTE — ED Triage Notes (Signed)
Pt reports she has been getting weak in her legs over the past few weeks and now can not stand at all.

## 2022-09-01 NOTE — Assessment & Plan Note (Addendum)
She tells me she was treated for this in 2014 with chemotherapy and she is currently in remission.  She follows with urologist here in [place].  CT Abd and pelvis today unremarkable.

## 2022-09-02 ENCOUNTER — Encounter (HOSPITAL_COMMUNITY): Payer: Medicare PPO

## 2022-09-02 DIAGNOSIS — A419 Sepsis, unspecified organism: Secondary | ICD-10-CM | POA: Diagnosis present

## 2022-09-02 DIAGNOSIS — R6520 Severe sepsis without septic shock: Secondary | ICD-10-CM

## 2022-09-02 LAB — CBC
HCT: 23.2 % — ABNORMAL LOW (ref 36.0–46.0)
Hemoglobin: 7.4 g/dL — ABNORMAL LOW (ref 12.0–15.0)
MCH: 35.2 pg — ABNORMAL HIGH (ref 26.0–34.0)
MCHC: 31.9 g/dL (ref 30.0–36.0)
MCV: 110.5 fL — ABNORMAL HIGH (ref 80.0–100.0)
Platelets: 189 10*3/uL (ref 150–400)
RBC: 2.1 MIL/uL — ABNORMAL LOW (ref 3.87–5.11)
RDW: 17.2 % — ABNORMAL HIGH (ref 11.5–15.5)
WBC: 11.2 10*3/uL — ABNORMAL HIGH (ref 4.0–10.5)
nRBC: 0 % (ref 0.0–0.2)

## 2022-09-02 LAB — POC OCCULT BLOOD, ED: Fecal Occult Bld: NEGATIVE

## 2022-09-02 LAB — BASIC METABOLIC PANEL
Anion gap: 9 (ref 5–15)
BUN: 19 mg/dL (ref 8–23)
CO2: 21 mmol/L — ABNORMAL LOW (ref 22–32)
Calcium: 7.3 mg/dL — ABNORMAL LOW (ref 8.9–10.3)
Chloride: 103 mmol/L (ref 98–111)
Creatinine, Ser: 1.51 mg/dL — ABNORMAL HIGH (ref 0.44–1.00)
GFR, Estimated: 37 mL/min — ABNORMAL LOW (ref >60)
Glucose, Bld: 72 mg/dL (ref 70–99)
Potassium: 4.2 mmol/L (ref 3.5–5.1)
Sodium: 133 mmol/L — ABNORMAL LOW (ref 135–145)

## 2022-09-02 MED ORDER — SODIUM CHLORIDE 0.9 % IV SOLN: INTRAVENOUS | Status: DC

## 2022-09-02 MED ORDER — SODIUM CHLORIDE 0.9 % IV SOLN: INTRAVENOUS | Status: AC

## 2022-09-02 NOTE — Progress Notes (Signed)
Stated always has skin tears due to skin being so thin.  Used to walk with cane within last few months but has been so weak she is now [facility] bound and husband helps her to do transfers.

## 2022-09-02 NOTE — Plan of Care (Signed)
  Problem: Education: Goal: Knowledge of General Education information will improve Description: Including pain rating scale, medication(s)/side effects and non-pharmacologic comfort measures 09/02/2022 2349 by Zadie Rhine, RN Outcome: Progressing 09/02/2022 2345 by Zadie Rhine, RN Outcome: Progressing   Problem: Health Behavior/Discharge Planning: Goal: Ability to manage health-related needs will improve 09/02/2022 2349 by Zadie Rhine, RN Outcome: Progressing 09/02/2022 2345 by Zadie Rhine, RN Outcome: Progressing   Problem: Clinical Measurements: Goal: Ability to maintain clinical measurements within normal limits will improve 09/02/2022 2349 by Zadie Rhine, RN Outcome: Progressing 09/02/2022 2345 by Zadie Rhine, RN Outcome: Progressing Goal: Will remain free from infection 09/02/2022 2349 by Zadie Rhine, RN Outcome: Progressing 09/02/2022 2345 by Zadie Rhine, RN Outcome: Progressing   Problem: Education: Goal: Knowledge of General Education information will improve Description: Including pain rating scale, medication(s)/side effects and non-pharmacologic comfort measures 09/02/2022 2349 by Zadie Rhine, RN Outcome: Progressing 09/02/2022 2349 by Zadie Rhine, RN Outcome: Progressing 09/02/2022 2345 by Zadie Rhine, RN Outcome: Progressing   Problem: Health Behavior/Discharge Planning: Goal: Ability to manage health-related needs will improve 09/02/2022 2349 by Zadie Rhine, RN Outcome: Progressing 09/02/2022 2349 by Zadie Rhine, RN Outcome: Progressing 09/02/2022 2345 by Zadie Rhine, RN Outcome: Progressing   Problem: Clinical Measurements: Goal: Ability to maintain clinical measurements within normal limits will improve 09/02/2022 2349 by Zadie Rhine, RN Outcome: Progressing 09/02/2022 2349 by Zadie Rhine, RN Outcome: Progressing 09/02/2022 2345 by Zadie Rhine, RN Outcome:  Progressing Goal: Will remain free from infection 09/02/2022 2349 by Zadie Rhine, RN Outcome: Progressing 09/02/2022 2349 by Zadie Rhine, RN Outcome: Progressing 09/02/2022 2345 by Zadie Rhine, RN Outcome: Progressing Goal: Diagnostic test results will improve Outcome: Progressing Goal: Respiratory complications will improve Outcome: Progressing

## 2022-09-02 NOTE — NC FL2 (Signed)
[place] LEVEL OF CARE FORM     IDENTIFICATION  Patient Name: Heather Garrett Birthdate: [birth year] Sex: female Admission Date (Current Location): 09/01/2022  [facility] and [place] Number:  [employer] and Address:  [facility] [address], [place]      Provider Number: [id number]  Attending Physician Name and Address:  Roxan Hockey, MD  Relative Name and Phone Number:  Cly,Dennis H. 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   Sleep disturbance 07/06/2022   Hordeolum of left lower eyelid 06/27/2022   Positive screening for depression on 9-item Patient Health Questionnaire (PHQ-9) 06/27/2022   Nausea and vomiting 03/17/2022   Left ankle pain 02/20/2022   Acute respiratory failure with hypoxia (HCC) 12/10/2021   Severe sepsis ([place]) 12/10/2021   Multifocal pneumonia 12/10/2021   GERD (gastroesophageal reflux disease) 12/10/2021   Depression 12/10/2021   Chronic diastolic CHF (congestive heart failure) ([place]) 12/10/2021   Chronic anemia 12/10/2021   UTI (urinary tract infection) 11/28/2021   Prolonged QT interval 11/28/2021   Normocytic anemia 09/05/2021   Hyperkalemia 09/05/2021   Triggering of finger 05/17/2021   Heart disease 05/17/2021   Malignant tumor of breast ([place]) 05/17/2021   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[place]) 05/17/2021   Hypertension 05/17/2021   Lower extremity edema 05/14/2021   Benign paroxysmal positional vertigo of left ear 03/23/2021   Daytime somnolence 03/23/2021   History of sleep apnea 03/23/2021    Orthostatic hypotension 03/23/2021   Migraine without aura and without status migrainosus, not intractable 03/23/2021   Coronary arteriosclerosis in native artery 09/28/2016   Elevated LFTs 09/28/2016   Rotator cuff syndrome 09/28/2016   Biceps tendinitis 09/28/2016   Cobalamin deficiency 09/28/2016   Hyperlipidemia 09/28/2016   Migraine 09/28/2016   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[place]) 09/28/2016   Vitamin D deficiency 09/28/2016    Orientation RESPIRATION BLADDER Height & Weight     Self, Time, Situation, Place  Normal External catheter Weight: 55.8 kg Height:  '4\' 8"'$  (142.2 cm)  BEHAVIORAL SYMPTOMS/MOOD NEUROLOGICAL BOWEL NUTRITION STATUS      Continent Diet  AMBULATORY STATUS COMMUNICATION OF NEEDS Skin   Extensive Assist Verbally Normal                       Personal Care Assistance Level of Assistance  Bathing, Feeding, Dressing Bathing Assistance: Maximum assistance Feeding assistance: Limited assistance Dressing Assistance: Maximum assistance     Functional Limitations Info  Sight, Hearing, Speech Sight Info: Impaired Hearing Info: Adequate Speech Info: Adequate    SPECIAL CARE FACTORS FREQUENCY  PT (By licensed PT)     PT Frequency: 5 times a week              Contractures Contractures Info: Not present    Additional Factors Info  Code Status, Allergies Code Status Info: Full Allergies Info: (Contrast Media (Iodinated Contrast Media), Ciprofloxacin, Erythromycin Base, Sulfabenzamide)           Current Medications (09/02/2022):  This is the current hospital active medication list Current Facility-Administered Medications  Medication Dose  Route Frequency Provider Last Rate Last Admin   acetaminophen (TYLENOL) tablet 650 mg  650 mg Oral Q6H PRN Emokpae, Ejiroghene E, MD       Or   acetaminophen (TYLENOL) suppository 650 mg  650 mg Rectal Q6H PRN Emokpae, Ejiroghene E, MD       aspirin EC tablet 81 mg  81 mg Oral Daily Emokpae,  Ejiroghene E, MD   81 mg at 09/02/22 1100   carvedilol (COREG) tablet 3.125 mg  3.125 mg Oral BID Emokpae, Ejiroghene E, MD   3.125 mg at 09/02/22 1100   cefTRIAXone (ROCEPHIN) 2 g in sodium chloride 0.9 % 100 mL IVPB  2 g Intravenous Q24H Emokpae, Ejiroghene E, MD   Stopped at 09/01/22 2030   gabapentin (NEURONTIN) capsule 100 mg  100 mg Oral TID Emokpae, Ejiroghene E, MD   100 mg at 09/02/22 1100   heparin injection 5,000 Units  5,000 Units Subcutaneous Q8H Emokpae, Ejiroghene E, MD   5,000 Units at 09/02/22 0627   ondansetron (ZOFRAN) tablet 4 mg  4 mg Oral Q8H PRN Emokpae, Ejiroghene E, MD       Or   ondansetron (ZOFRAN) injection 4 mg  4 mg Intravenous Q8H PRN Emokpae, Ejiroghene E, MD       pantoprazole (PROTONIX) EC tablet 40 mg  40 mg Oral Daily Emokpae, Ejiroghene E, MD   40 mg at 09/02/22 1100   polyethylene glycol (MIRALAX / GLYCOLAX) packet 17 g  17 g Oral Daily PRN Emokpae, Ejiroghene E, MD       traMADol (ULTRAM) tablet 50 mg  50 mg Oral Q8H PRN Emokpae, Ejiroghene E, MD   50 mg at 09/02/22 0034     Discharge Medications: Please see discharge summary for a list of discharge medications.  Relevant Imaging Results:  Relevant Lab Results:   Additional Information (SS# [id number])  Boneta Lucks, RN

## 2022-09-02 NOTE — TOC Initial Note (Signed)
Transition of Care [facility]) - Initial/Assessment Note    Patient Details  Name: Heather Garrett MRN: [id number] Date of Birth: October 07, 1950  Transition of Care [facility]) CM/SW Contact:    Boneta Lucks, RN Phone Number: 09/02/2022, 2:41 PM  Clinical Narrative:      Patient admitted with Severe sepsis. Patient lives at home with her husband. Several weeks ago, she was about to drive and was independent. Over the last 2-3 weeks she has become increasinly weak and using a cane, walker and wheelchair. PT eval is pending. Patient states she and her husband are wanting her to go SNF. [facility] is the first choice, then [facility]. FL2 completed and sent out. TOC to follow to start INS AUTH and send out PT notes.              Expected Discharge Plan: Skilled Nursing Facility Barriers to Discharge: Continued Medical Work up   Patient Goals and CMS Choice Patient states their goals for this hospitalization and ongoing recovery are:: to go to SNF CMS Medicare.gov Compare Post Acute Care list provided to:: Patient Choice offered to / list presented to : Patient      Expected Discharge Plan and Services       Living arrangements for the past 2 months: Single Family Home                      Prior Living Arrangements/Services Living arrangements for the past 2 months: Single Family Home Lives with:: Spouse            Activities of Daily Living Home Assistive Devices/Equipment: Wheelchair ADL Screening (condition at time of admission) Patient's cognitive ability adequate to safely complete daily activities?: Yes Is the patient deaf or have difficulty hearing?: No Does the patient have difficulty seeing, even when wearing glasses/contacts?: No Does the patient have difficulty concentrating, remembering, or making decisions?: No Patient able to express need for assistance with ADLs?: Yes Does the patient have difficulty dressing or bathing?: No Independently performs ADLs?: Yes (appropriate  for developmental age) Does the patient have difficulty walking or climbing stairs?: Yes Weakness of Legs: Both Weakness of Arms/Hands: None  Permission Sought/Granted      Emotional Assessment     Affect (typically observed): Accepting Orientation: : Oriented to Self, Oriented to Place, Oriented to  Time, Oriented to Situation Alcohol / Substance Use: Not Applicable Psych Involvement: No (comment)  Admission diagnosis:  Acute cystitis with hematuria [N30.01] Sepsis secondary to UTI ([place]) [A41.9, N39.0] Sepsis ([place]) [A41.9] Patient Active Problem List   Diagnosis Date Noted   Sepsis ([place]) 09/02/2022   AKI (acute kidney injury) ([place]) 09/01/2022   Generalized weakness 09/01/2022   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   Sleep disturbance 07/06/2022   Hordeolum of left lower eyelid 06/27/2022   Positive screening for depression on 9-item Patient Health Questionnaire (PHQ-9) 06/27/2022   Nausea and vomiting 03/17/2022   Left ankle pain 02/20/2022   Acute respiratory failure with hypoxia ([place]) 12/10/2021   Severe sepsis ([place]) 12/10/2021   Multifocal pneumonia 12/10/2021   GERD (gastroesophageal reflux disease) 12/10/2021   Depression 12/10/2021   Chronic diastolic CHF (congestive heart failure) ([place]) 12/10/2021   Chronic anemia 12/10/2021   UTI (urinary tract infection) 11/28/2021   Prolonged QT interval 11/28/2021   Normocytic anemia 09/05/2021   Hyperkalemia 09/05/2021   Triggering of finger 05/17/2021   Heart disease 05/17/2021   Malignant tumor of breast ([place]) 05/17/2021   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  [place]) 05/17/2021   Hypertension 05/17/2021   Lower extremity edema 05/14/2021   Benign paroxysmal positional vertigo of left ear 03/23/2021   Daytime somnolence 03/23/2021   History of sleep apnea 03/23/2021   Orthostatic hypotension 03/23/2021   Migraine without aura and without status migrainosus, not intractable 03/23/2021   Coronary arteriosclerosis in native  artery 09/28/2016   Elevated LFTs 09/28/2016   Rotator cuff syndrome 09/28/2016   Biceps tendinitis 09/28/2016   Cobalamin deficiency 09/28/2016   Hyperlipidemia 09/28/2016   Migraine 09/28/2016   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   Vitamin D deficiency 09/28/2016   PCP:  [name], FNP Pharmacy:   [place], [place] [id number] [place] [id number] Phone: [phone] Fax: [phone]     Social Determinants of Health (SDOH) Social History: SDOH Screenings   Depression (PHQ2-9): Medium Risk (08/29/2022)  Tobacco Use: Medium Risk (09/01/2022)   SDOH Interventions:    Readmission Risk Interventions    12/13/2021    3:40 PM  Readmission Risk Prevention Plan  Transportation Screening Complete  Medication Review ([place]) Complete  PCP or Specialist appointment within 3-5 days of discharge Complete  HRI or Home Care Consult Complete  SW Recovery Care/Counseling Consult Complete  Palliative Care Screening Complete  Skilled Nursing Facility Complete

## 2022-09-02 NOTE — ED Notes (Signed)
Multiple attempts unsuccessful for blood cultures throughout this shift

## 2022-09-02 NOTE — ED Notes (Signed)
Patient A&Ox 4. IVF infusing at 34m/hr. Patient updated on POC.

## 2022-09-02 NOTE — Plan of Care (Signed)

## 2022-09-02 NOTE — Evaluation (Signed)
Physical Therapy Evaluation Patient Details Name: FRIDAY BEGNOCHE MRN: [id number] DOB: [birth year] Today's Date: 09/02/2022  History of Present Illness  DELBRA MAYBERRY is a 72 y.o. female with medical history significant for bladder cancer, diastolic CHF, diabetes mellitus, hypertension, OSA.  Patient was brought to the ED reports of generalized weakness, with dizziness ongoing over the past 6 months, but significantly worse over the past week.  She has baseline vertigo, but her dizziness is worse.  She is unable to ambulate due to generalized weakness and dizziness. She reports pain with urination that started today, she also reports lower abdominal pain. She reports poor oral intake, vomiting no loose stools.  He also reports worsening swelling to her bilateral lower extremities over the past week.  1 episode of fall when she slid off her recliner and her neighbors helped her up, otherwise no falls.  She did not lose consciousness.  She did not hit her head.    No fever no chills no chest pain no difficulty breathing.   Clinical Impression  Patient demonstrates slow labored movement for sitting up at bedside with c/o severe pain BUE/LE due to fragile skin, able to transfer to/from commode with labored movement and Mod/max assist and limited to a few slow labored unsteady side steps with buckling of knees due to weakness.  Patient tolerated sitting up in chair after therapy - nurse notified.  Patient will benefit from continued skilled physical therapy in hospital and recommended venue below to increase strength, balance, endurance for safe ADLs and gait.          Recommendations for follow up therapy are one component of a multi-disciplinary discharge planning process, led by the attending physician.  Recommendations may be updated based on patient status, additional functional criteria and insurance authorization.  Follow Up Recommendations Skilled nursing-short term rehab (<3  hours/day) Can patient physically be transported by private vehicle: No    Assistance Recommended at Discharge Set up Supervision/Assistance  Patient can return home with the following  A lot of help with bathing/dressing/bathroom;A lot of help with walking and/or transfers;Help with stairs or ramp for entrance;Assistance with cooking/housework    Equipment Recommendations None recommended by PT  Recommendations for Other Services       Functional Status Assessment Patient has had a recent decline in their functional status and demonstrates the ability to make significant improvements in function in a reasonable and predictable amount of time.     Precautions / Restrictions Precautions Precautions: Fall Restrictions Weight Bearing Restrictions: No      Mobility  Bed Mobility Overal bed mobility: Needs Assistance Bed Mobility: Supine to Sit     Supine to sit: Mod assist     General bed mobility comments: increased time, labored movement    Transfers Overall transfer level: Needs assistance Equipment used: Rolling walker (2 wheels) Transfers: Sit to/from Stand, Bed to chair/wheelchair/BSC Sit to Stand: Mod assist, Max assist   Step pivot transfers: Mod assist, Max assist       General transfer comment: unsteady labored movement with frequent buckling of knees    Ambulation/Gait Ambulation/Gait assistance: Max assist Gait Distance (Feet): 3 Feet Assistive device: Rolling walker (2 wheels) Gait Pattern/deviations: Decreased step length - right, Decreased step length - left, Decreased stride length Gait velocity: slow     General Gait Details: limited to a few slow labored side steps with buckling of knees and unsafe to attempt taking steps away from bedside due to weakness  Stairs  Wheelchair Mobility    Modified Rankin (Stroke Patients Only)       Balance Overall balance assessment: Needs assistance Sitting-balance support: Feet  supported, No upper extremity supported Sitting balance-Leahy Scale: Fair Sitting balance - Comments: seated at EOB   Standing balance support: During functional activity, Reliant on assistive device for balance, Bilateral upper extremity supported Standing balance-Leahy Scale: Poor Standing balance comment: using RW                             Pertinent Vitals/Pain Pain Assessment Pain Assessment: Faces Faces Pain Scale: Hurts even more Pain Location: BUE, BLE with pressure to skin due to fragile skin Pain Descriptors / Indicators: Sore, Discomfort, Grimacing, Guarding Pain Intervention(s): Limited activity within patient's tolerance, Monitored during session, Repositioned    Home Living Family/patient expects to be discharged to:: Private residence Living Arrangements: Spouse/significant other Available Help at Discharge: Family Type of Home: House Home Access: Stairs to enter;Other (comment)   Entrance Stairs-Number of Steps: 1   Home Layout: One level Home Equipment: Conservation officer, nature (2 wheels);Rollator (4 wheels);Wheelchair - manual;BSC/3in1      Prior Function Prior Level of Function : Needs assist       Physical Assist : ADLs (physical);Mobility (physical) Mobility (physical): Bed mobility;Transfers;Stairs;Gait   Mobility Comments: was able to ambulate with RW, but recently limited to assisted transfers due to weakness since last SNF stay ADLs Comments: assisted by family     Hand Dominance   Dominant Hand: Right    Extremity/Trunk Assessment   Upper Extremity Assessment Upper Extremity Assessment: Generalized weakness    Lower Extremity Assessment Lower Extremity Assessment: Generalized weakness    Cervical / Trunk Assessment Cervical / Trunk Assessment: Kyphotic  Communication   Communication: No difficulties  Cognition Arousal/Alertness: Awake/alert Behavior During Therapy: WFL for tasks assessed/performed Overall Cognitive Status:  Within Functional Limits for tasks assessed                                          General Comments      Exercises     Assessment/Plan    PT Assessment Patient needs continued PT services  PT Problem List Decreased strength;Decreased activity tolerance;Decreased balance;Decreased mobility       PT Treatment Interventions DME instruction;Gait training;Stair training;Functional mobility training;Therapeutic activities;Therapeutic exercise;Patient/family education;Balance training    PT Goals (Current goals can be found in the Care Plan section)  Acute Rehab PT Goals Patient Stated Goal: return home after rehab PT Goal Formulation: With patient Time For Goal Achievement: 09/16/22 Potential to Achieve Goals: Good    Frequency Min 3X/week     Co-evaluation               AM-PAC PT "6 Clicks" Mobility  Outcome Measure Help needed turning from your back to your side while in a flat bed without using bedrails?: A Lot Help needed moving from lying on your back to sitting on the side of a flat bed without using bedrails?: A Lot Help needed moving to and from a bed to a chair (including a wheelchair)?: A Lot Help needed standing up from a chair using your arms (e.g., wheelchair or bedside chair)?: A Lot Help needed to walk in hospital room?: A Lot Help needed climbing 3-5 steps with a railing? : Total 6 Click Score: 11    End  of Session   Activity Tolerance: Patient tolerated treatment well;Patient limited by fatigue Patient left: in chair;with call bell/phone within reach Nurse Communication: Mobility status PT Visit Diagnosis: Unsteadiness on feet (R26.81);Other abnormalities of gait and mobility (R26.89);Muscle weakness (generalized) (M62.81)    Time: [id number] PT Time Calculation (min) (ACUTE ONLY): 29 min   Charges:   PT Evaluation $PT Eval Moderate Complexity: 1 Mod PT Treatments $Therapeutic Activity: 23-37 mins        3:41 PM,  09/02/22 Lonell Grandchild, MPT Physical Therapist with [id number] [facility] 336 [phone] office [phone] mobile phone

## 2022-09-02 NOTE — Progress Notes (Signed)
PROGRESS NOTE     Nonya Pahl, is a 72 y.o. female, DOB - 11/18/1950, [id number]  Admit date - 09/01/2022   Admitting Physician Pranay Hilbun Denton Brick, MD  Outpatient Primary MD for the patient is [name], [place]  LOS - 1  Chief Complaint  Patient presents with   Extremity Weakness        Brief Narrative:  72 y.o. female with medical history significant for bladder cancer, diastolic CHF, diabetes mellitus, hypertension, OSA.  Admitted with severe sepsis on 09/01/2022 presumably from urinary source -Patient prior urine culture from 08/2022 grew pansensitive E. coli and Proteus mirabilis    -Assessment and Plan: 1)Severe sepsis from urinary source--POA -Continue IV Rocephin pending urine and blood cultures from 09/01/2021  2)AKI suspect due to dehydration/sepsis/UTI - renally adjust medications, avoid nephrotoxic agents / dehydration  / hypotension Admission creatinine 1.6, recent baseline between 0.8 and 1 -continue trending down with hydration  3)Generalized weakness Generalized weakness progressive over the past 6 months.  Now with severe sepsis from UTI.  -PT eval appreciated recommends SNF rehab  4)Chronic diastolic CHF (congestive heart failure) (HCC) Acute on chronic bilateral lower extremity edema, which could be from poor ambulation, and venous stasis, otherwise with a stable white opacity, and chest x-ray is clear.   Last echo 05/2022 EF of 55 to 60% with grade 1 DD.  Not on diuretics. 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  She follows with urologist here in [place].  CT Abd and pelvis today unremarkable.  Migraine without aura and without status migrainosus, not intractable Home atogepant for migraine prophylaxis held for now, with renal insufficiency.  Status is:  Inpatient   Disposition: The patient is from: Home              Anticipated d/c is to: SNF              Anticipated d/c date is: 2 days              Patient currently is not medically stable to d/c. Barriers: Not Clinically Stable-   Code Status :  -  Code Status: Full Code   Family Communication:    NA (patient is alert, awake and coherent)   DVT Prophylaxis  :   - SCDs   heparin injection 5,000 Units Start: 09/02/22 0000  Lab Results  Component Value Date   PLT 189 09/02/2022   Inpatient Medications  Scheduled Meds:  aspirin EC  81 mg Oral Daily   carvedilol  3.125 mg Oral BID   gabapentin  100 mg Oral TID   heparin  5,000 Units Subcutaneous Q8H   pantoprazole  40 mg Oral Daily   Continuous Infusions:  cefTRIAXone (ROCEPHIN)  IV Stopped (09/01/22 2030)   PRN Meds:.acetaminophen **OR** acetaminophen, ondansetron **OR** ondansetron (ZOFRAN) IV, polyethylene glycol, traMADol   Anti-infectives (From admission, onward)    Start     Dose/Rate Route Frequency Ordered Stop   09/01/22 1945  cefTRIAXone (ROCEPHIN) 2 g in sodium chloride 0.9 % 100 mL IVPB        2 g 200 mL/hr over 30 Minutes Intravenous Every 24 hours 09/01/22 1931 2022-09-25 1944      Subjective: [name] today has no fevers, no emesis,  No chest pain,   -No CVA tenderness -Complains of fatigue and  generalized weakness  Objective: Vitals:   09/02/22 0700 09/02/22 0822 09/02/22 1100 09/02/22 1232  BP: 130/84 (!) 143/98 139/83 (!) 141/92  Pulse:  78 82 78  Resp: (!) '8 19  18  '$ Temp:  97.9 F (36.6 C)  (!) 97.5 F (36.4 C)  TempSrc:  Oral  Oral  SpO2:  95%  98%  Weight:    55.8 kg  Height:    '4\' 8"'$  (1.422 m)    Intake/Output Summary (Last 24 hours) at 09/02/2022 1909 Last data filed at 09/02/2022 1700 Gross per 24 hour  Intake 1584.7 ml  Output --  Net 1584.7 ml   Filed Weights   09/01/22 1419 09/02/22 1232  Weight: 50.8 kg 55.8 kg   Physical Exam  Gen:- Awake Alert,  in no apparent  distress  HEENT:- [place].AT, No sclera icterus Neck-Supple Neck,No JVD,.  Lungs-  CTAB , fair symmetrical air movement CV- S1, S2 normal, regular  Abd-  +ve B.Sounds, Abd Soft, No tenderness, no CVA area tenderness Extremity/Skin:- No  edema, pedal pulses present  Psych-affect is appropriate, oriented x3 Neuro-no new focal deficits, no tremors  Data Reviewed: I have personally reviewed following labs and imaging studies  CBC: Recent Labs  Lab 09/01/22 1520 09/02/22 0750  WBC 18.7* 11.2*  HGB 10.1* 7.4*  HCT 31.4* 23.2*  MCV 109.8* 110.5*  PLT 223 99991111   Basic Metabolic Panel: Recent Labs  Lab 09/01/22 1520 09/02/22 0605  NA 131* 133*  K 4.7 4.2  CL 97* 103  CO2 21* 21*  GLUCOSE 101* 72  BUN 20 19  CREATININE 1.67* 1.51*  CALCIUM 7.5* 7.3*   GFR: Estimated Creatinine Clearance: 23.4 mL/min (A) (by C-G formula based on SCr of 1.51 mg/dL (H)).  Recent Results (from the past 240 hour(s))  Resp panel by RT-PCR (RSV, Flu A&B, Covid) Anterior Nasal Swab     Status: None   Collection Time: 09/01/22  4:48 PM   Specimen: Anterior Nasal Swab  Result Value Ref Range Status   SARS Coronavirus 2 by RT PCR NEGATIVE NEGATIVE Final    Comment: (NOTE) SARS-CoV-2 target nucleic acids are NOT DETECTED.  The SARS-CoV-2 RNA is generally detectable in upper respiratory specimens during the acute phase of infection. The lowest concentration of SARS-CoV-2 viral copies this assay can detect is 138 copies/mL. A negative result does not preclude SARS-Cov-2 infection and should not be used as the sole basis for treatment or other patient management decisions. A negative result may occur with  improper specimen collection/handling, submission of specimen other than nasopharyngeal swab, presence of viral mutation(s) within the areas targeted by this assay, and inadequate number of viral copies(<138 copies/mL). A negative result must be combined with clinical observations, patient history, and  epidemiological information. The expected result is Negative.  Fact Sheet for Patients:  EntrepreneurPulse.com.au  Fact Sheet for Healthcare Providers:  IncredibleEmployment.be  This test is no t yet approved or cleared by the [place] FDA and  has been authorized for detection and/or diagnosis of SARS-CoV-2 by FDA under an Emergency Use Authorization (EUA). This EUA will remain  in effect (meaning this test can be used) for the duration of the COVID-19 declaration under Section 564(b)(1) of the Act, 21 U.S.C.section 360bbb-3(b)(1), unless the authorization is terminated  or revoked sooner.       Influenza A by PCR NEGATIVE NEGATIVE Final   Influenza B by PCR NEGATIVE NEGATIVE Final    Comment: (NOTE) The Xpert Xpress SARS-CoV-2/FLU/RSV plus assay is  intended as an aid in the diagnosis of influenza from Nasopharyngeal swab specimens and should not be used as a sole basis for treatment. Nasal washings and aspirates are unacceptable for Xpert Xpress SARS-CoV-2/FLU/RSV testing.  Fact Sheet for Patients: EntrepreneurPulse.com.au  Fact Sheet for Healthcare Providers: IncredibleEmployment.be  This test is not yet approved or cleared by the [place] FDA and has been authorized for detection and/or diagnosis of SARS-CoV-2 by FDA under an Emergency Use Authorization (EUA). This EUA will remain in effect (meaning this test can be used) for the duration of the COVID-19 declaration under Section 564(b)(1) of the Act, 21 U.S.C. section 360bbb-3(b)(1), unless the authorization is terminated or revoked.     Resp Syncytial Virus by PCR NEGATIVE NEGATIVE Final    Comment: (NOTE) Fact Sheet for Patients: EntrepreneurPulse.com.au  Fact Sheet for Healthcare Providers: IncredibleEmployment.be  This test is not yet approved or cleared by the [place] FDA and has been  authorized for detection and/or diagnosis of SARS-CoV-2 by FDA under an Emergency Use Authorization (EUA). This EUA will remain in effect (meaning this test can be used) for the duration of the COVID-19 declaration under Section 564(b)(1) of the Act, 21 U.S.C. section 360bbb-3(b)(1), unless the authorization is terminated or revoked.  Performed at [facility], [address], [place], [place] [address]   Culture, blood (Routine X 2) w Reflex to ID Panel     Status: None (Preliminary result)   Collection Time: 09/02/22  6:05 AM   Specimen: BLOOD RIGHT HAND  Result Value Ref Range Status   Specimen Description   Final    BLOOD RIGHT HAND BOTTLES DRAWN AEROBIC AND ANAEROBIC   Special Requests   Final    Blood Culture adequate volume Performed at [facility], [address] [place], [place] [address]    Culture PENDING  Incomplete   Report Status PENDING  Incomplete  Culture, blood (Routine X 2) w Reflex to ID Panel     Status: None (Preliminary result)   Collection Time: 09/02/22  6:05 AM   Specimen: BLOOD LEFT HAND  Result Value Ref Range Status   Specimen Description   Final    BLOOD LEFT HAND BOTTLES DRAWN AEROBIC AND ANAEROBIC   Special Requests   Final    Blood Culture adequate volume Performed at [facility], [address], [place], [place] [address]    Culture PENDING  Incomplete   Report Status PENDING  Incomplete      Radiology Studies: DG Chest Portable 1 View  Result Date: 09/01/2022 CLINICAL DATA:  Weakness over the past few weeks. Now unable to stand. EXAM: PORTABLE CHEST 1 VIEW COMPARISON:  08/17/2022 FINDINGS: Heart size and pulmonary vascularity are normal. Lungs are clear. No pleural effusions. No pneumothorax. Mediastinal contours appear intact. Calcified and tortuous aorta. Degenerative changes in the spine and shoulders. Surgical clips in the right upper quadrant. IMPRESSION: No active disease. Electronically Signed   By: Lucienne Capers M.D.   On:  09/01/2022 19:33   CT ABDOMEN PELVIS WO CONTRAST  Result Date: 09/01/2022 CLINICAL DATA:  Right lower quadrant pain EXAM: CT ABDOMEN AND PELVIS WITHOUT CONTRAST TECHNIQUE: Multidetector CT imaging of the abdomen and pelvis was performed following the standard protocol without IV contrast. RADIATION DOSE REDUCTION: This exam was performed according to the departmental dose-optimization program which includes automated exposure control, adjustment of the mA and/or kV according to patient size and/or use of iterative reconstruction technique. COMPARISON:  01/26/2021 FINDINGS: Lower chest: Small hiatal hernia, stable.  No acute abnormality Hepatobiliary: Prior cholecystectomy. Severe diffuse fatty infiltration of the liver. No focal suspicious hepatic abnormality. Pancreas: No focal abnormality or ductal dilatation. Spleen: No focal abnormality.  Normal size. Adrenals/Urinary Tract: No renal or adrenal mass. No stones or hydronephrosis. Urinary bladder unremarkable. Stomach/Bowel: Postoperative changes in the stomach and small bowel. Stomach, large and small bowel grossly unremarkable. Vascular/Lymphatic: Diffuse aortic atherosclerosis. No evidence of aneurysm or adenopathy. Reproductive: Uterus and adnexa unremarkable.  No mass. Other: Trace free fluid in the cul-de-sac.  No free air. Musculoskeletal: Prior left hip replacement. No acute bony abnormality. Degenerative changes in the lumbar spine. IMPRESSION: Severe diffuse fatty infiltration of the liver. Diffuse aortic atherosclerosis. Trace free fluid in the cul-de-sac. Small hiatal hernia. Electronically Signed   By: Rolm Baptise M.D.   On: 09/01/2022 18:06     Scheduled Meds:  aspirin EC  81 mg Oral Daily   carvedilol  3.125 mg Oral BID   gabapentin  100 mg Oral TID   heparin  5,000 Units Subcutaneous Q8H   pantoprazole  40 mg Oral Daily   Continuous Infusions:  cefTRIAXone (ROCEPHIN)  IV Stopped (09/01/22 2030)     LOS: 1 day    Roxan Hockey M.D on 09/02/2022 at 7:09 PM  Go to [email or web address] - for contact info  [facility] - Office  [phone]  If 7PM-7AM, please contact night-coverage [email or web address] 09/02/2022, 7:09 PM

## 2022-09-02 NOTE — Plan of Care (Signed)
  Problem: Acute Rehab PT Goals(only PT should resolve) Goal: Pt Will Go Supine/Side To Sit Outcome: Progressing Flowsheets (Taken 09/02/2022 1542) Pt will go Supine/Side to Sit:  with minimal assist  with moderate assist Goal: Patient Will Transfer Sit To/From Stand Outcome: Progressing Flowsheets (Taken 09/02/2022 1542) Patient will transfer sit to/from stand:  with minimal assist  with moderate assist Goal: Pt Will Transfer Bed To Chair/Chair To Bed Outcome: Progressing Flowsheets (Taken 09/02/2022 1542) Pt will Transfer Bed to Chair/Chair to Bed:  with min assist  with mod assist Goal: Pt Will Ambulate Outcome: Progressing Flowsheets (Taken 09/02/2022 1542) Pt will Ambulate:  10 feet  with minimal assist  with moderate assist  with rolling walker   3:42 PM, 09/02/22 Lonell Grandchild, MPT Physical Therapist with [facility] 336 [phone] office [phone] mobile phone

## 2022-09-03 DIAGNOSIS — R6520 Severe sepsis without septic shock: Secondary | ICD-10-CM | POA: Diagnosis not present

## 2022-09-03 DIAGNOSIS — A419 Sepsis, unspecified organism: Secondary | ICD-10-CM | POA: Diagnosis not present

## 2022-09-03 MED ORDER — SODIUM CHLORIDE 0.9 % IV BOLUS
250.0000 mL | Freq: Once | INTRAVENOUS | Status: AC
  Administered 2022-09-03: 250 mL via INTRAVENOUS

## 2022-09-03 MED ORDER — CHLORHEXIDINE GLUCONATE CLOTH 2 % EX PADS
6.0000 | MEDICATED_PAD | Freq: Every day | CUTANEOUS | Status: DC
  Administered 2022-09-03 – 2022-09-05 (×3): 6 via TOPICAL

## 2022-09-03 MED ORDER — CARVEDILOL 3.125 MG PO TABS: 3.1250 mg | ORAL_TABLET | Freq: Two times a day (BID) | ORAL | Status: DC

## 2022-09-03 MED ORDER — MIDODRINE HCL 5 MG PO TABS
5.0000 mg | ORAL_TABLET | Freq: Three times a day (TID) | ORAL | Status: DC
  Administered 2022-09-03 – 2022-09-05 (×5): 5 mg via ORAL
  Filled 2022-09-03 (×5): qty 1

## 2022-09-03 MED ORDER — SODIUM CHLORIDE 0.9 % IV BOLUS: 500.0000 mL | Freq: Once | INTRAVENOUS | Status: DC

## 2022-09-03 MED ORDER — POLYVINYL ALCOHOL 1.4 % OP SOLN
2.0000 [drp] | OPHTHALMIC | Status: DC | PRN
  Filled 2022-09-03: qty 15

## 2022-09-03 MED ORDER — SODIUM CHLORIDE 0.9 % IV SOLN: INTRAVENOUS | Status: DC

## 2022-09-03 MED ORDER — SODIUM CHLORIDE 0.9 % IV BOLUS
1000.0000 mL | Freq: Once | INTRAVENOUS | Status: AC
  Administered 2022-09-03: 1000 mL via INTRAVENOUS

## 2022-09-03 MED ORDER — NOREPINEPHRINE 4 MG/250ML-% IV SOLN: 2.0000 ug/min | INTRAVENOUS | Status: DC

## 2022-09-03 MED ORDER — SODIUM CHLORIDE 0.9 % IV BOLUS
500.0000 mL | Freq: Once | INTRAVENOUS | Status: AC
  Administered 2022-09-03: 500 mL via INTRAVENOUS

## 2022-09-03 MED ORDER — SODIUM CHLORIDE 0.9 % IV SOLN
250.0000 mL | INTRAVENOUS | Status: DC
  Administered 2022-09-04: 250 mL via INTRAVENOUS

## 2022-09-03 NOTE — Progress Notes (Signed)
PROGRESS NOTE     Heather Garrett, is a 72 y.o. female, DOB - October 04, 1950, [id number]  Admit date - 09/01/2022   Admitting Physician Maximilian Tallo Denton Brick, MD  Outpatient Primary MD for the patient is [name], [place]  LOS - 2  Chief Complaint  Patient presents with   Extremity Weakness        Brief Narrative:  72 y.o. female with medical history significant for bladder cancer, diastolic CHF, diabetes mellitus, hypertension, OSA.  Admitted with severe sepsis on 09/01/2022 presumably from urinary source -Patient prior urine culture from 08/2022 grew pansensitive E. coli and Proteus mirabilis    -Assessment and Plan: 1)Severe GNR Sepsis from urinary source--POA -Continue IV Rocephin pending urine and blood cultures from 09/01/2021  2)AKI suspect due to dehydration/sepsis/UTI - renally adjust medications, avoid nephrotoxic agents / dehydration  / hypotension Admission creatinine 1.6, recent baseline between 0.8 and 1 -continue trending down with hydration  3)Hypotension -Hold Coreg Improved with IV fluids- -continue IV antibiotics -Check a.m. cortisol level  4)Chronic diastolic CHF (congestive heart failure) (HCC) Acute on chronic bilateral lower extremity edema, which could be from poor ambulation, and venous stasis, otherwise with a stable white opacity, and chest x-ray is clear.   Last echo 05/2022 EF of 55 to 60% with grade 1 DD.  Not on diuretics. 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  She follows with urologist here in [place].  CT Abd and pelvis today unremarkable.  Migraine without aura and without status migrainosus, not intractable Home atogepant for migraine prophylaxis held for now, with renal insufficiency.  Generalized weakness Generalized weakness progressive over the past 6 months.  Now with  severe sepsis from UTI.  -PT eval appreciated recommends SNF rehab  Status is: Inpatient   Disposition: The patient is from: Home              Anticipated d/c is to: SNF              Anticipated d/c date is: 2 days              Patient currently is not medically stable to d/c. Barriers: Not Clinically Stable-   Code Status :  -  Code Status: Full Code   Family Communication:    NA (patient is alert, awake and coherent)   DVT Prophylaxis  :   - SCDs   heparin injection 5,000 Units Start: 09/02/22 0000  Lab Results  Component Value Date   PLT 189 09/02/2022   Inpatient Medications  Scheduled Meds:  aspirin EC  81 mg Oral Daily   Chlorhexidine Gluconate Cloth  6 each Topical Daily   gabapentin  100 mg Oral TID   heparin  5,000 Units Subcutaneous Q8H   midodrine  5 mg Oral TID WC   pantoprazole  40 mg Oral Daily   Continuous Infusions:  sodium chloride     sodium chloride 100 mL/hr at 09/03/22 1714   cefTRIAXone (ROCEPHIN)  IV 2 g (09/02/22 2123)   norepinephrine (LEVOPHED) Adult infusion     PRN Meds:.acetaminophen **OR** acetaminophen, ondansetron **OR** ondansetron (ZOFRAN) IV, polyethylene glycol, polyvinyl alcohol, traMADol   Anti-infectives (From admission, onward)    Start     Dose/Rate Route Frequency Ordered Stop   09/01/22 1945  cefTRIAXone (ROCEPHIN) 2 g in sodium chloride 0.9 % 100 mL IVPB  2 g 200 mL/hr over 30 Minutes Intravenous Every 24 hours 09/01/22 1931 October 08, 2022 1944      Subjective: Heather Garrett today has no fevers, no emesis,  No chest pain,   - Episode of hypotension requiring IV fluids -Patient states feels better after IV fluids  Objective: Vitals:   09/03/22 1500 09/03/22 1600 09/03/22 1622 09/03/22 1700  BP: 100/61 (!) 96/56  (!) 97/56  Pulse: 81 68 72 83  Resp: 11 (!) '7 11 10  '$ Temp:   (!) 97.4 F (36.3 C)   TempSrc:   Oral   SpO2: 100% 100% 100% 95%  Weight:      Height:        Intake/Output Summary (Last 24 hours)  at 09/03/2022 1819 Last data filed at 09/03/2022 0400 Gross per 24 hour  Intake 864.77 ml  Output --  Net 864.77 ml   Filed Weights   09/01/22 1419 09/02/22 1232 09/03/22 1149  Weight: 50.8 kg 55.8 kg 58.9 kg   Physical Exam  Gen:- Awake Alert,  in no apparent distress  HEENT:- [place].AT, No sclera icterus Neck-Supple Neck,No JVD,.  Lungs-  CTAB , fair symmetrical air movement CV- S1, S2 normal, regular  Abd-  +ve B.Sounds, Abd Soft, No tenderness, no CVA area tenderness Extremity/Skin:- No  edema, pedal pulses present  Psych-affect is appropriate, oriented x3 Neuro-generalized weakness, no new focal deficits, no tremors  Data Reviewed: I have personally reviewed following labs and imaging studies  CBC: Recent Labs  Lab 09/01/22 1520 09/02/22 0750  WBC 18.7* 11.2*  HGB 10.1* 7.4*  HCT 31.4* 23.2*  MCV 109.8* 110.5*  PLT 223 99991111   Basic Metabolic Panel: Recent Labs  Lab 09/01/22 1520 09/02/22 0605  NA 131* 133*  K 4.7 4.2  CL 97* 103  CO2 21* 21*  GLUCOSE 101* 72  BUN 20 19  CREATININE 1.67* 1.51*  CALCIUM 7.5* 7.3*   GFR: Estimated Creatinine Clearance: 24.1 mL/min (A) (by C-G formula based on SCr of 1.51 mg/dL (H)).  Recent Results (from the past 240 hour(s))  Resp panel by RT-PCR (RSV, Flu A&B, Covid) Anterior Nasal Swab     Status: None   Collection Time: 09/01/22  4:48 PM   Specimen: Anterior Nasal Swab  Result Value Ref Range Status   SARS Coronavirus 2 by RT PCR NEGATIVE NEGATIVE Final    Comment: (NOTE) SARS-CoV-2 target nucleic acids are NOT DETECTED.  The SARS-CoV-2 RNA is generally detectable in upper respiratory specimens during the acute phase of infection. The lowest concentration of SARS-CoV-2 viral copies this assay can detect is 138 copies/mL. A negative result does not preclude SARS-Cov-2 infection and should not be used as the sole basis for treatment or other patient management decisions. A negative result may occur with  improper  specimen collection/handling, submission of specimen other than nasopharyngeal swab, presence of viral mutation(s) within the areas targeted by this assay, and inadequate number of viral copies(<138 copies/mL). A negative result must be combined with clinical observations, patient history, and epidemiological information. The expected result is Negative.  Fact Sheet for Patients:  EntrepreneurPulse.com.au  Fact Sheet for Healthcare Providers:  IncredibleEmployment.be  This test is no t yet approved or cleared by the [place] FDA and  has been authorized for detection and/or diagnosis of SARS-CoV-2 by FDA under an Emergency Use Authorization (EUA). This EUA will remain  in effect (meaning this test can be used) for the duration of the COVID-19 declaration under Section 564(b)(1) of the Act,  21 U.S.C.section 360bbb-3(b)(1), unless the authorization is terminated  or revoked sooner.       Influenza A by PCR NEGATIVE NEGATIVE Final   Influenza B by PCR NEGATIVE NEGATIVE Final    Comment: (NOTE) The Xpert Xpress SARS-CoV-2/FLU/RSV plus assay is intended as an aid in the diagnosis of influenza from Nasopharyngeal swab specimens and should not be used as a sole basis for treatment. Nasal washings and aspirates are unacceptable for Xpert Xpress SARS-CoV-2/FLU/RSV testing.  Fact Sheet for Patients: EntrepreneurPulse.com.au  Fact Sheet for Healthcare Providers: IncredibleEmployment.be  This test is not yet approved or cleared by the [place] FDA and has been authorized for detection and/or diagnosis of SARS-CoV-2 by FDA under an Emergency Use Authorization (EUA). This EUA will remain in effect (meaning this test can be used) for the duration of the COVID-19 declaration under Section 564(b)(1) of the Act, 21 U.S.C. section 360bbb-3(b)(1), unless the authorization is terminated or revoked.     Resp  Syncytial Virus by PCR NEGATIVE NEGATIVE Final    Comment: (NOTE) Fact Sheet for Patients: EntrepreneurPulse.com.au  Fact Sheet for Healthcare Providers: IncredibleEmployment.be  This test is not yet approved or cleared by the [place] FDA and has been authorized for detection and/or diagnosis of SARS-CoV-2 by FDA under an Emergency Use Authorization (EUA). This EUA will remain in effect (meaning this test can be used) for the duration of the COVID-19 declaration under Section 564(b)(1) of the Act, 21 U.S.C. section 360bbb-3(b)(1), unless the authorization is terminated or revoked.  Performed at [facility], [address], [place], [place] [address]   Urine Culture     Status: Abnormal (Preliminary result)   Collection Time: 09/01/22  6:50 PM   Specimen: Urine, Clean Catch  Result Value Ref Range Status   Specimen Description   Final    URINE, CLEAN CATCH Performed at [facility], [address], [place], [place] [address]    Special Requests   Final    NONE Performed at [facility], [address], [place], [place] [address]    Culture (A)  Final    >=100,000 COLONIES/mL GRAM NEGATIVE RODS CULTURE REINCUBATED FOR BETTER GROWTH Performed at [facility], [place] [address] [place], [place] [address]    Report Status PENDING  Incomplete  Culture, blood (Routine X 2) w Reflex to ID Panel     Status: None (Preliminary result)   Collection Time: 09/02/22  6:05 AM   Specimen: BLOOD RIGHT HAND  Result Value Ref Range Status   Specimen Description   Final    BLOOD RIGHT HAND BOTTLES DRAWN AEROBIC AND ANAEROBIC   Special Requests Blood Culture adequate volume  Final   Culture   Final    NO GROWTH < 24 HOURS Performed at [facility], [address], [place], [place] [address]    Report Status PENDING  Incomplete  Culture, blood (Routine X 2) w Reflex to ID Panel     Status: None (Preliminary result)   Collection Time: 09/02/22  6:05  AM   Specimen: BLOOD LEFT HAND  Result Value Ref Range Status   Specimen Description   Final    BLOOD LEFT HAND BOTTLES DRAWN AEROBIC AND ANAEROBIC   Special Requests Blood Culture adequate volume  Final   Culture   Final    NO GROWTH < 24 HOURS Performed at [facility], [address] [place], [place] [address]    Report Status PENDING  Incomplete    Radiology Studies: DG Chest  Portable 1 View  Result Date: 09/01/2022 CLINICAL DATA:  Weakness over the past few weeks. Now unable to stand. EXAM: PORTABLE CHEST 1 VIEW COMPARISON:  08/17/2022 FINDINGS: Heart size and pulmonary vascularity are normal. Lungs are clear. No pleural effusions. No pneumothorax. Mediastinal contours appear intact. Calcified and tortuous aorta. Degenerative changes in the spine and shoulders. Surgical clips in the right upper quadrant. IMPRESSION: No active disease. Electronically Signed   By: Lucienne Capers M.D.   On: 09/01/2022 19:33    Scheduled Meds:  aspirin EC  81 mg Oral Daily   Chlorhexidine Gluconate Cloth  6 each Topical Daily   gabapentin  100 mg Oral TID   heparin  5,000 Units Subcutaneous Q8H   midodrine  5 mg Oral TID WC   pantoprazole  40 mg Oral Daily   Continuous Infusions:  sodium chloride     sodium chloride 100 mL/hr at 09/03/22 1714   cefTRIAXone (ROCEPHIN)  IV 2 g (09/02/22 2123)   norepinephrine (LEVOPHED) Adult infusion       LOS: 2 days   Roxan Hockey M.D on 09/03/2022 at 6:19 PM  Go to [email or web address] - for contact info  [facility] - Office  [phone]  If 7PM-7AM, please contact night-coverage [email or web address] 09/03/2022, 6:19 PM

## 2022-09-03 NOTE — Progress Notes (Signed)
   09/03/22 [id number]  ReDS Vest / Clip  Station Marker A  Ruler Value 22  ReDS Value Range < 36  ReDS Actual Value 26

## 2022-09-03 NOTE — Progress Notes (Signed)
Pt received 3.125 mg coreg at 9:35 now BP had drop to 80/60, pt asymptomatic. Current IV fluid are NS with 20 KCL. SRP RN

## 2022-09-03 NOTE — Progress Notes (Signed)
Patients B/P at 0912 was 70/41 manually, MD made aware. MD placed orders for one time bolus refer to mar for dosage. This nurse gave patient bolus. This nurse rechecked B/P after bolus reading was 75/54 pulse 73. MD made aware of reading after bolus. MD placed orders to transfer patient to stepdown and for patient to be placed on levophed drip refer to mar for dosage. At 1122 this nurse called ICU to see what bed the patient is assigned to and to give report to receiving nurse. At 1128 this nurse gave report to [place] in ICU, transferring patient at this time to ICU.

## 2022-09-04 DIAGNOSIS — R6520 Severe sepsis without septic shock: Secondary | ICD-10-CM | POA: Diagnosis not present

## 2022-09-04 DIAGNOSIS — A419 Sepsis, unspecified organism: Secondary | ICD-10-CM | POA: Diagnosis not present

## 2022-09-04 DIAGNOSIS — R1310 Dysphagia, unspecified: Secondary | ICD-10-CM

## 2022-09-04 LAB — CBC
HCT: 22.9 % — ABNORMAL LOW (ref 36.0–46.0)
Hemoglobin: 7.1 g/dL — ABNORMAL LOW (ref 12.0–15.0)
MCH: 35.7 pg — ABNORMAL HIGH (ref 26.0–34.0)
MCHC: 31 g/dL (ref 30.0–36.0)
MCV: 115.1 fL — ABNORMAL HIGH (ref 80.0–100.0)
Platelets: 137 10*3/uL — ABNORMAL LOW (ref 150–400)
RBC: 1.99 MIL/uL — ABNORMAL LOW (ref 3.87–5.11)
RDW: 17.2 % — ABNORMAL HIGH (ref 11.5–15.5)
WBC: 6.1 10*3/uL (ref 4.0–10.5)
nRBC: 0 % (ref 0.0–0.2)

## 2022-09-04 LAB — CORTISOL-AM, BLOOD: Cortisol - AM: 9 ug/dL (ref 6.7–22.6)

## 2022-09-04 LAB — MRSA NEXT GEN BY PCR, NASAL: MRSA by PCR Next Gen: DETECTED — AB

## 2022-09-04 LAB — ABO/RH: ABO/RH(D): O POS

## 2022-09-04 LAB — PREPARE RBC (CROSSMATCH)

## 2022-09-04 MED ORDER — MUPIROCIN 2 % EX OINT
1.0000 | TOPICAL_OINTMENT | Freq: Two times a day (BID) | CUTANEOUS | Status: DC
  Administered 2022-09-04 – 2022-09-07 (×6): 1 via NASAL
  Filled 2022-09-04 (×2): qty 22

## 2022-09-04 MED ORDER — SODIUM CHLORIDE 0.9% IV SOLUTION: Freq: Once | INTRAVENOUS | Status: AC

## 2022-09-04 NOTE — Progress Notes (Signed)
Pt remained hypotensive overnight but was able to sustain a MAP of 65 when aroused. When she rests BP drops. She c/o bilateral heel pain that was relieved with elevating heels off bed. Heels are red but no open wounds. This RN offered to place foam dressing to heels however, due to her fragile skin, she requests to hold off on that for now. Hemoglobin this AM is 7.1 which is down from 7.4 on 2/23. Little urinary output overnight (50cc) and she does not feel the urge to void. Denies lower abdominal tenderness or pressure upon palpation.  She continues to have edema to lower extremities from thighs down. Bryson Corona Edd Fabian

## 2022-09-04 NOTE — Progress Notes (Addendum)
PROGRESS NOTE  Heather Garrett, is a 72 y.o. female, DOB - 1951-02-04, [id number]  Admit date - 09/01/2022   Admitting Physician Delight Bickle Denton Brick, MD  Outpatient Primary MD for the patient is [name], [name]  LOS - 3  Chief Complaint  Patient presents with   Extremity Weakness      Brief Narrative:  72 y.o. female with medical history significant for bladder cancer, diastolic CHF, diabetes mellitus, hypertension, OSA.  Admitted with severe sepsis on 09/01/2022 presumably from urinary source -Patient prior urine culture from 08/2022 grew pansensitive E. coli and Proteus mirabilis    -Assessment and Plan: 1)Severe GNR Sepsis from urinary source--POA -Continue IV Rocephin pending further data from urine and blood cultures from 09/01/2021  2)AKI suspect due to dehydration/sepsis/UTI - renally adjust medications, avoid nephrotoxic agents / dehydration  / hypotension Admission creatinine 1.6, recent baseline between 0.8 and 1 -continue trending down with hydration  3)Hypotension -Hold Coreg Improved with IV fluids- -continue IV antibiotics - a.m. cortisol level is Not low  4)Chronic diastolic CHF (congestive heart failure) (HCC) Acute on chronic bilateral lower extremity edema, which could be from poor ambulation, and venous stasis, otherwise with a stable white opacity, and chest x-ray is clear.   Last echo 05/2022 EF of 55 to 60% with grade 1 DD.  Not on diuretics. -Gentle judicious hydration at this time due to #1 and #2 above  5) acute on chronic symptomatic anemia--Hgb down to 7.1 it was 10.1 on 09/01/2022 -No bleeding concerns noted -Suspect some hemodilution effect from IV fluids in setting of hypotension and AKI on admission -Patient with fatigue, dizziness, weakness and dyspnea on exertion and soft blood pressure as low as 84/51 overnight -- Transfuse 1 unit of PRBC on 09/04/2022 for symptomatic anemia  6)Dysphagia-----Pt has solid dysphagia.  -Patient had EGD in  Nov 2022 without any luminal narrowing to explain symptoms. Seems unusual that she would develop a stricture/stenosis so quickly, especially in the absence of GERD symptoms or esophagitis.  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 She is fairly high risk for colon cancer, but her functional status has declined recently; the benefits of further colon cancer screening may be outweighed by the risks. The patient would tentatively like to proceed with completing the colonoscopy.  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  She follows with urologist here in [place].  CT Abd and pelvis today unremarkable. -Concerns for gram-negative rod UTI as above #1  Migraine without aura and without status migrainosus, not intractable Home atogepant for migraine prophylaxis held for now, with renal insufficiency.  Generalized weakness Generalized weakness progressive over the past 6 months.  Now with severe sepsis from UTI.  -PT eval appreciated recommends SNF rehab  Status is: Inpatient   Disposition: The patient is from: Home              Anticipated d/c is to: SNF              Anticipated d/c date is: 2 days              Patient currently is not medically stable to d/c. Barriers: Not Clinically  Stable-   Code Status :  -  Code Status: Full Code   Family Communication:    NA (patient is alert, awake and coherent)   DVT Prophylaxis  :   - SCDs   heparin injection 5,000 Units Start: 09/02/22 0000  Lab Results  Component Value Date   PLT 137 (L) 09/04/2022   Inpatient Medications  Scheduled Meds:  aspirin EC  81 mg Oral Daily    Chlorhexidine Gluconate Cloth  6 each Topical Daily   gabapentin  100 mg Oral TID   heparin  5,000 Units Subcutaneous Q8H   midodrine  5 mg Oral TID WC   pantoprazole  40 mg Oral Daily   Continuous Infusions:  sodium chloride     cefTRIAXone (ROCEPHIN)  IV Stopped (09/03/22 1949)   norepinephrine (LEVOPHED) Adult infusion Stopped (09/03/22 2234)   PRN Meds:.acetaminophen **OR** acetaminophen, ondansetron **OR** ondansetron (ZOFRAN) IV, polyethylene glycol, polyvinyl alcohol, traMADol   Anti-infectives (From admission, onward)    Start     Dose/Rate Route Frequency Ordered Stop   09/01/22 1945  cefTRIAXone (ROCEPHIN) 2 g in sodium chloride 0.9 % 100 mL IVPB        2 g 200 mL/hr over 30 Minutes Intravenous Every 24 hours 09/01/22 1931 Sep 23, 2022 1944      Subjective: [name] today has no fevers, no emesis,  No chest pain,    fatigue, dizziness, weakness and dyspnea on exertion and soft blood pressure as low as 84/51 overnight-- -Hgb is down to 7.1 -No bleeding concerns - Swallowing difficulties persist  Objective: Vitals:   09/04/22 1630 09/04/22 1635 09/04/22 1700 09/04/22 1730  BP: (!) 89/63  (!) 84/53 113/74  Pulse: 62 62 85 93  Resp: '10 11 14 13  '$ Temp:  (!) 97.4 F (36.3 C)    TempSrc:  Oral    SpO2: 100% 99% 99% 100%  Weight:      Height:        Intake/Output Summary (Last 24 hours) at 09/04/2022 1757 Last data filed at 09/04/2022 1322 Gross per 24 hour  Intake 1683.33 ml  Output 50 ml  Net 1633.33 ml   Filed Weights   09/02/22 1232 09/03/22 1149 09/04/22 0648  Weight: 55.8 kg 58.9 kg 60.7 kg   Physical Exam  Gen:- Awake Alert,  in no apparent distress  HEENT:- [place].AT, No sclera icterus Neck-Supple Neck,No JVD,.  Lungs-  CTAB , fair symmetrical air movement CV- S1, S2 normal, regular  Abd-  +ve B.Sounds, Abd Soft, No tenderness, no CVA area tenderness Extremity/Skin:- No  edema, pedal pulses present  Psych-affect is appropriate, oriented  x3 Neuro-generalized weakness, no new focal deficits, no tremors  Data Reviewed: I have personally reviewed following labs and imaging studies  CBC: Recent Labs  Lab 09/01/22 1520 09/02/22 0750 09/04/22 0328  WBC 18.7* 11.2* 6.1  HGB 10.1* 7.4* 7.1*  HCT 31.4* 23.2* 22.9*  MCV 109.8* 110.5* 115.1*  PLT 223 189 0000000*   Basic Metabolic Panel: Recent Labs  Lab 09/01/22 1520 09/02/22 0605  NA 131* 133*  K 4.7 4.2  CL 97* 103  CO2 21* 21*  GLUCOSE 101* 72  BUN 20 19  CREATININE 1.67* 1.51*  CALCIUM 7.5* 7.3*   GFR: Estimated Creatinine Clearance: 24.5 mL/min (A) (by C-G formula based on SCr of 1.51 mg/dL (H)).  Recent Results (from the past 240 hour(s))  Resp panel by RT-PCR (RSV, Flu A&B, Covid) Anterior Nasal Swab     Status: None  Collection Time: 09/01/22  4:48 PM   Specimen: Anterior Nasal Swab  Result Value Ref Range Status   SARS Coronavirus 2 by RT PCR NEGATIVE NEGATIVE Final    Comment: (NOTE) SARS-CoV-2 target nucleic acids are NOT DETECTED.  The SARS-CoV-2 RNA is generally detectable in upper respiratory specimens during the acute phase of infection. The lowest concentration of SARS-CoV-2 viral copies this assay can detect is 138 copies/mL. A negative result does not preclude SARS-Cov-2 infection and should not be used as the sole basis for treatment or other patient management decisions. A negative result may occur with  improper specimen collection/handling, submission of specimen other than nasopharyngeal swab, presence of viral mutation(s) within the areas targeted by this assay, and inadequate number of viral copies(<138 copies/mL). A negative result must be combined with clinical observations, patient history, and epidemiological information. The expected result is Negative.  Fact Sheet for Patients:  EntrepreneurPulse.com.au  Fact Sheet for Healthcare Providers:  IncredibleEmployment.be  This test is no t  yet approved or cleared by the [place] FDA and  has been authorized for detection and/or diagnosis of SARS-CoV-2 by FDA under an Emergency Use Authorization (EUA). This EUA will remain  in effect (meaning this test can be used) for the duration of the COVID-19 declaration under Section 564(b)(1) of the Act, 21 U.S.C.section 360bbb-3(b)(1), unless the authorization is terminated  or revoked sooner.       Influenza A by PCR NEGATIVE NEGATIVE Final   Influenza B by PCR NEGATIVE NEGATIVE Final    Comment: (NOTE) The Xpert Xpress SARS-CoV-2/FLU/RSV plus assay is intended as an aid in the diagnosis of influenza from Nasopharyngeal swab specimens and should not be used as a sole basis for treatment. Nasal washings and aspirates are unacceptable for Xpert Xpress SARS-CoV-2/FLU/RSV testing.  Fact Sheet for Patients: EntrepreneurPulse.com.au  Fact Sheet for Healthcare Providers: IncredibleEmployment.be  This test is not yet approved or cleared by the [place] FDA and has been authorized for detection and/or diagnosis of SARS-CoV-2 by FDA under an Emergency Use Authorization (EUA). This EUA will remain in effect (meaning this test can be used) for the duration of the COVID-19 declaration under Section 564(b)(1) of the Act, 21 U.S.C. section 360bbb-3(b)(1), unless the authorization is terminated or revoked.     Resp Syncytial Virus by PCR NEGATIVE NEGATIVE Final    Comment: (NOTE) Fact Sheet for Patients: EntrepreneurPulse.com.au  Fact Sheet for Healthcare Providers: IncredibleEmployment.be  This test is not yet approved or cleared by the [place] FDA and has been authorized for detection and/or diagnosis of SARS-CoV-2 by FDA under an Emergency Use Authorization (EUA). This EUA will remain in effect (meaning this test can be used) for the duration of the COVID-19 declaration under Section 564(b)(1)  of the Act, 21 U.S.C. section 360bbb-3(b)(1), unless the authorization is terminated or revoked.  Performed at [facility], [address] [place], [place] [address]   Urine Culture     Status: Abnormal (Preliminary result)   Collection Time: 09/01/22  6:50 PM   Specimen: Urine, Clean Catch  Result Value Ref Range Status   Specimen Description   Final    URINE, CLEAN CATCH Performed at [facility], [address] [place], [place] [address]    Special Requests   Final    NONE Performed at [facility], [address], [place], [place] [address]    Culture (A)  Final    >=100,000 COLONIES/mL GRAM NEGATIVE RODS SUSCEPTIBILITIES TO FOLLOW Performed at [facility], [address] [place], [place] [address]    Report Status PENDING  Incomplete  Culture, blood (Routine X 2) w Reflex to ID Panel     Status: None (Preliminary result)   Collection Time: 09/02/22  6:05 AM   Specimen: BLOOD RIGHT HAND  Result Value Ref Range Status   Specimen Description   Final    BLOOD RIGHT HAND BOTTLES DRAWN AEROBIC AND ANAEROBIC   Special Requests Blood Culture adequate volume  Final   Culture   Final    NO GROWTH 2 DAYS Performed at [facility], [address], [place], [place] [address]    Report Status PENDING  Incomplete  Culture, blood (Routine X 2) w Reflex to ID Panel     Status: None (Preliminary result)   Collection Time: 09/02/22  6:05 AM   Specimen: BLOOD LEFT HAND  Result Value Ref Range Status   Specimen Description   Final    BLOOD LEFT HAND BOTTLES DRAWN AEROBIC AND ANAEROBIC   Special Requests Blood Culture adequate volume  Final   Culture   Final    NO GROWTH 2 DAYS Performed at [facility], [address] [place], [place] [address]    Report Status PENDING  Incomplete    Radiology Studies: No results found.  Scheduled Meds:  aspirin EC  81 mg Oral Daily   Chlorhexidine Gluconate Cloth  6 each Topical Daily   gabapentin  100 mg Oral TID   heparin  5,000  Units Subcutaneous Q8H   midodrine  5 mg Oral TID WC   pantoprazole  40 mg Oral Daily   Continuous Infusions:  sodium chloride     cefTRIAXone (ROCEPHIN)  IV Stopped (09/03/22 1949)   norepinephrine (LEVOPHED) Adult infusion Stopped (09/03/22 2234)    LOS: 3 days   Roxan Hockey M.D on 09/04/2022 at 5:57 PM  Go to [email or web address] - for contact info  [facility] - Office  [phone]  If 7PM-7AM, please contact night-coverage [email or web address] 09/04/2022, 5:57 PM

## 2022-09-04 NOTE — Plan of Care (Addendum)
Patient remains in SDU. Patient remains off of Levophed infusion as of 09/03/22 @ 2234 hours. No supplemental O2 requirement at this time.  Edit 2100 hrs: MRSA PCR ordered per "active surveillance"/MRSA screening standing orders and result is positive. Dr. Myna Hidalgo notified. MRSA positive standing orders initiated.    Problem: Education: Goal: Knowledge of General Education information will improve Description: Including pain rating scale, medication(s)/side effects and non-pharmacologic comfort measures Outcome: Progressing   Problem: Health Behavior/Discharge Planning: Goal: Ability to manage health-related needs will improve Outcome: Progressing   Problem: Clinical Measurements: Goal: Ability to maintain clinical measurements within normal limits will improve Outcome: Progressing Goal: Will remain free from infection Outcome: Progressing Goal: Diagnostic test results will improve Outcome: Progressing Goal: Respiratory complications will improve Outcome: Progressing Goal: Cardiovascular complication will be avoided Outcome: Progressing   Problem: Activity: Goal: Risk for activity intolerance will decrease Outcome: Progressing   Problem: Nutrition: Goal: Adequate nutrition will be maintained Outcome: Progressing   Problem: Coping: Goal: Level of anxiety will decrease Outcome: Progressing   Problem: Elimination: Goal: Will not experience complications related to bowel motility Outcome: Progressing Goal: Will not experience complications related to urinary retention Outcome: Progressing   Problem: Pain Managment: Goal: General experience of comfort will improve Outcome: Progressing   Problem: Safety: Goal: Ability to remain free from injury will improve Outcome: Progressing   Problem: Skin Integrity: Goal: Risk for impaired skin integrity will decrease Outcome: Progressing

## 2022-09-04 NOTE — Progress Notes (Signed)
   09/04/22 0754  ReDS Vest / Clip  Station Marker A  Ruler Value 22  ReDS Value Range < 36  ReDS Actual Value 31

## 2022-09-04 NOTE — Plan of Care (Signed)
  Problem: Education: Goal: Knowledge of General Education information will improve Description: Including pain rating scale, medication(s)/side effects and non-pharmacologic comfort measures Outcome: Progressing   Problem: Activity: Goal: Risk for activity intolerance will decrease Outcome: Progressing   Problem: Skin Integrity: Goal: Risk for impaired skin integrity will decrease Outcome: Progressing   

## 2022-09-05 ENCOUNTER — Inpatient Hospital Stay (HOSPITAL_COMMUNITY): Payer: Medicare PPO

## 2022-09-05 DIAGNOSIS — R1319 Other dysphagia: Secondary | ICD-10-CM | POA: Diagnosis not present

## 2022-09-05 DIAGNOSIS — K219 Gastro-esophageal reflux disease without esophagitis: Secondary | ICD-10-CM | POA: Diagnosis not present

## 2022-09-05 DIAGNOSIS — R933 Abnormal findings on diagnostic imaging of other parts of digestive tract: Secondary | ICD-10-CM | POA: Diagnosis not present

## 2022-09-05 DIAGNOSIS — R6520 Severe sepsis without septic shock: Secondary | ICD-10-CM | POA: Diagnosis not present

## 2022-09-05 DIAGNOSIS — A419 Sepsis, unspecified organism: Secondary | ICD-10-CM | POA: Diagnosis not present

## 2022-09-05 LAB — URINE CULTURE: Culture: >=100000 — AB

## 2022-09-05 LAB — CBC
HCT: 32.3 % — ABNORMAL LOW (ref 36.0–46.0)
Hemoglobin: 10 g/dL — ABNORMAL LOW (ref 12.0–15.0)
MCH: 30.9 pg (ref 26.0–34.0)
MCHC: 31 g/dL (ref 30.0–36.0)
MCV: 99.7 fL (ref 80.0–100.0)
Platelets: 174 10*3/uL (ref 150–400)
RBC: 3.24 MIL/uL — ABNORMAL LOW (ref 3.87–5.11)
RDW: 29.8 % — ABNORMAL HIGH (ref 11.5–15.5)
WBC: 10.1 10*3/uL (ref 4.0–10.5)
nRBC: 0 % (ref 0.0–0.2)

## 2022-09-05 LAB — BPAM RBC
Blood Product Expiration Date: 202403222359
ISSUE DATE / TIME: 202402251042
Unit Type and Rh: 5100

## 2022-09-05 LAB — TYPE AND SCREEN
ABO/RH(D): O POS
Antibody Screen: NEGATIVE
Unit division: 0

## 2022-09-05 LAB — BASIC METABOLIC PANEL
Anion gap: 11 (ref 5–15)
BUN: 17 mg/dL (ref 8–23)
CO2: 16 mmol/L — ABNORMAL LOW (ref 22–32)
Calcium: 7.7 mg/dL — ABNORMAL LOW (ref 8.9–10.3)
Chloride: 109 mmol/L (ref 98–111)
Creatinine, Ser: 1.44 mg/dL — ABNORMAL HIGH (ref 0.44–1.00)
GFR, Estimated: 39 mL/min — ABNORMAL LOW (ref >60)
Glucose, Bld: 77 mg/dL (ref 70–99)
Potassium: 3.9 mmol/L (ref 3.5–5.1)
Sodium: 136 mmol/L (ref 135–145)

## 2022-09-05 MED ORDER — CARVEDILOL 3.125 MG PO TABS
6.2500 mg | ORAL_TABLET | Freq: Two times a day (BID) | ORAL | Status: DC
  Administered 2022-09-05 – 2022-09-06 (×2): 6.25 mg via ORAL
  Filled 2022-09-05 (×3): qty 2

## 2022-09-05 MED ORDER — ORAL CARE MOUTH RINSE: 15.0000 mL | OROMUCOSAL | Status: DC | PRN

## 2022-09-05 MED ORDER — NAPHAZOLINE-PHENIRAMINE 0.025-0.3 % OP SOLN
1.0000 [drp] | Freq: Four times a day (QID) | OPHTHALMIC | Status: DC | PRN
  Administered 2022-09-06: 1 [drp] via OPHTHALMIC
  Filled 2022-09-05: qty 5

## 2022-09-05 MED ORDER — HEPARIN SODIUM (PORCINE) 5000 UNIT/ML IJ SOLN: 5000.0000 [IU] | Freq: Three times a day (TID) | INTRAMUSCULAR | Status: DC

## 2022-09-05 NOTE — Consult Note (Signed)
Gastroenterology Consult   Referring Provider: Dr. Roxan Hockey Primary Care Physician:  [name], [place] Primary Gastroenterologist:  Dr. Dustin Flock, [facility]  Patient ID: Heather Garrett; [id number]; Sep 24, 1950   Admit date: 09/01/2022  LOS: 4 days   Date of Consultation: 09/05/2022  Reason for Consultation:  Esophageal stricture   History of Present Illness   Heather Garrett is a 72 y.o. year old female with history of bladder cancer, CHF, DM, HTN, OSA, worsening functional decline due to lower extremity weakness, hepatic steatosis, admitted with severe GNR sepsis due to UTI, AKI, acute on chronic symptomatic anemia, and dysphagia. GI now consulted due to esophageal stricture seen on BPE.   BPE today with smooth tapered narrowing of the lower cervical/upper thoracic esophagus, obstructing a 12.5 mm tablet. Additional significant narrowing was seen at the GE junction but unable to assess patency as the tablet did not reach distal esophagus.    Hgb 7.1 yesterday, receiving 1 unit PRBCs. Iron studies done in early February with ferritin elevated at 493, sats greater than 85, Zinc low at 41, B6 deficient at 2.0. B12 greater than 2000. Heme negative.   She was seen by her primary GI last week with plans for outpatient BPE. She notes dysphagia for months to pills and solid food. On soft diet currently. No rectal bleeding. No unintentional weight loss. Takes Ibuprofen once to twice daily. Pantoprazole daily as outpatient.   She is wheelchair bound. Has had marked decline in functional status over the past year. Will be going to SNF for Rehab.    EGD 05/27/2021  enlarged gastric pouch with a hiatal hernia, but was otherwise unremarkable.  No marginal ulcer present.     Colonoscopy 05/27/2021 10 adenomatous polyps (at least 3 > 1 cm)   Past Medical History:  Diagnosis Date   Allergy    SEASONAL   Anemia    "YEAR IN HALF AGO "   Anxiety    Arthritis     OSTEOARTHRITIS   Biceps tendinitis    Bladder cancer ([place]) 2014   Breast cancer ([place]) 1990   Cataract    [place] 2 YEARS AGO UPDATED 05/03/22   Chronic diastolic CHF (congestive heart failure) (HCC)    Cobalamin deficiency    Coronary arteriosclerosis in native artery    Diabetes mellitus without complication ([place])    "YEARS AGO WHEN i WAS OVER WEIGHTBUT NOT NOW" UPDATED 05/03/22   Elevated liver function tests    GERD (gastroesophageal reflux disease)    "A LITTLE"   Heart murmur    Hiatal hernia    Hyperlipidemia    Hypertension    Migraine    Myocardial infarction ([place])    Rotator cuff syndrome    Sleep apnea    "YES BUT RESOLVED",UPDATED 05/03/22   Transitional cell carcinoma, 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[place])    Tumor    "ON KIDNEY,REMOVED NO PROBLEMS SINCE",2014,RIGHT KIDNEY   Vitamin D deficiency     Past Surgical History:  Procedure Laterality Date   BREAST SURGERY     CARDIAC CATHETERIZATION     CATARACT EXTRACTION     CHOLECYSTECTOMY  1979   DG  BONE DENSITY ([place] HX)     ENDOMETRIAL ABLATION     GASTRIC BYPASS     MASTECTOMY     OTHER SURGICAL HISTORY     CANCER SURGERY   TOTAL HIP ARTHROPLASTY Left 09/06/2021   Procedure: TOTAL HIP ARTHROPLASTY;  Surgeon: Willaim Sheng, MD;  Location: [facility];  Service: Orthopedics;  Laterality: Left;    Prior to Admission medications   Medication Sig Start Date End Date Taking? Authorizing Provider  alendronate (FOSAMAX) 70 MG tablet 70 mg once a week. 04/19/22  Yes [provider]  aspirin 81 MG EC tablet Take 1 tablet (81 mg total) by mouth every other day. Swallow whole.  Hold aspirin while taking eliquis, Patient taking differently: Take 81 mg by mouth daily. Swallow whole.  Hold aspirin while taking eliquis, 09/10/21  Yes Florencia Reasons, MD  Atogepant (QULIPTA) 60 MG TABS Take 1 tablet (60 mg total) by mouth daily. 08/11/22  Yes McCue, Janett Billow, NP  atorvastatin (LIPITOR) 40 MG tablet Take 40 mg by mouth daily. 05/20/22   Yes [provider]  carvedilol (COREG) 3.125 MG tablet Take 1 tablet (3.125 mg total) by mouth 2 (two) times daily. Hold if sbp less than 100 or heart rate less than 50 06/09/22  Yes Pemberton, Greer Ee, MD  celecoxib (CELEBREX) 100 MG capsule Take 1 capsule by mouth twice a day with meals Patient taking differently: Take 100 mg by mouth 2 (two) times daily. 06/09/22  Yes [name], FNP  ezetimibe (ZETIA) 10 MG tablet Take 1 tablet (10 mg total) by mouth daily. 05/20/22  Yes [name], FNP  famotidine (PEPCID) 40 MG tablet Take 1 tablet (40 mg total) by mouth as needed. 03/01/22  Yes [name], FNP  folic acid (FOLVITE) 1 MG tablet Take 1 tablet (1 mg total) by mouth daily. 12/03/21 12/03/22 Yes Florencia Reasons, MD  gabapentin (NEURONTIN) 100 MG capsule Take 1 capsule (100 mg total) by mouth 3 (three) times daily. 08/22/22  Yes Lyndal Pulley, MD  lidocaine (XYLOCAINE) 2 % solution Use as directed 5 mLs in the mouth or throat every 3 (three) hours as needed for mouth pain. 08/16/22  Yes Volney American, PA-C  meclizine (ANTIVERT) 12.5 MG tablet Take 1 tablet (12.5 mg total) by mouth 3 (three) times daily as needed for dizziness. 08/17/22  Yes Luvenia Heller, PA-C  pantoprazole (PROTONIX) 40 MG tablet Take 1 tablet (40 mg total) by mouth 2 (two) times daily before a meal. Patient taking differently: Take 40 mg by mouth daily. 06/07/22  Yes [name], FNP  Polyethylene Glycol 400 (BLINK TEARS) 0.25 % SOLN Place 1 drop into both eyes daily as needed (dry eyes).   Yes [provider]  simvastatin (ZOCOR) 20 MG tablet Take 1 tablet (20 mg total) by mouth at bedtime. 05/20/22  Yes [name], FNP  traMADol (ULTRAM) 50 MG tablet Take 50 mg by mouth every 8 (eight) hours as needed. 08/25/22  Yes [provider]  cephALEXin (KEFLEX) 500 MG capsule Take 500 mg by mouth 2 (two) times daily. Patient not taking: Reported on 09/01/2022    [provider]   ferrous sulfate 325 (65 FE) MG EC tablet Take 1 tablet (325 mg total) by mouth every Monday, Wednesday, and Friday. Patient not taking: Reported on 09/01/2022 09/10/21   Florencia Reasons, MD  meclizine (ANTIVERT) 25 MG tablet Take 12.5 mg by mouth 3 (three) times daily as needed. Patient not taking: Reported on 09/01/2022 08/17/22   [provider]  Na Sulfate-K Sulfate-Mg Sulf (SUPREP BOWEL PREP KIT) 17.5-3.13-1.6 GM/177ML SOLN Take 1 kit by mouth as directed. For colonoscopy prep 08/30/22   Daryel November, MD  nystatin-triamcinolone ointment [facility]) 1 Topical Daily Patient not taking: Reported on 09/01/2022 04/19/22   [provider]  sertraline (ZOLOFT) 100  MG tablet Take 1 tablet (100 mg total) by mouth daily. Patient not taking: Reported on 09/01/2022 03/11/22   [name], FNP  tiZANidine (ZANAFLEX) 4 MG tablet Take 1 tablet (4 mg total) by mouth at bedtime. Patient not taking: Reported on 09/01/2022 03/21/22   [name], FNP  trimethobenzamide (TIGAN) 300 MG capsule Take 1 capsule (300 mg total) by mouth 3 (three) times daily. Patient not taking: Reported on 09/01/2022 03/17/22   Renee Rival, FNP  Ubrogepant (UBRELVY) 100 MG TABS Take 100 mg by mouth daily as needed (migraine headaches). Can repeat x1 after 2 hours if needed. Max dose '200mg'$ /24 hours Patient not taking: Reported on 09/01/2022 05/02/22   Frann Rider, NP    Current Facility-Administered Medications  Medication Dose Route Frequency Provider Last Rate Last Admin   0.9 %  sodium chloride infusion  250 mL Intravenous Continuous Roxan Hockey, MD   Stopped at 09/05/22 1144   acetaminophen (TYLENOL) tablet 650 mg  650 mg Oral Q6H PRN Emokpae, Ejiroghene E, MD       Or   acetaminophen (TYLENOL) suppository 650 mg  650 mg Rectal Q6H PRN Emokpae, Ejiroghene E, MD       aspirin EC tablet 81 mg  81 mg Oral Daily Emokpae, Ejiroghene E, MD   81 mg at 09/05/22 0933   cefTRIAXone (ROCEPHIN) 2 g in sodium  chloride 0.9 % 100 mL IVPB  2 g Intravenous Q24H Emokpae, Ejiroghene E, MD   Stopped at 09/04/22 2004   Chlorhexidine Gluconate Cloth 2 % PADS 6 each  6 each Topical Daily Emokpae, Courage, MD   6 each at 09/05/22 1048   gabapentin (NEURONTIN) capsule 100 mg  100 mg Oral TID Emokpae, Ejiroghene E, MD   100 mg at 09/05/22 0933   heparin injection 5,000 Units  5,000 Units Subcutaneous Q8H Emokpae, Ejiroghene E, MD   5,000 Units at 09/05/22 0544   mupirocin ointment (BACTROBAN) 2 % 1 Application  1 Application Nasal BID Roxan Hockey, MD   1 Application at 0000000 0933   ondansetron (ZOFRAN) tablet 4 mg  4 mg Oral Q8H PRN Emokpae, Ejiroghene E, MD       Or   ondansetron (ZOFRAN) injection 4 mg  4 mg Intravenous Q8H PRN Emokpae, Ejiroghene E, MD       Oral care mouth rinse  15 mL Mouth Rinse PRN Emokpae, Courage, MD       pantoprazole (PROTONIX) EC tablet 40 mg  40 mg Oral Daily Emokpae, Ejiroghene E, MD   40 mg at 09/05/22 0933   polyethylene glycol (MIRALAX / GLYCOLAX) packet 17 g  17 g Oral Daily PRN Emokpae, Ejiroghene E, MD       polyvinyl alcohol (LIQUIFILM TEARS) 1.4 % ophthalmic solution 2 drop  2 drop Both Eyes PRN Emokpae, Courage, MD       traMADol (ULTRAM) tablet 50 mg  50 mg Oral Q8H PRN Emokpae, Ejiroghene E, MD   50 mg at 09/03/22 1456    Allergies as of 09/01/2022 - Review Complete 09/01/2022  Allergen Reaction Noted   Contrast media [iodinated contrast media] Anaphylaxis and Hives 11/24/2020   Ciprofloxacin Other (See Comments) 11/24/2020   Erythromycin base Nausea And Vomiting 11/24/2020   Sulfabenzamide Other (See Comments) 11/24/2020    Family History  Problem Relation Age of Onset   Congestive Heart Failure Mother    Coronary artery disease Father    Heart attack Father 69       cause of  death.   Diabetes Mellitus II Sister    Hypertension Sister    Breast cancer Sister    Migraines Neg Hx    Colon cancer Neg Hx    Esophageal cancer Neg Hx    Stomach cancer Neg  Hx    Colon polyps Neg Hx    Crohn's disease Neg Hx    Rectal cancer Neg Hx    Ulcerative colitis Neg Hx     Social History   Socioeconomic History   Marital status: Married    Spouse name: Not on file   Number of children: Not on file   Years of education: Not on file   Highest education level: Not on file  Occupational History   Not on file  Tobacco Use   Smoking status: Former    Passive exposure: Never   Smokeless tobacco: Never   Tobacco comments:    Quit 30 yrs ago  Vaping Use   Vaping Use: Never used  Substance and Sexual Activity   Alcohol use: Yes    Alcohol/week: 14.0 standard drinks of alcohol    Types: 14 Glasses of wine per week    Comment: daily, 2glasses wine daily   Drug use: Never   Sexual activity: Not on file  Other Topics Concern   Not on file  Social History Narrative   Lives with husband in town home.   Caffiene coffee 1 cup, occasional sweet tea (rare)      Social Determinants of Health   Financial Resource Strain: Not on file  Food Insecurity: No Food Insecurity (09/03/2022)   Hunger Vital Sign    Worried About Running Out of Food in the Last Year: Never true    Ran Out of Food in the Last Year: Never true  Transportation Needs: Unmet Transportation Needs (09/03/2022)   PRAPARE - Hydrologist (Medical): Yes    Lack of Transportation (Non-Medical): Yes  Physical Activity: Not on file  Stress: Not on file  Social Connections: Not on file  Intimate Partner Violence: Not At Risk (09/03/2022)   Humiliation, Afraid, Rape, and Kick questionnaire    Fear of Current or Ex-Partner: No    Emotionally Abused: No    Physically Abused: No    Sexually Abused: No     Review of Systems   Gen: Denies any fever, chills, loss of appetite, change in weight or weight loss CV: Denies chest pain, heart palpitations, syncope, edema  Resp: Denies shortness of breath with rest, cough, wheezing, coughing up blood, and  pleurisy. GI: see HPI GU : Denies urinary burning, blood in urine, urinary frequency, and urinary incontinence. MS: see HPI Derm: Denies rash, itching, dry skin, hives. Psych: Denies depression, anxiety, memory loss, hallucinations, and confusion. Heme: Denies bruising or bleeding Neuro:  Denies any headaches, dizziness, paresthesias, shaking  Physical Exam   Vital Signs in last 24 hours: Temp:  [96.8 F (36 C)-98.3 F (36.8 C)] 97.8 F (36.6 C) (02/26 1116) Pulse Rate:  [55-93] 80 (02/26 1300) Resp:  [7-15] 9 (02/26 1300) BP: (84-146)/(53-100) 142/86 (02/26 1300) SpO2:  [98 %-100 %] 99 % (02/26 1300) Weight:  [60.9 kg] 60.9 kg (02/26 0400) Last BM Date : 09/02/22  General:   Alert,  Well-developed, well-nourished, pleasant and cooperative in NAD Head:  Normocephalic and atraumatic. Eyes:  Sclera clear, no icterus.    Ears:  Normal auditory acuity. Lungs:  Clear throughout to auscultation.   Heart:  S1 S2 present without  murmurs Abdomen:  Soft, nontender and nondistended. No masses, hepatosplenomegaly or hernias noted. Normal bowel sounds, without guarding, and without rebound.   Rectal: deferred   Msk:  Symmetrical without gross deformities. Normal posture. Extremities:  With pedal and ankle edema Neurologic:  Alert and  oriented x4. Skin:  Intact without significant lesions or rashes. Psych:  Alert and cooperative. Normal mood and affect.  Intake/Output from previous day: 02/25 0701 - 02/26 0700 In: 889.5 [P.O.:120; I.V.:359.4; Blood:310; IV Piggyback:100.1] Out: 120 [Urine:120] Intake/Output this shift: Total I/O In: 47.4 [I.V.:47.4] Out: -     Labs/Studies   Recent Labs Recent Labs    09/04/22 0328 09/05/22 0411  WBC 6.1 10.1  HGB 7.1* 10.0*  HCT 22.9* 32.3*  PLT 137* 174   BMET Recent Labs    09/05/22 0411  NA 136  K 3.9  CL 109  CO2 16*  GLUCOSE 77  BUN 17  CREATININE 1.44*  CALCIUM 7.7*     Radiology/Studies DG ESOPHAGUS W SINGLE CM  (SOL OR THIN BA)  Result Date: 09/05/2022 CLINICAL DATA:  Lower cervical/upper thoracic dysphagia for solids and pills EXAM: ESOPHOGRAM/BARIUM SWALLOW TECHNIQUE: Single contrast examination was performed using thin barium. A swallow of thick barium was also utilized. Patient swallowed a 12.5 mm diameter barium tablet as well. FLUOROSCOPY: Radiation Exposure Index (as provided by the fluoroscopic device): 50.3 mGy Kerma COMPARISON:  None Available. FINDINGS: Esophageal distention: Tapered narrowing at the lower cervical/upper thoracic esophagus. An additional area of narrowing is noted at the GE junction. Filling defects:  None 12.5 mm barium tablet: Transiently delayed at the vallecula. Tablet obstructed at the tapered area of narrowing at the lower cervical/upper thoracic esophagus. Findings are consistent with a stricture. 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 Positive spontaneous cough reflex. Hiatal hernia: Prior gastric bypass surgery. Small gastric remnant, partially above the diaphragm GE reflux:  Not witnessed during exam Other:  N/A IMPRESSION: Esophageal dysmotility. Single episode of laryngeal penetration and aspiration with positive cough reflex. Smooth tapered narrowing of the lower cervical/upper thoracic esophagus, obstructing a 12.5 mm diameter barium tablet, consistent with stricture, favor benign stricture. Additional significant narrowing is seen at the GE junction though due to proximal stricture, unable to assess patency of the GE junction due to 12.5 mm diameter barium tablet being unable to reach the distal esophagus. Electronically Signed   By: Lavonia Dana M.D.   On: 09/05/2022 12:53     Assessment   Riann Creppel is a 72 y.o. year old female  with  history of bladder cancer, CHF, DM, HTN, OSA, worsening functional decline due to lower extremity weakness, hepatic steatosis, admitted with severe GNR sepsis due to UTI, AKI, acute on chronic symptomatic anemia, and dysphagia. GI now consulted due to esophageal stricture seen on BPE.   Dysphagia has been noted now for several months to pills and solid foods. BPE today with smooth tapered narrowing of the lower cervical/upper thoracic esophagus, obstructing a 12.5 mm tablet. Additional significant narrowing was seen at the GE junction but unable to assess patency as the tablet did not reach distal esophagus. Plans had been for outpatient EGD/dilation with primary GI. Will pursue this as inpatient.   Acute on chronic anemia: Hgb 7.1 yesterday, receiving 1 unit PRBCs. Iron studies done in early February with ferritin elevated at 493,  sats greater than 85, Zinc low at 41, B6 deficient at 2.0. B12 greater than 2000. Heme negative. EGD as planned for dysphagia. Will need outpatient colonoscopy with primary GI due to history of multiple polyps in 2022.       Plan / Recommendations    Soft diet NPO after midnight Hold Heparin dose this evening and tomorrow morning Continue PPI daily EGD with dilation tomorrow by Dr. Jenetta Downer. Colonoscopy as outpatient      09/05/2022, 1:33 PM  Annitta Needs, PhD, [facility] Gastroenter[facility]

## 2022-09-05 NOTE — Progress Notes (Signed)
PROGRESS NOTE  Heather Garrett, is a 72 y.o. female, DOB - 10/05/1950, [id number]  Admit date - 09/01/2022   Admitting Physician Demetruis Depaul Denton Brick, MD  Outpatient Primary MD for the patient is [name], [place]  LOS - 4  Chief Complaint  Patient presents with   Extremity Weakness      Brief Narrative:  72 y.o. female with medical history significant for bladder cancer, diastolic CHF, diabetes mellitus, hypertension, OSA.  Admitted with severe sepsis on 09/01/2022 presumably from urinary source -Patient prior urine culture from 08/2022 grew pansensitive E. coli and Proteus mirabilis    -Assessment and Plan: 1)Severe E. coli and Proteus sepsis from urinary source--POA -Urine culture from 09/01/2020 with E. coli and Proteus Simas urine culture from 08/16/2022 -Continue IV Rocephin -Anticipate discharge on cefadroxil 500 bid   2)AKI suspect due to dehydration/sepsis/UTI - renally adjust medications, avoid nephrotoxic agents / dehydration  / hypotension Admission creatinine 1.6, recent baseline between 0.8 and 1 -continue trending down with hydration  3)Hypotension -Hold Coreg Improved with transfusion of PRBC and IV fluids- -continue IV antibiotics - a.m. cortisol level is Not low  4)Chronic diastolic CHF (congestive heart failure) (HCC) Acute on chronic bilateral lower extremity edema, which could be from poor ambulation, and venous stasis, otherwise with a stable white opacity, and chest x-ray is clear.   Last echo 05/2022 EF of 55 to 60% with grade 1 DD.  Not on diuretics. -Gentle judicious hydration at this time due to #1 and #2 above  5) acute on chronic symptomatic anemia-- Hgb is back to 10 from 7.1 after transfusion of 1 unit of PRBC on 09/04/2022 -No bleeding concerns noted -Suspect some hemodilution effect from IV fluids in setting of hypotension and AKI on admission - 6)Dysphagia-----Pt has solid dysphagia.  -Patient had EGD in Nov 2022 without any luminal  narrowing to explain symptoms. Seems unusual that she would develop a stricture/stenosis so quickly, especially in the absence of GERD symptoms or esophagitis.  09/05/22 -Barium swallow/esophagram on 09/05/2022 shows smooth tapered narrowing of the lower cervical/upper thoracic esophagus, obstructing a 12.5 mm tablet. Additional significant narrowing was seen at the GE junction but unable to assess patency as the tablet did not reach distal esophagus.  Prior EGD 05/27/2021  showed enlarged gastric pouch with a hiatal hernia, but was otherwise unremarkable.  No marginal ulcer present.   -Plan is for EGD on 09/06/2022 as inpatient given extensive comorbidities that would make it difficult to do outpatient EGD also patient having significant difficulty with oral intake unable to discharge home without trying to address her significant dysphagia - the patient had 10 adenomas in Nov 2022, including 3 polyps > 1cm. She is fairly high risk for colon cancer, but her functional status has declined recently; the benefits of further colon cancer screening may be outweighed by the risks. The patient would tentatively like to proceed with completing the colonoscopy.  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  She follows with urologist here in [place].  CT Abd and pelvis today unremarkable. -Patient with recurrent  gram-negative rod UTI as above #1  Migraine without aura and without status migrainosus, not intractable Home atogepant for migraine prophylaxis held for now, with renal insufficiency.  Generalized weakness Generalized weakness progressive over the  past 6 months.  Now with severe sepsis from UTI.  -PT eval appreciated recommends SNF rehab  Status  is: Inpatient   Disposition: The patient is from: Home              Anticipated d/c is to: SNF              Anticipated d/c date is: 2 days              Patient currently is not medically stable to d/c. Barriers: Not Clinically Stable-   Code Status :  -  Code Status: Full Code   Family Communication:    Discussed with daughter and husband at bedside  DVT Prophylaxis  :   - SCDs     Lab Results  Component Value Date   PLT 174 09/05/2022   Inpatient Medications  Scheduled Meds:  aspirin EC  81 mg Oral Daily   Chlorhexidine Gluconate Cloth  6 each Topical Daily   gabapentin  100 mg Oral TID   mupirocin ointment  1 Application Nasal BID   pantoprazole  40 mg Oral Daily   Continuous Infusions:  sodium chloride Stopped (09/05/22 1144)   cefTRIAXone (ROCEPHIN)  IV Stopped (09/04/22 2004)   PRN Meds:.acetaminophen **OR** acetaminophen, ondansetron **OR** ondansetron (ZOFRAN) IV, mouth rinse, polyethylene glycol, polyvinyl alcohol, traMADol   Anti-infectives (From admission, onward)    Start     Dose/Rate Route Frequency Ordered Stop   09/01/22 1945  cefTRIAXone (ROCEPHIN) 2 g in sodium chloride 0.9 % 100 mL IVPB        2 g 200 mL/hr over 30 Minutes Intravenous Every 24 hours 09/01/22 1931 09-13-22 1944      Subjective: Heather Garrett today has no fevers, no emesis,  No chest pain,    -Daughter and husband at bedside, questions answered - Swallowing difficulties persist  Objective: Vitals:   09/05/22 1500 09/05/22 1600 09/05/22 1644 09/05/22 1700  BP: (!) 148/81 125/72  (!) 153/88  Pulse: 75 72  75  Resp: (!) 9 (!) 8  13  Temp:   97.6 F (36.4 C)   TempSrc:   Oral   SpO2: 100% 100%  98%  Weight:      Height:        Intake/Output Summary (Last 24 hours) at 09/05/2022 1823 Last data filed at 09/05/2022 1200 Gross per 24 hour  Intake 256.87 ml  Output 120 ml  Net 136.87 ml   Filed Weights   09/03/22 1149 09/04/22 0648 09/05/22 0400  Weight: 58.9 kg 60.7  kg 60.9 kg   Physical Exam  Gen:- Awake Alert,  in no apparent distress  HEENT:- [place].AT, No sclera icterus Neck-Supple Neck,No JVD,.  Lungs-  CTAB , fair symmetrical air movement CV- S1, S2 normal, regular  Abd-  +ve B.Sounds, Abd Soft, No tenderness, no CVA area tenderness Extremity/Skin:- No  edema, pedal pulses present  Psych-affect is appropriate, oriented x3 Neuro-generalized weakness, no new focal deficits, no tremors  Data Reviewed: I have personally reviewed following labs and imaging studies  CBC: Recent Labs  Lab 09/01/22 1520 09/02/22 0750 09/04/22 0328 09/05/22 0411  WBC 18.7* 11.2* 6.1 10.1  HGB 10.1* 7.4* 7.1* 10.0*  HCT 31.4* 23.2* 22.9* 32.3*  MCV 109.8* 110.5* 115.1* 99.7  PLT 223 189 137* [id number]   Basic Metabolic Panel: Recent Labs  Lab 09/01/22 1520 09/02/22 0605 09/05/22 0411  NA 131* 133* 136  K 4.7 4.2 3.9  CL 97* 103 109  CO2 21*  21* 16*  GLUCOSE 101* 72 77  BUN '20 19 17  '$ CREATININE 1.67* 1.51* 1.44*  CALCIUM 7.5* 7.3* 7.7*   GFR: Estimated Creatinine Clearance: 25.7 mL/min (A) (by C-G formula based on SCr of 1.44 mg/dL (H)).  Recent Results (from the past 240 hour(s))  Resp panel by RT-PCR (RSV, Flu A&B, Covid) Anterior Nasal Swab     Status: None   Collection Time: 09/01/22  4:48 PM   Specimen: Anterior Nasal Swab  Result Value Ref Range Status   SARS Coronavirus 2 by RT PCR NEGATIVE NEGATIVE Final    Comment: (NOTE) SARS-CoV-2 target nucleic acids are NOT DETECTED.  The SARS-CoV-2 RNA is generally detectable in upper respiratory specimens during the acute phase of infection. The lowest concentration of SARS-CoV-2 viral copies this assay can detect is 138 copies/mL. A negative result does not preclude SARS-Cov-2 infection and should not be used as the sole basis for treatment or other patient management decisions. A negative result may occur with  improper specimen collection/handling, submission of specimen other than nasopharyngeal  swab, presence of viral mutation(s) within the areas targeted by this assay, and inadequate number of viral copies(<138 copies/mL). A negative result must be combined with clinical observations, patient history, and epidemiological information. The expected result is Negative.  Fact Sheet for Patients:  EntrepreneurPulse.com.au  Fact Sheet for Healthcare Providers:  IncredibleEmployment.be  This test is no t yet approved or cleared by the [place] FDA and  has been authorized for detection and/or diagnosis of SARS-CoV-2 by FDA under an Emergency Use Authorization (EUA). This EUA will remain  in effect (meaning this test can be used) for the duration of the COVID-19 declaration under Section 564(b)(1) of the Act, 21 U.S.C.section 360bbb-3(b)(1), unless the authorization is terminated  or revoked sooner.       Influenza A by PCR NEGATIVE NEGATIVE Final   Influenza B by PCR NEGATIVE NEGATIVE Final    Comment: (NOTE) The Xpert Xpress SARS-CoV-2/FLU/RSV plus assay is intended as an aid in the diagnosis of influenza from Nasopharyngeal swab specimens and should not be used as a sole basis for treatment. Nasal washings and aspirates are unacceptable for Xpert Xpress SARS-CoV-2/FLU/RSV testing.  Fact Sheet for Patients: EntrepreneurPulse.com.au  Fact Sheet for Healthcare Providers: IncredibleEmployment.be  This test is not yet approved or cleared by the [place] FDA and has been authorized for detection and/or diagnosis of SARS-CoV-2 by FDA under an Emergency Use Authorization (EUA). This EUA will remain in effect (meaning this test can be used) for the duration of the COVID-19 declaration under Section 564(b)(1) of the Act, 21 U.S.C. section 360bbb-3(b)(1), unless the authorization is terminated or revoked.     Resp Syncytial Virus by PCR NEGATIVE NEGATIVE Final    Comment: (NOTE) Fact Sheet for  Patients: EntrepreneurPulse.com.au  Fact Sheet for Healthcare Providers: IncredibleEmployment.be  This test is not yet approved or cleared by the [place] FDA and has been authorized for detection and/or diagnosis of SARS-CoV-2 by FDA under an Emergency Use Authorization (EUA). This EUA will remain in effect (meaning this test can be used) for the duration of the COVID-19 declaration under Section 564(b)(1) of the Act, 21 U.S.C. section 360bbb-3(b)(1), unless the authorization is terminated or revoked.  Performed at [facility], [address], [place], [place] [address]   Urine Culture     Status: Abnormal   Collection Time: 09/01/22  6:50 PM   Specimen: Urine, Clean Catch  Result Value Ref Range Status   Specimen  Description   Final    URINE, CLEAN CATCH Performed at [facility], [address], [place], [place] [address]    Special Requests   Final    NONE Performed at [facility], [address], [place], [place] [address]    Culture (A)  Final    >=100,000 COLONIES/mL ESCHERICHIA COLI >=100,000 COLONIES/mL PROTEUS MIRABILIS    Report Status 09/05/2022 FINAL  Final   Organism ID, Bacteria ESCHERICHIA COLI (A)  Final   Organism ID, Bacteria PROTEUS MIRABILIS (A)  Final      Susceptibility   Escherichia coli - MIC*    AMPICILLIN <=2 SENSITIVE Sensitive     CEFAZOLIN <=4 SENSITIVE Sensitive     CEFEPIME <=0.12 SENSITIVE Sensitive     CEFTRIAXONE <=0.25 SENSITIVE Sensitive     CIPROFLOXACIN <=0.25 SENSITIVE Sensitive     GENTAMICIN <=1 SENSITIVE Sensitive     IMIPENEM <=0.25 SENSITIVE Sensitive     NITROFURANTOIN <=16 SENSITIVE Sensitive     TRIMETH/SULFA <=20 SENSITIVE Sensitive     AMPICILLIN/SULBACTAM <=2 SENSITIVE Sensitive     PIP/TAZO <=4 SENSITIVE Sensitive     * >=100,000 COLONIES/mL ESCHERICHIA COLI   Proteus mirabilis - MIC*    AMPICILLIN <=2 SENSITIVE Sensitive     CEFAZOLIN 8 SENSITIVE Sensitive     CEFEPIME  <=0.12 SENSITIVE Sensitive     CEFTRIAXONE <=0.25 SENSITIVE Sensitive     CIPROFLOXACIN <=0.25 SENSITIVE Sensitive     GENTAMICIN <=1 SENSITIVE Sensitive     IMIPENEM 2 SENSITIVE Sensitive     NITROFURANTOIN 128 RESISTANT Resistant     TRIMETH/SULFA <=20 SENSITIVE Sensitive     AMPICILLIN/SULBACTAM <=2 SENSITIVE Sensitive     PIP/TAZO <=4 SENSITIVE Sensitive     * >=100,000 COLONIES/mL PROTEUS MIRABILIS  Culture, blood (Routine X 2) w Reflex to ID Panel     Status: None (Preliminary result)   Collection Time: 09/02/22  6:05 AM   Specimen: BLOOD RIGHT HAND  Result Value Ref Range Status   Specimen Description   Final    BLOOD RIGHT HAND BOTTLES DRAWN AEROBIC AND ANAEROBIC   Special Requests Blood Culture adequate volume  Final   Culture   Final    NO GROWTH 3 DAYS Performed at [facility], [address] [place], [place] [address]    Report Status PENDING  Incomplete  Culture, blood (Routine X 2) w Reflex to ID Panel     Status: None (Preliminary result)   Collection Time: 09/02/22  6:05 AM   Specimen: BLOOD LEFT HAND  Result Value Ref Range Status   Specimen Description   Final    BLOOD LEFT HAND BOTTLES DRAWN AEROBIC AND ANAEROBIC   Special Requests Blood Culture adequate volume  Final   Culture   Final    NO GROWTH 3 DAYS Performed at [facility], [address], [place], [place] [address]    Report Status PENDING  Incomplete  MRSA Next Gen by PCR, Nasal     Status: Abnormal   Collection Time: 09/04/22  7:24 PM   Specimen: Nasal Mucosa; Nasal Swab  Result Value Ref Range Status   MRSA by PCR Next Gen DETECTED (A) NOT DETECTED Final    Comment: RESULT CALLED TO, READ BACK BY AND VERIFIED WITH: BSEPTARAI,S ON 09/04/22 @ 2055 BY ACOSTA,A Performed at [facility], [address], [place], [place] [address]     Radiology Studies: DG ESOPHAGUS W SINGLE CM (SOL OR THIN BA)  Result Date: 09/05/2022 CLINICAL DATA:  Lower  cervical/upper thoracic dysphagia for solids and pills  EXAM: ESOPHOGRAM/BARIUM SWALLOW TECHNIQUE: Single contrast examination was performed using thin barium. A swallow of thick barium was also utilized. Patient swallowed a 12.5 mm diameter barium tablet as well. FLUOROSCOPY: Radiation Exposure Index (as provided by the fluoroscopic device): 50.3 mGy Kerma COMPARISON:  None Available. FINDINGS: Esophageal distention: Tapered narrowing at the lower cervical/upper thoracic esophagus. An additional area of narrowing is noted at the GE junction. Filling defects:  None 12.5 mm barium tablet: Transiently delayed at the vallecula. Tablet obstructed at the tapered area of narrowing at the lower cervical/upper thoracic esophagus. Findings are consistent with a stricture. 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 Positive spontaneous cough reflex. Hiatal hernia: Prior gastric bypass surgery. Small gastric remnant, partially above the diaphragm GE reflux:  Not witnessed during exam Other:  N/A IMPRESSION: Esophageal dysmotility. Single episode of laryngeal penetration and aspiration with positive cough reflex. Smooth tapered narrowing of the lower cervical/upper thoracic esophagus, obstructing a 12.5 mm diameter barium tablet, consistent with stricture, favor benign stricture. Additional significant narrowing is seen at the GE junction though due to proximal stricture, unable to assess patency of the GE junction due to 12.5 mm diameter barium tablet being unable to reach the distal esophagus. Electronically Signed   By: Lavonia Dana M.D.   On: 09/05/2022 12:53    Scheduled Meds:  aspirin EC  81 mg Oral Daily   Chlorhexidine Gluconate Cloth  6 each Topical Daily   gabapentin  100 mg Oral TID   mupirocin ointment  1 Application Nasal BID    pantoprazole  40 mg Oral Daily   Continuous Infusions:  sodium chloride Stopped (09/05/22 1144)   cefTRIAXone (ROCEPHIN)  IV Stopped (09/04/22 2004)    LOS: 4 days   Roxan Hockey M.D on 09/05/2022 at 6:23 PM  Go to [email or web address] - for contact info  [facility] - Office  [phone]  If 7PM-7AM, please contact night-coverage [email or web address] 09/05/2022, 6:23 PM

## 2022-09-05 NOTE — Progress Notes (Signed)
Physical Therapy Treatment Patient Details Name: Heather Garrett MRN: [id number] DOB: [birth year] Today's Date: 09/05/2022   History of Present Illness Heather Garrett is a 72 y.o. female with medical history significant for bladder cancer, diastolic CHF, diabetes mellitus, hypertension, OSA.  Patient was brought to the ED reports of generalized weakness, with dizziness ongoing over the past 6 months, but significantly worse over the past week.  She has baseline vertigo, but her dizziness is worse.  She is unable to ambulate due to generalized weakness and dizziness. She reports pain with urination that started today, she also reports lower abdominal pain. She reports poor oral intake, vomiting no loose stools.  He also reports worsening swelling to her bilateral lower extremities over the past week.  1 episode of fall when she slid off her recliner and her neighbors helped her up, otherwise no falls.  She did not lose consciousness.  She did not hit her head.    No fever no chills no chest pain no difficulty breathing.    PT Comments    REASSESSMENT: Patient demonstrates slow labored movement for sitting up at bedside with most difficulty moving her legs due to weakness and pain with pressure.  Patient unable to stand using RW due to knees buckling due to weakness and required Max assist stand pivot with left knee blocked to transfer to chair.  Patient tolerated sitting up in chair after therapy - RN notified.  Patient will benefit from continued skilled physical therapy in hospital and recommended venue below to increase strength, balance, endurance for safe ADLs and gait.     Recommendations for follow up therapy are one component of a multi-disciplinary discharge planning process, led by the attending physician.  Recommendations may be updated based on patient status, additional functional criteria and insurance authorization.  Follow Up Recommendations  Skilled nursing-short term rehab  (<3 hours/day) Can patient physically be transported by private vehicle: No   Assistance Recommended at Discharge Set up Supervision/Assistance  Patient can return home with the following A lot of help with bathing/dressing/bathroom;A lot of help with walking and/or transfers;Help with stairs or ramp for entrance;Assistance with cooking/housework   Equipment Recommendations  None recommended by PT    Recommendations for Other Services       Precautions / Restrictions Precautions Precautions: Fall Restrictions Weight Bearing Restrictions: No     Mobility  Bed Mobility Overal bed mobility: Needs Assistance Bed Mobility: Supine to Sit     Supine to sit: Mod assist, Max assist     General bed mobility comments: increased time, labored movement    Transfers Overall transfer level: Needs assistance Equipment used: 1 person hand held assist Transfers: Sit to/from Stand, Bed to chair/wheelchair/BSC Sit to Stand: Max assist Stand pivot transfers: Max assist         General transfer comment: unable to maintain stand balance using RW, required Max assist stand pivot to transfer to chair    Ambulation/Gait                   Stairs             Wheelchair Mobility    Modified Rankin (Stroke Patients Only)       Balance Overall balance assessment: Needs assistance Sitting-balance support: Feet supported, No upper extremity supported Sitting balance-Leahy Scale: Fair Sitting balance - Comments: seated at EOB  Cognition Arousal/Alertness: Awake/alert Behavior During Therapy: WFL for tasks assessed/performed Overall Cognitive Status: Within Functional Limits for tasks assessed                                          Exercises      General Comments        Pertinent Vitals/Pain Pain Assessment Pain Assessment: Faces Faces Pain Scale: Hurts even more Pain Location: BUE, BLE  with pressure to skin due to fragile skin Pain Descriptors / Indicators: Sore, Discomfort, Grimacing, Guarding Pain Intervention(s): Limited activity within patient's tolerance, Monitored during session, Repositioned    Home Living                          Prior Function            PT Goals (current goals can now be found in the care plan section) Acute Rehab PT Goals Patient Stated Goal: return home after rehab PT Goal Formulation: With patient Time For Goal Achievement: 09/16/22 Potential to Achieve Goals: Good Progress towards PT goals: Progressing toward goals    Frequency    Min 3X/week      PT Plan Current plan remains appropriate    Co-evaluation              AM-PAC PT "6 Clicks" Mobility   Outcome Measure  Help needed turning from your back to your side while in a flat bed without using bedrails?: A Lot Help needed moving from lying on your back to sitting on the side of a flat bed without using bedrails?: A Lot Help needed moving to and from a bed to a chair (including a wheelchair)?: A Lot Help needed standing up from a chair using your arms (e.g., wheelchair or bedside chair)?: A Lot Help needed to walk in hospital room?: Total Help needed climbing 3-5 steps with a railing? : Total 6 Click Score: 10    End of Session   Activity Tolerance: Patient tolerated treatment well;Patient limited by fatigue Patient left: in chair;with call bell/phone within reach Nurse Communication: Mobility status PT Visit Diagnosis: Unsteadiness on feet (R26.81);Other abnormalities of gait and mobility (R26.89);Muscle weakness (generalized) (M62.81)     Time: 1020-1040 PT Time Calculation (min) (ACUTE ONLY): 20 min  Charges:  $Therapeutic Activity: 8-22 mins                     2:33 PM, 09/05/22 Lonell Grandchild, MPT Physical Therapist with [facility] [phone] office [phone] mobile phone

## 2022-09-05 NOTE — TOC Progression Note (Addendum)
Transition of Care [facility]) - Progression Note    Patient Details  Name: KLARITY HOELZLE MRN: [id number] Date of Birth: [birth year]  Transition of Care [facility]) CM/SW Contact  Shade Flood, LCSW Phone Number: 09/05/2022, 1:11 PM  Clinical Narrative:     TOC following. Awaiting [facility] decision on bed offer for rehab. Per MD, pt needs GI consult. DC date not yet known. Pt does have auth for SNF with expiration being tomorrow.   Assigned TOC will follow up in AM.   1320: [facility] did offer SNF bed. Met with pt and her husband to provide bed offers. They select [facility]. Updated [facility].  Expected Discharge Plan: [place] Barriers to Discharge: Continued Medical Work up  Expected Discharge Plan and Services       Living arrangements for the past 2 months: Single Family Home                                       Social Determinants of Health (SDOH) Interventions SDOH Screenings   Food Insecurity: No Food Insecurity (09/03/2022)  Housing: Low Risk  (09/03/2022)  Transportation Needs: Unmet Transportation Needs (09/03/2022)  Utilities: At Risk (09/03/2022)  Depression (PHQ2-9): Medium Risk (08/29/2022)  Tobacco Use: Medium Risk (09/01/2022)    Readmission Risk Interventions    12/13/2021    3:40 PM  Readmission Risk Prevention Plan  Transportation Screening Complete  Medication Review ([place]) Complete  PCP or Specialist appointment within 3-5 days of discharge Complete  HRI or [place] Complete  SW Recovery Care/Counseling Consult Complete  Palliative Care Screening Complete  Skilled Nursing Facility Complete

## 2022-09-05 NOTE — Progress Notes (Signed)
No urine output since 7a. Pt started on full liquid diet at lunch after several hours of being NPO. Bladderscan =174. MD aware.

## 2022-09-05 NOTE — Progress Notes (Signed)
SLP Cancellation Note  Patient Details Name: Heather Garrett MRN: [id number] DOB: 1951-05-30   Cancelled treatment:       Reason Eval/Treat Not Completed: Patient at procedure or test/unavailable. Pt is NPO pending esophagram. ST will continue efforts to completed BSE when appropriate and indicated. Thank you,  Camaron Cammack H. Roddie Mc, CCC-SLP Speech Language Pathologist    Wende Bushy 09/05/2022, 10:59 AM

## 2022-09-05 NOTE — Plan of Care (Signed)

## 2022-09-06 ENCOUNTER — Inpatient Hospital Stay (HOSPITAL_COMMUNITY): Payer: Medicare PPO | Admitting: Certified Registered"

## 2022-09-06 ENCOUNTER — Encounter (HOSPITAL_COMMUNITY): Payer: Self-pay | Admitting: Family Medicine

## 2022-09-06 ENCOUNTER — Encounter (HOSPITAL_COMMUNITY): Payer: Medicare PPO

## 2022-09-06 ENCOUNTER — Encounter (HOSPITAL_COMMUNITY)
Admission: EM | Disposition: A | Discharge status: Skilled Nursing Facility | Payer: Self-pay | Source: Home / Self Care | Attending: Family Medicine

## 2022-09-06 DIAGNOSIS — Z87891 Personal history of nicotine dependence: Secondary | ICD-10-CM

## 2022-09-06 DIAGNOSIS — Z9884 Bariatric surgery status: Secondary | ICD-10-CM

## 2022-09-06 DIAGNOSIS — I110 Hypertensive heart disease with heart failure: Secondary | ICD-10-CM

## 2022-09-06 DIAGNOSIS — K222 Esophageal obstruction: Secondary | ICD-10-CM

## 2022-09-06 DIAGNOSIS — R933 Abnormal findings on diagnostic imaging of other parts of digestive tract: Secondary | ICD-10-CM

## 2022-09-06 DIAGNOSIS — A419 Sepsis, unspecified organism: Secondary | ICD-10-CM | POA: Diagnosis not present

## 2022-09-06 DIAGNOSIS — R1319 Other dysphagia: Secondary | ICD-10-CM | POA: Diagnosis not present

## 2022-09-06 DIAGNOSIS — I5032 Chronic diastolic (congestive) heart failure: Secondary | ICD-10-CM

## 2022-09-06 DIAGNOSIS — R6520 Severe sepsis without septic shock: Secondary | ICD-10-CM | POA: Diagnosis not present

## 2022-09-06 HISTORY — PX: ESOPHAGOGASTRODUODENOSCOPY (EGD) WITH PROPOFOL: SHX5813

## 2022-09-06 SURGERY — ESOPHAGOGASTRODUODENOSCOPY (EGD) WITH PROPOFOL
Anesthesia: General

## 2022-09-06 MED ORDER — HEPARIN SODIUM (PORCINE) 5000 UNIT/ML IJ SOLN
5000.0000 [IU] | Freq: Three times a day (TID) | INTRAMUSCULAR | Status: DC
  Administered 2022-09-07: 5000 [IU] via SUBCUTANEOUS
  Filled 2022-09-06 (×2): qty 1

## 2022-09-06 MED ORDER — LIDOCAINE HCL (CARDIAC) PF 100 MG/5ML IV SOSY
PREFILLED_SYRINGE | INTRAVENOUS | Status: DC | PRN
  Administered 2022-09-06: 50 mg via INTRAVENOUS

## 2022-09-06 MED ORDER — EPHEDRINE SULFATE-NACL 50-0.9 MG/10ML-% IV SOSY
PREFILLED_SYRINGE | INTRAVENOUS | Status: DC | PRN
  Administered 2022-09-06: 5 mg via INTRAVENOUS

## 2022-09-06 MED ORDER — LACTATED RINGERS IV SOLN: INTRAVENOUS | Status: DC

## 2022-09-06 MED ORDER — SODIUM CHLORIDE 0.9 % IV SOLN: INTRAVENOUS | Status: DC

## 2022-09-06 MED ORDER — PANTOPRAZOLE SODIUM 40 MG PO TBEC
40.0000 mg | DELAYED_RELEASE_TABLET | Freq: Two times a day (BID) | ORAL | Status: DC
  Administered 2022-09-06: 40 mg via ORAL
  Filled 2022-09-06 (×3): qty 1

## 2022-09-06 MED ORDER — PROPOFOL 10 MG/ML IV BOLUS
INTRAVENOUS | Status: DC | PRN
  Administered 2022-09-06: 70 mg via INTRAVENOUS

## 2022-09-06 MED ORDER — PHENYLEPHRINE 80 MCG/ML (10ML) SYRINGE FOR IV PUSH (FOR BLOOD PRESSURE SUPPORT)
PREFILLED_SYRINGE | INTRAVENOUS | Status: DC | PRN
  Administered 2022-09-06: 160 ug via INTRAVENOUS
  Administered 2022-09-06 (×3): 80 ug via INTRAVENOUS
  Administered 2022-09-06: 160 ug via INTRAVENOUS

## 2022-09-06 NOTE — Brief Op Note (Signed)
09/01/2022 - 09/06/2022  3:11 PM  PATIENT:  Heather Garrett  72 y.o. female  PRE-OPERATIVE DIAGNOSIS:  esophageal stricture, dysphagia  POST-OPERATIVE DIAGNOSIS:  gastric bypass, stricture upper esophagus @ 15cm dilated w/ scope  PROCEDURE:  Procedure(s) with comments: ESOPHAGOGASTRODUODENOSCOPY (EGD) WITH PROPOFOL (N/A) - with dilation  SURGEON:  Surgeon(s) and Role:    * Harvel Quale, MD - Primary  Patient underwent EGD under propofol sedation.  Tolerated the procedure adequately.    FINDINGS: - One benign-appearing, intrinsic moderate (circumferential scarring or stenosis; an endoscope may pass) stenosis was found 15 cm from the incisors.  This stenosis measured 1 cm (inner diameter) x 1 cm (in length).  The stenosis was traversed after gentle manipulation and pushing the adult 9.2 mm upper scope. There was mucosal disruption at the stricture site upon reinspection. -Evidence of a gastric bypass was found.  A gastric pouch with a normal size was found containing food debris.  The staple line appeared intact.  The gastrojejunal anastomosis was characterized by healthy appearing mucosa.  This was traversed.  The pouch-to-jejunum limb was characterized by healthy appearing mucosa.  -The examined jejunum was normal.   RECOMMENDATIONS - Return patient to hospital ward for ongoing care.  - Full liquid diet, advance as tolerated to soft/puree diet. - Pantoprazole 40 mg BID. - Repeat EGD in 2-3 weeks for repeat dilation.  Maylon Peppers, MD Gastroenterology and Hepatology [facility]

## 2022-09-06 NOTE — Progress Notes (Signed)
SLP Cancellation Note  Patient Details Name: Heather Garrett MRN: [id number] DOB: 01-23-1951   Cancelled treatment:       Reason Eval/Treat Not Completed: Other (comment);Patient at procedure or test/unavailable (Pt off floor for EGD. SLP will follow tomorrow.)  Thank you,  Genene Churn, Dewey  Overton 09/06/2022, 1:47 PM

## 2022-09-06 NOTE — Progress Notes (Signed)
Pt returned to room 339 s/p endo procedure. Pt alert and oriented, denies c/o pain. VSS. IVF infusing without s/s infiltration. Call bell within reach, bed alarm on for safety. Advised to call for needs.

## 2022-09-06 NOTE — Progress Notes (Signed)
We will proceed with EGD as scheduled.  I thoroughly discussed with the patient the procedure, including the risks involved. Patient understands what the procedure involves including the benefits and any risks. Patient understands alternatives to the proposed procedure. Risks including (but not limited to) bleeding, tearing of the lining (perforation), rupture of adjacent organs, problems with heart and lung function, infection, and medication reactions. A small percentage of complications may require surgery, hospitalization, repeat endoscopic procedure, and/or transfusion.  Patient understood and agreed.  Heather Peppers, MD Gastroenterology and Hepatology [facility]

## 2022-09-06 NOTE — Op Note (Signed)
[facility] Patient Name: Heather Garrett Procedure Date: 09/06/2022 2:45 PM MRN: [id number] Date of Birth: [birth year] Attending MD: Maylon Peppers , , [id number] CSN: [id number] Age: 72 Admit Type: Inpatient Procedure:                Upper GI endoscopy Indications:              Dysphagia, Abnormal cine-esophagram Providers:                Maylon Peppers, Caprice Kluver, Randa Spike,                            Technician Referring MD:              Medicines:                Monitored Anesthesia Care Complications:            No immediate complications. Estimated Blood Loss:     Estimated blood loss: none. Procedure:                Pre-Anesthesia Assessment:                           - Prior to the procedure, a History and Physical                            was performed, and patient medications, allergies                            and sensitivities were reviewed. The patient's                            tolerance of previous anesthesia was reviewed.                           - The risks and benefits of the procedure and the                            sedation options and risks were discussed with the                            patient. All questions were answered and informed                            consent was obtained.                           - ASA Grade Assessment: III - A patient with severe                            systemic disease.                           After obtaining informed consent, the endoscope was                            passed under direct vision. Throughout the  procedure, the patient's blood pressure, pulse, and                            oxygen saturations were monitored continuously. The                            [id number] scope was introduced through the                            mouth, and advanced to the second part of duodenum.                            The upper GI endoscopy was accomplished without                             difficulty. The patient tolerated the procedure                            well. Scope In: 2:58:41 PM Scope Out: 3:02:07 PM Total Procedure Duration: 0 hours 3 minutes 26 seconds  Findings:      One benign-appearing, intrinsic moderate (circumferential scarring or       stenosis; an endoscope may pass) stenosis was found 15 cm from the       incisors. This stenosis measured 1 cm (inner diameter) x 1 cm (in       length). The stenosis was traversed after gentle manipulation and       pushing the adult 9.2 mm upper scope. There was mucosal disruption at       the stricture site upon reinspection.      Evidence of a gastric bypass was found. A gastric pouch with a normal       size was found containing food debris. The staple line appeared intact.       The gastrojejunal anastomosis was characterized by healthy appearing       mucosa. This was traversed. The pouch-to-jejunum limb was characterized       by healthy appearing mucosa.      The examined jejunum was normal. Impression:               - Benign-appearing esophageal stenosis, dilated                            with scope.                           - Gastric bypass with a normal-sized pouch and                            intact staple line. Gastrojejunal anastomosis                            characterized by healthy appearing mucosa.                           - Normal examined jejunum.                           -  No specimens collected. Moderate Sedation:      Per Anesthesia Care Recommendation:           - Return patient to hospital ward for ongoing care.                           - Full liquid diet, advance as tolerated to                            soft/puree diet.                           - Pantoprazole 40 mg BID.                           - Repeat EGD in 2-3 weeks for repeat dilation. Procedure Code(s):        --- Professional ---                           [phone], Esophagogastroduodenoscopy,  flexible,                            transoral; diagnostic, including collection of                            specimen(s) by brushing or washing, when performed                            (separate procedure) Diagnosis Code(s):        --- Professional ---                           K22.2, Esophageal obstruction                           Z98.84, Bariatric surgery status                           R13.10, Dysphagia, unspecified                           R93.3, Abnormal findings on diagnostic imaging of                            other parts of digestive tract CPT copyright 2022 [employer]. All rights reserved. The codes documented in this report are preliminary and upon coder review may  be revised to meet current compliance requirements. Maylon Peppers, MD Maylon Peppers,  09/06/2022 3:15:15 PM This report has been signed electronically. Number of Addenda: 0

## 2022-09-06 NOTE — Transfer of Care (Addendum)
Immediate Anesthesia Transfer of Care Note  Patient: Heather Garrett  Procedure(s) Performed: ESOPHAGOGASTRODUODENOSCOPY (EGD) WITH PROPOFOL  Patient Location: PACU  Anesthesia Type:General  Level of Consciousness: drowsy  Airway & Oxygen Therapy: Patient Spontanous Breathing  Post-op Assessment: Report given to RN and Post -op Vital signs reviewed and stable  Post vital signs: Reviewed and stable  Last Vitals:  Vitals Value Taken Time  BP 77/54 09/06/22 1507  Temp 97.6   Pulse 64 09/06/22 1508  Resp 22 09/06/22 1508  SpO2 96 % 09/06/22 1508  Vitals shown include unvalidated device data.  Last Pain:  Vitals:   09/06/22 1451  TempSrc:   PainSc: 8       Patients Stated Pain Goal: 6 ([id number])  Complications: No notable events documented.

## 2022-09-06 NOTE — Anesthesia Preprocedure Evaluation (Signed)
Anesthesia Evaluation  Patient identified by MRN, date of birth, ID band Patient awake    Reviewed: Allergy & Precautions, NPO status , Patient's Chart, lab work & pertinent test results, reviewed documented beta blocker date and time   Airway Mallampati: III  TM Distance: >3 FB Neck ROM: Full  Mouth opening: Limited Mouth Opening  Dental  (+) Dental Advisory Given, Teeth Intact   Pulmonary sleep apnea (snoring) , pneumonia, former smoker   Pulmonary exam normal breath sounds clear to auscultation       Cardiovascular Exercise Tolerance: Poor hypertension, Pt. on medications and Pt. on home beta blockers + CAD, + Past MI and +CHF  Normal cardiovascular exam+ Valvular Problems/Murmurs  Rhythm:Regular Rate:Normal  1. Left ventricular ejection fraction, by estimation, is 55 to 60%. The  left ventricle has normal function. The left ventricle has no regional  wall motion abnormalities. Left ventricular diastolic parameters are  consistent with Grade I diastolic  dysfunction (impaired relaxation).   2. Right ventricular systolic function is normal. The right ventricular  size is normal.   3. The mitral valve is normal in structure. No evidence of mitral valve  regurgitation. No evidence of mitral stenosis.   4. Tricuspid valve regurgitation is moderate.   5. The aortic valve was not well visualized. Aortic valve regurgitation  is not visualized. No aortic stenosis is present.   6. Aortic dilatation noted. There is mild dilatation of the aortic root,  measuring 38 mm.   7. The inferior vena cava is normal in size with greater than 50%  respiratory variability, suggesting right atrial pressure of 3 mmHg.     Neuro/Psych  Headaches PSYCHIATRIC DISORDERS Anxiety Depression     Neuromuscular disease    GI/Hepatic hiatal hernia,GERD  Medicated and Controlled,,  Endo/Other  diabetes, Well Controlled, Type 2, Oral Hypoglycemic Agents     Renal/GU Renal disease Bladder dysfunction (bladder cancer)      Musculoskeletal  (+) Arthritis , Osteoarthritis,    Abdominal   Peds  Hematology  (+) Blood dyscrasia, anemia   Anesthesia Other Findings IMPRESSION: Severe diffuse fatty infiltration of the liver.   Diffuse aortic atherosclerosis.   Trace free fluid in the cul-de-sac.   Small hiatal hernia.     Electronically Signed   By: Rolm Baptise M.D.   On: 09/01/2022 18:06     Reproductive/Obstetrics                              Anesthesia Physical Anesthesia Plan  ASA: 3  Anesthesia Plan: General   Post-op Pain Management: Minimal or no pain anticipated   Induction: Intravenous  PONV Risk Score and Plan: Treatment may vary due to age or medical condition and Propofol infusion  Airway Management Planned: Nasal Cannula and Natural Airway  Additional Equipment:   Intra-op Plan:   Post-operative Plan: Possible Post-op intubation/ventilation  Informed Consent: I have reviewed the patients History and Physical, chart, labs and discussed the procedure including the risks, benefits and alternatives for the proposed anesthesia with the patient or authorized representative who has indicated his/her understanding and acceptance.     Dental advisory given  Plan Discussed with: CRNA and Surgeon  Anesthesia Plan Comments:          Anesthesia Quick Evaluation

## 2022-09-06 NOTE — Progress Notes (Signed)
PROGRESS NOTE  Heather Garrett, is a 72 y.o. female, DOB - 07-Jan-1951, [id number]  Admit date - 09/01/2022   Admitting Physician [name], MD  Outpatient Primary MD for the patient is Heather Garrett, [place]  LOS - 5  Chief Complaint  Patient presents with   Extremity Weakness      Brief Narrative:  72 y.o. female with medical history significant for bladder cancer, diastolic CHF, diabetes mellitus, hypertension, OSA.  Admitted with severe sepsis on 09/01/2022 presumably from urinary source -Patient prior urine culture from 08/2022 grew pansensitive E. coli and Proteus mirabilis    -Assessment and Plan: 1)Severe E. coli and Proteus Sepsis from urinary source--POA -Urine culture from 09/01/2020 with E. coli and Proteus Simas urine culture from 08/16/2022 -Continue IV Rocephin -Anticipate discharge on cefadroxil 500 bid to complete at least 7 days of therapy  2)AKI suspect due to dehydration/sepsis/UTI - renally adjust medications, avoid nephrotoxic agents / dehydration  / hypotension Admission creatinine 1.6, recent baseline between 0.8 and 1 -continue trending down with hydration  3)Hypotension -BP Improved with transfusion of PRBC and IV fluids- -continue IV antibiotics - a.m. cortisol level is Not low -Okay to restart Coreg  4)Chronic diastolic CHF (congestive heart failure) (HCC) Acute on chronic bilateral lower extremity edema, which could be from poor ambulation, and venous stasis, otherwise with a stable white opacity, and chest x-ray is clear.   Last echo 05/2022 EF of 55 to 60% with grade 1 DD.   -Gentle judicious hydration at this time due to #1 and #2 above -Coreg as ordered  5) acute on chronic symptomatic anemia-- Hgb is back to 10 from 7.1 after transfusion of 1 unit of PRBC on 09/04/2022 -No bleeding concerns noted -Suspect some hemodilution effect from IV fluids in setting of hypotension and AKI on admission - 6)Dysphagia-----Pt has solid dysphagia.   -Patient had EGD in Nov 2022 without any luminal narrowing to explain symptoms. Seems unusual that she would develop a stricture/stenosis so quickly, especially in the absence of GERD symptoms or esophagitis.  09/06/22 -Barium swallow/esophagram on 09/05/2022 shows smooth tapered narrowing of the lower cervical/upper thoracic esophagus, obstructing a 12.5 mm tablet. Additional significant narrowing was seen at the GE junction but unable to assess patency as the tablet did not reach distal esophagus.  Prior EGD 05/27/2021  showed enlarged gastric pouch with a hiatal hernia, but was otherwise unremarkable.  No marginal ulcer present.   -Patient had EGD on 09/06/2022 as inpatient given extensive comorbidities that would make it difficult to do outpatient EGD also patient having significant difficulty with oral intake unable to discharge home without trying to address her significant dysphagia - the patient had 10 adenomas in Nov 2022, including 3 polyps > 1cm. She is fairly high risk for colon cancer, but her functional status has declined recently; the benefits of further colon cancer screening may be outweighed by the risks. The patient would tentatively like to proceed with completing the colonoscopy.  -Given significant comorbidities GI team recommended that if EGD and colonoscopy was needed to be done in the inpatient setting -Colonoscopy deferred at this time  09/06/22- -EGD on 09/06/2022 shows Benign-appearing esophageal stenosis, dilated with scope.                           - Gastric bypass with a normal-sized pouch and intact staple line gastrojejunal anastomosis characterized by healthy appearing mucosa. Normal examined jejunum. 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  She follows with urologist here in [place].  CT Abd and pelvis today  unremarkable. -Patient with recurrent  gram-negative rod UTI as above #1  Migraine without aura and without status migrainosus, not intractable Home atogepant for migraine prophylaxis held for now, with renal insufficiency.  Generalized weakness Generalized weakness progressive over the past 6 months.  Now with severe sepsis from UTI.  -PT eval appreciated recommends SNF rehab  Status is: Inpatient   Disposition: The patient is from: Home              Anticipated d/c is to: SNF              Anticipated d/c date is: 2 days              Patient currently is not medically stable to d/c. Barriers: Not Clinically Stable-   Code Status :  -  Code Status: Full Code   Family Communication:    Discussed with daughter and husband previously  DVT Prophylaxis  :   - SCDs   heparin injection 5,000 Units Start: 09/07/22 0600 Place and maintain sequential compression device Start: 09/06/22 1258  Lab Results  Component Value Date   PLT 174 09/05/2022   Inpatient Medications  Scheduled Meds:  aspirin EC  81 mg Oral Daily   carvedilol  6.25 mg Oral BID WC   gabapentin  100 mg Oral TID   [START ON 09/07/2022] heparin injection (subcutaneous)  5,000 Units Subcutaneous Q8H   mupirocin ointment  1 Application Nasal BID   pantoprazole  40 mg Oral BID   Continuous Infusions:  sodium chloride Stopped (09/05/22 1144)   cefTRIAXone (ROCEPHIN)  IV Stopped (09/05/22 2134)   PRN Meds:.acetaminophen **OR** acetaminophen, naphazoline-pheniramine, ondansetron **OR** ondansetron (ZOFRAN) IV, mouth rinse, polyethylene glycol, traMADol   Anti-infectives (From admission, onward)    Start     Dose/Rate Route Frequency Ordered Stop   09/01/22 1945  cefTRIAXone (ROCEPHIN) 2 g in sodium chloride 0.9 % 100 mL IVPB        2 g 200 mL/hr over 30 Minutes Intravenous Every 24 hours 09/01/22 1931 2022/09/17 2059      Subjective: [name] today has no fevers, no emesis,  No chest pain,    -ReCovering  well after EGD with dilatation -Willing to try full liquid diet  Objective: Vitals:   09/06/22 1530 09/06/22 1534 09/06/22 1540 09/06/22 1556  BP: (!) 89/52 97/60 102/63 (!) 115/97  Pulse: 74 71 72 68  Resp: '14 13 14 12  '$ Temp:    (!) 97.5 F (36.4 C)  TempSrc:    Oral  SpO2: 95% 92% 97% 96%  Weight:      Height:        Intake/Output Summary (Last 24 hours) at 09/06/2022 1919 Last data filed at 09/06/2022 1504 Gross per 24 hour  Intake 1000.08 ml  Output 500 ml  Net 500.08 ml   Filed Weights   09/04/22 0648 09/05/22 0400 09/06/22 1321  Weight: 60.7 kg 60.9 kg 60.9 kg   Physical Exam  Gen:- Awake Alert,  in no apparent distress  HEENT:- [place].AT, No sclera icterus Neck-Supple Neck,No JVD,.  Lungs-  CTAB , fair symmetrical air movement CV- S1, S2 normal, regular  Abd-  +ve B.Sounds, Abd Soft, No tenderness, no CVA area tenderness Extremity/Skin:- No  edema, pedal pulses present  Psych-affect is appropriate, oriented x3 Neuro-generalized weakness, no new focal deficits, no tremors  Data Reviewed: I have personally reviewed following labs and imaging studies  CBC: Recent Labs  Lab 09/01/22 1520 09/02/22 0750 09/04/22 0328 09/05/22 0411  WBC 18.7* 11.2* 6.1 10.1  HGB 10.1* 7.4* 7.1* 10.0*  HCT 31.4* 23.2* 22.9* 32.3*  MCV 109.8* 110.5* 115.1* 99.7  PLT 223 189 137* [id number]   Basic Metabolic Panel: Recent Labs  Lab 09/01/22 1520 09/02/22 0605 09/05/22 0411  NA 131* 133* 136  K 4.7 4.2 3.9  CL 97* 103 109  CO2 21* 21* 16*  GLUCOSE 101* 72 77  BUN '20 19 17  '$ CREATININE 1.67* 1.51* 1.44*  CALCIUM 7.5* 7.3* 7.7*   GFR: Estimated Creatinine Clearance: 25.7 mL/min (A) (by C-G formula based on SCr of 1.44 mg/dL (H)).  Recent Results (from the past 240 hour(s))  Resp panel by RT-PCR (RSV, Flu A&B, Covid) Anterior Nasal Swab     Status: None   Collection Time: 09/01/22  4:48 PM   Specimen: Anterior Nasal Swab  Result Value Ref Range Status   SARS Coronavirus 2 by  RT PCR NEGATIVE NEGATIVE Final    Comment: (NOTE) SARS-CoV-2 target nucleic acids are NOT DETECTED.  The SARS-CoV-2 RNA is generally detectable in upper respiratory specimens during the acute phase of infection. The lowest concentration of SARS-CoV-2 viral copies this assay can detect is 138 copies/mL. A negative result does not preclude SARS-Cov-2 infection and should not be used as the sole basis for treatment or other patient management decisions. A negative result may occur with  improper specimen collection/handling, submission of specimen other than nasopharyngeal swab, presence of viral mutation(s) within the areas targeted by this assay, and inadequate number of viral copies(<138 copies/mL). A negative result must be combined with clinical observations, patient history, and epidemiological information. The expected result is Negative.  Fact Sheet for Patients:  EntrepreneurPulse.com.au  Fact Sheet for Healthcare Providers:  IncredibleEmployment.be  This test is no t yet approved or cleared by the [place] FDA and  has been authorized for detection and/or diagnosis of SARS-CoV-2 by FDA under an Emergency Use Authorization (EUA). This EUA will remain  in effect (meaning this test can be used) for the duration of the COVID-19 declaration under Section 564(b)(1) of the Act, 21 U.S.C.section 360bbb-3(b)(1), unless the authorization is terminated  or revoked sooner.       Influenza A by PCR NEGATIVE NEGATIVE Final   Influenza B by PCR NEGATIVE NEGATIVE Final    Comment: (NOTE) The Xpert Xpress SARS-CoV-2/FLU/RSV plus assay is intended as an aid in the diagnosis of influenza from Nasopharyngeal swab specimens and should not be used as a sole basis for treatment. Nasal washings and aspirates are unacceptable for Xpert Xpress SARS-CoV-2/FLU/RSV testing.  Fact Sheet for Patients: EntrepreneurPulse.com.au  Fact Sheet  for Healthcare Providers: IncredibleEmployment.be  This test is not yet approved or cleared by the [place] FDA and has been authorized for detection and/or diagnosis of SARS-CoV-2 by FDA under an Emergency Use Authorization (EUA). This EUA will remain in effect (meaning this test can be used) for the duration of the COVID-19 declaration under Section 564(b)(1) of the Act, 21 U.S.C. section 360bbb-3(b)(1), unless the authorization is terminated or revoked.     Resp Syncytial Virus by PCR NEGATIVE NEGATIVE Final    Comment: (NOTE) Fact Sheet for Patients: EntrepreneurPulse.com.au  Fact Sheet for Healthcare Providers: IncredibleEmployment.be  This test is not yet approved or cleared by the  [place] Architectural technologist and has been authorized for detection and/or diagnosis of SARS-CoV-2 by FDA under an Print production planner (EUA). This EUA will remain in effect (meaning this test can be used) for the duration of the COVID-19 declaration under Section 564(b)(1) of the Act, 21 U.S.C. section 360bbb-3(b)(1), unless the authorization is terminated or revoked.  Performed at [facility], [address], [place], [place] [address]   Urine Culture     Status: Abnormal   Collection Time: 09/01/22  6:50 PM   Specimen: Urine, Clean Catch  Result Value Ref Range Status   Specimen Description   Final    URINE, CLEAN CATCH Performed at [facility], [address] [place], [place] [address]    Special Requests   Final    NONE Performed at [facility], [address] [place], [place] [address]    Culture (A)  Final    >=100,000 COLONIES/mL ESCHERICHIA COLI >=100,000 COLONIES/mL PROTEUS MIRABILIS    Report Status 09/05/2022 FINAL  Final   Organism ID, Bacteria ESCHERICHIA COLI (A)  Final   Organism ID, Bacteria PROTEUS MIRABILIS (A)  Final      Susceptibility   Escherichia coli - MIC*    AMPICILLIN <=2 SENSITIVE Sensitive      CEFAZOLIN <=4 SENSITIVE Sensitive     CEFEPIME <=0.12 SENSITIVE Sensitive     CEFTRIAXONE <=0.25 SENSITIVE Sensitive     CIPROFLOXACIN <=0.25 SENSITIVE Sensitive     GENTAMICIN <=1 SENSITIVE Sensitive     IMIPENEM <=0.25 SENSITIVE Sensitive     NITROFURANTOIN <=16 SENSITIVE Sensitive     TRIMETH/SULFA <=20 SENSITIVE Sensitive     AMPICILLIN/SULBACTAM <=2 SENSITIVE Sensitive     PIP/TAZO <=4 SENSITIVE Sensitive     * >=100,000 COLONIES/mL ESCHERICHIA COLI   Proteus mirabilis - MIC*    AMPICILLIN <=2 SENSITIVE Sensitive     CEFAZOLIN 8 SENSITIVE Sensitive     CEFEPIME <=0.12 SENSITIVE Sensitive     CEFTRIAXONE <=0.25 SENSITIVE Sensitive     CIPROFLOXACIN <=0.25 SENSITIVE Sensitive     GENTAMICIN <=1 SENSITIVE Sensitive     IMIPENEM 2 SENSITIVE Sensitive     NITROFURANTOIN 128 RESISTANT Resistant     TRIMETH/SULFA <=20 SENSITIVE Sensitive     AMPICILLIN/SULBACTAM <=2 SENSITIVE Sensitive     PIP/TAZO <=4 SENSITIVE Sensitive     * >=100,000 COLONIES/mL PROTEUS MIRABILIS  Culture, blood (Routine X 2) w Reflex to ID Panel     Status: None (Preliminary result)   Collection Time: 09/02/22  6:05 AM   Specimen: BLOOD RIGHT HAND  Result Value Ref Range Status   Specimen Description   Final    BLOOD RIGHT HAND BOTTLES DRAWN AEROBIC AND ANAEROBIC   Special Requests Blood Culture adequate volume  Final   Culture   Final    NO GROWTH 4 DAYS Performed at [facility], [address], [place], [place] [address]    Report Status PENDING  Incomplete  Culture, blood (Routine X 2) w Reflex to ID Panel     Status: None (Preliminary result)   Collection Time: 09/02/22  6:05 AM   Specimen: BLOOD LEFT HAND  Result Value Ref Range Status   Specimen Description   Final    BLOOD LEFT HAND BOTTLES DRAWN AEROBIC AND ANAEROBIC   Special Requests Blood Culture adequate volume  Final   Culture   Final    NO GROWTH 4 DAYS Performed at [facility], [address], [place],  [address]    Report  Status  PENDING  Incomplete  MRSA Next Gen by PCR, Nasal     Status: Abnormal   Collection Time: 09/04/22  7:24 PM   Specimen: Nasal Mucosa; Nasal Swab  Result Value Ref Range Status   MRSA by PCR Next Gen DETECTED (A) NOT DETECTED Final    Comment: RESULT CALLED TO, READ BACK BY AND VERIFIED WITH: BSEPTARAI,S ON 09/04/22 @ 2055 BY ACOSTA,A Performed at [facility], [address], [place], [place] [address]     Radiology Studies: DG ESOPHAGUS W SINGLE CM (SOL OR THIN BA)  Result Date: 09/05/2022 CLINICAL DATA:  Lower cervical/upper thoracic dysphagia for solids and pills EXAM: ESOPHOGRAM/BARIUM SWALLOW TECHNIQUE: Single contrast examination was performed using thin barium. A swallow of thick barium was also utilized. Patient swallowed a 12.5 mm diameter barium tablet as well. FLUOROSCOPY: Radiation Exposure Index (as provided by the fluoroscopic device): 50.3 mGy Kerma COMPARISON:  None Available. FINDINGS: Esophageal distention: Tapered narrowing at the lower cervical/upper thoracic esophagus. An additional area of narrowing is noted at the GE junction. Filling defects:  None 12.5 mm barium tablet: Transiently delayed at the vallecula. Tablet obstructed at the tapered area of narrowing at the lower cervical/upper thoracic esophagus. Findings are consistent with a stricture. 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 Positive spontaneous cough reflex. Hiatal hernia: Prior gastric bypass surgery. Small gastric remnant, partially above the diaphragm GE reflux:  Not witnessed during exam Other:  N/A IMPRESSION: Esophageal dysmotility. Single episode of laryngeal penetration and aspiration with positive cough reflex. Smooth tapered narrowing of the lower cervical/upper thoracic  esophagus, obstructing a 12.5 mm diameter barium tablet, consistent with stricture, favor benign stricture. Additional significant narrowing is seen at the GE junction though due to proximal stricture, unable to assess patency of the GE junction due to 12.5 mm diameter barium tablet being unable to reach the distal esophagus. Electronically Signed   By: Lavonia Dana M.D.   On: 09/05/2022 12:53    Scheduled Meds:  aspirin EC  81 mg Oral Daily   carvedilol  6.25 mg Oral BID WC   gabapentin  100 mg Oral TID   [START ON 09/07/2022] heparin injection (subcutaneous)  5,000 Units Subcutaneous Q8H   mupirocin ointment  1 Application Nasal BID   pantoprazole  40 mg Oral BID   Continuous Infusions:  sodium chloride Stopped (09/05/22 1144)   cefTRIAXone (ROCEPHIN)  IV Stopped (09/05/22 2134)    LOS: 5 days   Roxan Hockey M.D on 09/06/2022 at 7:19 PM  Go to [email or web address] - for contact info  [facility] - Office  [phone]  If 7PM-7AM, please contact night-coverage [email or web address] 09/06/2022, 7:19 PM

## 2022-09-06 NOTE — Anesthesia Postprocedure Evaluation (Signed)
Anesthesia Post Note  Patient: Heather Garrett  Procedure(s) Performed: ESOPHAGOGASTRODUODENOSCOPY (EGD) WITH PROPOFOL  Patient location during evaluation: Phase II Anesthesia Type: General Level of consciousness: awake and alert and oriented Pain management: pain level controlled Vital Signs Assessment: post-procedure vital signs reviewed and stable Respiratory status: spontaneous breathing, nonlabored ventilation and respiratory function stable Cardiovascular status: blood pressure returned to baseline and stable Postop Assessment: no apparent nausea or vomiting Anesthetic complications: no  No notable events documented.   Last Vitals:  Vitals:   09/06/22 1556 09/06/22 2121  BP: (!) 115/97 90/66  Pulse: 68 70  Resp: 12 14  Temp: (!) 36.4 C 36.4 C  SpO2: 96% 94%    Last Pain:  Vitals:   09/06/22 2145  TempSrc:   PainSc: Asleep                 Severina Sykora C Charolett Yarrow

## 2022-09-07 ENCOUNTER — Telehealth (INDEPENDENT_AMBULATORY_CARE_PROVIDER_SITE_OTHER): Payer: Self-pay | Admitting: Gastroenterology

## 2022-09-07 DIAGNOSIS — A419 Sepsis, unspecified organism: Secondary | ICD-10-CM | POA: Diagnosis not present

## 2022-09-07 DIAGNOSIS — R6520 Severe sepsis without septic shock: Secondary | ICD-10-CM | POA: Diagnosis not present

## 2022-09-07 LAB — CULTURE, BLOOD (ROUTINE X 2)
Culture: NO GROWTH
Culture: NO GROWTH
Special Requests: ADEQUATE
Special Requests: ADEQUATE

## 2022-09-07 LAB — CBC
HCT: 31.5 % — ABNORMAL LOW (ref 36.0–46.0)
Hemoglobin: 9.6 g/dL — ABNORMAL LOW (ref 12.0–15.0)
MCH: 30.1 pg (ref 26.0–34.0)
MCHC: 30.5 g/dL (ref 30.0–36.0)
MCV: 98.7 fL (ref 80.0–100.0)
Platelets: 151 10*3/uL (ref 150–400)
RBC: 3.19 MIL/uL — ABNORMAL LOW (ref 3.87–5.11)
RDW: 28.1 % — ABNORMAL HIGH (ref 11.5–15.5)
WBC: 11.7 10*3/uL — ABNORMAL HIGH (ref 4.0–10.5)
nRBC: 0 % (ref 0.0–0.2)

## 2022-09-07 LAB — BASIC METABOLIC PANEL
Anion gap: 6 (ref 5–15)
BUN: 17 mg/dL (ref 8–23)
CO2: 17 mmol/L — ABNORMAL LOW (ref 22–32)
Calcium: 7.5 mg/dL — ABNORMAL LOW (ref 8.9–10.3)
Chloride: 111 mmol/L (ref 98–111)
Creatinine, Ser: 1.39 mg/dL — ABNORMAL HIGH (ref 0.44–1.00)
GFR, Estimated: 40 mL/min — ABNORMAL LOW (ref >60)
Glucose, Bld: 71 mg/dL (ref 70–99)
Potassium: 3.6 mmol/L (ref 3.5–5.1)
Sodium: 134 mmol/L — ABNORMAL LOW (ref 135–145)

## 2022-09-07 MED ORDER — SODIUM CHLORIDE 0.9 % IV SOLN: INTRAVENOUS | Status: AC

## 2022-09-07 MED ORDER — RISAQUAD PO CAPS
2.0000 | ORAL_CAPSULE | Freq: Three times a day (TID) | ORAL | Status: DC
  Administered 2022-09-07: 2 via ORAL
  Filled 2022-09-07: qty 2

## 2022-09-07 MED ORDER — PANTOPRAZOLE SODIUM 40 MG PO TBEC
40.0000 mg | DELAYED_RELEASE_TABLET | Freq: Every day | ORAL | Status: DC
  Administered 2022-09-07: 40 mg via ORAL

## 2022-09-07 MED ORDER — PANTOPRAZOLE SODIUM 40 MG PO TBEC: 40.0000 mg | DELAYED_RELEASE_TABLET | Freq: Every day | ORAL | 0 refills | Status: DC

## 2022-09-07 MED ORDER — FAMOTIDINE 40 MG PO TABS: 40.0000 mg | ORAL_TABLET | ORAL | 2 refills | Status: DC | PRN

## 2022-09-07 MED ORDER — ORAL CARE MOUTH RINSE: 15.0000 mL | OROMUCOSAL | 0 refills | Status: DC | PRN

## 2022-09-07 MED ORDER — TRAMADOL HCL 50 MG PO TABS: 50.0000 mg | ORAL_TABLET | Freq: Four times a day (QID) | ORAL | 0 refills | Status: AC | PRN

## 2022-09-07 MED ORDER — ROSUVASTATIN CALCIUM 5 MG PO TABS: 5.0000 mg | ORAL_TABLET | Freq: Every day | ORAL | 0 refills | Status: AC

## 2022-09-07 MED ORDER — SODIUM CHLORIDE 0.9 % IV SOLN: INTRAVENOUS | Status: DC

## 2022-09-07 MED ORDER — CEFADROXIL 500 MG PO CAPS: 500.0000 mg | ORAL_CAPSULE | Freq: Two times a day (BID) | ORAL | 0 refills | Status: AC

## 2022-09-07 MED ORDER — FERROUS SULFATE 325 (65 FE) MG PO TBEC: 325.0000 mg | DELAYED_RELEASE_TABLET | ORAL | 0 refills | Status: DC

## 2022-09-07 MED ORDER — RISAQUAD PO CAPS: 2.0000 | ORAL_CAPSULE | Freq: Three times a day (TID) | ORAL | 0 refills | Status: AC

## 2022-09-07 NOTE — Progress Notes (Signed)
Report called to [facility] at [facility]. Awaiting EMS.

## 2022-09-07 NOTE — TOC Transition Note (Signed)
Transition of Care [facility]) - CM/SW Discharge Note   Patient Details  Name: Heather Garrett MRN: [id number] Date of Birth: 29-Oct-1950  [facility] CM/SW Contact:  Boneta Lucks, RN Phone Number: 09/07/2022, 11:41 AM   Clinical Narrative:   Patient is ready for discharge. [facility] provided room number, RN calling report. CM spoke with her husband to update him with discharge plan. TOC will call EMS with RN is ready. Medical necessity printed.    Final next level of care: Skilled Nursing Facility Barriers to Discharge: Barriers Resolved   Patient Goals and CMS Choice CMS Medicare.gov Compare Post Acute Care list provided to:: Patient Choice offered to / list presented to : Patient  Discharge Placement                 Patient to be transferred to facility by: EMS Name of family member notified: spouse Patient and family notified of of transfer: 09/07/22  Discharge Plan and Services Additional resources added to the After Visit Summary for          Social Determinants of Health (SDOH) Interventions SDOH Screenings   Food Insecurity: No Food Insecurity (09/03/2022)  Housing: Low Risk  (09/03/2022)  Transportation Needs: Unmet Transportation Needs (09/03/2022)  Utilities: At Risk (09/03/2022)  Depression (PHQ2-9): Medium Risk (08/29/2022)  Tobacco Use: Medium Risk (09/06/2022)    Readmission Risk Interventions    12/13/2021    3:40 PM  Readmission Risk Prevention Plan  Transportation Screening Complete  Medication Review ([place]) Complete  PCP or Specialist appointment within 3-5 days of discharge Complete  HRI or [place] Complete  SW Recovery Care/Counseling Consult Complete  Palliative Care Screening Complete  Skilled Nursing Facility Complete

## 2022-09-07 NOTE — Discharge Summary (Signed)
Physician Discharge Summary   Patient: Heather Garrett MRN: [id number] DOB: July 17, 1950  Admit date:     09/01/2022  Discharge date: 09/07/22  Discharge Physician: Deatra James   PCP: [name], FNP   Recommendations at discharge:   1. Outpatient follow-up with PCP in 1 week    Discharge Diagnoses: Principal Problem:   Severe sepsis ([place]) Active Problems:   UTI (urinary tract infection)   AKI (acute kidney injury) ([place])   Generalized weakness   Dysphagia   Migraine without aura and without status migrainosus, not intractable   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HCC)   Hypertension   Chronic diastolic CHF (congestive heart failure) (HCC)   Sepsis (HCC)   Abnormal esophagram   Esophageal stricture  Resolved Problems:   * No resolved hospital problems. *  72 y.o. female with medical history significant for bladder cancer, diastolic CHF, diabetes mellitus, hypertension, OSA.  Admitted with severe sepsis on 09/01/2022 presumably from urinary source -Patient prior urine culture from 08/2022 grew pansensitive E. coli and Proteus mirabilis      1)Severe E. coli and Proteus Sepsis from urinary source--POA -Urine culture from 09/01/2020 with E. coli and Proteus Simas urine culture from 08/16/2022 -Continue IV Rocephin D/Ced on 2/28  -Discharge on cefadroxil 500 bid to complete at least 7 days of therapy   2)AKI suspect due to dehydration/sepsis/UTI - renally adjust medications, avoid nephrotoxic agents / dehydration  / hypotension Admission creatinine 1.6, recent baseline between 0.8 and 1 Lab Results  Component Value Date   CREATININE 1.39 (H) 09/07/2022   CREATININE 1.44 (H) 09/05/2022   CREATININE 1.51 (H) 09/02/2022    -continue trending down with hydration   3)Hypotension -BP Improved with transfusion of PRBC and IV fluids- SP IVF  -Okay to restart Coreg   4)Chronic diastolic CHF (congestive heart failure) (HCC) Acute on chronic bilateral lower  extremity edema, which could be from poor ambulation, and venous stasis, otherwise with a stable white opacity, and chest x-ray is clear.   Last echo 05/2022 EF of 55 to 60% with grade 1 DD.   -Gentle judicious hydration at this time due to #1 and #2 above -Coreg as ordered   5) acute on chronic symptomatic anemia-- Hgb is back to 10 from 7.1 after transfusion of 1 unit of PRBC on 09/04/2022 -No bleeding concerns noted -Suspect some hemodilution effect from IV fluids in setting of hypotension and AKI on admission - 6)Dysphagia-----Pt has solid dysphagia.  -Patient had EGD in Nov 2022 without any luminal narrowing to explain symptoms. Seems unusual that she would develop a stricture/stenosis so quickly, especially in the absence of GERD symptoms or esophagitis.  09/06/22 -Barium swallow/esophagram on 09/05/2022 shows smooth tapered narrowing of the lower cervical/upper thoracic esophagus, obstructing a 12.5 mm tablet. Additional significant narrowing was seen at the GE junction but unable to assess patency as the tablet did not reach distal esophagus.  Prior EGD 05/27/2021  showed enlarged gastric pouch with a hiatal hernia, but was otherwise unremarkable.  No marginal ulcer present.   -Patient had EGD on 09/06/2022 as inpatient given extensive comorbidities that would make it difficult to do outpatient EGD also patient having significant difficulty with oral intake unable to discharge home without trying to address her significant dysphagia - the patient had 10 adenomas in Nov 2022, including 3 polyps > 1cm. She is fairly high risk for colon cancer, but her functional status has declined recently; the benefits of further colon cancer screening may be  outweighed by the risks. The patient would tentatively like to proceed with completing the colonoscopy.  -Given significant comorbidities GI team recommended that if EGD and colonoscopy was needed to be done in the inpatient setting -Colonoscopy deferred  at this time   09/06/22- -EGD on 09/06/2022 shows Benign-appearing esophageal stenosis, dilated with scope.                           - Gastric bypass with a normal-sized pouch and intact staple line gastrojejunal anastomosis characterized by healthy appearing mucosa. Normal examined jejunum. -GI team recommends repeat EGD in about 3 weeks   7)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[place]) -she was treated for this in 2014 with chemotherapy and she is currently in remission.  She follows with urologist here in [place].  CT Abd and pelvis today unremarkable. -Patient with recurrent  gram-negative rod UTI as above #1   Migraine without aura and without status migrainosus, not intractable Home atogepant for migraine prophylaxis held for now, with renal insufficiency.   Generalized weakness Generalized weakness progressive over the past 6 months.  Now with severe sepsis from UTI.  -PT eval appreciated recommends SNF rehab   Status is: Inpatient    Disposition: The patient is from: Home              Anticipated d/c is to: SNF   Consultants: GI  Disposition: Skilled nursing facility Diet recommendation:  Discharge Diet Orders (From admission, onward)     Start     Ordered   09/07/22 0000  Diet - low sodium heart healthy        09/07/22 0804           Clear liquid diet, advance as tolerated DISCHARGE MEDICATION: Allergies as of 09/07/2022       Reactions   Contrast Media [iodinated Contrast Media] Anaphylaxis, Hives   Ciprofloxacin Other (See Comments)   Interaction w other medicine   Erythromycin Base Nausea And Vomiting   Sulfabenzamide Other (See Comments)   UNK reaction        Medication List     STOP taking these medications    atorvastatin 40 MG tablet Commonly known as: LIPITOR   cephALEXin 500 MG capsule Commonly known as: KEFLEX   ezetimibe 10 MG tablet Commonly known as: Zetia   folic acid 1 MG tablet Commonly known as: FOLVITE   meclizine 12.5 MG  tablet Commonly known as: ANTIVERT   meclizine 25 MG tablet Commonly known as: ANTIVERT   simvastatin 20 MG tablet Commonly known as: ZOCOR   tiZANidine 4 MG tablet Commonly known as: ZANAFLEX   trimethobenzamide 300 MG capsule Commonly known as: TIGAN   Ubrelvy 100 MG Tabs Generic drug: Ubrogepant       TAKE these medications    acidophilus Caps capsule Take 2 capsules by mouth 3 (three) times daily for 5 days.   alendronate 70 MG tablet Commonly known as: FOSAMAX 70 mg once a week.   aspirin EC 81 MG tablet Take 1 tablet (81 mg total) by mouth every other day. Swallow whole.  Hold aspirin while taking eliquis, What changed: when to take this   Blink Tears 0.25 % Soln Generic drug: Polyethylene Glycol 400 Place 1 drop into both eyes daily as needed (dry eyes).   carvedilol 3.125 MG tablet Commonly known as: COREG Take 1 tablet (3.125 mg total) by mouth 2 (two) times daily. Hold if sbp less than 100 or heart  rate less than 50   cefadroxil 500 MG capsule Commonly known as: DURICEF Take 1 capsule (500 mg total) by mouth 2 (two) times daily for 5 days.   celecoxib 100 MG capsule Commonly known as: CELEBREX Take 1 capsule by mouth twice a day with meals What changed: See the new instructions.   famotidine 40 MG tablet Commonly known as: PEPCID Take 1 tablet (40 mg total) by mouth as needed.   ferrous sulfate 325 (65 FE) MG EC tablet Take 1 tablet (325 mg total) by mouth every Monday, Wednesday, and Friday.   gabapentin 100 MG capsule Commonly known as: NEURONTIN Take 1 capsule (100 mg total) by mouth 3 (three) times daily.   lidocaine 2 % solution Commonly known as: XYLOCAINE Use as directed 5 mLs in the mouth or throat every 3 (three) hours as needed for mouth pain.   mouth rinse Liqd solution 15 mLs by Mouth Rinse route as needed (for oral care).   Na Sulfate-K Sulfate-Mg Sulf 17.5-3.13-1.6 GM/177ML Soln Commonly known as: Suprep Bowel Prep  Kit Take 1 kit by mouth as directed. For colonoscopy prep   nystatin-triamcinolone ointment Commonly known as: MYCOLOG 1 Topical Daily   pantoprazole 40 MG tablet Commonly known as: PROTONIX Take 1 tablet (40 mg total) by mouth daily.   Qulipta 60 MG Tabs Generic drug: Atogepant Take 1 tablet (60 mg total) by mouth daily.   rosuvastatin 5 MG tablet Commonly known as: Crestor Take 1 tablet (5 mg total) by mouth daily for 5 days.   sertraline 100 MG tablet Commonly known as: ZOLOFT Take 1 tablet (100 mg total) by mouth daily.   traMADol 50 MG tablet Commonly known as: ULTRAM Take 50 mg by mouth every 8 (eight) hours as needed.               Discharge Care Instructions  (From admission, onward)           Start     Ordered   09/07/22 0000  Discharge wound care:       Comments: Per RN   09/07/22 0804            Contact information for after-discharge care     Destination     [facility]erred SN[facility] .   Service: Skilled Nursing Contact information: [address] [place] [phone]                    Discharge Exam: Danley Danker Weights   09/04/22 [id number] 09/05/22 0400 09/06/22 1321  Weight: 60.7 kg 60.9 kg 60.9 kg        General:  AAO x 3,  cooperative, no distress;   HEENT:  Normocephalic, PERRL, otherwise with in Normal limits   Neuro:  CNII-XII intact. , normal motor and sensation, reflexes intact   Lungs:   Clear to auscultation BL, Respirations unlabored,  No wheezes / crackles  Cardio:    S1/S2, RRR, No murmure, No Rubs or Gallops   Abdomen:  Soft, non-tender, bowel sounds active all four quadrants, no guarding or peritoneal signs.  Muscular  skeletal:  Limited exam -global generalized weaknesses - in bed, able to move all 4 extremities,   2+ pulses,  symmetric, No pitting edema  Skin:  Dry, warm to touch, negative for any Rashes,  Wounds: Please see nursing documentation          Condition at discharge: good  The results of significant diagnostics from this  hospitalization (including imaging, microbiology, ancillary and laboratory) are listed below for reference.   Imaging Studies: DG ESOPHAGUS W SINGLE CM (SOL OR THIN BA)  Result Date: 09/05/2022 CLINICAL DATA:  Lower cervical/upper thoracic dysphagia for solids and pills EXAM: ESOPHOGRAM/BARIUM SWALLOW TECHNIQUE: Single contrast examination was performed using thin barium. A swallow of thick barium was also utilized. Patient swallowed a 12.5 mm diameter barium tablet as well. FLUOROSCOPY: Radiation Exposure Index (as provided by the fluoroscopic device): 50.3 mGy Kerma COMPARISON:  None Available. FINDINGS: Esophageal distention: Tapered narrowing at the lower cervical/upper thoracic esophagus. An additional area of narrowing is noted at the GE junction. Filling defects:  None 12.5 mm barium tablet: Transiently delayed at the vallecula. Tablet obstructed at the tapered area of narrowing at the lower cervical/upper thoracic esophagus. Findings are consistent with a stricture. 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 Positive spontaneous cough reflex. Hiatal hernia: Prior gastric bypass surgery. Small gastric remnant, partially above the diaphragm GE reflux:  Not witnessed during exam Other:  N/A IMPRESSION: Esophageal dysmotility. Single episode of laryngeal penetration and aspiration with positive cough reflex. Smooth tapered narrowing of the lower cervical/upper thoracic esophagus, obstructing a 12.5 mm diameter barium tablet, consistent with stricture, favor benign stricture. Additional significant narrowing is seen at the GE junction though due to proximal stricture, unable to assess patency of  the GE junction due to 12.5 mm diameter barium tablet being unable to reach the distal esophagus. Electronically Signed   By: Lavonia Dana M.D.   On: 09/05/2022 12:53   DG Chest Portable 1 View  Result Date: 09/01/2022 CLINICAL DATA:  Weakness over the past few weeks. Now unable to stand. EXAM: PORTABLE CHEST 1 VIEW COMPARISON:  08/17/2022 FINDINGS: Heart size and pulmonary vascularity are normal. Lungs are clear. No pleural effusions. No pneumothorax. Mediastinal contours appear intact. Calcified and tortuous aorta. Degenerative changes in the spine and shoulders. Surgical clips in the right upper quadrant. IMPRESSION: No active disease. Electronically Signed   By: Lucienne Capers M.D.   On: 09/01/2022 19:33   CT ABDOMEN PELVIS WO CONTRAST  Result Date: 09/01/2022 CLINICAL DATA:  Right lower quadrant pain EXAM: CT ABDOMEN AND PELVIS WITHOUT CONTRAST TECHNIQUE: Multidetector CT imaging of the abdomen and pelvis was performed following the standard protocol without IV contrast. RADIATION DOSE REDUCTION: This exam was performed according to the departmental dose-optimization program which includes automated exposure control, adjustment of the mA and/or kV according to patient size and/or use of iterative reconstruction technique. COMPARISON:  01/26/2021 FINDINGS: Lower chest: Small hiatal hernia, stable.  No acute abnormality Hepatobiliary: Prior cholecystectomy. Severe diffuse fatty infiltration of the liver. No focal suspicious hepatic abnormality. Pancreas: No focal abnormality or ductal dilatation. Spleen: No focal abnormality.  Normal size. Adrenals/Urinary Tract: No renal or adrenal mass. No stones or hydronephrosis. Urinary bladder unremarkable. Stomach/Bowel: Postoperative changes in the stomach and small bowel. Stomach, large and small bowel grossly unremarkable. Vascular/Lymphatic: Diffuse aortic atherosclerosis. No evidence of aneurysm or adenopathy. Reproductive: Uterus and adnexa unremarkable.  No  mass. Other: Trace free fluid in the cul-de-sac.  No free air. Musculoskeletal: Prior left hip replacement. No acute bony abnormality. Degenerative changes in the lumbar spine. IMPRESSION: Severe diffuse fatty infiltration of the liver. Diffuse aortic atherosclerosis. Trace free fluid in the cul-de-sac. Small hiatal hernia. Electronically Signed   By:  Rolm Baptise M.D.   On: 09/01/2022 18:06   DG Chest Port 1 View  Result Date: 08/17/2022 CLINICAL DATA:  Dizziness with vertigo. EXAM: PORTABLE CHEST 1 VIEW COMPARISON:  Radiographs 12/09/2021 and 12/01/2021.  CT 01/02/2021. FINDINGS: 1409 hours. The heart size and mediastinal contours are stable with aortic atherosclerosis and a small hiatal hernia. The bilateral airspace opacities seen on the most recent study have resolved. The lungs are now clear. There is no pleural effusion or pneumothorax. No acute osseous findings are evident. There are degenerative changes in the spine associated with a mild thoracolumbar scoliosis. Telemetry leads overlie the chest. IMPRESSION: No evidence of acute cardiopulmonary process. Interval resolution of bilateral airspace opacities. Electronically Signed   By: Richardean Sale M.D.   On: 08/17/2022 14:18    Microbiology: Results for orders placed or performed during the hospital encounter of 09/01/22  Resp panel by RT-PCR (RSV, Flu A&B, Covid) Anterior Nasal Swab     Status: None   Collection Time: 09/01/22  4:48 PM   Specimen: Anterior Nasal Swab  Result Value Ref Range Status   SARS Coronavirus 2 by RT PCR NEGATIVE NEGATIVE Final    Comment: (NOTE) SARS-CoV-2 target nucleic acids are NOT DETECTED.  The SARS-CoV-2 RNA is generally detectable in upper respiratory specimens during the acute phase of infection. The lowest concentration of SARS-CoV-2 viral copies this assay can detect is 138 copies/mL. A negative result does not preclude SARS-Cov-2 infection and should not be used as the sole basis for treatment  or other patient management decisions. A negative result may occur with  improper specimen collection/handling, submission of specimen other than nasopharyngeal swab, presence of viral mutation(s) within the areas targeted by this assay, and inadequate number of viral copies(<138 copies/mL). A negative result must be combined with clinical observations, patient history, and epidemiological information. The expected result is Negative.  Fact Sheet for Patients:  EntrepreneurPulse.com.au  Fact Sheet for Healthcare Providers:  IncredibleEmployment.be  This test is no t yet approved or cleared by the [place] FDA and  has been authorized for detection and/or diagnosis of SARS-CoV-2 by FDA under an Emergency Use Authorization (EUA). This EUA will remain  in effect (meaning this test can be used) for the duration of the COVID-19 declaration under Section 564(b)(1) of the Act, 21 U.S.C.section 360bbb-3(b)(1), unless the authorization is terminated  or revoked sooner.       Influenza A by PCR NEGATIVE NEGATIVE Final   Influenza B by PCR NEGATIVE NEGATIVE Final    Comment: (NOTE) The Xpert Xpress SARS-CoV-2/FLU/RSV plus assay is intended as an aid in the diagnosis of influenza from Nasopharyngeal swab specimens and should not be used as a sole basis for treatment. Nasal washings and aspirates are unacceptable for Xpert Xpress SARS-CoV-2/FLU/RSV testing.  Fact Sheet for Patients: EntrepreneurPulse.com.au  Fact Sheet for Healthcare Providers: IncredibleEmployment.be  This test is not yet approved or cleared by the [place] FDA and has been authorized for detection and/or diagnosis of SARS-CoV-2 by FDA under an Emergency Use Authorization (EUA). This EUA will remain in effect (meaning this test can be used) for the duration of the COVID-19 declaration under Section 564(b)(1) of the Act, 21 U.S.C. section  360bbb-3(b)(1), unless the authorization is terminated or revoked.     Resp Syncytial Virus by PCR NEGATIVE NEGATIVE Final    Comment: (NOTE) Fact Sheet for Patients: EntrepreneurPulse.com.au  Fact Sheet for Healthcare Providers: IncredibleEmployment.be  This test is not yet approved or cleared by the [place]  States FDA and has been authorized for detection and/or diagnosis of SARS-CoV-2 by FDA under an Emergency Use Authorization (EUA). This EUA will remain in effect (meaning this test can be used) for the duration of the COVID-19 declaration under Section 564(b)(1) of the Act, 21 U.S.C. section 360bbb-3(b)(1), unless the authorization is terminated or revoked.  Performed at [facility], [address], [place], [place] [address]   Urine Culture     Status: Abnormal   Collection Time: 09/01/22  6:50 PM   Specimen: Urine, Clean Catch  Result Value Ref Range Status   Specimen Description   Final    URINE, CLEAN CATCH Performed at [facility], [address], [place], [place] [address]    Special Requests   Final    NONE Performed at [facility], [address] [place], [place] [address]    Culture (A)  Final    >=100,000 COLONIES/mL ESCHERICHIA COLI >=100,000 COLONIES/mL PROTEUS MIRABILIS    Report Status 09/05/2022 FINAL  Final   Organism ID, Bacteria ESCHERICHIA COLI (A)  Final   Organism ID, Bacteria PROTEUS MIRABILIS (A)  Final      Susceptibility   Escherichia coli - MIC*    AMPICILLIN <=2 SENSITIVE Sensitive     CEFAZOLIN <=4 SENSITIVE Sensitive     CEFEPIME <=0.12 SENSITIVE Sensitive     CEFTRIAXONE <=0.25 SENSITIVE Sensitive     CIPROFLOXACIN <=0.25 SENSITIVE Sensitive     GENTAMICIN <=1 SENSITIVE Sensitive     IMIPENEM <=0.25 SENSITIVE Sensitive     NITROFURANTOIN <=16 SENSITIVE Sensitive     TRIMETH/SULFA <=20 SENSITIVE Sensitive     AMPICILLIN/SULBACTAM <=2 SENSITIVE Sensitive     PIP/TAZO <=4 SENSITIVE Sensitive     *  >=100,000 COLONIES/mL ESCHERICHIA COLI   Proteus mirabilis - MIC*    AMPICILLIN <=2 SENSITIVE Sensitive     CEFAZOLIN 8 SENSITIVE Sensitive     CEFEPIME <=0.12 SENSITIVE Sensitive     CEFTRIAXONE <=0.25 SENSITIVE Sensitive     CIPROFLOXACIN <=0.25 SENSITIVE Sensitive     GENTAMICIN <=1 SENSITIVE Sensitive     IMIPENEM 2 SENSITIVE Sensitive     NITROFURANTOIN 128 RESISTANT Resistant     TRIMETH/SULFA <=20 SENSITIVE Sensitive     AMPICILLIN/SULBACTAM <=2 SENSITIVE Sensitive     PIP/TAZO <=4 SENSITIVE Sensitive     * >=100,000 COLONIES/mL PROTEUS MIRABILIS  Culture, blood (Routine X 2) w Reflex to ID Panel     Status: None   Collection Time: 09/02/22  6:05 AM   Specimen: BLOOD RIGHT HAND  Result Value Ref Range Status   Specimen Description   Final    BLOOD RIGHT HAND BOTTLES DRAWN AEROBIC AND ANAEROBIC   Special Requests Blood Culture adequate volume  Final   Culture   Final    NO GROWTH 5 DAYS Performed at [facility], [address] [place], [place] [address]    Report Status 09/07/2022 FINAL  Final  Culture, blood (Routine X 2) w Reflex to ID Panel     Status: None   Collection Time: 09/02/22  6:05 AM   Specimen: BLOOD LEFT HAND  Result Value Ref Range Status   Specimen Description   Final    BLOOD LEFT HAND BOTTLES DRAWN AEROBIC AND ANAEROBIC   Special Requests Blood Culture adequate volume  Final   Culture   Final    NO GROWTH 5 DAYS Performed at [facility], [address], [place], [place] [address]    Report Status 09/07/2022 FINAL  Final  MRSA Next Gen by PCR, Nasal     Status: Abnormal   Collection Time: 09/04/22  7:24 PM   Specimen: Nasal Mucosa; Nasal Swab  Result Value Ref Range Status   MRSA by PCR Next Gen DETECTED (A) NOT DETECTED Final    Comment: RESULT CALLED TO, READ BACK BY AND VERIFIED WITH: BSEPTARAI,S ON 09/04/22 @ 2055 BY ACOSTA,A Performed at [facility], [address], [place], [place] [address]     Labs: CBC: Recent Labs  Lab  09/01/22 1520 09/02/22 0750 09/04/22 0328 09/05/22 0411 09/07/22 0508  WBC 18.7* 11.2* 6.1 10.1 11.7*  HGB 10.1* 7.4* 7.1* 10.0* 9.6*  HCT 31.4* 23.2* 22.9* 32.3* 31.5*  MCV 109.8* 110.5* 115.1* 99.7 98.7  PLT 223 189 137* 174 [id number]   Basic Metabolic Panel: Recent Labs  Lab 09/01/22 1520 09/02/22 0605 09/05/22 0411 09/07/22 0508  NA 131* 133* 136 134*  K 4.7 4.2 3.9 3.6  CL 97* 103 109 111  CO2 21* 21* 16* 17*  GLUCOSE 101* 72 77 71  BUN '20 19 17 17  '$ CREATININE 1.67* 1.51* 1.44* 1.39*  CALCIUM 7.5* 7.3* 7.7* 7.5*   Liver Function Tests: No results for input(s): "AST", "ALT", "ALKPHOS", "BILITOT", "PROT", "ALBUMIN" in the last 168 hours. CBG: No results for input(s): "GLUCAP" in the last 168 hours.  Discharge time spent: greater than 30 minutes.  Signed: Deatra James, MD [facility] 09/07/2022

## 2022-09-07 NOTE — Progress Notes (Signed)
Physical Therapy Treatment Patient Details Name: Heather Garrett MRN: [id number] DOB: 28-Dec-1950 Today's Date: 09/07/2022   History of Present Illness Heather Garrett is a 72 y.o. female with medical history significant for bladder cancer, diastolic CHF, diabetes mellitus, hypertension, OSA.  Patient was brought to the ED reports of generalized weakness, with dizziness ongoing over the past 6 months, but significantly worse over the past week.  She has baseline vertigo, but her dizziness is worse.  She is unable to ambulate due to generalized weakness and dizziness. She reports pain with urination that started today, she also reports lower abdominal pain. She reports poor oral intake, vomiting no loose stools.  He also reports worsening swelling to her bilateral lower extremities over the past week.  1 episode of fall when she slid off her recliner and her neighbors helped her up, otherwise no falls.  She did not lose consciousness.  She did not hit her head.    No fever no chills no chest pain no difficulty breathing.    PT Comments    Patient sleeping on therapist arrival.  Very difficult to wake her up.  Patient slow answering questions this morning. PT guides patient in supine lower extremity exercise; she needs assistance to perform and has slow reaction time today.  Max assist for bed mobility; rolling today and complaint of pain with doing so; did not attempt any further progressive mobility today secondary to lethargy, pain complaint and max assist for bed mobilty.  Patient will benefit from continued skilled therapy services during the remainder of her hospital stay and at the next recommended venue of care to address deficits and promote return to optimal function.      Recommendations for follow up therapy are one component of a multi-disciplinary discharge planning process, led by the attending physician.  Recommendations may be updated based on patient status, additional functional  criteria and insurance authorization.  Follow Up Recommendations  Skilled nursing-short term rehab (<3 hours/day) Can patient physically be transported by private vehicle: No   Assistance Recommended at Discharge Intermittent Supervision/Assistance  Patient can return home with the following A lot of help with bathing/dressing/bathroom;A lot of help with walking and/or transfers;Help with stairs or ramp for entrance;Assistance with cooking/housework   Equipment Recommendations  None recommended by PT    Recommendations for Other Services       Precautions / Restrictions Precautions Precautions: Fall Restrictions Weight Bearing Restrictions: No     Mobility  Bed Mobility Overal bed mobility: Needs Assistance Bed Mobility: Rolling     Supine to sit: Mod assist, Max assist     General bed mobility comments: increased time, labored movement; complaints of pain Patient Response:  (lethargic)  Transfers                   General transfer comment: did not attempt tranfer or ambulation today as patient needs max assist for bed mobility and is lethargic today    Ambulation/Gait                   Stairs             Wheelchair Mobility    Modified Rankin (Stroke Patients Only)       Balance Overall balance assessment: Needs assistance  Cognition Arousal/Alertness: Lethargic Behavior During Therapy: WFL for tasks assessed/performed Overall Cognitive Status: Difficult to assess                                 General Comments: patient with difficulty staying awake for therapy session today        Exercises Other Exercises Other Exercises: ankle pumps; AAROM knee flexion and SAQ's    General Comments        Pertinent Vitals/Pain Pain Assessment Pain Assessment: Faces Faces Pain Scale: Hurts even more Pain Location: BUE, BLE with pressure to skin due to fragile  skin Pain Descriptors / Indicators: Sore, Discomfort, Grimacing, Guarding Pain Intervention(s): Limited activity within patient's tolerance, Monitored during session    Home Living                          Prior Function            PT Goals (current goals can now be found in the care plan section) Acute Rehab PT Goals Patient Stated Goal: return home after rehab PT Goal Formulation: With patient Time For Goal Achievement: 09/16/22 Potential to Achieve Goals: Good Progress towards PT goals: Progressing toward goals    Frequency    Min 3X/week      PT Plan Current plan remains appropriate    Co-evaluation              AM-PAC PT "6 Clicks" Mobility   Outcome Measure  Help needed turning from your back to your side while in a flat bed without using bedrails?: A Lot Help needed moving from lying on your back to sitting on the side of a flat bed without using bedrails?: A Lot Help needed moving to and from a bed to a chair (including a wheelchair)?: A Lot Help needed standing up from a chair using your arms (e.g., wheelchair or bedside chair)?: A Lot Help needed to walk in hospital room?: Total Help needed climbing 3-5 steps with a railing? : Total 6 Click Score: 10    End of Session   Activity Tolerance: Patient limited by lethargy;Patient limited by pain Patient left: with bed alarm set;in bed   PT Visit Diagnosis: Unsteadiness on feet (R26.81);Other abnormalities of gait and mobility (R26.89);Muscle weakness (generalized) (M62.81)     Time: [id number] PT Time Calculation (min) (ACUTE ONLY): 15 min  Charges:  $Therapeutic Activity: 8-22 mins                     9:17 AM, 09/07/22 Lashay Osborne Small Kier Smead MPT [facility] [place] [phone] [id number]

## 2022-09-07 NOTE — Care Management Important Message (Signed)
Important Message  Patient Details  Name: Heather Garrett MRN: [id number] Date of Birth: 1951-03-26   Medicare Important Message Given:  Yes     Tommy Medal 09/07/2022, 12:03 PM

## 2022-09-07 NOTE — Progress Notes (Signed)
Brief note. Plans for discharge of patient today from hospital. EGD yesterday with benign appearing esophageal stenosis, dilated. Should continue to take pantoprazole '40mg'$  BID, plan for repeat EGD in 2-3 weeks for dilation which is scheduled 10/10/22. Will have our team schedule outpatient hospital follow up in 2-3 weeks.

## 2022-09-08 ENCOUNTER — Other Ambulatory Visit: Payer: Self-pay | Admitting: Family Medicine

## 2022-09-08 ENCOUNTER — Encounter (HOSPITAL_COMMUNITY): Payer: Medicare PPO

## 2022-09-08 ENCOUNTER — Other Ambulatory Visit: Payer: Self-pay

## 2022-09-08 ENCOUNTER — Encounter: Payer: Self-pay | Admitting: Radiology

## 2022-09-08 DIAGNOSIS — I959 Hypotension, unspecified: Secondary | ICD-10-CM | POA: Diagnosis not present

## 2022-09-08 DIAGNOSIS — R55 Syncope and collapse: Secondary | ICD-10-CM | POA: Diagnosis not present

## 2022-09-08 DIAGNOSIS — S37099A Other injury of unspecified kidney, initial encounter: Secondary | ICD-10-CM | POA: Diagnosis not present

## 2022-09-08 DIAGNOSIS — N390 Urinary tract infection, site not specified: Secondary | ICD-10-CM | POA: Diagnosis not present

## 2022-09-08 DIAGNOSIS — R0689 Other abnormalities of breathing: Secondary | ICD-10-CM | POA: Diagnosis not present

## 2022-09-08 DIAGNOSIS — R1310 Dysphagia, unspecified: Secondary | ICD-10-CM | POA: Diagnosis not present

## 2022-09-08 DIAGNOSIS — R531 Weakness: Secondary | ICD-10-CM | POA: Diagnosis not present

## 2022-09-08 DIAGNOSIS — I469 Cardiac arrest, cause unspecified: Secondary | ICD-10-CM | POA: Diagnosis not present

## 2022-09-09 ENCOUNTER — Ambulatory Visit: Payer: Medicare PPO | Admitting: Family Medicine

## 2022-09-09 DEATH — deceased

## 2022-09-12 ENCOUNTER — Telehealth: Payer: Self-pay | Admitting: Family Medicine

## 2022-09-12 NOTE — Telephone Encounter (Signed)
I called and no response

## 2022-09-12 NOTE — Telephone Encounter (Signed)
Please call Pricilla Loveless at [facility] in [place].  [phone] - Patient passed away and they are asking if they can send you the death cert in [place].  Please call Rip Harbour to follow up with her directly.

## 2022-09-13 ENCOUNTER — Encounter (HOSPITAL_COMMUNITY): Payer: Medicare PPO

## 2022-09-14 ENCOUNTER — Encounter (HOSPITAL_COMMUNITY): Payer: Self-pay | Admitting: Gastroenterology

## 2022-09-14 ENCOUNTER — Other Ambulatory Visit (HOSPITAL_COMMUNITY): Payer: Medicare PPO

## 2022-09-20 ENCOUNTER — Other Ambulatory Visit (HOSPITAL_COMMUNITY): Payer: Medicare PPO

## 2022-09-28 ENCOUNTER — Ambulatory Visit: Payer: Medicare PPO | Admitting: Gastroenterology

## 2022-09-29 ENCOUNTER — Ambulatory Visit: Payer: Medicare PPO | Admitting: Cardiology

## 2022-10-10 ENCOUNTER — Ambulatory Visit (HOSPITAL_COMMUNITY): Admit: 2022-10-10 | Payer: Medicare PPO | Admitting: Gastroenterology

## 2022-10-10 SURGERY — ESOPHAGOGASTRODUODENOSCOPY (EGD) WITH PROPOFOL
Anesthesia: Monitor Anesthesia Care

## 2023-02-16 ENCOUNTER — Ambulatory Visit: Payer: Medicare PPO | Admitting: Adult Health
